# Patient Record
Sex: Female | Born: 1935 | Race: Black or African American | Hispanic: No | Smoking: Former smoker
Health system: Southern US, Community
[De-identification: ages and names within clinical notes are randomized; demographics above are authoritative.]

## PROBLEM LIST (undated history)

## (undated) DIAGNOSIS — K219 Gastro-esophageal reflux disease without esophagitis: Secondary | ICD-10-CM

## (undated) DIAGNOSIS — L8990 Pressure ulcer of unspecified site, unspecified stage: Secondary | ICD-10-CM

## (undated) DIAGNOSIS — Y92009 Unspecified place in unspecified non-institutional (private) residence as the place of occurrence of the external cause: Secondary | ICD-10-CM

## (undated) DIAGNOSIS — E119 Type 2 diabetes mellitus without complications: Secondary | ICD-10-CM

## (undated) DIAGNOSIS — K9189 Other postprocedural complications and disorders of digestive system: Secondary | ICD-10-CM

## (undated) DIAGNOSIS — E1165 Type 2 diabetes mellitus with hyperglycemia: Secondary | ICD-10-CM

## (undated) DIAGNOSIS — Z79899 Other long term (current) drug therapy: Secondary | ICD-10-CM

## (undated) DIAGNOSIS — K209 Esophagitis, unspecified: Secondary | ICD-10-CM

## (undated) DIAGNOSIS — B372 Candidiasis of skin and nail: Secondary | ICD-10-CM

## (undated) DIAGNOSIS — W19XXXA Unspecified fall, initial encounter: Secondary | ICD-10-CM

## (undated) DIAGNOSIS — I5032 Chronic diastolic (congestive) heart failure: Secondary | ICD-10-CM

## (undated) DIAGNOSIS — K567 Ileus, unspecified: Secondary | ICD-10-CM

## (undated) DIAGNOSIS — I10 Essential (primary) hypertension: Secondary | ICD-10-CM

## (undated) DIAGNOSIS — D500 Iron deficiency anemia secondary to blood loss (chronic): Secondary | ICD-10-CM

## (undated) DIAGNOSIS — R52 Pain, unspecified: Secondary | ICD-10-CM

## (undated) DIAGNOSIS — E785 Hyperlipidemia, unspecified: Secondary | ICD-10-CM

## (undated) DIAGNOSIS — Z7901 Long term (current) use of anticoagulants: Secondary | ICD-10-CM

## (undated) DIAGNOSIS — I4891 Unspecified atrial fibrillation: Secondary | ICD-10-CM

## (undated) DIAGNOSIS — N289 Disorder of kidney and ureter, unspecified: Secondary | ICD-10-CM

## (undated) DIAGNOSIS — E1129 Type 2 diabetes mellitus with other diabetic kidney complication: Secondary | ICD-10-CM

## (undated) DIAGNOSIS — I499 Cardiac arrhythmia, unspecified: Secondary | ICD-10-CM

## (undated) DIAGNOSIS — N184 Chronic kidney disease, stage 4 (severe): Secondary | ICD-10-CM

## (undated) DIAGNOSIS — K420 Umbilical hernia with obstruction, without gangrene: Secondary | ICD-10-CM

## (undated) HISTORY — DX: Umbilical hernia with obstruction, without gangrene: K42.0

## (undated) HISTORY — DX: Pressure ulcer of unspecified site, unspecified stage: L89.90

## (undated) HISTORY — DX: Chronic diastolic (congestive) heart failure: I50.32

## (undated) HISTORY — DX: Pain, unspecified: R52

## (undated) HISTORY — DX: Gastro-esophageal reflux disease without esophagitis: K21.9

## (undated) HISTORY — DX: Esophagitis, unspecified: K20.9

## (undated) HISTORY — DX: Candidiasis of skin and nail: B37.2

## (undated) HISTORY — PX: OTHER SURGICAL HISTORY: SHX169

## (undated) HISTORY — DX: Other long term (current) drug therapy: Z79.899

## (undated) HISTORY — DX: Iron deficiency anemia secondary to blood loss (chronic): D50.0

## (undated) HISTORY — DX: Unspecified place in unspecified non-institutional (private) residence as the place of occurrence of the external cause: Y92.009

## (undated) HISTORY — PX: HERNIA REPAIR: SHX51

## (undated) HISTORY — DX: Long term (current) use of anticoagulants: Z79.01

## (undated) HISTORY — DX: Essential (primary) hypertension: I10

## (undated) HISTORY — DX: Chronic kidney disease, stage 4 (severe): N18.4

## (undated) HISTORY — DX: Hyperlipidemia, unspecified: E78.5

## (undated) HISTORY — DX: Unspecified fall, initial encounter: W19.XXXA

## (undated) HISTORY — PX: CATARACT EXTRACTION, BILATERAL: SHX1313

## (undated) HISTORY — DX: Type 2 diabetes mellitus with other diabetic kidney complication: E11.29

## (undated) HISTORY — DX: Type 2 diabetes mellitus with hyperglycemia: E11.65

## (undated) HISTORY — DX: Unspecified atrial fibrillation: I48.91

---

## 1997-05-08 ENCOUNTER — Ambulatory Visit (HOSPITAL_COMMUNITY): Admission: RE | Admit: 1997-05-08 | Discharge: 1997-05-08 | Payer: Self-pay | Admitting: Family Medicine

## 1998-05-24 ENCOUNTER — Encounter: Payer: Self-pay | Admitting: Family Medicine

## 1998-05-24 ENCOUNTER — Ambulatory Visit (HOSPITAL_COMMUNITY): Admission: RE | Admit: 1998-05-24 | Discharge: 1998-05-24 | Payer: Self-pay | Admitting: Family Medicine

## 1998-07-03 ENCOUNTER — Other Ambulatory Visit: Admission: RE | Admit: 1998-07-03 | Discharge: 1998-07-03 | Payer: Self-pay | Admitting: Obstetrics and Gynecology

## 1999-01-03 ENCOUNTER — Ambulatory Visit: Admission: RE | Admit: 1999-01-03 | Discharge: 1999-01-03 | Payer: Self-pay | Admitting: Family Medicine

## 1999-03-25 HISTORY — PX: BREAST LUMPECTOMY: SHX2

## 1999-06-26 ENCOUNTER — Ambulatory Visit (HOSPITAL_COMMUNITY): Admission: RE | Admit: 1999-06-26 | Discharge: 1999-06-26 | Payer: Self-pay | Admitting: Family Medicine

## 1999-06-26 ENCOUNTER — Encounter: Payer: Self-pay | Admitting: Family Medicine

## 1999-07-01 ENCOUNTER — Encounter: Payer: Self-pay | Admitting: Family Medicine

## 1999-07-01 ENCOUNTER — Encounter: Admission: RE | Admit: 1999-07-01 | Discharge: 1999-07-01 | Payer: Self-pay | Admitting: Family Medicine

## 1999-08-06 ENCOUNTER — Encounter (INDEPENDENT_AMBULATORY_CARE_PROVIDER_SITE_OTHER): Payer: Self-pay | Admitting: *Deleted

## 1999-08-06 ENCOUNTER — Encounter: Admission: RE | Admit: 1999-08-06 | Discharge: 1999-08-06 | Payer: Self-pay | Admitting: General Surgery

## 1999-08-06 ENCOUNTER — Encounter (HOSPITAL_BASED_OUTPATIENT_CLINIC_OR_DEPARTMENT_OTHER): Payer: Self-pay | Admitting: General Surgery

## 1999-08-06 ENCOUNTER — Other Ambulatory Visit: Admission: RE | Admit: 1999-08-06 | Discharge: 1999-08-06 | Payer: Self-pay | Admitting: General Surgery

## 1999-08-20 ENCOUNTER — Encounter (HOSPITAL_BASED_OUTPATIENT_CLINIC_OR_DEPARTMENT_OTHER): Payer: Self-pay | Admitting: General Surgery

## 1999-08-22 ENCOUNTER — Ambulatory Visit (HOSPITAL_COMMUNITY): Admission: RE | Admit: 1999-08-22 | Discharge: 1999-08-22 | Payer: Self-pay | Admitting: General Surgery

## 1999-08-22 ENCOUNTER — Encounter (HOSPITAL_BASED_OUTPATIENT_CLINIC_OR_DEPARTMENT_OTHER): Payer: Self-pay | Admitting: General Surgery

## 1999-08-22 ENCOUNTER — Encounter (INDEPENDENT_AMBULATORY_CARE_PROVIDER_SITE_OTHER): Payer: Self-pay | Admitting: *Deleted

## 1999-10-02 ENCOUNTER — Encounter: Admission: RE | Admit: 1999-10-02 | Discharge: 1999-12-31 | Payer: Self-pay | Admitting: Radiation Oncology

## 2000-08-14 ENCOUNTER — Encounter (HOSPITAL_BASED_OUTPATIENT_CLINIC_OR_DEPARTMENT_OTHER): Payer: Self-pay | Admitting: General Surgery

## 2000-08-14 ENCOUNTER — Encounter: Admission: RE | Admit: 2000-08-14 | Discharge: 2000-08-14 | Payer: Self-pay | Admitting: General Surgery

## 2000-09-21 ENCOUNTER — Other Ambulatory Visit: Admission: RE | Admit: 2000-09-21 | Discharge: 2000-09-21 | Payer: Self-pay | Admitting: Obstetrics and Gynecology

## 2000-10-26 ENCOUNTER — Encounter (HOSPITAL_BASED_OUTPATIENT_CLINIC_OR_DEPARTMENT_OTHER): Payer: Self-pay | Admitting: General Surgery

## 2000-10-28 ENCOUNTER — Encounter (INDEPENDENT_AMBULATORY_CARE_PROVIDER_SITE_OTHER): Payer: Self-pay | Admitting: *Deleted

## 2000-10-28 ENCOUNTER — Ambulatory Visit (HOSPITAL_COMMUNITY): Admission: RE | Admit: 2000-10-28 | Discharge: 2000-10-28 | Payer: Self-pay | Admitting: General Surgery

## 2001-09-21 ENCOUNTER — Encounter (HOSPITAL_BASED_OUTPATIENT_CLINIC_OR_DEPARTMENT_OTHER): Payer: Self-pay | Admitting: General Surgery

## 2001-09-21 ENCOUNTER — Encounter: Admission: RE | Admit: 2001-09-21 | Discharge: 2001-09-21 | Payer: Self-pay | Admitting: General Surgery

## 2001-10-06 ENCOUNTER — Other Ambulatory Visit: Admission: RE | Admit: 2001-10-06 | Discharge: 2001-10-06 | Payer: Self-pay | Admitting: Obstetrics and Gynecology

## 2002-11-01 ENCOUNTER — Encounter (HOSPITAL_BASED_OUTPATIENT_CLINIC_OR_DEPARTMENT_OTHER): Payer: Self-pay | Admitting: General Surgery

## 2002-11-01 ENCOUNTER — Encounter: Admission: RE | Admit: 2002-11-01 | Discharge: 2002-11-01 | Payer: Self-pay | Admitting: General Surgery

## 2003-02-13 ENCOUNTER — Ambulatory Visit (HOSPITAL_COMMUNITY): Admission: RE | Admit: 2003-02-13 | Discharge: 2003-02-14 | Payer: Self-pay | Admitting: Gastroenterology

## 2003-02-14 ENCOUNTER — Encounter (INDEPENDENT_AMBULATORY_CARE_PROVIDER_SITE_OTHER): Payer: Self-pay | Admitting: *Deleted

## 2003-12-08 ENCOUNTER — Encounter: Admission: RE | Admit: 2003-12-08 | Discharge: 2003-12-08 | Payer: Self-pay | Admitting: General Surgery

## 2004-01-12 ENCOUNTER — Encounter (HOSPITAL_BASED_OUTPATIENT_CLINIC_OR_DEPARTMENT_OTHER): Admission: RE | Admit: 2004-01-12 | Discharge: 2004-01-23 | Payer: Self-pay | Admitting: Internal Medicine

## 2004-01-26 ENCOUNTER — Inpatient Hospital Stay (HOSPITAL_COMMUNITY): Admission: EM | Admit: 2004-01-26 | Discharge: 2004-01-29 | Payer: Self-pay | Admitting: Internal Medicine

## 2004-03-13 ENCOUNTER — Other Ambulatory Visit: Admission: RE | Admit: 2004-03-13 | Discharge: 2004-03-13 | Payer: Self-pay | Admitting: Obstetrics and Gynecology

## 2004-04-16 ENCOUNTER — Encounter (HOSPITAL_BASED_OUTPATIENT_CLINIC_OR_DEPARTMENT_OTHER): Admission: RE | Admit: 2004-04-16 | Discharge: 2004-05-08 | Payer: Self-pay | Admitting: Internal Medicine

## 2004-12-25 ENCOUNTER — Encounter: Admission: RE | Admit: 2004-12-25 | Discharge: 2004-12-25 | Payer: Self-pay | Admitting: Family Medicine

## 2005-12-29 ENCOUNTER — Encounter: Admission: RE | Admit: 2005-12-29 | Discharge: 2005-12-29 | Payer: Self-pay | Admitting: Family Medicine

## 2007-01-04 ENCOUNTER — Encounter: Admission: RE | Admit: 2007-01-04 | Discharge: 2007-01-04 | Payer: Self-pay | Admitting: General Surgery

## 2008-01-05 ENCOUNTER — Encounter: Admission: RE | Admit: 2008-01-05 | Discharge: 2008-01-05 | Payer: Self-pay | Admitting: Family Medicine

## 2008-06-22 ENCOUNTER — Encounter: Admission: RE | Admit: 2008-06-22 | Discharge: 2008-06-22 | Payer: Self-pay | Admitting: Gastroenterology

## 2008-09-15 ENCOUNTER — Ambulatory Visit (HOSPITAL_COMMUNITY): Admission: RE | Admit: 2008-09-15 | Discharge: 2008-09-15 | Payer: Self-pay | Admitting: Gastroenterology

## 2009-01-30 ENCOUNTER — Encounter: Admission: RE | Admit: 2009-01-30 | Discharge: 2009-01-30 | Payer: Self-pay | Admitting: Family Medicine

## 2010-02-05 ENCOUNTER — Encounter: Admission: RE | Admit: 2010-02-05 | Discharge: 2010-02-05 | Payer: Self-pay | Admitting: Family Medicine

## 2010-02-18 ENCOUNTER — Encounter: Admission: RE | Admit: 2010-02-18 | Discharge: 2010-02-18 | Payer: Self-pay | Admitting: Family Medicine

## 2010-08-09 NOTE — Discharge Summary (Signed)
[name], [name]                ACCOUNT NO.:  [id number]   MEDICAL RECORD NO.:  [id number]          PATIENT TYPE:  INP   LOCATION:  [id number]                         FACILITY:  [facility]   PHYSICIAN:  Annita Brod, M.D.DATE OF BIRTH:  [birth year]   DATE OF ADMISSION:  01/26/2004  DATE OF DISCHARGE:  01/29/2004                                 DISCHARGE SUMMARY   PRIMARY CARE PHYSICIAN:  Russ Halo. Sheryn Bison, M.D.   DISCHARGE DIAGNOSES:  1.  Cellulitis of the left foot and ankle.  2.  History of iron-deficiency anemia.  3.  History of left-sided diverticula.  4.  Hypertension.  5.  History of breast cancer.  6.  History of supraventricular tachycardia.  7.  History of diabetes.  8.  History of chronic obstructive pulmonary disease.   DISCHARGE MEDICATIONS:  Patient is being discharged on Augmentin 500 mg p.o.  b.i.d. x6 more days.  In addition, he is to continue all of his previous  medicines.  Precose 100 mg p.o. t.i.d.  Glucophage 500 mg 2 pills p.o.  t.i.d.  Glucotrol XL 10 mg p.o. daily.  Avandia 4 mg p.o. b.i.d.  Lipitor 40  mg p.o. daily.  Norvasc 10 mg p.o. daily.  Toprol daily.  K-Lor 20 mEq p.o.  b.i.d.  Lasix 80 mg p.o. b.i.d.  Evista 60 mg p.o. daily.  Aspirin 81 mg  p.o. daily.  Nexium 40 mg p.o. daily.  Gaviscon p.r.n.  He is also on a  multivitamin.   HOSPITAL COURSE:  Patient is a 77 year old female who presents with right  lower extremity swelling, fevers, chills, and redness for the past three  days.  Treatment with p.o. antibiotics was attempted but she showed no signs  of improvement and was admitted directly for treatment of inpatient IV  antibiotics.  By the second day, the patient was feeling better with her  foot propped up.  She had no pain.   Her initial white count on admission was only 10.6, but it decreased down to  8 by her third hospital day.  Her cellulitis was difficult to see, given her  dark skin, but she was started on IV Zosyn.  In addition, she  otherwise  remained stable and afebrile.  She had minimal tenderness, and by the third  day, she was doing well.  At that time, we got her out of bed and tried to  ambulate her.  She was able to ambulate relatively well.  She continued to  complain of some lower extremity swelling, and lower extremity Dopplers were  checked on January 29, 2004, which were negative for DVT.   By November 7, she was doing well, afebrile.  Her foot was still swollen but  less tender, and she was able to ambulate.  Once her Dopplers were done, it  was found to be negative, and she was discharged to home.  In regards to her  diabetes, her blood sugars were followed.  She is to continue on her p.o.  medications, although she remained n.p.o. during her hospital stay.  Blood  sugars were slightly elevated  but stayed under 200.  In regards to her  hypertension, this remained stable as well.  Chest x-ray was clear, as was  the lung exam, so it was felt that she did not have any CHF and no echo was  ordered, as originally planned in her H&P.  Patient was discharged on  Augmentin 500 mg p.o. b.i.d. x6 more days on top of the IV Zosyn she  received for three days.   PLAN:  Activity as tolerated.  She will be discharged on a low sodium  diabetic diet.  Patient will follow up with Dr. Sheryn Bison in one week to see  if she is having improvement.  If not, she may need to be restarted on IV  antibiotics at this time for a continuous treatment of antibiotics alone.     Send   SKK/MEDQ  D:  02/23/2004  T:  02/25/2004  Job:  [id number]

## 2010-08-09 NOTE — Op Note (Signed)
. [facility]  Patient:    [name], [name]                       MRN: [id number] Proc. Date: 08/22/99 Adm. Date:  [id number] Disc. Date: [id number] Attending:  Sherolyn Buba CC:         Candee Furbish. Bubba Camp, M.D. (2)                           Operative Report  PREOPERATIVE DIAGNOSIS:  Carcinoma, right breast.  POSTOPERATIVE DIAGNOSIS:  Carcinoma, right breast.  PROCEDURE:  Lumpectomy with sentinel lymph node dissection.  SURGEON:  Candee Furbish. Bubba Camp, M.D.  ASSISTANT:  Courtney Paris, PA-student.  ANESTHESIA:  General  INDICATIONS:  This patient is a 77 year old woman presenting with an abnormal lesion on her right mammogram. 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  The patient was prepared, brought to the operating room after a needle localization had been done, and the site injected with radioactive dye.  DESCRIPTION OF PROCEDURE:  Following the induction of satisfactory anesthesia, he patient was positioned supinely and the right breast was injected with 10 cc of  Lymphazurin and massaged for approximately 5-8 minutes.  The breast was prepped and draped to be included in the sterile operative field.  I made an elliptical incision around the localizing needle, deepened this through skin and subcutaneous tissues, taking a wide conical biopsy all the way down to the pectoralis major muscle. The tissue was removed in its entirely in four different pathologic evaluation.  Touch preps on the margins showed no evidence of carcinoma.  Then using the Neoprobe, I located a high count spot within the axilla; this was located just approximately at the edge of the axillary hairline. A transverse axillary incision was made, deepened through the skin and subcutaneous tissue, acute dissection carried down with direction being guided by the direction of the neoprobe.   Blue lymphatics were traced down to two large blue lymph nodes which  were dissected free and noted to have high counts, approximating 1200 counts/second.  This was removed in four different pathologic evaluations. Touch preps on these two sentinel nodes showed no evidence of carcinoma.  The wounds ere then checked for hemostasis and noted to be dry.  Sponge, instrument and sharp counts were verified.  In the axilla and in the breast wound, the subcutaneous tissues were closed with interrupted sutures of 3-0 Vicryl and both wounds were  closed with a running subcuticular stitch of 4-0 Monocryl.  The wounds were reinforced with Steri-Strips, sterile dressings applied, anesthetic reversed. The patient was removed from the operating room to the recovery room in stable condition, having tolerated the procedure well. DD:  08/22/99 TD:  08/26/99 Job: [id number]

## 2010-08-09 NOTE — Consult Note (Signed)
NAME:  [name], [name] NO.:  [id number]   MEDICAL RECORD NO.:  [id number]                   PATIENT TYPE:  OIB   LOCATION:  3705                                 FACILITY:  [facility]   PHYSICIAN:  Sinclair Grooms, M.D.            DATE OF BIRTH:  08-17-1933   DATE OF CONSULTATION:  DATE OF DISCHARGE:                                   CONSULTATION   REASON FOR CONSULTATION:  Tachycardia.   CONCLUSION:  1. Supraventricular tachycardia secondary to withholding beta blocker     therapy and intrinsic tendency for SVT in this patient. She has a prior     history of such.  2. Hypotension secondary to tachycardia and bowel prep for a colonoscopy.  3. History of hypertension.  4. Chest discomfort, dyspnea, diaphoresis, secondary to number one and     number two above.   PLAN:  1. IV Adenocard 6 mg given with reversion of the rhythm to normal sinus     rhythm.  2. Immediately resume the patient's beta blocker, Toprol XL 100 mg per day.  3. Check electrolytes and cardiac enzymes.  4. If all above remain stable and are treated it is okay to proceed with     endoscopy within the next 24 hours.   COMMENTS:  The patient is 89. I have known her for a number of years and  have treated her for her supraventricular tachycardia. She has hypertensive  heart disease. She has no known history of coronary artery disease. Walked  into the hospital this morning in preparation for a colonoscopy and upper GI  endoscopy. She suddenly developed weakness and tachy palpitations associated  with chest discomfort. Upon arriving in the endoscopy suite her heart rate  was greater than 160 and EKG demonstrated PSVT. In speaking with the patient  she has been doing her endoscopy/colonoscopy prep for the last 24 hours and  did not have her medications last evening or this morning. She has had  similar sensation/symptomatology in the past when she has had PSVT.   Her current medical  regimen includes Precose, Doxazosin, Evista, Fosamax,  Nexium, Lipitor, Nifedipine, Glucotrol, Avandia, Toprol XL 100 mg per day,  Gaviscon, Chlorocon, Furosemide, and aspirin.   PHYSICAL EXAMINATION:  VITAL SIGNS:  Blood pressure 90/60, heart rate 170.  NECK:  No carotid bruit.  CHEST:  Clear.  CARDIAC:  No murmur, no gallop, tachy rhythm.  EXTREMITIES:  No edema.  NEUROLOGIC:  The patient was alert and oriented.  SKIN:  Diaphoretic.   LABORATORY DATA:  EKG reveals PSVT with marked ST-T abnormality, heart rate  of 170.   PROCEDURE:  Initially carotid massage on the right was attempted. This  slightly decreased her heart rate, but did not break the arrhythmia. We then  administered 6 mg of Adenocard IV with reversion to normal sinus rhythm  within  3 minutes of the bolus. There were  initial PVCs with some junctional escape.  The patient was asymptomatic with this. The ST-T abnormalities noted on the  EKG when the was in tachycardia completely resolved. Laboratory data  including electrolytes are cardiac enzymes are pending.                                               Sinclair Grooms, M.D.    HWS/MEDQ  D:  02/14/2003  T:  02/14/2003  Job:  [id number]   cc:   Jeryl Columbia, M.D.  [address] [place]  [place] [address]  Fax: [phone]

## 2010-08-09 NOTE — Op Note (Signed)
NAME:  [name], [name]                          ACCOUNT NO.:  [id number]   MEDICAL RECORD NO.:  [id number]                   PATIENT TYPE:  OIB   LOCATION:  3705                                 FACILITY:  [facility]   PHYSICIAN:  Jeryl Columbia, M.D.                 DATE OF BIRTH:  Apr 22, 1933   DATE OF PROCEDURE:  02/14/2003  DATE OF DISCHARGE:                                 OPERATIVE REPORT   PROCEDURE PERFORMED:  Colonoscopy.   ENDOSCOPIST:  Jeryl Columbia, M.D.   INDICATIONS FOR PROCEDURE:  Patient with iron deficiency anemia.  Consent  was signed after the risks, benefits, methods and options were thoroughly  discussed in the office.   MEDICINES USED:  Demerol 70 mg, Versed 7 mg.   DESCRIPTION OF PROCEDURE:  Rectal inspection was pertinent for external  hemorrhoids, small.  Digital exam was negative.  A video pediatric  adjustable colonoscope was inserted and with lots of difficulty due to a  long looping colon and only a fair prep was finally able to be advanced to  the cecum.  This did require rolling her on her back and then on her right  side and then back to her left side again, various abdominal pressures.  On  insertion, left-sided diverticula were seen.  The prep was only fair.  We  frequently clogged the scope and dirtied the lens and had to spend a great  deal of time cleaning those off.  The cecum was identified by the  appendiceal orifice and the ileocecal valve. There was some stool adherent  to the wall, more on the right than on the left, but no underlying obvious  large lesions.  Certainly small polyps with this prep could have been  missed.  The scope was slowly withdrawn.  With lots of washing and  suctioning, other than the left-sided diverticula, no signs of bleeding or  other abnormalities were seen as we slowly withdrew back to the rectum.  Anorectal pullthrough and retroflexion confirmed the hemorrhoids.  Some were  a little inflamed from the procedure.  Scope  was reinserted a short ways up  the left side of the colon, air was suctioned, scope removed.  The patient  tolerated the procedure well.  There was no immediate obvious complication.   ENDOSCOPIC DIAGNOSIS:  1. Internal and external hemorrhoids.  2. Left-sided diverticula.  3. Fair prep, long looping colon.  4. Otherwise within normal limits to the cecum; however, with a fair prep,     small lesions could be missed.   PLAN:  More aggressive prep next time, probably increase her potassium as  well.  Probably use a regular scope.  Continue work-up with an EGD.  Jeryl Columbia, M.D.    MEM/MEDQ  D:  02/14/2003  T:  02/14/2003  Job:  [id number]   cc:   Sinclair Grooms, M.D.  [address] [place] [employer]  [place]  [place] [address]  Fax: [place]. Sheryn Bison, M.D.  [address]  [place]  [place] [address]  Fax: [phone]

## 2010-08-09 NOTE — Op Note (Signed)
NAME:  DERICK, AVITIA                          ACCOUNT NO.:  [id number]   MEDICAL RECORD NO.:  [id number]                   PATIENT TYPE:  OIB   LOCATION:  3705                                 FACILITY:  [facility]   PHYSICIAN:  Jeryl Columbia, M.D.                 DATE OF BIRTH:  [birth year]   DATE OF PROCEDURE:  02/14/2003  DATE OF DISCHARGE:                                 OPERATIVE REPORT   PROCEDURE:  Esophagogastroduodenoscopy with biopsy.   ENDOSCOPIST:  Jeryl Columbia, M.D.   INDICATIONS:  Upper tract symptoms, iron deficiency, nondiagnostic  colonoscopy.   INFORMED CONSENT:  Consent was signed prior to any premedications given  after risks, benefits, methods and options were thoroughly discussed in the  office.   MEDICATIONS USED:  Additional medicines for this procedure were Demerol 30  mg, Versed 3 mg.   DESCRIPTION OF PROCEDURE:  The video endoscope was inserted by direct  vision.  The esophagus were normal.  The scope was passed into the stomach  and into a normal antrum and normal pylorus to a normal duodenal bulb around  the C-loop to the second portion of the duodenum where some minimal  duodenitis was seen and a few biopsies were obtained.  The scope was  withdrawn back to the bulb which was normal.  The scope was withdrawn back  to the stomach and retroflexed.  Cardia, fundus, angularis, lesser and  greater curve were all normal.  Straight visualization of the stomach was  normal.  The scope was then slowly withdrawn after air was suctioned.  She  did have a tiny hiatal hernia but no other abnormalities.  The scope was  removed.  The patient tolerated the procedure well.  There was no obvious  immediate complication.   ENDOSCOPIC DIAGNOSES:  1. Tiny hiatal hernia.  2. Minimal duodenitis status post biopsy.  3. Otherwise normal esophagogastroduodenoscopy.   PLAN:  1. Await pathology to rule out malabsorption.  2. Continue pump inhibitors.  3. Follow up in 6-8  weeks to recheck symptoms, CBC, guaiacs and make sure no     other workup plans are needed.  4. Will suggest one over-the-counter iron a day in the meantime.  5. Call me sooner p.r.n.                                               Jeryl Columbia, M.D.    MEM/MEDQ  D:  02/14/2003  T:  02/14/2003  Job:  [id number]

## 2010-08-09 NOTE — Op Note (Signed)
[facility]  Patient:    RUQAYYAH, CORBIT                       MRN: [id number] Proc. Date: 10/28/00 Adm. Date:  [id number] Disc. Date: [id number] Attending:  Sherolyn Buba                           Operative Report  PREOPERATIVE DIAGNOSIS:  Incarcerated umbilical hernia.  POSTOPERATIVE DIAGNOSIS:  Incarcerated umbilical hernia.  OPERATION PERFORMED:  Repair of incarcerated umbilical hernia with mesh.  SURGEON:  Candee Furbish. Bubba Camp, M.D.  ASSISTANT:  Maia Plan. Lindon Romp, M.D.  ANESTHESIA:  General.  INDICATIONS FOR PROCEDURE:  The patient is a 77 year old female presenting with a painful large incarcerated mass within her umbilicus. She is brought to the operating room now after risks and benefits of surgery had been discussed and she gives consent.  DESCRIPTION OF PROCEDURE:  Following the induction of satisfactory anesthesia with the patient positioned supinely, the abdomen was routinely prepped and draped to be included in the sterile operative field.  A superumbilical curvilinear incision was made, deepened through the skin and subcutaneous tissues down through the hernial sac.  The hernia sac was dissected free from the overlying umbilical skin carrying the dissection down to the fascia.  The hernia was in fact bilobar with a fascial bridge across it which was taken down.  The hernia sac was opened and the incarcerated content reduced after it was inspected and noted not to be ischemic.  The sac was then amputated in its entirety and forwarded for pathologic evaluation.  The large defect was then closed with a medium mesh plug which was sewn in in the interrupted fashion around the entire fascial edge.  Repair was noted to be intact.  Sponge, instrument and sharp counts were verified.  Subcutaneous tissues were closed over the mesh with interrupted 2-0 Vicryl sutures.  The umbilical skin was tacked down to the base of the wound and was  again closed with a running 5-0 Monocryl suture and then reinforced with Steri-Strips.  Sterile dressings applied.  Anesthetic reversed.  Patient removed from the operating room to the recovery room in stable condition having tolerated the procedure well. DD:  10/28/00 TD:  10/28/00 Job: [id number]

## 2010-11-15 ENCOUNTER — Emergency Department (HOSPITAL_COMMUNITY)
Admission: EM | Admit: 2010-11-15 | Discharge: 2010-11-15 | Disposition: A | Discharge status: Home / Self Care | Payer: Medicare Other | Attending: Emergency Medicine | Admitting: Emergency Medicine

## 2010-11-15 ENCOUNTER — Other Ambulatory Visit: Payer: Self-pay | Admitting: Family Medicine

## 2010-11-15 DIAGNOSIS — E119 Type 2 diabetes mellitus without complications: Secondary | ICD-10-CM | POA: Insufficient documentation

## 2010-11-15 DIAGNOSIS — M7989 Other specified soft tissue disorders: Secondary | ICD-10-CM | POA: Insufficient documentation

## 2010-11-15 DIAGNOSIS — I499 Cardiac arrhythmia, unspecified: Secondary | ICD-10-CM | POA: Insufficient documentation

## 2010-11-15 DIAGNOSIS — I4891 Unspecified atrial fibrillation: Secondary | ICD-10-CM | POA: Insufficient documentation

## 2010-11-15 DIAGNOSIS — K219 Gastro-esophageal reflux disease without esophagitis: Secondary | ICD-10-CM | POA: Insufficient documentation

## 2010-11-15 DIAGNOSIS — R609 Edema, unspecified: Secondary | ICD-10-CM | POA: Insufficient documentation

## 2010-11-15 DIAGNOSIS — I10 Essential (primary) hypertension: Secondary | ICD-10-CM | POA: Insufficient documentation

## 2010-11-15 DIAGNOSIS — Z7901 Long term (current) use of anticoagulants: Secondary | ICD-10-CM | POA: Insufficient documentation

## 2010-11-15 DIAGNOSIS — N289 Disorder of kidney and ureter, unspecified: Secondary | ICD-10-CM | POA: Insufficient documentation

## 2010-11-15 LAB — POCT I-STAT, CHEM 8
BUN: 30 mg/dL — ABNORMAL HIGH (ref 6–23)
Calcium, Ion: 1.19 mmol/L (ref 1.12–1.32)
Chloride: 104 mEq/L (ref 96–112)
Creatinine, Ser: 1.7 mg/dL — ABNORMAL HIGH (ref 0.50–1.10)
Glucose, Bld: 141 mg/dL — ABNORMAL HIGH (ref 70–99)
HCT: 38 % (ref 36.0–46.0)
Hemoglobin: 12.9 g/dL (ref 12.0–15.0)
Potassium: 4.3 mEq/L (ref 3.5–5.1)
Sodium: 140 mEq/L (ref 135–145)
TCO2: 28 mmol/L (ref 0–100)

## 2011-02-17 ENCOUNTER — Other Ambulatory Visit: Payer: Self-pay | Admitting: Family Medicine

## 2011-02-17 DIAGNOSIS — Z1231 Encounter for screening mammogram for malignant neoplasm of breast: Secondary | ICD-10-CM

## 2011-03-27 ENCOUNTER — Ambulatory Visit
Admission: RE | Admit: 2011-03-27 | Discharge: 2011-03-27 | Disposition: A | Discharge status: Home / Self Care | Payer: Medicare Other | Source: Ambulatory Visit | Attending: Family Medicine | Admitting: Family Medicine

## 2011-03-27 DIAGNOSIS — Z1231 Encounter for screening mammogram for malignant neoplasm of breast: Secondary | ICD-10-CM

## 2012-03-18 ENCOUNTER — Other Ambulatory Visit: Payer: Self-pay | Admitting: Family Medicine

## 2012-03-18 DIAGNOSIS — Z9889 Other specified postprocedural states: Secondary | ICD-10-CM

## 2012-03-18 DIAGNOSIS — Z853 Personal history of malignant neoplasm of breast: Secondary | ICD-10-CM

## 2012-03-18 DIAGNOSIS — Z1231 Encounter for screening mammogram for malignant neoplasm of breast: Secondary | ICD-10-CM

## 2012-04-22 ENCOUNTER — Ambulatory Visit: Payer: Medicare Other

## 2012-05-20 ENCOUNTER — Ambulatory Visit
Admission: RE | Admit: 2012-05-20 | Discharge: 2012-05-20 | Disposition: A | Discharge status: Home / Self Care | Payer: Medicare Other | Source: Ambulatory Visit | Attending: Family Medicine | Admitting: Family Medicine

## 2012-05-20 DIAGNOSIS — Z1231 Encounter for screening mammogram for malignant neoplasm of breast: Secondary | ICD-10-CM

## 2012-05-20 DIAGNOSIS — Z9889 Other specified postprocedural states: Secondary | ICD-10-CM

## 2012-05-20 DIAGNOSIS — Z853 Personal history of malignant neoplasm of breast: Secondary | ICD-10-CM

## 2012-11-26 ENCOUNTER — Encounter (HOSPITAL_BASED_OUTPATIENT_CLINIC_OR_DEPARTMENT_OTHER): Payer: Medicare Other | Attending: General Surgery

## 2012-11-26 DIAGNOSIS — L02419 Cutaneous abscess of limb, unspecified: Secondary | ICD-10-CM | POA: Insufficient documentation

## 2012-11-26 DIAGNOSIS — I4891 Unspecified atrial fibrillation: Secondary | ICD-10-CM | POA: Insufficient documentation

## 2012-11-26 DIAGNOSIS — Z923 Personal history of irradiation: Secondary | ICD-10-CM | POA: Insufficient documentation

## 2012-11-26 DIAGNOSIS — K219 Gastro-esophageal reflux disease without esophagitis: Secondary | ICD-10-CM | POA: Insufficient documentation

## 2012-11-26 DIAGNOSIS — I872 Venous insufficiency (chronic) (peripheral): Secondary | ICD-10-CM | POA: Insufficient documentation

## 2012-11-26 DIAGNOSIS — E119 Type 2 diabetes mellitus without complications: Secondary | ICD-10-CM | POA: Insufficient documentation

## 2012-11-26 DIAGNOSIS — E669 Obesity, unspecified: Secondary | ICD-10-CM | POA: Insufficient documentation

## 2012-11-26 DIAGNOSIS — I10 Essential (primary) hypertension: Secondary | ICD-10-CM | POA: Insufficient documentation

## 2012-11-26 DIAGNOSIS — Z7901 Long term (current) use of anticoagulants: Secondary | ICD-10-CM | POA: Insufficient documentation

## 2012-11-26 DIAGNOSIS — Z79899 Other long term (current) drug therapy: Secondary | ICD-10-CM | POA: Insufficient documentation

## 2012-11-26 NOTE — Progress Notes (Signed)
[facility]  NAME:  [name], [name] NO.:  [id number]  MEDICAL RECORD NO.:  [id number]      DATE OF BIRTH:  09/14/1933  PHYSICIAN:  Judene Companion, M.D.           VISIT DATE:                                  OFFICE VISIT   HISTORY OF PRESENT ILLNESS:  This is a 79 year old African American female, who comes to [place] because of a swollen left leg.  She was recently in the hospital where they treated her with an Unna boot and with Lasix. She has a history of chronic venous hypertension and chronic cellulitis of the left leg in the past.  She is a diabetic.  She has atrial fibrillation.  She is quite obese.  She has a history of congestive heart failure and hypertension and also has a history of reflux esophagitis.  She has had right breast lumpectomy followed by radiation in 1999.  Her medicines include Lasix, potassium, Coumadin 5 mg a day, amiodarone, Januvia, metoprolol, fluticasone, glipizide, Lipitor, Nexium, iron, calcium, and Flonase.  She was noted today to have a temperature of 97.  She had a pulse of 80.  Her blood pressure is 140/68, her pulse is 54.  She weighed 251 pounds.  She is 5 feet 6 inches.  We wrapped her in an Unna boot on the left leg and we will see her back in a week.  I could feel a pulse in her left foot, although was very edematous and her doctor has just increased the amount of Lasix she is taking a day from 40-80.  DIAGNOSES: 1. Venous stasis with venous stasis cellulitis of the left leg. 2. Hypertension. 3. Atrial fibrillation. 4. Obesity. 5. Hypertension. 6. Esophageal reflux. 7. She also has type 2 diabetes.     Judene Companion, M.D.     PP/MEDQ  D:  11/26/2012  T:  11/26/2012  Job:  [id number]

## 2012-12-09 ENCOUNTER — Encounter (HOSPITAL_COMMUNITY): Payer: Self-pay | Admitting: Emergency Medicine

## 2012-12-09 ENCOUNTER — Inpatient Hospital Stay (HOSPITAL_COMMUNITY)
Admission: EM | Admit: 2012-12-09 | Discharge: 2012-12-21 | DRG: 335 | Disposition: A | Discharge status: Skilled Nursing Facility | Payer: Medicare Other | Attending: Internal Medicine | Admitting: Internal Medicine

## 2012-12-09 ENCOUNTER — Emergency Department (HOSPITAL_COMMUNITY): Payer: Medicare Other

## 2012-12-09 DIAGNOSIS — Z7901 Long term (current) use of anticoagulants: Secondary | ICD-10-CM

## 2012-12-09 DIAGNOSIS — I10 Essential (primary) hypertension: Secondary | ICD-10-CM | POA: Diagnosis present

## 2012-12-09 DIAGNOSIS — N183 Chronic kidney disease, stage 3 unspecified: Secondary | ICD-10-CM

## 2012-12-09 DIAGNOSIS — R0789 Other chest pain: Secondary | ICD-10-CM | POA: Diagnosis not present

## 2012-12-09 DIAGNOSIS — K56609 Unspecified intestinal obstruction, unspecified as to partial versus complete obstruction: Secondary | ICD-10-CM | POA: Diagnosis present

## 2012-12-09 DIAGNOSIS — N058 Unspecified nephritic syndrome with other morphologic changes: Secondary | ICD-10-CM | POA: Diagnosis present

## 2012-12-09 DIAGNOSIS — I4891 Unspecified atrial fibrillation: Secondary | ICD-10-CM

## 2012-12-09 DIAGNOSIS — Z87891 Personal history of nicotine dependence: Secondary | ICD-10-CM

## 2012-12-09 DIAGNOSIS — Z853 Personal history of malignant neoplasm of breast: Secondary | ICD-10-CM

## 2012-12-09 DIAGNOSIS — E785 Hyperlipidemia, unspecified: Secondary | ICD-10-CM | POA: Diagnosis present

## 2012-12-09 DIAGNOSIS — I129 Hypertensive chronic kidney disease with stage 1 through stage 4 chronic kidney disease, or unspecified chronic kidney disease: Secondary | ICD-10-CM | POA: Diagnosis present

## 2012-12-09 DIAGNOSIS — K430 Incisional hernia with obstruction, without gangrene: Principal | ICD-10-CM | POA: Diagnosis present

## 2012-12-09 DIAGNOSIS — Z01810 Encounter for preprocedural cardiovascular examination: Secondary | ICD-10-CM

## 2012-12-09 DIAGNOSIS — K565 Intestinal adhesions [bands], unspecified as to partial versus complete obstruction: Secondary | ICD-10-CM | POA: Diagnosis present

## 2012-12-09 DIAGNOSIS — K420 Umbilical hernia with obstruction, without gangrene: Secondary | ICD-10-CM | POA: Diagnosis present

## 2012-12-09 DIAGNOSIS — E119 Type 2 diabetes mellitus without complications: Secondary | ICD-10-CM

## 2012-12-09 DIAGNOSIS — K567 Ileus, unspecified: Secondary | ICD-10-CM | POA: Clinically undetermined

## 2012-12-09 DIAGNOSIS — Z6838 Body mass index (BMI) 38.0-38.9, adult: Secondary | ICD-10-CM

## 2012-12-09 DIAGNOSIS — E1129 Type 2 diabetes mellitus with other diabetic kidney complication: Secondary | ICD-10-CM | POA: Diagnosis present

## 2012-12-09 DIAGNOSIS — D649 Anemia, unspecified: Secondary | ICD-10-CM | POA: Diagnosis present

## 2012-12-09 DIAGNOSIS — N184 Chronic kidney disease, stage 4 (severe): Secondary | ICD-10-CM | POA: Diagnosis present

## 2012-12-09 DIAGNOSIS — E870 Hyperosmolality and hypernatremia: Secondary | ICD-10-CM | POA: Diagnosis present

## 2012-12-09 DIAGNOSIS — K560 Paralytic ileus: Secondary | ICD-10-CM | POA: Diagnosis not present

## 2012-12-09 DIAGNOSIS — J810 Acute pulmonary edema: Secondary | ICD-10-CM | POA: Diagnosis not present

## 2012-12-09 HISTORY — DX: Other postprocedural complications and disorders of digestive system: K91.89

## 2012-12-09 HISTORY — DX: Essential (primary) hypertension: I10

## 2012-12-09 HISTORY — DX: Type 2 diabetes mellitus without complications: E11.9

## 2012-12-09 HISTORY — DX: Ileus, unspecified: K56.7

## 2012-12-09 HISTORY — DX: Hyperlipidemia, unspecified: E78.5

## 2012-12-09 HISTORY — DX: Disorder of kidney and ureter, unspecified: N28.9

## 2012-12-09 HISTORY — DX: Cardiac arrhythmia, unspecified: I49.9

## 2012-12-09 HISTORY — DX: Chronic kidney disease, stage 4 (severe): N18.4

## 2012-12-09 HISTORY — DX: Unspecified atrial fibrillation: I48.91

## 2012-12-09 LAB — CBC WITH DIFFERENTIAL/PLATELET
Basophils Absolute: 0 10*3/uL (ref 0.0–0.1)
Basophils Relative: 0 % (ref 0–1)
Eosinophils Absolute: 0.1 10*3/uL (ref 0.0–0.7)
Eosinophils Relative: 1 % (ref 0–5)
HCT: 45.3 % (ref 36.0–46.0)
Hemoglobin: 15.4 g/dL — ABNORMAL HIGH (ref 12.0–15.0)
Lymphocytes Relative: 13 % (ref 12–46)
Lymphs Abs: 1.3 10*3/uL (ref 0.7–4.0)
MCH: 26.5 pg (ref 26.0–34.0)
MCHC: 34 g/dL (ref 30.0–36.0)
MCV: 77.8 fL — ABNORMAL LOW (ref 78.0–100.0)
Monocytes Absolute: 1.1 10*3/uL — ABNORMAL HIGH (ref 0.1–1.0)
Monocytes Relative: 11 % (ref 3–12)
Neutro Abs: 7.3 10*3/uL (ref 1.7–7.7)
Neutrophils Relative %: 75 % (ref 43–77)
Platelets: 193 10*3/uL (ref 150–400)
RBC: 5.82 MIL/uL — ABNORMAL HIGH (ref 3.87–5.11)
RDW: 13.2 % (ref 11.5–15.5)
WBC: 9.7 10*3/uL (ref 4.0–10.5)

## 2012-12-09 LAB — URINALYSIS, ROUTINE W REFLEX MICROSCOPIC
Glucose, UA: NEGATIVE mg/dL
Leukocytes, UA: NEGATIVE
pH: 5.5 (ref 5.0–8.0)

## 2012-12-09 LAB — COMPREHENSIVE METABOLIC PANEL
ALT: 28 U/L (ref 0–35)
AST: 25 U/L (ref 0–37)
Albumin: 3.2 g/dL — ABNORMAL LOW (ref 3.5–5.2)
Alkaline Phosphatase: 45 U/L (ref 39–117)
BUN: 26 mg/dL — ABNORMAL HIGH (ref 6–23)
CO2: 28 mEq/L (ref 19–32)
Calcium: 9.5 mg/dL (ref 8.4–10.5)
Chloride: 99 mEq/L (ref 96–112)
Creatinine, Ser: 1.46 mg/dL — ABNORMAL HIGH (ref 0.50–1.10)
GFR calc Af Amer: 39 mL/min — ABNORMAL LOW (ref >90)
GFR calc non Af Amer: 33 mL/min — ABNORMAL LOW (ref >90)
Glucose, Bld: 203 mg/dL — ABNORMAL HIGH (ref 70–99)
Potassium: 3.5 mEq/L (ref 3.5–5.1)
Sodium: 138 mEq/L (ref 135–145)
Total Bilirubin: 0.9 mg/dL (ref 0.3–1.2)
Total Protein: 7 g/dL (ref 6.0–8.3)

## 2012-12-09 LAB — URINE MICROSCOPIC-ADD ON

## 2012-12-09 MED ORDER — ALUM & MAG HYDROXIDE-SIMETH 200-200-20 MG/5ML PO SUSP: 15.0000 mL | Freq: Once | ORAL | Status: DC

## 2012-12-09 MED ORDER — METOPROLOL TARTRATE 50 MG PO TABS
50.0000 mg | ORAL_TABLET | Freq: Two times a day (BID) | ORAL | Status: DC
  Administered 2012-12-09 – 2012-12-10 (×2): 50 mg via ORAL
  Filled 2012-12-09 (×3): qty 1

## 2012-12-09 MED ORDER — POTASSIUM CHLORIDE CRYS ER 10 MEQ PO TBCR
10.0000 meq | EXTENDED_RELEASE_TABLET | Freq: Two times a day (BID) | ORAL | Status: DC
  Administered 2012-12-09 – 2012-12-10 (×2): 10 meq via ORAL
  Filled 2012-12-09 (×3): qty 1

## 2012-12-09 MED ORDER — PATIENT'S GUIDE TO USING COUMADIN BOOK
Freq: Once | Status: AC
  Administered 2012-12-10: 1
  Filled 2012-12-09: qty 1

## 2012-12-09 MED ORDER — WARFARIN - PHARMACIST DOSING INPATIENT: Freq: Every day | Status: DC

## 2012-12-09 MED ORDER — ATORVASTATIN CALCIUM 40 MG PO TABS
40.0000 mg | ORAL_TABLET | Freq: Every day | ORAL | Status: DC
  Administered 2012-12-09: 40 mg via ORAL
  Filled 2012-12-09 (×2): qty 1

## 2012-12-09 MED ORDER — SODIUM CHLORIDE 0.9 % IV SOLN: INTRAVENOUS | Status: DC

## 2012-12-09 MED ORDER — FUROSEMIDE 80 MG PO TABS
80.0000 mg | ORAL_TABLET | Freq: Three times a day (TID) | ORAL | Status: DC
  Administered 2012-12-09 – 2012-12-10 (×3): 80 mg via ORAL
  Filled 2012-12-09 (×5): qty 1

## 2012-12-09 MED ORDER — WARFARIN SODIUM 5 MG PO TABS: 5.0000 mg | ORAL_TABLET | Freq: Every day | ORAL | Status: DC

## 2012-12-09 MED ORDER — DOCUSATE SODIUM 100 MG PO CAPS
100.0000 mg | ORAL_CAPSULE | Freq: Two times a day (BID) | ORAL | Status: DC
  Administered 2012-12-09 – 2012-12-10 (×3): 100 mg via ORAL
  Filled 2012-12-09 (×3): qty 1

## 2012-12-09 MED ORDER — FLUTICASONE PROPIONATE 50 MCG/ACT NA SUSP
2.0000 | Freq: Every day | NASAL | Status: DC | PRN
  Filled 2012-12-09: qty 16

## 2012-12-09 MED ORDER — AMIODARONE HCL 200 MG PO TABS
200.0000 mg | ORAL_TABLET | Freq: Every day | ORAL | Status: DC
  Administered 2012-12-09 – 2012-12-21 (×13): 200 mg via ORAL
  Filled 2012-12-09 (×13): qty 1

## 2012-12-09 MED ORDER — LINAGLIPTIN 5 MG PO TABS
5.0000 mg | ORAL_TABLET | Freq: Every day | ORAL | Status: DC
  Administered 2012-12-10: 5 mg via ORAL
  Filled 2012-12-09: qty 1

## 2012-12-09 MED ORDER — ONDANSETRON HCL 4 MG PO TABS: 4.0000 mg | ORAL_TABLET | Freq: Four times a day (QID) | ORAL | Status: DC | PRN

## 2012-12-09 MED ORDER — WARFARIN VIDEO: Freq: Once | Status: DC

## 2012-12-09 MED ORDER — ONDANSETRON HCL 4 MG/2ML IJ SOLN: 4.0000 mg | Freq: Three times a day (TID) | INTRAMUSCULAR | Status: AC | PRN

## 2012-12-09 MED ORDER — ACETAMINOPHEN 650 MG RE SUPP: 650.0000 mg | Freq: Four times a day (QID) | RECTAL | Status: DC | PRN

## 2012-12-09 MED ORDER — GLIPIZIDE ER 10 MG PO TB24
10.0000 mg | ORAL_TABLET | Freq: Every day | ORAL | Status: DC
  Administered 2012-12-10: 10 mg via ORAL
  Filled 2012-12-09 (×2): qty 1

## 2012-12-09 MED ORDER — ACETAMINOPHEN 325 MG PO TABS: 650.0000 mg | ORAL_TABLET | Freq: Four times a day (QID) | ORAL | Status: DC | PRN

## 2012-12-09 MED ORDER — ECONAZOLE NITRATE 1 % EX CREA: 1.0000 "application " | TOPICAL_CREAM | Freq: Every day | CUTANEOUS | Status: DC | PRN

## 2012-12-09 MED ORDER — METOLAZONE 5 MG PO TABS
5.0000 mg | ORAL_TABLET | ORAL | Status: DC
  Filled 2012-12-09 (×2): qty 1

## 2012-12-09 MED ORDER — WARFARIN SODIUM 6 MG PO TABS
6.0000 mg | ORAL_TABLET | Freq: Once | ORAL | Status: AC
  Administered 2012-12-09: 6 mg via ORAL
  Filled 2012-12-09: qty 1

## 2012-12-09 MED ORDER — SODIUM CHLORIDE 0.9 % IV SOLN
INTRAVENOUS | Status: DC
  Administered 2012-12-09: 19:00:00 via INTRAVENOUS
  Administered 2012-12-10: 100 mL/h via INTRAVENOUS
  Administered 2012-12-11: 12:00:00 via INTRAVENOUS

## 2012-12-09 MED ORDER — IOHEXOL 300 MG/ML  SOLN
50.0000 mL | Freq: Once | INTRAMUSCULAR | Status: AC | PRN
  Administered 2012-12-09: 50 mL via ORAL

## 2012-12-09 MED ORDER — METOLAZONE 5 MG PO TABS: 5.0000 mg | ORAL_TABLET | ORAL | Status: DC

## 2012-12-09 MED ORDER — BISACODYL 10 MG RE SUPP: 10.0000 mg | Freq: Every day | RECTAL | Status: DC | PRN

## 2012-12-09 MED ORDER — MORPHINE SULFATE 2 MG/ML IJ SOLN
1.0000 mg | INTRAMUSCULAR | Status: DC | PRN
  Filled 2012-12-09 (×2): qty 1

## 2012-12-09 MED ORDER — ADULT MULTIVITAMIN W/MINERALS CH
1.0000 | ORAL_TABLET | Freq: Every day | ORAL | Status: DC
  Administered 2012-12-09 – 2012-12-10 (×2): 1 via ORAL
  Filled 2012-12-09 (×2): qty 1

## 2012-12-09 MED ORDER — DOXAZOSIN MESYLATE 2 MG PO TABS
2.0000 mg | ORAL_TABLET | Freq: Every day | ORAL | Status: DC
  Administered 2012-12-09: 2 mg via ORAL
  Filled 2012-12-09 (×2): qty 1

## 2012-12-09 MED ORDER — POLYETHYLENE GLYCOL 3350 17 G PO PACK
17.0000 g | PACK | Freq: Two times a day (BID) | ORAL | Status: DC
  Administered 2012-12-09 – 2012-12-11 (×4): 17 g via ORAL
  Filled 2012-12-09 (×8): qty 1

## 2012-12-09 MED ORDER — AMLODIPINE BESYLATE 10 MG PO TABS
10.0000 mg | ORAL_TABLET | Freq: Every day | ORAL | Status: DC
  Administered 2012-12-09 – 2012-12-10 (×2): 10 mg via ORAL
  Filled 2012-12-09 (×2): qty 1

## 2012-12-09 MED ORDER — ONDANSETRON HCL 4 MG/2ML IJ SOLN
4.0000 mg | Freq: Four times a day (QID) | INTRAMUSCULAR | Status: DC | PRN
  Administered 2012-12-09 – 2012-12-20 (×5): 4 mg via INTRAVENOUS
  Filled 2012-12-09 (×5): qty 2

## 2012-12-09 NOTE — ED Notes (Signed)
Per pt states she had a BM on Sunday but said it was not enough.  Had another BM on Monday X2 but states it still was not enough-states she has a build up of gas that can't go around the "blockage"

## 2012-12-09 NOTE — Progress Notes (Signed)
Patient declines MyChart activation

## 2012-12-09 NOTE — ED Notes (Signed)
Patient transported to CT 

## 2012-12-09 NOTE — ED Notes (Signed)
Patient transported to X-ray 

## 2012-12-09 NOTE — H&P (Signed)
[facility] History and Physical  [name] [id number] DOB: 1934-01-10 DOA: 12/09/2012  Referring physician: ER physician PCP: Milagros Evener, MD   Chief Complaint: abdominal pain, nausea  HPI:  79 year-old female with past medical history of diabetes, hypertension, CKD, dyslipidemia who presented to [facility] ED with complaints of worsening abdominal discomfort for past few days prior to this admission associated with nauseas but no vomiting. Pt also reported poor oral intake. Pt rates pain in abdomen 5/10 in intensity in mid abdominal region and non radiating. No aggravating symptoms. She tried miralax but had no BM. No fever or chills. No rectal bleed. No lightheadedness or loss of consciousness. In ED, vital signs were stable. Abd x ray showed SBO and CT abdomen is still pending. BMP and CBC were unremarkable. Lipase level was WNL.  Assessment and Plan:  Principal Problem:   SBO (small bowel obstruction) - continue conservative management with IV fluids, analgesia and antiemetics as needed - if no improvement will get surgery consult  - Abd x ray shows SBO; follow up CT abdomen - lipase level WNL - keep NPO except for meds Active Problems:   Constipation - will start colace, miralax and bisacodyl suppository   HTN (hypertension) - continue norvasc, lasix and metoprolol   Dyslipidemia - continue statin therapy   Atrial fibrillation - check 12 lead EKG - on amiodarone and metoprolol for rate control - also on coumadin   CKD, stage 3 - creatinine 1.7 in 2012 and on this admission 1.46 - continue to monitor renal function   Diabetes - check A1c - continue glipizide and Januvia   Radiological Exams on Admission: Dg Abd Acute W/chest 12/09/2012   IMPRESSION: Dilated small bowel loops with scattered air-fluid levels concerning for small bowel obstruction.   Electronically Signed   By: Rolm Baptise M.D.   On: 12/09/2012 12:22    EKG: not done on admission  Code  Status: Full Family Communication: Pt at bedside Disposition Plan: Admit for further evaluation  Leisa Lenz, MD  [facility] Pager [phone]  Review of Systems:  Constitutional: Negative for fever, chills and malaise/fatigue. Negative for diaphoresis.  HENT: Negative for hearing loss, ear pain, nosebleeds, congestion, sore throat, neck pain, tinnitus and ear discharge.   Eyes: Negative for blurred vision, double vision, photophobia, pain, discharge and redness.  Respiratory: Negative for cough, hemoptysis, sputum production, shortness of breath, wheezing and stridor.   Cardiovascular: Negative for chest pain, palpitations, orthopnea, claudication and leg swelling.  Gastrointestinal: per HPI Genitourinary: Negative for dysuria, urgency, frequency, hematuria and flank pain.  Musculoskeletal: Negative for myalgias, back pain, joint pain and falls.  Skin: Negative for itching and rash.  Neurological: Negative for dizziness and weakness. Negative for tingling, tremors, sensory change, speech change, focal weakness, loss of consciousness and headaches.  Endo/Heme/Allergies: Negative for environmental allergies and polydipsia. Does not bruise/bleed easily.  Psychiatric/Behavioral: Negative for suicidal ideas. The patient is not nervous/anxious.      Past Medical History  Diagnosis Date  . Hypertension   . Diabetes mellitus without complication   . Irregular heart beat   . Renal disorder    Past Surgical History  Procedure Laterality Date  . Hernia repair    . Right lumpectomy    . Cataract extraction, bilateral     Social History:  reports that she has quit smoking. Her smoking use included Cigarettes. She smoked 0.00 packs per day. She has never used smokeless tobacco. She reports that she does not drink alcohol  or use illicit drugs.  Allergies  Allergen Reactions  . Lotensin [Benazepril Hcl] Anaphylaxis  . Zantac [Ranitidine Hcl] Anaphylaxis    Family History: heart  disease in parents   Prior to Admission medications   Medication Sig Start Date End Date Taking? Authorizing Provider  acarbose (PRECOSE) 100 MG tablet Take 100 mg by mouth 3 (three) times daily with meals.   Yes Historical Provider, MD  amiodarone (PACERONE) 200 MG tablet Take 200 mg by mouth daily.   Yes Historical Provider, MD  amLODipine (NORVASC) 10 MG tablet Take 10 mg by mouth daily.   Yes Historical Provider, MD  atorvastatin (LIPITOR) 40 MG tablet Take 40 mg by mouth daily.   Yes Historical Provider, MD  Calcium Carbonate-Vit D-Min (CALCIUM 1200 PO) Take 1,200 mg by mouth daily.   Yes Historical Provider, MD  Cranberry 250 MG CAPS Take 250 mg by mouth daily.   Yes Historical Provider, MD  doxazosin (CARDURA) 4 MG tablet Take 2 mg by mouth at bedtime.   Yes Historical Provider, MD  econazole nitrate 1 % cream Apply 1 application topically daily as needed (to groin rash as needed).   Yes Historical Provider, MD  esomeprazole (NEXIUM) 20 MG capsule Take 40 mg by mouth daily before breakfast.   Yes Historical Provider, MD  ferrous sulfate 325 (65 FE) MG tablet Take 325 mg by mouth daily with breakfast.   Yes Historical Provider, MD  fluticasone (FLONASE) 50 MCG/ACT nasal spray Place 2 sprays into the nose daily as needed for allergies.   Yes Historical Provider, MD  furosemide (LASIX) 80 MG tablet Take 80 mg by mouth 3 (three) times daily.   Yes Historical Provider, MD  glipiZIDE (GLUCOTROL XL) 10 MG 24 hr tablet Take 10 mg by mouth daily.   Yes Historical Provider, MD  metolazone (ZAROXOLYN) 5 MG tablet Take 5 mg by mouth every Monday, Wednesday, and Friday.   Yes Historical Provider, MD  metoprolol (LOPRESSOR) 50 MG tablet Take 50 mg by mouth 2 (two) times daily.   Yes Historical Provider, MD  Multiple Vitamin (MULTIVITAMIN WITH MINERALS) TABS tablet Take 1 tablet by mouth daily.   Yes Historical Provider, MD  potassium chloride (K-DUR,KLOR-CON) 10 MEQ tablet Take 10 mEq by mouth 2 (two)  times daily.   Yes Historical Provider, MD  sitaGLIPtin (JANUVIA) 25 MG tablet Take 25 mg by mouth daily.   Yes Historical Provider, MD  warfarin (COUMADIN) 5 MG tablet Take 5 mg by mouth daily with supper.   Yes Historical Provider, MD   Physical Exam: Filed Vitals:   12/09/12 0953  BP: 146/67  Pulse: 79  Temp: 98.6 F (37 C)  TempSrc: Oral  Resp: 18  SpO2: 93%    Physical Exam  Constitutional: Appears well-developed and well-nourished. No distress.  HENT: Normocephalic. External right and left ear normal. Oropharynx is clear and moist.  Eyes: Conjunctivae and EOM are normal. PERRLA, no scleral icterus.  Neck: Normal ROM. Neck supple. No JVD. No tracheal deviation. No thyromegaly.  CVS: RRR, S1/S2 +, no murmurs, no gallops, no carotid bruit.  Pulmonary: Effort and breath sounds normal, no stridor, rhonchi, wheezes, rales.  Abdominal: Soft. BS +,  Ventral hernia appreciated, no rebound tenderness  Musculoskeletal: Normal range of motion. No edema and no tenderness.  Lymphadenopathy: No lymphadenopathy noted, cervical, inguinal. Neuro: Alert. Normal reflexes, muscle tone coordination. No cranial nerve deficit. Skin: Skin is warm and dry. No rash noted. Not diaphoretic. No erythema. No pallor.  Psychiatric:  Normal mood and affect. Behavior, judgment, thought content normal.   Labs on Admission:  Basic Metabolic Panel:  Recent Labs Lab 12/09/12 1038  NA 138  K 3.5  CL 99  CO2 28  GLUCOSE 203*  BUN 26*  CREATININE 1.46*  CALCIUM 9.5   Liver Function Tests:  Recent Labs Lab 12/09/12 1038  AST 25  ALT 28  ALKPHOS 45  BILITOT 0.9  PROT 7.0  ALBUMIN 3.2*    Recent Labs Lab 12/09/12 1038  LIPASE 26   No results found for this basename: AMMONIA,  in the last 168 hours CBC:  Recent Labs Lab 12/09/12 1038  WBC 9.7  NEUTROABS 7.3  HGB 15.4*  HCT 45.3  MCV 77.8*  PLT 193   Cardiac Enzymes: No results found for this basename: CKTOTAL, CKMB, CKMBINDEX,  TROPONINI,  in the last 168 hours BNP: No components found with this basename: POCBNP,  CBG: No results found for this basename: GLUCAP,  in the last 168 hours  If 7PM-7AM, please contact night-coverage [email or web address] Password [facility] 12/09/2012, 3:01 PM

## 2012-12-09 NOTE — Progress Notes (Signed)
ANTICOAGULATION CONSULT NOTE - Initial Consult  Pharmacy Consult for Warfarin Indication: atrial fibrillation  Allergies  Allergen Reactions  . Lotensin [Benazepril Hcl] Anaphylaxis  . Zantac [Ranitidine Hcl] Anaphylaxis    Patient Measurements: Height: 5\' 6"  (167.6 cm) Weight: 235 lb 9.6 oz (106.867 kg) IBW/kg (Calculated) : 59.3 Heparin Dosing Weight: 83.9kg  Vital Signs: Temp: 98.3 F (36.8 C) (09/18 1700) Temp src: Oral (09/18 1700) BP: 143/68 mmHg (09/18 1700) Pulse Rate: 70 (09/18 1700)  Labs:  Recent Labs  12/09/12 1038 12/09/12 1554  HGB 15.4*  --   HCT 45.3  --   PLT 193  --   LABPROT  --  20.9*  INR  --  1.86*  CREATININE 1.46*  --     Estimated Creatinine Clearance: 39.9 ml/min (by C-G formula based on Cr of 1.46).   Medical History: Past Medical History  Diagnosis Date  . Hypertension   . Diabetes mellitus without complication   . Irregular heart beat   . Renal disorder     Medications:  Scheduled:  . sodium chloride   Intravenous STAT  . amiodarone  200 mg Oral Daily  . amLODipine  10 mg Oral Daily  . atorvastatin  40 mg Oral q1800  . docusate sodium  100 mg Oral BID  . doxazosin  2 mg Oral QHS  . furosemide  80 mg Oral TID WC  . [START ON 12/10/2012] glipiZIDE  10 mg Oral Q breakfast  . [START ON 12/10/2012] linagliptin  5 mg Oral Daily  . [START ON 12/10/2012] metolazone  5 mg Oral Q M,W,F  . metoprolol  50 mg Oral BID  . multivitamin with minerals  1 tablet Oral Daily  . polyethylene glycol  17 g Oral BID  . potassium chloride  10 mEq Oral BID   Infusions:  . sodium chloride     PRN: acetaminophen, acetaminophen, bisacodyl, econazole nitrate, fluticasone, morphine injection, ondansetron (ZOFRAN) IV, ondansetron (ZOFRAN) IV, ondansetron  Assessment: 78 yo F with atrial fibrillation. On warfarin prior to admission. Currently admitted with small bowel obstruction. Goal of Therapy:  INR 2-3 Monitor platelets by anticoagulation  protocol: Yes   Plan:   Since patient takes warfarin 5 mg daily at home, will give warfarin 6mg  x 1 tonight because their INR is subtherapeutic (1.86).  PT-INR daily while on warfarin  Warfarin teaching booklet and video ordered.    Gypsy Decant 12/09/2012,6:57 PM

## 2012-12-09 NOTE — ED Provider Notes (Signed)
CSN: [id number]     Arrival date & time 12/09/12  0945 History   First MD Initiated Contact with Patient 12/09/12 1003     Chief Complaint  Patient presents with  . Constipation   (Consider location/radiation/quality/duration/timing/severity/associated sxs/prior Treatment) HPI Patient 79 year old female who is a bowel movement since Monday. She presents with abdominal cramping is episodic. She is passed minimal gas since that point. She has mild nausea but no vomiting. Patient says she has taken 2 doses of MiraLax with no relief. Currently she has no pain. Denies fevers or chills. Denies urinary symptoms. Past Medical History  Diagnosis Date  . Hypertension   . Diabetes mellitus without complication   . Irregular heart beat   . Renal disorder    Past Surgical History  Procedure Laterality Date  . Hernia repair    . Right lumpectomy    . Cataract extraction, bilateral     History reviewed. No pertinent family history. History  Substance Use Topics  . Smoking status: Former Smoker    Types: Cigarettes  . Smokeless tobacco: Never Used  . Alcohol Use: No   OB History   Grav Para Term Preterm Abortions TAB SAB Ect Mult Living                 Review of Systems  Constitutional: Negative for fever and chills.  Respiratory: Negative for cough and shortness of breath.   Cardiovascular: Negative for chest pain.  Gastrointestinal: Positive for nausea, abdominal pain and constipation. Negative for vomiting, diarrhea and abdominal distention.  Genitourinary: Negative for dysuria and frequency.  Musculoskeletal: Negative for myalgias and back pain.  Skin: Negative for rash and wound.  Neurological: Negative for dizziness, weakness, light-headedness, numbness and headaches.  All other systems reviewed and are negative.    Allergies  Lotensin and Zantac  Home Medications   Current Outpatient Rx  Name  Route  Sig  Dispense  Refill  . acarbose (PRECOSE) 100 MG tablet   Oral  Take 100 mg by mouth 3 (three) times daily with meals.         Marland Kitchen amiodarone (PACERONE) 200 MG tablet   Oral   Take 200 mg by mouth daily.         Marland Kitchen amLODipine (NORVASC) 10 MG tablet   Oral   Take 10 mg by mouth daily.         Marland Kitchen atorvastatin (LIPITOR) 40 MG tablet   Oral   Take 40 mg by mouth daily.         . Calcium Carbonate-Vit D-Min (CALCIUM 1200 PO)   Oral   Take 1,200 mg by mouth daily.         . Cranberry 250 MG CAPS   Oral   Take 250 mg by mouth daily.         Marland Kitchen doxazosin (CARDURA) 4 MG tablet   Oral   Take 2 mg by mouth at bedtime.         Marland Kitchen econazole nitrate 1 % cream   Topical   Apply 1 application topically daily as needed (to groin rash as needed).         Marland Kitchen esomeprazole (NEXIUM) 20 MG capsule   Oral   Take 40 mg by mouth daily before breakfast.         . ferrous sulfate 325 (65 FE) MG tablet   Oral   Take 325 mg by mouth daily with breakfast.         .  fluticasone (FLONASE) 50 MCG/ACT nasal spray   Nasal   Place 2 sprays into the nose daily as needed for allergies.         . furosemide (LASIX) 80 MG tablet   Oral   Take 80 mg by mouth 3 (three) times daily.         Marland Kitchen glipiZIDE (GLUCOTROL XL) 10 MG 24 hr tablet   Oral   Take 10 mg by mouth daily.         . metolazone (ZAROXOLYN) 5 MG tablet   Oral   Take 5 mg by mouth every Monday, Wednesday, and Friday.         . metoprolol (LOPRESSOR) 50 MG tablet   Oral   Take 50 mg by mouth 2 (two) times daily.         . Multiple Vitamin (MULTIVITAMIN WITH MINERALS) TABS tablet   Oral   Take 1 tablet by mouth daily.         . potassium chloride (K-DUR,KLOR-CON) 10 MEQ tablet   Oral   Take 10 mEq by mouth 2 (two) times daily.         . sitaGLIPtin (JANUVIA) 25 MG tablet   Oral   Take 25 mg by mouth daily.         Marland Kitchen warfarin (COUMADIN) 5 MG tablet   Oral   Take 5 mg by mouth daily with supper.          BP 146/67  Pulse 79  Temp(Src) 98.6 F (37 C) (Oral)   Resp 18  SpO2 93% Physical Exam  Nursing note and vitals reviewed. Constitutional: She is oriented to person, place, and time. She appears well-developed and well-nourished. No distress.  HENT:  Head: Normocephalic and atraumatic.  Mouth/Throat: Oropharynx is clear and moist.  Eyes: EOM are normal. Pupils are equal, round, and reactive to light.  Neck: Normal range of motion. Neck supple.  Cardiovascular: Normal rate and regular rhythm.   Pulmonary/Chest: Effort normal and breath sounds normal. No respiratory distress. She has no wheezes. She has no rales.  Abdominal: Soft. She exhibits mass. She exhibits no distension. There is no tenderness. There is no rebound and no guarding.  Patient has a large ventral hernia that is easily reduced. She has hyperactive bowel sounds.  Musculoskeletal: Normal range of motion. She exhibits no edema and no tenderness.  Neurological: She is alert and oriented to person, place, and time.  Skin: Skin is warm and dry. No rash noted. No erythema.  Psychiatric: She has a normal mood and affect. Her behavior is normal.    ED Course  Procedures (including critical care time) Labs Review Labs Reviewed  CBC WITH DIFFERENTIAL - Abnormal; Notable for the following:    RBC 5.82 (*)    Hemoglobin 15.4 (*)    MCV 77.8 (*)    Monocytes Absolute 1.1 (*)    All other components within normal limits  COMPREHENSIVE METABOLIC PANEL - Abnormal; Notable for the following:    Glucose, Bld 203 (*)    BUN 26 (*)    Creatinine, Ser 1.46 (*)    Albumin 3.2 (*)    GFR calc non Af Amer 33 (*)    GFR calc Af Amer 39 (*)    All other components within normal limits  URINALYSIS, ROUTINE W REFLEX MICROSCOPIC - Abnormal; Notable for the following:    APPearance CLOUDY (*)    Protein, ur 30 (*)    All other components within normal  limits  LIPASE, BLOOD  URINE MICROSCOPIC-ADD ON   Imaging Review Dg Abd Acute W/chest  12/09/2012   CLINICAL DATA:  Abdominal pain,  hypertension.  EXAM: ACUTE ABDOMEN SERIES (ABDOMEN 2 VIEW & CHEST 1 VIEW)  COMPARISON:  07/27/2012  FINDINGS: Dilated small bowel loops with scattered air-fluid levels in the abdomen. Findings concerning for small bowel obstruction. Calcifications in the center of the pelvis, likely calcified fibroids. No organomegaly. No free air.  IMPRESSION: Dilated small bowel loops with scattered air-fluid levels concerning for small bowel obstruction.   Electronically Signed   By: Rolm Baptise M.D.   On: 12/09/2012 12:22    MDM  Aspect patient has constipation and has been not treated completely with MiraLax. Given history of abdominal surgery though will get abdominal x-rays to look for evidence of obstruction. We'll screen with abdominal labs and urinalysis. Patient is very well-appearing at this time.  Discussed with Dr. Charlies Silvers. She will admit to the medical floor. CT results are still pending.  Julianne Rice, MD 12/09/12 1455

## 2012-12-09 NOTE — Progress Notes (Signed)
Utilization Review completed.  Quante Pettry RN CM  

## 2012-12-10 ENCOUNTER — Inpatient Hospital Stay (HOSPITAL_COMMUNITY): Payer: Medicare Other

## 2012-12-10 DIAGNOSIS — K56609 Unspecified intestinal obstruction, unspecified as to partial versus complete obstruction: Secondary | ICD-10-CM

## 2012-12-10 DIAGNOSIS — N183 Chronic kidney disease, stage 3 unspecified: Secondary | ICD-10-CM

## 2012-12-10 DIAGNOSIS — I4891 Unspecified atrial fibrillation: Secondary | ICD-10-CM

## 2012-12-10 DIAGNOSIS — K420 Umbilical hernia with obstruction, without gangrene: Secondary | ICD-10-CM | POA: Diagnosis present

## 2012-12-10 DIAGNOSIS — E119 Type 2 diabetes mellitus without complications: Secondary | ICD-10-CM

## 2012-12-10 DIAGNOSIS — K439 Ventral hernia without obstruction or gangrene: Secondary | ICD-10-CM

## 2012-12-10 HISTORY — DX: Umbilical hernia with obstruction, without gangrene: K42.0

## 2012-12-10 LAB — CBC
MCH: 26.1 pg (ref 26.0–34.0)
MCHC: 33.2 g/dL (ref 30.0–36.0)
RDW: 13.4 % (ref 11.5–15.5)

## 2012-12-10 LAB — COMPREHENSIVE METABOLIC PANEL
ALT: 23 U/L (ref 0–35)
AST: 28 U/L (ref 0–37)
Alkaline Phosphatase: 37 U/L — ABNORMAL LOW (ref 39–117)
CO2: 27 mEq/L (ref 19–32)
Chloride: 102 mEq/L (ref 96–112)
GFR calc Af Amer: 37 mL/min — ABNORMAL LOW (ref >90)
GFR calc non Af Amer: 32 mL/min — ABNORMAL LOW (ref >90)
Glucose, Bld: 141 mg/dL — ABNORMAL HIGH (ref 70–99)
Potassium: 3.7 mEq/L (ref 3.5–5.1)
Sodium: 138 mEq/L (ref 135–145)

## 2012-12-10 LAB — GLUCOSE, CAPILLARY: Glucose-Capillary: 117 mg/dL — ABNORMAL HIGH (ref 70–99)

## 2012-12-10 LAB — PROTIME-INR
INR: 1.9 — ABNORMAL HIGH (ref 0.00–1.49)
Prothrombin Time: 21.2 seconds — ABNORMAL HIGH (ref 11.6–15.2)

## 2012-12-10 MED ORDER — INSULIN ASPART 100 UNIT/ML ~~LOC~~ SOLN
0.0000 [IU] | Freq: Three times a day (TID) | SUBCUTANEOUS | Status: DC
  Administered 2012-12-12 – 2012-12-13 (×3): 1 [IU] via SUBCUTANEOUS
  Administered 2012-12-13 – 2012-12-14 (×2): 2 [IU] via SUBCUTANEOUS
  Administered 2012-12-14 (×2): 1 [IU] via SUBCUTANEOUS
  Administered 2012-12-15: 2 [IU] via SUBCUTANEOUS

## 2012-12-10 MED ORDER — WARFARIN SODIUM 6 MG PO TABS
6.0000 mg | ORAL_TABLET | Freq: Once | ORAL | Status: DC
  Filled 2012-12-10: qty 1

## 2012-12-10 MED ORDER — VITAMIN K1 10 MG/ML IJ SOLN
10.0000 mg | Freq: Once | INTRAMUSCULAR | Status: AC
  Administered 2012-12-10: 10 mg via SUBCUTANEOUS
  Filled 2012-12-10: qty 1

## 2012-12-10 MED ORDER — FUROSEMIDE 10 MG/ML IJ SOLN
40.0000 mg | Freq: Two times a day (BID) | INTRAMUSCULAR | Status: DC
  Administered 2012-12-10 – 2012-12-14 (×8): 40 mg via INTRAVENOUS
  Filled 2012-12-10 (×10): qty 4

## 2012-12-10 MED ORDER — INSULIN ASPART 100 UNIT/ML ~~LOC~~ SOLN: 0.0000 [IU] | Freq: Every day | SUBCUTANEOUS | Status: DC

## 2012-12-10 MED ORDER — HEPARIN (PORCINE) IN NACL 100-0.45 UNIT/ML-% IJ SOLN
1200.0000 [IU]/h | INTRAMUSCULAR | Status: DC
  Administered 2012-12-10: 1200 [IU]/h via INTRAVENOUS
  Filled 2012-12-10 (×2): qty 250

## 2012-12-10 MED ORDER — METOPROLOL TARTRATE 1 MG/ML IV SOLN
2.5000 mg | Freq: Two times a day (BID) | INTRAVENOUS | Status: DC
  Administered 2012-12-10 – 2012-12-14 (×9): 2.5 mg via INTRAVENOUS
  Filled 2012-12-10 (×11): qty 5

## 2012-12-10 NOTE — Progress Notes (Signed)
ANTICOAGULATION CONSULT NOTE - Initial Consult  Pharmacy Consult for IV Heparin Indication: atrial fibrillation  Allergies  Allergen Reactions  . Lotensin [Benazepril Hcl] Anaphylaxis  . Zantac [Ranitidine Hcl] Anaphylaxis    Patient Measurements: Height: 5\' 6"  (167.6 cm) Weight: 235 lb 9.6 oz (106.867 kg) IBW/kg (Calculated) : 59.3 Heparin Dosing Weight: 84 kg  Vital Signs: Temp: 98.9 F (37.2 C) (09/19 1357) Temp src: Oral (09/19 1357) BP: 138/66 mmHg (09/19 1357) Pulse Rate: 59 (09/19 1357)  Labs:  Recent Labs  12/09/12 1038 12/09/12 1554 12/10/12 0431  HGB 15.4*  --  13.4  HCT 45.3  --  40.4  PLT 193  --  180  LABPROT  --  20.9* 21.2*  INR  --  1.86* 1.90*  CREATININE 1.46*  --  1.51*    Estimated Creatinine Clearance: 38.6 ml/min (by C-G formula based on Cr of 1.51).   Medical History: Past Medical History  Diagnosis Date  . Hypertension   . Diabetes mellitus without complication   . Irregular heart beat   . Renal disorder      Assessment: 78 yoF on chronic warfarin therapy PTA for atrial fibrillation presents with ventral hernia/SBO/incarcerated SB.  Plan is to hold warfarin and start bridge with heparin for possible surgery.    Today's INR 1.9, surgery PA ordered Vitamin K 10 mg SQ x 1 now to reverse  SCr 1.51, CrCl~38 ml/min  CBC WNL  Goal of Therapy:  Heparin level 0.3-0.7 units/ml Monitor platelets by anticoagulation protocol: Yes   Plan:  1.  Start heparin infusion (without bolus due to near therapeutic INR) at 1200 units/hr (12 mL/hr). 2.  Check heparin level in 8 hours. 3.  Daily HL and CBC while on heparin. 4.  F/u timing of surgery and plan to hold heparin prior to surgery.  Hershal Coria 12/10/2012,2:51 PM

## 2012-12-10 NOTE — Consult Note (Signed)
Bowel reducible, but pops back out pretty quickly.  Pt had BM and passed gas right after I reduced bowel.    Will give vitamin K and place on heparin gtt.  Will need hernia repair this admission.

## 2012-12-10 NOTE — Progress Notes (Signed)
Notified Dr, Karleen Hampshire regarding placement of NGT order, she states she will review medications for IV administrations

## 2012-12-10 NOTE — Progress Notes (Signed)
ANTICOAGULATION CONSULT NOTE - Follow Up Consult  Pharmacy Consult for Warfarin  Indication: atrial fibrillation  Allergies  Allergen Reactions  . Lotensin [Benazepril Hcl] Anaphylaxis  . Zantac [Ranitidine Hcl] Anaphylaxis    Patient Measurements: Height: 5\' 6"  (167.6 cm) Weight: 235 lb 9.6 oz (106.867 kg) IBW/kg (Calculated) : 59.3   Vital Signs: Temp: 98.9 F (37.2 C) (09/19 0546) Temp src: Oral (09/19 0546) BP: 146/76 mmHg (09/19 0546) Pulse Rate: 62 (09/19 0546)  Labs:  Recent Labs  12/09/12 1038 12/09/12 1554 12/10/12 0431  HGB 15.4*  --  13.4  HCT 45.3  --  40.4  PLT 193  --  180  LABPROT  --  20.9* 21.2*  INR  --  1.86* 1.90*  CREATININE 1.46*  --  1.51*    Estimated Creatinine Clearance: 38.6 ml/min (by C-G formula based on Cr of 1.51).   Medications:  Scheduled:  . sodium chloride   Intravenous STAT  . alum & mag hydroxide-simeth  15 mL Oral Once  . amiodarone  200 mg Oral Daily  . amLODipine  10 mg Oral Daily  . atorvastatin  40 mg Oral q1800  . docusate sodium  100 mg Oral BID  . doxazosin  2 mg Oral QHS  . furosemide  80 mg Oral TID WC  . glipiZIDE  10 mg Oral Q breakfast  . linagliptin  5 mg Oral Daily  . metolazone  5 mg Oral Custom  . metoprolol  50 mg Oral BID  . multivitamin with minerals  1 tablet Oral Daily  . polyethylene glycol  17 g Oral BID  . potassium chloride  10 mEq Oral BID  . Warfarin - Pharmacist Dosing Inpatient   Does not apply q1800   Infusions:  . sodium chloride 100 mL/hr at 12/09/12 1912   PRN: acetaminophen, acetaminophen, bisacodyl, econazole nitrate, fluticasone, morphine injection, ondansetron (ZOFRAN) IV, ondansetron  Assessment:  [name] with Hx of A.Fib on chronic anticoagulation with warfarin.  Admitted with SBO.  Patient is NPO except for medications   PTA warfarin dose = 5 mg PO daily; Last dose PTA was 9/17  INR on admission was 1.89, below goal of 2-3.  INR today is 1.9   Drug interaction with  amiodarone but was continued from home regimen  CBC okay, no issues with bleeding   Goal of Therapy:  INR 2-3 Monitor platelets by anticoagulation protocol: Yes   Plan:  1.) Repeat 6 mg po x 1 tonight at 1800 2.) Monitor PT/INR daily  [name], [name] PharmD Pager #: [phone] 1:28 PM 12/10/2012

## 2012-12-10 NOTE — Evaluation (Signed)
Physical Therapy Evaluation Patient Details Name: [name] MRN: [id number] DOB: 1934-03-10 Today's Date: 12/10/2012 Time: [id number] PT Time Calculation (min): 23 min  PT Assessment / Plan / Recommendation History of Present Illness    79 year-old female with past medical history of diabetes, hypertension, CKD, dyslipidemia who presented to [facility] ED with complaints of worsening abdominal discomfort for past few days prior to this admission associated with nauseas but no vomiting. Pt also reported poor oral intake. Pt rates pain in abdomen 5/10 in intensity in mid abdominal region and non radiating. No aggravating symptoms. She tried miralax but had no BM. No fever or chills. No rectal bleed. No lightheadedness or loss of consciousness.   Clinical Impression  Pt presents with functional mobility limitations 2* ambulatory balance deficits and generalized weakness 2* ongoing bowel issues.  Pt states she is approaching baseline level of function and plans d/c home with assist of spouse and dtr.    PT Assessment  Patient needs continued PT services    Follow Up Recommendations  No PT follow up    Does the patient have the potential to tolerate intense rehabilitation      Barriers to Discharge        Equipment Recommendations  None recommended by PT    Recommendations for Other Services OT consult   Frequency Min 3X/week    Precautions / Restrictions Precautions Precautions: Fall Restrictions Weight Bearing Restrictions: No   Pertinent Vitals/Pain No specific c/o pain at this time      Mobility  Bed Mobility Bed Mobility: Supine to Sit Supine to Sit: 5: Supervision;With rails Transfers Transfers: Sit to Stand;Stand to Sit Sit to Stand: 4: Min guard;From bed Stand to Sit: 4: Min guard;To chair/3-in-1 Details for Transfer Assistance: cues for use of UEs to self assist Ambulation/Gait Ambulation/Gait Assistance: 4: Min assist;4: Min guard Ambulation Distance (Feet): 300  Feet Assistive device: Small based quad cane Ambulation/Gait Assistance Details: cues for posture and for placement of all cane feet in contact with floor Gait Pattern: Step-to pattern;Step-through pattern;Wide base of support;Decreased stride length;Shuffle;Antalgic Stairs: No    Exercises     PT Diagnosis: Difficulty walking  PT Problem List: Decreased strength;Decreased balance;Decreased activity tolerance;Decreased mobility;Decreased safety awareness;Decreased knowledge of use of DME;Obesity PT Treatment Interventions: DME instruction;Gait training;Stair training;Functional mobility training;Therapeutic activities;Therapeutic exercise;Balance training;Patient/family education     PT Goals(Current goals can be found in the care plan section) Acute Rehab PT Goals Patient Stated Goal: Resume previous lifestyle asap PT Goal Formulation: With patient Time For Goal Achievement: 12/16/12 Potential to Achieve Goals: Good  Visit Information  Last PT Received On: 12/10/12 Assistance Needed: +1       Prior [place] expects to be discharged to:: Private residence Living Arrangements: Spouse/significant other Available Help at Discharge: Family Type of Home: House Home Access: Stairs to enter Technical brewer of Steps: 2 Entrance Stairs-Rails: None Home Layout: One level Home Equipment: Environmental consultant - 2 wheels;Cane - quad Prior Function Level of Independence: Independent with assistive device(s) Communication Communication: No difficulties Dominant Hand: Right    Cognition  Cognition Arousal/Alertness: Awake/alert Behavior During Therapy: WFL for tasks assessed/performed Overall Cognitive Status: Within Functional Limits for tasks assessed    Extremity/Trunk Assessment Upper Extremity Assessment Upper Extremity Assessment: Overall WFL for tasks assessed Lower Extremity Assessment Lower Extremity Assessment: Overall WFL for tasks assessed    Balance    End of Session PT - End of Session Equipment Utilized During Treatment: Gait belt Activity  Tolerance: Patient tolerated treatment well;Patient limited by fatigue Patient left: in chair;with call bell/phone within reach;with family/visitor present Nurse Communication: Mobility status  GP     Aidan Moten 12/10/2012, 12:56 PM

## 2012-12-10 NOTE — Consult Note (Signed)
Reason for Consult: Umbilical Hernia with obstruction Referring Physician: Dr. Debbora Lacrosse is an 79 y.o. female.  HPI: 79 year old female who is a bowel movement since Monday. She presents with abdominal cramping is episodic. She is passed minimal gas since that point. She has mild nausea but no vomiting. Patient says she has taken 2 doses of MiraLax with no relief. Currently she has no pain. Denies fevers or chills. Denies urinary symptoms. No flatus.  She has a CT scan without contrast that shows a mid abdominal periumbilical hernia , above the old repair with loop of bowel in the hernia and SBO.  Repeat film shows ongoing obstruction with distended Small bowel up to 5 cm.  She reports some gas, she is not having pain and is very comfortable.  Despite the fact I can feel SB within the hernia.   Past Medical History  Diagnosis Date  . Hypertension   . Diabetes mellitus without complication   . Atrial Fibrillation with chronic coumadin use.    Hx of tobacco use  (40 years)    Right breast cancer with lumpectomy and radiation Rx  2002   Prior Ventral/Umbilical hernia repair [id number]   Body mass index is 38    . Renal disorder Stage III     Past Surgical History  Procedure Laterality Date  . Hernia repair 2002   . Right lumpectomy    . Cataract extraction, bilateral      History reviewed. No pertinent family history.  Social History:  reports that she has quit smoking. Her smoking use included Cigarettes. She smoked 0.00 packs per day. She has never used smokeless tobacco. She reports that she does not drink alcohol or use illicit drugs. Tobacco 40 years 1PPD Etoh: social now. Drugs: none Retired Pharmacist, hospital Allergies:  Allergies  Allergen Reactions  . Lotensin [Benazepril Hcl] Anaphylaxis  . Zantac [Ranitidine Hcl] Anaphylaxis    Medications:  Prior to Admission:  Prescriptions prior to admission  Medication Sig Dispense Refill  . acarbose (PRECOSE) 100 MG tablet Take  100 mg by mouth 3 (three) times daily with meals.      Marland Kitchen amiodarone (PACERONE) 200 MG tablet Take 200 mg by mouth daily.      Marland Kitchen amLODipine (NORVASC) 10 MG tablet Take 10 mg by mouth daily.      Marland Kitchen atorvastatin (LIPITOR) 40 MG tablet Take 40 mg by mouth daily.      . Calcium Carbonate-Vit D-Min (CALCIUM 1200 PO) Take 1,200 mg by mouth daily.      . Cranberry 250 MG CAPS Take 250 mg by mouth daily.      Marland Kitchen doxazosin (CARDURA) 4 MG tablet Take 2 mg by mouth at bedtime.      Marland Kitchen econazole nitrate 1 % cream Apply 1 application topically daily as needed (to groin rash as needed).      Marland Kitchen esomeprazole (NEXIUM) 20 MG capsule Take 40 mg by mouth daily before breakfast.      . ferrous sulfate 325 (65 FE) MG tablet Take 325 mg by mouth daily with breakfast.      . fluticasone (FLONASE) 50 MCG/ACT nasal spray Place 2 sprays into the nose daily as needed for allergies.      . furosemide (LASIX) 80 MG tablet Take 80 mg by mouth 3 (three) times daily.      Marland Kitchen glipiZIDE (GLUCOTROL XL) 10 MG 24 hr tablet Take 10 mg by mouth daily.      . metolazone (ZAROXOLYN)  5 MG tablet Take 5 mg by mouth every Monday, Wednesday, and Friday.      . metoprolol (LOPRESSOR) 50 MG tablet Take 50 mg by mouth 2 (two) times daily.      . Multiple Vitamin (MULTIVITAMIN WITH MINERALS) TABS tablet Take 1 tablet by mouth daily.      . potassium chloride (K-DUR,KLOR-CON) 10 MEQ tablet Take 10 mEq by mouth 2 (two) times daily.      . sitaGLIPtin (JANUVIA) 25 MG tablet Take 25 mg by mouth daily.      Marland Kitchen warfarin (COUMADIN) 5 MG tablet Take 5 mg by mouth daily with supper.       Scheduled: . alum & mag hydroxide-simeth  15 mL Oral Once  . amiodarone  200 mg Oral Daily  . amLODipine  10 mg Oral Daily  . atorvastatin  40 mg Oral q1800  . docusate sodium  100 mg Oral BID  . doxazosin  2 mg Oral QHS  . furosemide  80 mg Oral TID WC  . glipiZIDE  10 mg Oral Q breakfast  . linagliptin  5 mg Oral Daily  . metolazone  5 mg Oral Custom  .  metoprolol  50 mg Oral BID  . multivitamin with minerals  1 tablet Oral Daily  . polyethylene glycol  17 g Oral BID  . potassium chloride  10 mEq Oral BID  . Warfarin - Pharmacist Dosing Inpatient   Does not apply q1800   Continuous: . sodium chloride 100 mL/hr at 12/09/12 1912   [id number], acetaminophen, bisacodyl, econazole nitrate, fluticasone, morphine injection, ondansetron (ZOFRAN) IV, ondansetron Anti-infectives   None      Results for orders placed during the hospital encounter of 12/09/12 (from the past 48 hour(s))  CBC WITH DIFFERENTIAL     Status: Abnormal   Collection Time    12/09/12 10:38 AM      Result Value Range   WBC 9.7  4.0 - 10.5 K/uL   RBC 5.82 (*) 3.87 - 5.11 MIL/uL   Hemoglobin 15.4 (*) 12.0 - 15.0 g/dL   HCT 45.3  36.0 - 46.0 %   MCV 77.8 (*) 78.0 - 100.0 fL   MCH 26.5  26.0 - 34.0 pg   MCHC 34.0  30.0 - 36.0 g/dL   RDW 13.2  11.5 - 15.5 %   Platelets 193  150 - 400 K/uL   Neutrophils Relative % 75  43 - 77 %   Neutro Abs 7.3  1.7 - 7.7 K/uL   Lymphocytes Relative 13  12 - 46 %   Lymphs Abs 1.3  0.7 - 4.0 K/uL   Monocytes Relative 11  3 - 12 %   Monocytes Absolute 1.1 (*) 0.1 - 1.0 K/uL   Eosinophils Relative 1  0 - 5 %   Eosinophils Absolute 0.1  0.0 - 0.7 K/uL   Basophils Relative 0  0 - 1 %   Basophils Absolute 0.0  0.0 - 0.1 K/uL  COMPREHENSIVE METABOLIC PANEL     Status: Abnormal   Collection Time    12/09/12 10:38 AM      Result Value Range   Sodium 138  135 - 145 mEq/L   Potassium 3.5  3.5 - 5.1 mEq/L   Chloride 99  96 - 112 mEq/L   CO2 28  19 - 32 mEq/L   Glucose, Bld 203 (*) 70 - 99 mg/dL   BUN 26 (*) 6 - 23 mg/dL   Creatinine, Ser 1.46 (*) 0.50 -  1.10 mg/dL   Calcium 9.5  8.4 - 10.5 mg/dL   Total Protein 7.0  6.0 - 8.3 g/dL   Albumin 3.2 (*) 3.5 - 5.2 g/dL   AST 25  0 - 37 U/L   ALT 28  0 - 35 U/L   Alkaline Phosphatase 45  39 - 117 U/L   Total Bilirubin 0.9  0.3 - 1.2 mg/dL   GFR calc non Af Amer 33 (*) >90 mL/min    GFR calc Af Amer 39 (*) >90 mL/min   Comment: (NOTE)     The eGFR has been calculated using the CKD EPI equation.     This calculation has not been validated in all clinical situations.     eGFR's persistently <90 mL/min signify possible Chronic Kidney     Disease.  LIPASE, BLOOD     Status: None   Collection Time    12/09/12 10:38 AM      Result Value Range   Lipase 26  11 - 59 U/L  URINALYSIS, ROUTINE W REFLEX MICROSCOPIC     Status: Abnormal   Collection Time    12/09/12 11:35 AM      Result Value Range   Color, Urine YELLOW  YELLOW   APPearance CLOUDY (*) CLEAR   Specific Gravity, Urine 1.019  1.005 - 1.030   pH 5.5  5.0 - 8.0   Glucose, UA NEGATIVE  NEGATIVE mg/dL   Hgb urine dipstick NEGATIVE  NEGATIVE   Bilirubin Urine NEGATIVE  NEGATIVE   Ketones, ur NEGATIVE  NEGATIVE mg/dL   Protein, ur 30 (*) NEGATIVE mg/dL   Urobilinogen, UA 0.2  0.0 - 1.0 mg/dL   Nitrite NEGATIVE  NEGATIVE   Leukocytes, UA NEGATIVE  NEGATIVE  URINE MICROSCOPIC-ADD ON     Status: None   Collection Time    12/09/12 11:35 AM      Result Value Range   Squamous Epithelial / LPF RARE  RARE   WBC, UA 0-2  <3 WBC/hpf   RBC / HPF 0-2  <3 RBC/hpf   Bacteria, UA RARE  RARE   Urine-Other MUCOUS PRESENT    HEMOGLOBIN A1C     Status: Abnormal   Collection Time    12/09/12  3:30 PM      Result Value Range   Hemoglobin A1C 8.1 (*) <5.7 %   Comment: (NOTE)                                                                               According to the ADA Clinical Practice Recommendations for 2011, when     HbA1c is used as a screening test:      >=6.5%   Diagnostic of Diabetes Mellitus               (if abnormal result is confirmed)     5.7-6.4%   Increased risk of developing Diabetes Mellitus     References:Diagnosis and Classification of Diabetes Mellitus,Diabetes     [id number] 1):S62-S69 and Standards of Medical Care in             Diabetes - 2011,Diabetes Care,2011,34 (Suppl 1):S11-S61.    Mean Plasma Glucose 186 (*) <117 mg/dL  Comment: Performed at [place]     Status: Abnormal   Collection Time    12/09/12  3:54 PM      Result Value Range   Prothrombin Time 20.9 (*) 11.6 - 15.2 seconds   INR 1.86 (*) 0.00 - 1.49  COMPREHENSIVE METABOLIC PANEL     Status: Abnormal   Collection Time    12/10/12  4:31 AM      Result Value Range   Sodium 138  135 - 145 mEq/L   Potassium 3.7  3.5 - 5.1 mEq/L   Chloride 102  96 - 112 mEq/L   CO2 27  19 - 32 mEq/L   Glucose, Bld 141 (*) 70 - 99 mg/dL   BUN 30 (*) 6 - 23 mg/dL   Creatinine, Ser 1.51 (*) 0.50 - 1.10 mg/dL   Calcium 8.6  8.4 - 10.5 mg/dL   Total Protein 5.9 (*) 6.0 - 8.3 g/dL   Albumin 2.7 (*) 3.5 - 5.2 g/dL   AST 28  0 - 37 U/L   ALT 23  0 - 35 U/L   Alkaline Phosphatase 37 (*) 39 - 117 U/L   Total Bilirubin 0.9  0.3 - 1.2 mg/dL   GFR calc non Af Amer 32 (*) >90 mL/min   GFR calc Af Amer 37 (*) >90 mL/min   Comment: (NOTE)     The eGFR has been calculated using the CKD EPI equation.     This calculation has not been validated in all clinical situations.     eGFR's persistently <90 mL/min signify possible Chronic Kidney     Disease.  CBC     Status: Abnormal   Collection Time    12/10/12  4:31 AM      Result Value Range   WBC 9.1  4.0 - 10.5 K/uL   RBC 5.13 (*) 3.87 - 5.11 MIL/uL   Hemoglobin 13.4  12.0 - 15.0 g/dL   HCT 40.4  36.0 - 46.0 %   MCV 78.8  78.0 - 100.0 fL   MCH 26.1  26.0 - 34.0 pg   MCHC 33.2  30.0 - 36.0 g/dL   RDW 13.4  11.5 - 15.5 %   Platelets 180  150 - 400 K/uL  PROTIME-INR     Status: Abnormal   Collection Time    12/10/12  4:31 AM      Result Value Range   Prothrombin Time 21.2 (*) 11.6 - 15.2 seconds   INR 1.90 (*) 0.00 - 1.49  GLUCOSE, CAPILLARY     Status: Abnormal   Collection Time    12/10/12  7:44 AM      Result Value Range   Glucose-Capillary 118 (*) 70 - 99 mg/dL   Comment 1 Notify RN      Ct Abdomen Pelvis Wo Contrast  12/09/2012   *RADIOLOGY  REPORT*  Clinical Data: Lower pelvic pain  CT ABDOMEN AND PELVIS WITHOUT CONTRAST  Technique:  Multidetector CT imaging of the abdomen and pelvis was performed following the standard protocol without intravenous contrast.  Comparison: None.  Findings: Visualized lung bases appear normal.  Hepatic cyst is noted inferiorly in right hepatic lobe.  The spleen and pancreas appear normal.  No gallstones are noted.  Adrenal glands and kidneys appear normal.  No renal or ureteral calculi are noted. Urinary bladder appears normal.  Calcified fibroids are seen involving the uterus.  Phleboliths are noted in the pelvis. Sigmoid diverticulosis is noted without inflammation.  There is noted a ventral hernia in the right lower quadrant of the abdomen which contains a loop of incarcerated terminal ileum and results in partial obstruction of the more proximal small bowel.  Wall thickening and inflammatory changes are seen involving the more distal terminal ileum extending to the ileocecal valve.  No abnormal fluid collection is noted.  Contrast is noted within the colon as well as stool.  IMPRESSION: Ventral hernia is noted in the right lower quadrant of the anterior abdominal wall which contains a loop of incarcerated terminal ileum, resulting in partial small bowel obstruction and dilatation of the more proximal small bowel.   Original Report Authenticated By: Marijo Conception.,  M.D.   Dg Abd 2 Views  12/10/2012   CLINICAL DATA:  Evaluate for small bowel obstruction. Periumbilical hernia with prior surgery. Now with protruding hernia right abdominal region and occasional constipation.  EXAM: ABDOMEN - 2 VIEW  COMPARISON:  12/09/2012 CT.  FINDINGS: Small bowel obstructive pattern with contrast seen in portions of the colon. Gas distended small bowel loops measuring up to 5 cm with thickened folds.  No free intraperitoneal air detected.  IMPRESSION: Small bowel obstructive pattern with contrast seen in portions of the colon. Gas  distended small bowel loops measuring up to 5 cm with thickened folds. No free intraperitoneal air detected.   Electronically Signed   By: Chauncey Cruel   On: 12/10/2012 10:36   Dg Abd Acute W/chest  12/09/2012   CLINICAL DATA:  Abdominal pain, hypertension.  EXAM: ACUTE ABDOMEN SERIES (ABDOMEN 2 VIEW & CHEST 1 VIEW)  COMPARISON:  07/27/2012  FINDINGS: Dilated small bowel loops with scattered air-fluid levels in the abdomen. Findings concerning for small bowel obstruction. Calcifications in the center of the pelvis, likely calcified fibroids. No organomegaly. No free air.  IMPRESSION: Dilated small bowel loops with scattered air-fluid levels concerning for small bowel obstruction.   Electronically Signed   By: Rolm Baptise M.D.   On: 12/09/2012 12:22    Review of Systems  Constitutional: Negative for fever, chills, weight loss, malaise/fatigue and diaphoresis.  HENT: Negative.   Eyes: Negative.   Respiratory: Negative.   Cardiovascular: Negative.   Gastrointestinal: Positive for heartburn (on ppi), nausea (not much), abdominal pain (says she felt a little bloated ) and constipation (occasional). Negative for vomiting, diarrhea, blood in stool and melena.  Genitourinary: Negative.   Musculoskeletal: Negative.   Skin: Negative.   Neurological: Negative.  Negative for weakness.  Endo/Heme/Allergies: Does not bruise/bleed easily.  Psychiatric/Behavioral: Negative.    Blood pressure 138/66, pulse 59, temperature 98.9 F (37.2 C), temperature source Oral, resp. rate 18, height 5\' 6"  (1.676 m), weight 106.867 kg (235 lb 9.6 oz), SpO2 94.00%. Physical Exam  Constitutional: She appears well-developed and well-nourished. No distress.  HENT:  Head: Normocephalic and atraumatic.  Nose: Nose normal.  Eyes: Conjunctivae and EOM are normal. Pupils are equal, round, and reactive to light. Right eye exhibits no discharge. Left eye exhibits no discharge. No scleral icterus.  Neck: Normal range of motion.  Neck supple. No JVD present. No tracheal deviation present. No thyromegaly present.  Cardiovascular: Normal rate.   Lymphadenopathy:    She has no cervical adenopathy.  Skin: She is not diaphoretic.    Assessment/Plan: 1.  Ventral hernia/SBO/incarcerated SB 2.  Afib on chronic coumadin/amiodarone 3. Hypertension 5.  AODM 6. Chronic renal disease 7. Tobacco use 40 years 8.  Dyslipidemia 9.Hx of right breast cancer with lumpectomy and radiation Rx. 10.  BMI 38 11. Anemia on FE  Plan:  She is comfortable, she doesn't even have an NG tube in.  She is passing flatus.  No Bm last 4 days.  I can feel some small bowel within the hernia and I could not get it back in but she's up in the chair.  She is getting her oral meds, and I have ask Dr. Karleen Hampshire to hold up on coumadin till Dr. Barry Dienes has an opportunity to see.  [name] 12/10/2012, 2:27 PM

## 2012-12-10 NOTE — Progress Notes (Signed)
[facility] PROGRESS NOTE  [name] [id number] DOB: 08-23-1933 DOA: 12/09/2012 PCP: Milagros Evener, MD Brief HPI: 79 year-old female with past medical history of diabetes, hypertension, CKD, dyslipidemia who presented to [facility] ED with complaints of worsening abdominal discomfort for past few days prior to this admission associated with nauseas but no vomiting. Pt also reported poor oral intake. Pt rates pain in abdomen 5/10 in intensity in mid abdominal region and non radiating. No aggravating symptoms. She tried miralax but had no BM. No fever or chills. No rectal bleed. No lightheadedness or loss of consciousness.  In ED, vital signs were stable. Abd x ray showed SBO and CT abdomen shows SBO. BMP and CBC were unremarkable. Lipase level was WNL.  Assessment/Plan:  SBO (small bowel obstruction)  - continue conservative management with IV fluids, analgesia and antiemetics as needed  - repeat abd film on 9/19 shows persistent sbo. Surgery consulted , recommended NG tube placement and possible hernia repair this admission.  - lipase level WNL  - keep NPO except for meds  Active Problems:  Constipation  - resolved .  HTN (hypertension)  - controlled. Resume metoprolol , amio and prn hydralazine.  Dyslipidemia  - continue statin therapy on discharge. Atrial fibrillation  - check 12 lead EKG - on amiodarone and metoprolol for rate control  - also on coumadin at home which is being held for surgery and she was started on IV heparin.  CKD, stage 3  - creatinine 1.7 in 2012 and on this admission 1.46  - continue to monitor renal function  Diabetes  - check A1c  - hold oral anti diabetic medications and start her on SSI.   DVT prophylaxis.    Code Status: full code Family Communication: family at bedside, husband and daughter Disposition Plan: pending.    Consultants:  surgery  Procedures: Hernia repair pending. Antibiotics:  none  HPI/Subjective: Comfortable,  able to pass gas, no BM in 3 to 4 days.   Objective: Filed Vitals:   12/10/12 1357  BP: 138/66  Pulse: 59  Temp: 98.9 F (37.2 C)  Resp: 18    Intake/Output Summary (Last 24 hours) at 12/10/12 1539 Last data filed at 12/10/12 1400  Gross per 24 hour  Intake      0 ml  Output    628 ml  Net   -628 ml   Filed Weights   12/09/12 1700  Weight: 106.867 kg (235 lb 9.6 oz)    Exam:   General:  Alert afebrile comfortable  Cardiovascular: s1s2  Respiratory: ctab  Abdomen: soft NT ND BOWEL sounds decreased, umbilical hernia could not be reduced.   Musculoskeletal: LLE edema> RLE  Data Reviewed: Basic Metabolic Panel:  Recent Labs Lab 12/09/12 1038 12/10/12 0431  NA 138 138  K 3.5 3.7  CL 99 102  CO2 28 27  GLUCOSE 203* 141*  BUN 26* 30*  CREATININE 1.46* 1.51*  CALCIUM 9.5 8.6   Liver Function Tests:  Recent Labs Lab 12/09/12 1038 12/10/12 0431  AST 25 28  ALT 28 23  ALKPHOS 45 37*  BILITOT 0.9 0.9  PROT 7.0 5.9*  ALBUMIN 3.2* 2.7*    Recent Labs Lab 12/09/12 1038  LIPASE 26   No results found for this basename: AMMONIA,  in the last 168 hours CBC:  Recent Labs Lab 12/09/12 1038 12/10/12 0431  WBC 9.7 9.1  NEUTROABS 7.3  --   HGB 15.4* 13.4  HCT 45.3 40.4  MCV 77.8* 78.8  PLT 193 180   Cardiac Enzymes: No results found for this basename: CKTOTAL, CKMB, CKMBINDEX, TROPONINI,  in the last 168 hours BNP (last 3 results) No results found for this basename: PROBNP,  in the last 8760 hours CBG:  Recent Labs Lab 12/10/12 0744  GLUCAP 118*    No results found for this or any previous visit (from the past 240 hour(s)).   Studies: Ct Abdomen Pelvis Wo Contrast  12/09/2012   *RADIOLOGY REPORT*  Clinical Data: Lower pelvic pain  CT ABDOMEN AND PELVIS WITHOUT CONTRAST  Technique:  Multidetector CT imaging of the abdomen and pelvis was performed following the standard protocol without intravenous contrast.  Comparison: None.  Findings:  Visualized lung bases appear normal.  Hepatic cyst is noted inferiorly in right hepatic lobe.  The spleen and pancreas appear normal.  No gallstones are noted.  Adrenal glands and kidneys appear normal.  No renal or ureteral calculi are noted. Urinary bladder appears normal.  Calcified fibroids are seen involving the uterus.  Phleboliths are noted in the pelvis. Sigmoid diverticulosis is noted without inflammation.  There is noted a ventral hernia in the right lower quadrant of the abdomen which contains a loop of incarcerated terminal ileum and results in partial obstruction of the more proximal small bowel.  Wall thickening and inflammatory changes are seen involving the more distal terminal ileum extending to the ileocecal valve.  No abnormal fluid collection is noted.  Contrast is noted within the colon as well as stool.  IMPRESSION: Ventral hernia is noted in the right lower quadrant of the anterior abdominal wall which contains a loop of incarcerated terminal ileum, resulting in partial small bowel obstruction and dilatation of the more proximal small bowel.   Original Report Authenticated By: Marijo Conception.,  M.D.   Dg Abd 2 Views  12/10/2012   CLINICAL DATA:  Evaluate for small bowel obstruction. Periumbilical hernia with prior surgery. Now with protruding hernia right abdominal region and occasional constipation.  EXAM: ABDOMEN - 2 VIEW  COMPARISON:  12/09/2012 CT.  FINDINGS: Small bowel obstructive pattern with contrast seen in portions of the colon. Gas distended small bowel loops measuring up to 5 cm with thickened folds.  No free intraperitoneal air detected.  IMPRESSION: Small bowel obstructive pattern with contrast seen in portions of the colon. Gas distended small bowel loops measuring up to 5 cm with thickened folds. No free intraperitoneal air detected.   Electronically Signed   By: Chauncey Cruel   On: 12/10/2012 10:36   Dg Abd Acute W/chest  12/09/2012   CLINICAL DATA:  Abdominal pain,  hypertension.  EXAM: ACUTE ABDOMEN SERIES (ABDOMEN 2 VIEW & CHEST 1 VIEW)  COMPARISON:  07/27/2012  FINDINGS: Dilated small bowel loops with scattered air-fluid levels in the abdomen. Findings concerning for small bowel obstruction. Calcifications in the center of the pelvis, likely calcified fibroids. No organomegaly. No free air.  IMPRESSION: Dilated small bowel loops with scattered air-fluid levels concerning for small bowel obstruction.   Electronically Signed   By: Rolm Baptise M.D.   On: 12/09/2012 12:22    Scheduled Meds: . alum & mag hydroxide-simeth  15 mL Oral Once  . amiodarone  200 mg Oral Daily  . amLODipine  10 mg Oral Daily  . atorvastatin  40 mg Oral q1800  . docusate sodium  100 mg Oral BID  . doxazosin  2 mg Oral QHS  . furosemide  80 mg Oral TID WC  . glipiZIDE  10 mg  Oral Q breakfast  . linagliptin  5 mg Oral Daily  . metolazone  5 mg Oral Custom  . metoprolol  50 mg Oral BID  . multivitamin with minerals  1 tablet Oral Daily  . polyethylene glycol  17 g Oral BID  . potassium chloride  10 mEq Oral BID  . Warfarin - Pharmacist Dosing Inpatient   Does not apply q1800   Continuous Infusions: . sodium chloride 100 mL/hr (12/10/12 1524)    Principal Problem:   SBO (small bowel obstruction) Active Problems:   HTN (hypertension)   Dyslipidemia   Atrial fibrillation   Diabetes   CKD (chronic kidney disease), stage III    Time spent: 30 min    Shahin Knierim  [facility] Pager [phone]. If 7PM-7AM, please contact night-coverage at [email or web address], password [facility] 12/10/2012, 3:39 PM  LOS: 1 day

## 2012-12-11 LAB — CBC
Hemoglobin: 13.5 g/dL (ref 12.0–15.0)
MCH: 26.3 pg (ref 26.0–34.0)
MCV: 79.1 fL (ref 78.0–100.0)
Platelets: 159 10*3/uL (ref 150–400)
RBC: 5.13 MIL/uL — ABNORMAL HIGH (ref 3.87–5.11)
WBC: 8.6 10*3/uL (ref 4.0–10.5)

## 2012-12-11 LAB — PROTIME-INR: Prothrombin Time: 22.8 seconds — ABNORMAL HIGH (ref 11.6–15.2)

## 2012-12-11 LAB — HEPARIN LEVEL (UNFRACTIONATED): Heparin Unfractionated: 0.51 IU/mL (ref 0.30–0.70)

## 2012-12-11 LAB — GLUCOSE, CAPILLARY: Glucose-Capillary: 81 mg/dL (ref 70–99)

## 2012-12-11 MED ORDER — HEPARIN (PORCINE) IN NACL 100-0.45 UNIT/ML-% IJ SOLN
1050.0000 [IU]/h | INTRAMUSCULAR | Status: DC
  Administered 2012-12-11: 1050 [IU]/h via INTRAVENOUS
  Filled 2012-12-11: qty 250

## 2012-12-11 MED ORDER — HEPARIN (PORCINE) IN NACL 100-0.45 UNIT/ML-% IJ SOLN: 1050.0000 [IU]/h | INTRAMUSCULAR | Status: AC

## 2012-12-11 MED ORDER — PHENOL 1.4 % MT LIQD
1.0000 | OROMUCOSAL | Status: DC | PRN
  Filled 2012-12-11: qty 177

## 2012-12-11 MED ORDER — HEPARIN (PORCINE) IN NACL 100-0.45 UNIT/ML-% IJ SOLN
1050.0000 [IU]/h | INTRAMUSCULAR | Status: AC
  Administered 2012-12-11: 1050 [IU]/h via INTRAVENOUS
  Filled 2012-12-11: qty 250

## 2012-12-11 NOTE — Progress Notes (Signed)
ANTICOAGULATION CONSULT NOTE - Follow Up Consult  Pharmacy Consult for Heparin IV Indication: atrial fibrillation  Allergies  Allergen Reactions  . Lotensin [Benazepril Hcl] Anaphylaxis  . Zantac [Ranitidine Hcl] Anaphylaxis    Patient Measurements: Height: 5\' 6"  (167.6 cm) Weight: 235 lb 9.6 oz (106.867 kg) IBW/kg (Calculated) : 59.3 Heparin Dosing Weight: 84 kg  Vital Signs: Temp: 99.4 F (37.4 C) (09/20 0635) Temp src: Oral (09/20 0635) BP: 159/77 mmHg (09/20 0635) Pulse Rate: 72 (09/20 0635)  Labs:  Recent Labs  12/09/12 1038 12/09/12 1554 12/10/12 0431 12/11/12 0100 12/11/12 0845  HGB 15.4*  --  13.4 13.5  --   HCT 45.3  --  40.4 40.6  --   PLT 193  --  180 159  --   LABPROT  --  20.9* 21.2* 22.8*  --   INR  --  1.86* 1.90* 2.09*  --   HEPARINUNFRC  --   --   --  0.51 0.79*  CREATININE 1.46*  --  1.51*  --   --     Estimated Creatinine Clearance: 38.6 ml/min (by C-G formula based on Cr of 1.51).   Medications:  Infusions:  . sodium chloride 100 mL/hr (12/10/12 1524)  . heparin 1,200 Units/hr (12/10/12 1653)    Assessment: 12 yoF on chronic warfarin therapy PTA for atrial fibrillation presents with ventral hernia/SBO/incarcerated SB.  Warfarin on hold (last dose given 9/18) and heparin bridge started 9/19 for possible surgery this admission.   INR 2.09 (increased from 1.9 despite vitamin K 10mg  SQ on 9/19)  Heparin level 0.79 is above goal range.  CBC:  Hgb is stable and wnl.  Plt decreased slightly (180 to 159) but remain wnl.  SCr 1.51 with CrCl ~ 38   Planning for surgery possibly tomorrow; surgery time remains unclear, but will f/u with MD.  Rn reports no bleeding or complications.  Goal of Therapy:  Heparin level 0.3-0.7 units/ml Monitor platelets by anticoagulation protocol: Yes   Plan:   Reduce to heparin IV infusion at 1050 units/hr  Heparin level 8 hours after rate change  Daily INR, heparin level, and CBC  Continue to monitor  platelets and for bleeding  F/U surgery time tomorrow.  Per MD notes, turn off heparin drip tonight (9/21 0330 am).  F/U when to resume warfarin post-op.    Gretta Arab PharmD, BCPS Pager [phone] 12/11/2012 11:25 AM

## 2012-12-11 NOTE — Progress Notes (Signed)
ANTICOAGULATION CONSULT NOTE - Follow Up Consult  Pharmacy Consult for Heparin Indication: atrial fibrillation  Allergies  Allergen Reactions  . Lotensin [Benazepril Hcl] Anaphylaxis  . Zantac [Ranitidine Hcl] Anaphylaxis    Patient Measurements: Height: 5\' 6"  (167.6 cm) Weight: 235 lb 9.6 oz (106.867 kg) IBW/kg (Calculated) : 59.3 Heparin Dosing Weight:   Vital Signs: Temp: 99 F (37.2 C) (09/19 2230) Temp src: Oral (09/19 2230) BP: 144/69 mmHg (09/19 2230) Pulse Rate: 66 (09/19 2230)  Labs:  Recent Labs  12/09/12 1038 12/09/12 1554 12/10/12 0431 12/11/12 0100  HGB 15.4*  --  13.4 13.5  HCT 45.3  --  40.4 40.6  PLT 193  --  180 159  LABPROT  --  20.9* 21.2* 22.8*  INR  --  1.86* 1.90* 2.09*  HEPARINUNFRC  --   --   --  0.51  CREATININE 1.46*  --  1.51*  --     Estimated Creatinine Clearance: 38.6 ml/min (by C-G formula based on Cr of 1.51).   Medications:  Infusions:  . sodium chloride 100 mL/hr (12/10/12 1524)  . heparin 1,200 Units/hr (12/10/12 1653)    Assessment: Patient with heparin level at goal.    Goal of Therapy:  Heparin level 0.3-0.7 units/ml Monitor platelets by anticoagulation protocol: Yes   Plan:  Continue heparin at current rate, recheck level at [name], [name] 12/11/2012,5:50 AM

## 2012-12-11 NOTE — Progress Notes (Signed)
Brief Pharmacy Note - Heparin Follow Up  Labs: heparin level 0.66  A/P: Heparin level therapeutic (goal 0.3-0.7). Continue current rate 10.5 ml/hr. D/C heparin tomorrow AM at 1am as planned for surgery.   Adrian Saran, PharmD, BCPS Pager [phone] 12/11/2012 7:41 PM

## 2012-12-11 NOTE — Progress Notes (Signed)
Physical Therapy Treatment Patient Details Name: RAYAHNA VIEN MRN: [id number] DOB: Mar 16, 1934 Today's Date: 12/11/2012 Time: 1213-1237 PT Time Calculation (min): 24 min  PT Assessment / Plan / Recommendation  History of Present Illness     PT Comments     Follow Up Recommendations  No PT follow up     Does the patient have the potential to tolerate intense rehabilitation     Barriers to Discharge        Equipment Recommendations  None recommended by PT    Recommendations for Other Services OT consult  Frequency Min 3X/week   Progress towards PT Goals Progress towards PT goals: Progressing toward goals  Plan Current plan remains appropriate    Precautions / Restrictions Precautions Precautions: Fall Restrictions Weight Bearing Restrictions: No   Pertinent Vitals/Pain    Mobility  Transfers Transfers: Sit to Stand;Stand to Sit Sit to Stand: 4: Min guard;From chair/3-in-1 Stand to Sit: 4: Min guard;To chair/3-in-1 Details for Transfer Assistance: cues for use of UEs to self assist Ambulation/Gait Ambulation/Gait Assistance: 4: Min guard Ambulation Distance (Feet): 300 Feet Assistive device: Rolling walker Ambulation/Gait Assistance Details: min cues for posture and position from RW Gait Pattern: Step-to pattern;Shuffle;Trunk flexed Gait velocity: decr Stairs: No    Exercises     PT Diagnosis:    PT Problem List:   PT Treatment Interventions:     PT Goals (current goals can now be found in the care plan section) Acute Rehab PT Goals Patient Stated Goal: Resume previous lifestyle asap PT Goal Formulation: With patient Time For Goal Achievement: 12/16/12 Potential to Achieve Goals: Good  Visit Information  Last PT Received On: 12/11/12 Assistance Needed: +1    Subjective Data  Subjective: My back is bothering me Patient Stated Goal: Resume previous lifestyle asap   Cognition  Cognition Arousal/Alertness: Awake/alert Behavior During Therapy: WFL for  tasks assessed/performed Overall Cognitive Status: Within Functional Limits for tasks assessed    Balance     End of Session PT - End of Session Equipment Utilized During Treatment: Gait belt Activity Tolerance: Patient tolerated treatment well;Patient limited by fatigue Patient left: in chair;with call bell/phone within reach;with family/visitor present Nurse Communication: Mobility status   GP     Tris Howell 12/11/2012, 3:41 PM

## 2012-12-11 NOTE — Consult Note (Signed)
Admit date: 12/09/2012 Referring Physician  Dr. Karleen Hampshire Primary Physician Milagros Evener, MD Primary Cardiologist  Dr. Tamala Julian Reason for Consultation  preoperative risk assessment  HPI: 79 year old female with chronic anticoagulation secondary to paroxysmal atrial fibrillation with prior diastolic dysfunction, EF [id number] and has presented with possible incarcerated hernia. Surgery has seen in consultation and is proposing operative therapy tomorrow. INR currently 2.09. On warfarin. FFP is currently being administered.  Presented with abdominal discomfort and currently feels better after NG tube placement. Denies any chest pain, fevers, chills, syncope, orthopnea, PND, bleeding, rashes.    PMH:   Past Medical History  Diagnosis Date  . Hypertension   . Diabetes mellitus without complication   . Irregular heart beat   . Renal disorder     PSH:   Past Surgical History  Procedure Laterality Date  . Hernia repair    . Right lumpectomy    . Cataract extraction, bilateral     Allergies:  Lotensin and Zantac Prior to Admit Meds:   Prior to Admission medications   Medication Sig Start Date End Date Taking? Authorizing Provider  acarbose (PRECOSE) 100 MG tablet Take 100 mg by mouth 3 (three) times daily with meals.   Yes Historical Provider, MD  amiodarone (PACERONE) 200 MG tablet Take 200 mg by mouth daily.   Yes Historical Provider, MD  amLODipine (NORVASC) 10 MG tablet Take 10 mg by mouth daily.   Yes Historical Provider, MD  atorvastatin (LIPITOR) 40 MG tablet Take 40 mg by mouth daily.   Yes Historical Provider, MD  Calcium Carbonate-Vit D-Min (CALCIUM 1200 PO) Take 1,200 mg by mouth daily.   Yes Historical Provider, MD  Cranberry 250 MG CAPS Take 250 mg by mouth daily.   Yes Historical Provider, MD  doxazosin (CARDURA) 4 MG tablet Take 2 mg by mouth at bedtime.   Yes Historical Provider, MD  econazole nitrate 1 % cream Apply 1 application topically daily as needed (to groin rash as needed).    Yes Historical Provider, MD  esomeprazole (NEXIUM) 20 MG capsule Take 40 mg by mouth daily before breakfast.   Yes Historical Provider, MD  ferrous sulfate 325 (65 FE) MG tablet Take 325 mg by mouth daily with breakfast.   Yes Historical Provider, MD  fluticasone (FLONASE) 50 MCG/ACT nasal spray Place 2 sprays into the nose daily as needed for allergies.   Yes Historical Provider, MD  furosemide (LASIX) 80 MG tablet Take 80 mg by mouth 3 (three) times daily.   Yes Historical Provider, MD  glipiZIDE (GLUCOTROL XL) 10 MG 24 hr tablet Take 10 mg by mouth daily.   Yes Historical Provider, MD  metolazone (ZAROXOLYN) 5 MG tablet Take 5 mg by mouth every Monday, Wednesday, and Friday.   Yes Historical Provider, MD  metoprolol (LOPRESSOR) 50 MG tablet Take 50 mg by mouth 2 (two) times daily.   Yes Historical Provider, MD  Multiple Vitamin (MULTIVITAMIN WITH MINERALS) TABS tablet Take 1 tablet by mouth daily.   Yes Historical Provider, MD  potassium chloride (K-DUR,KLOR-CON) 10 MEQ tablet Take 10 mEq by mouth 2 (two) times daily.   Yes Historical Provider, MD  sitaGLIPtin (JANUVIA) 25 MG tablet Take 25 mg by mouth daily.   Yes Historical Provider, MD  warfarin (COUMADIN) 5 MG tablet Take 5 mg by mouth daily with supper.   Yes Historical Provider, MD   Fam HX:   History reviewed. No pertinent family history. Social HX:    History   Social History  .  Marital Status: Married    Spouse Name: N/A    Number of Children: N/A  . Years of Education: N/A   Occupational History  . Not on file.   Social History Main Topics  . Smoking status: Former Smoker    Types: Cigarettes  . Smokeless tobacco: Never Used  . Alcohol Use: No  . Drug Use: No  . Sexual Activity: No   Other Topics Concern  . Not on file   Social History Narrative  . No narrative on file     ROS:  All 11 ROS were addressed and are negative except what is stated in the HPI   Physical Exam: Blood pressure 160/81, pulse 77,  temperature 97.6 F (36.4 C), temperature source Oral, resp. rate 17, height 5\' 6"  (1.676 m), weight 106.867 kg (235 lb 9.6 oz), SpO2 93.00%.   General: Well developed, well nourished, in no acute distress Head: Eyes PERRLA, No xanthomas.   Normal cephalic and atramatic NG tube in place Lungs:   Clear bilaterally to auscultation and percussion. Normal respiratory effort. No wheezes, no rales. Heart:   HRRR S1 S2 Pulses are 2+ & equal. No murmurs, rubs, gallops            No carotid bruit. No JVD.  No abdominal bruits.  Abdomen: Bowel sounds are positive, abdomen soft and non-tender without masses. No hepatosplenomegaly. Msk:  Back normal. Normal strength and tone for age. Extremities:  No clubbing, cyanosis or edema.  DP +1 Neuro: Alert and oriented X 3, non-focal, MAE x 4 GU: Deferred Rectal: Deferred Psych:  Good affect, responds appropriately      Labs: Lab Results  Component Value Date   WBC 8.6 12/11/2012   HGB 13.5 12/11/2012   HCT 40.6 12/11/2012   MCV 79.1 12/11/2012   PLT 159 12/11/2012    Recent Labs Lab 12/10/12 0431  NA 138  K 3.7  CL 102  CO2 27  BUN 30*  CREATININE 1.51*  CALCIUM 8.6  PROT 5.9*  BILITOT 0.9  ALKPHOS 37*  ALT 23  AST 28  GLUCOSE 141*     Radiology:  Ct Abdomen Pelvis Wo Contrast  12/09/2012   *RADIOLOGY REPORT*  Clinical Data: Lower pelvic pain  CT ABDOMEN AND PELVIS WITHOUT CONTRAST  Technique:  Multidetector CT imaging of the abdomen and pelvis was performed following the standard protocol without intravenous contrast.  Comparison: None.  Findings: Visualized lung bases appear normal.  Hepatic cyst is noted inferiorly in right hepatic lobe.  The spleen and pancreas appear normal.  No gallstones are noted.  Adrenal glands and kidneys appear normal.  No renal or ureteral calculi are noted. Urinary bladder appears normal.  Calcified fibroids are seen involving the uterus.  Phleboliths are noted in the pelvis. Sigmoid diverticulosis is noted  without inflammation.  There is noted a ventral hernia in the right lower quadrant of the abdomen which contains a loop of incarcerated terminal ileum and results in partial obstruction of the more proximal small bowel.  Wall thickening and inflammatory changes are seen involving the more distal terminal ileum extending to the ileocecal valve.  No abnormal fluid collection is noted.  Contrast is noted within the colon as well as stool.  IMPRESSION: Ventral hernia is noted in the right lower quadrant of the anterior abdominal wall which contains a loop of incarcerated terminal ileum, resulting in partial small bowel obstruction and dilatation of the more proximal small bowel.   Original Report Authenticated By: Jeneen Rinks  Murlean Caller.,  M.D.   Dg Abd 2 Views  12/10/2012   CLINICAL DATA:  Evaluate for small bowel obstruction. Periumbilical hernia with prior surgery. Now with protruding hernia right abdominal region and occasional constipation.  EXAM: ABDOMEN - 2 VIEW  COMPARISON:  12/09/2012 CT.  FINDINGS: Small bowel obstructive pattern with contrast seen in portions of the colon. Gas distended small bowel loops measuring up to 5 cm with thickened folds.  No free intraperitoneal air detected.  IMPRESSION: Small bowel obstructive pattern with contrast seen in portions of the colon. Gas distended small bowel loops measuring up to 5 cm with thickened folds. No free intraperitoneal air detected.   Electronically Signed   By: Chauncey Cruel   On: 12/10/2012 10:36   Dg Abd Portable 1v  12/10/2012   *RADIOLOGY REPORT*  Clinical Data: NG tube placement.  PORTABLE ABDOMEN - 1 VIEW 5:49 p.m.  Comparison: 12/10/2012 at 9:24 a.m.  Findings: NG tube has been inserted and the tip is in the proximal stomach just below the gastroesophageal junction.  There is air throughout the large and small bowel including slightly distended loops of small bowel in the lower abdomen.  Degenerative changes are present throughout the spine.   IMPRESSION: NG tube tip is in the upper stomach just beyond the gastroesophageal junction.  Persistent slight distention of small bowel.  Contrast has passed into the colon.   Original Report Authenticated By: Lorriane Shire, M.D.     EKG:  Sinus rhythm, first degree AV block, poor R-wave progression. Personally viewed.   Echocardiogram 2009: Ejection fraction 65%, mild mitral regurgitation. Diastolic dysfunction.  Cardiolite 2002-low risk, no ischemia  ASSESSMENT/PLAN:   79 year old female with paroxysmal atrial fibrillation on chronic anticoagulation with possible incarcerated ventral hernia asked to be seen for preoperative risk assessment.  1. Preoperative risk assessment-she did not have any evidence of heart failure currently, no adverse arrhythmias, no evidence of anginal symptoms, prior ejection fraction 65%, normal. Based upon these factors, she may proceed with surgery with low to moderate cardiovascular risk essentially minimally increased because of age and other comorbidities.  2. Chronic anticoagulation/atrial fibrillation paroxysmal-currently in sinus rhythm. Awaiting INR less than 1.5 prior to surgery. Getting FFP. Resume warfarin when able to from surgical perspective  3. Hyperlipidemia-resume statin upon discharge  4. Hypertension - mildly elevated currently. At home on amlodipine 10 mg once a day as well as metoprolol tartrate 50 mg twice a day. Continue with metoprolol injections.  Will follow along with you.    Candee Furbish, MD  12/11/2012  2:31 PM

## 2012-12-11 NOTE — Progress Notes (Signed)
Subjective: Feels better with NG  Objective: Vital signs in last 24 hours: Temp:  [98.9 F (37.2 C)-99.4 F (37.4 C)] 99.4 F (37.4 C) (09/20 0635) Pulse Rate:  [59-72] 72 (09/20 0635) Resp:  [18] 18 (09/20 0635) BP: (138-159)/(66-77) 159/77 mmHg (09/20 0635) SpO2:  [92 %-95 %] 92 % (09/20 0635) Last BM Date: 12/09/12  Intake/Output from previous day: 09/19 0701 - 09/20 0700 In: 1691.4 [I.V.:1671.4; NG/GT:20] Out: 1851 [Urine:1000; Emesis/NG output:250; Stool:1] Intake/Output this shift:    General appearance: alert, cooperative and no distress GI: soft, mild tenderness in area of hernia, partially incarcerated, no peritonitis and no evidence of strangulation, NG with bilious output  Lab Results:   Recent Labs  12/10/12 0431 12/11/12 0100  WBC 9.1 8.6  HGB 13.4 13.5  HCT 40.4 40.6  PLT 180 159   BMET  Recent Labs  12/09/12 1038 12/10/12 0431  NA 138 138  K 3.5 3.7  CL 99 102  CO2 28 27  GLUCOSE 203* 141*  BUN 26* 30*  CREATININE 1.46* 1.51*  CALCIUM 9.5 8.6   PT/INR  Recent Labs  12/10/12 0431 12/11/12 0100  LABPROT 21.2* 22.8*  INR 1.90* 2.09*   ABG No results found for this basename: PHART, PCO2, PO2, HCO3,  in the last 72 hours  Studies/Results: Ct Abdomen Pelvis Wo Contrast  12/09/2012   *RADIOLOGY REPORT*  Clinical Data: Lower pelvic pain  CT ABDOMEN AND PELVIS WITHOUT CONTRAST  Technique:  Multidetector CT imaging of the abdomen and pelvis was performed following the standard protocol without intravenous contrast.  Comparison: None.  Findings: Visualized lung bases appear normal.  Hepatic cyst is noted inferiorly in right hepatic lobe.  The spleen and pancreas appear normal.  No gallstones are noted.  Adrenal glands and kidneys appear normal.  No renal or ureteral calculi are noted. Urinary bladder appears normal.  Calcified fibroids are seen involving the uterus.  Phleboliths are noted in the pelvis. Sigmoid diverticulosis is noted without  inflammation.  There is noted a ventral hernia in the right lower quadrant of the abdomen which contains a loop of incarcerated terminal ileum and results in partial obstruction of the more proximal small bowel.  Wall thickening and inflammatory changes are seen involving the more distal terminal ileum extending to the ileocecal valve.  No abnormal fluid collection is noted.  Contrast is noted within the colon as well as stool.  IMPRESSION: Ventral hernia is noted in the right lower quadrant of the anterior abdominal wall which contains a loop of incarcerated terminal ileum, resulting in partial small bowel obstruction and dilatation of the more proximal small bowel.   Original Report Authenticated By: Marijo Conception.,  M.D.   Dg Abd 2 Views  12/10/2012   CLINICAL DATA:  Evaluate for small bowel obstruction. Periumbilical hernia with prior surgery. Now with protruding hernia right abdominal region and occasional constipation.  EXAM: ABDOMEN - 2 VIEW  COMPARISON:  12/09/2012 CT.  FINDINGS: Small bowel obstructive pattern with contrast seen in portions of the colon. Gas distended small bowel loops measuring up to 5 cm with thickened folds.  No free intraperitoneal air detected.  IMPRESSION: Small bowel obstructive pattern with contrast seen in portions of the colon. Gas distended small bowel loops measuring up to 5 cm with thickened folds. No free intraperitoneal air detected.   Electronically Signed   By: Chauncey Cruel   On: 12/10/2012 10:36   Dg Abd Acute W/chest  12/09/2012   CLINICAL DATA:  Abdominal  pain, hypertension.  EXAM: ACUTE ABDOMEN SERIES (ABDOMEN 2 VIEW & CHEST 1 VIEW)  COMPARISON:  07/27/2012  FINDINGS: Dilated small bowel loops with scattered air-fluid levels in the abdomen. Findings concerning for small bowel obstruction. Calcifications in the center of the pelvis, likely calcified fibroids. No organomegaly. No free air.  IMPRESSION: Dilated small bowel loops with scattered air-fluid levels  concerning for small bowel obstruction.   Electronically Signed   By: Rolm Baptise M.D.   On: 12/09/2012 12:22   Dg Abd Portable 1v  12/10/2012   *RADIOLOGY REPORT*  Clinical Data: NG tube placement.  PORTABLE ABDOMEN - 1 VIEW 5:49 p.m.  Comparison: 12/10/2012 at 9:24 a.m.  Findings: NG tube has been inserted and the tip is in the proximal stomach just below the gastroesophageal junction.  There is air throughout the large and small bowel including slightly distended loops of small bowel in the lower abdomen.  Degenerative changes are present throughout the spine.  IMPRESSION: NG tube tip is in the upper stomach just beyond the gastroesophageal junction.  Persistent slight distention of small bowel.  Contrast has passed into the colon.   Original Report Authenticated By: Lorriane Shire, M.D.    Anti-infectives: Anti-infectives   None      Assessment/Plan: s/p * No surgery found * she has an incarcerated ventral hernia and possible obstruction.  I agree with ventral hernia repair.  I have discussed this with the patient and we will attempt lap ventral hernia repair with mesh and possible open surgery.  I discussed the risks of infection, bleeding, pain, scarring, recurrence, need for open surgery, bowel injury and need for bowel resection and she expressed understanding and desires to proceed with ventral hernia repair with mesh.  I have also spoke with Dr. Karleen Hampshire and she will work on reversing her anticoagulation and optimizing her for surgery.  We will plan for surgery tomorrow if her INR is improved.  Heparin off tonight.  LOS: 2 days    [name] DAVID 12/11/2012

## 2012-12-11 NOTE — Progress Notes (Signed)
[facility] PROGRESS NOTE  [name] [id number] DOB: 03-Jun-1933 DOA: 12/09/2012 PCP: Milagros Evener, MD Brief HPI: 79 year-old female with past medical history of diabetes, hypertension, CKD, dyslipidemia who presented to [facility] ED with complaints of worsening abdominal discomfort for past few days prior to this admission associated with nauseas but no vomiting. Pt also reported poor oral intake. Pt rates pain in abdomen 5/10 in intensity in mid abdominal region and non radiating. No aggravating symptoms. She tried miralax but had no BM. No fever or chills. No rectal bleed. No lightheadedness or loss of consciousness.  In ED, vital signs were stable. Abd x ray showed SBO and CT abdomen shows SBO. BMP and CBC were unremarkable. Lipase level was WNL.  Assessment/Plan:  SBO (small bowel obstruction)  - continue conservative management with IV fluids, analgesia and antiemetics as needed  - repeat abd film on 9/19 shows persistent sbo. Surgery consulted , recommended NG tube placement and possible hernia repair tomorrow am, pending INR.  - lipase level WNL  - keep NPO except for meds  - cardiology consult req for clearance.  Active Problems:  Constipation  - resolved .  HTN (hypertension)  - controlled. Resume metoprolol , amio and prn hydralazine.  Dyslipidemia  - continue statin therapy on discharge. Atrial fibrillation  - on amiodarone and metoprolol for rate control  - also on coumadin at home which is being held for surgery and she was started on IV heparin.  - INR 2, one unit of FFP ordered.   CKD, stage 3  - creatinine 1.7 in 2012 and on this admission 1.46  - continue to monitor renal function  Diabetes  - check A1c is 8.1 - hold oral anti diabetic medications and start her on SSI.   DVT prophylaxis.    Code Status: full code Family Communication: family at bedside, husband and daughter Disposition Plan: pending.     Consultants:  surgery  Procedures: Hernia repair pending. Antibiotics:  none  HPI/Subjective: Comfortable, able to pass gas, no BM in 3 to 4 days.   Objective: Filed Vitals:   12/11/12 1715  BP: 157/80  Pulse: 80  Temp: 97.4 F (36.3 C)  Resp: 16    Intake/Output Summary (Last 24 hours) at 12/11/12 1927 Last data filed at 12/11/12 1854  Gross per 24 hour  Intake 2273.9 ml  Output   1550 ml  Net  723.9 ml   Filed Weights   12/09/12 1700  Weight: 106.867 kg (235 lb 9.6 oz)    Exam:   General:  Alert afebrile comfortable  Cardiovascular: s1s2  Respiratory: ctab  Abdomen: soft NT ND BOWEL sounds decreased, umbilical hernia could not be reduced.   Musculoskeletal: LLE edema> RLE  Data Reviewed: Basic Metabolic Panel:  Recent Labs Lab 12/09/12 1038 12/10/12 0431  NA 138 138  K 3.5 3.7  CL 99 102  CO2 28 27  GLUCOSE 203* 141*  BUN 26* 30*  CREATININE 1.46* 1.51*  CALCIUM 9.5 8.6   Liver Function Tests:  Recent Labs Lab 12/09/12 1038 12/10/12 0431  AST 25 28  ALT 28 23  ALKPHOS 45 37*  BILITOT 0.9 0.9  PROT 7.0 5.9*  ALBUMIN 3.2* 2.7*    Recent Labs Lab 12/09/12 1038  LIPASE 26   No results found for this basename: AMMONIA,  in the last 168 hours CBC:  Recent Labs Lab 12/09/12 1038 12/10/12 0431 12/11/12 0100  WBC 9.7 9.1 8.6  NEUTROABS 7.3  --   --  HGB 15.4* 13.4 13.5  HCT 45.3 40.4 40.6  MCV 77.8* 78.8 79.1  PLT 193 180 159   Cardiac Enzymes: No results found for this basename: CKTOTAL, CKMB, CKMBINDEX, TROPONINI,  in the last 168 hours BNP (last 3 results) No results found for this basename: PROBNP,  in the last 8760 hours CBG:  Recent Labs Lab 12/10/12 0744 12/10/12 2250 12/11/12 0716 12/11/12 1204 12/11/12 1702  GLUCAP 118* 117* 81 80 87    No results found for this or any previous visit (from the past 240 hour(s)).   Studies: Dg Abd 2 Views  12/10/2012   CLINICAL DATA:  Evaluate for small bowel  obstruction. Periumbilical hernia with prior surgery. Now with protruding hernia right abdominal region and occasional constipation.  EXAM: ABDOMEN - 2 VIEW  COMPARISON:  12/09/2012 CT.  FINDINGS: Small bowel obstructive pattern with contrast seen in portions of the colon. Gas distended small bowel loops measuring up to 5 cm with thickened folds.  No free intraperitoneal air detected.  IMPRESSION: Small bowel obstructive pattern with contrast seen in portions of the colon. Gas distended small bowel loops measuring up to 5 cm with thickened folds. No free intraperitoneal air detected.   Electronically Signed   By: Chauncey Cruel   On: 12/10/2012 10:36   Dg Abd Portable 1v  12/10/2012   *RADIOLOGY REPORT*  Clinical Data: NG tube placement.  PORTABLE ABDOMEN - 1 VIEW 5:49 p.m.  Comparison: 12/10/2012 at 9:24 a.m.  Findings: NG tube has been inserted and the tip is in the proximal stomach just below the gastroesophageal junction.  There is air throughout the large and small bowel including slightly distended loops of small bowel in the lower abdomen.  Degenerative changes are present throughout the spine.  IMPRESSION: NG tube tip is in the upper stomach just beyond the gastroesophageal junction.  Persistent slight distention of small bowel.  Contrast has passed into the colon.   Original Report Authenticated By: Lorriane Shire, M.D.    Scheduled Meds: . alum & mag hydroxide-simeth  15 mL Oral Once  . amiodarone  200 mg Oral Daily  . furosemide  40 mg Intravenous BID  . heparin  1,050 Units/hr Intravenous See admin instructions  . insulin aspart  0-5 Units Subcutaneous QHS  . insulin aspart  0-9 Units Subcutaneous TID WC  . metoprolol  2.5 mg Intravenous Q12H  . polyethylene glycol  17 g Oral BID   Continuous Infusions:    Principal Problem:   Umbilical hernia, incarcerated - reducible Active Problems:   SBO (small bowel obstruction)   HTN (hypertension)   Dyslipidemia   Atrial fibrillation    Diabetes   CKD (chronic kidney disease), stage III    Time spent: 30 min    Cantrell Martus  [facility] Pager [phone]. If 7PM-7AM, please contact night-coverage at [email or web address], password [facility] 12/11/2012, 7:27 PM  LOS: 2 days

## 2012-12-12 ENCOUNTER — Encounter (HOSPITAL_COMMUNITY)
Admission: EM | Disposition: A | Discharge status: Skilled Nursing Facility | Payer: Medicare Other | Source: Home / Self Care | Attending: Internal Medicine

## 2012-12-12 ENCOUNTER — Inpatient Hospital Stay (HOSPITAL_COMMUNITY): Payer: Medicare Other | Admitting: Anesthesiology

## 2012-12-12 ENCOUNTER — Encounter (HOSPITAL_COMMUNITY): Payer: Self-pay | Admitting: Anesthesiology

## 2012-12-12 DIAGNOSIS — K432 Incisional hernia without obstruction or gangrene: Secondary | ICD-10-CM

## 2012-12-12 HISTORY — PX: INSERTION OF MESH: SHX5868

## 2012-12-12 HISTORY — PX: LAPAROSCOPIC LYSIS OF ADHESIONS: SHX5905

## 2012-12-12 HISTORY — PX: VENTRAL HERNIA REPAIR: SHX424

## 2012-12-12 LAB — GLUCOSE, CAPILLARY
Glucose-Capillary: 86 mg/dL (ref 70–99)
Glucose-Capillary: 99 mg/dL (ref 70–99)
Glucose-Capillary: 139 mg/dL — ABNORMAL HIGH (ref 70–99)

## 2012-12-12 LAB — CBC
HCT: 42 % (ref 36.0–46.0)
Hemoglobin: 13.7 g/dL (ref 12.0–15.0)
MCH: 25.9 pg — ABNORMAL LOW (ref 26.0–34.0)
MCV: 79.5 fL (ref 78.0–100.0)
RBC: 5.28 MIL/uL — ABNORMAL HIGH (ref 3.87–5.11)

## 2012-12-12 SURGERY — REPAIR, HERNIA, VENTRAL, LAPAROSCOPIC
Anesthesia: General | Site: Abdomen | Wound class: Clean

## 2012-12-12 MED ORDER — LACTATED RINGERS IV SOLN: INTRAVENOUS | Status: DC

## 2012-12-12 MED ORDER — ONDANSETRON HCL 4 MG/2ML IJ SOLN
INTRAMUSCULAR | Status: DC | PRN
  Administered 2012-12-12: 2 mg via INTRAVENOUS

## 2012-12-12 MED ORDER — ACETAMINOPHEN 10 MG/ML IV SOLN
1000.0000 mg | Freq: Once | INTRAVENOUS | Status: DC
  Filled 2012-12-12: qty 100

## 2012-12-12 MED ORDER — SUCCINYLCHOLINE CHLORIDE 20 MG/ML IJ SOLN
INTRAMUSCULAR | Status: DC | PRN
  Administered 2012-12-12: 150 mg via INTRAVENOUS

## 2012-12-12 MED ORDER — DIPHENHYDRAMINE HCL 50 MG/ML IJ SOLN: 12.5000 mg | Freq: Four times a day (QID) | INTRAMUSCULAR | Status: DC | PRN

## 2012-12-12 MED ORDER — PROPOFOL 10 MG/ML IV BOLUS
INTRAVENOUS | Status: DC | PRN
  Administered 2012-12-12: 150 mg via INTRAVENOUS

## 2012-12-12 MED ORDER — LIDOCAINE HCL 1 % IJ SOLN
INTRAMUSCULAR | Status: DC | PRN
  Administered 2012-12-12: 20 mL

## 2012-12-12 MED ORDER — CISATRACURIUM BESYLATE (PF) 10 MG/5ML IV SOLN
INTRAVENOUS | Status: DC | PRN
  Administered 2012-12-12: 10 mg via INTRAVENOUS
  Administered 2012-12-12: 5 mg via INTRAVENOUS

## 2012-12-12 MED ORDER — ONDANSETRON HCL 4 MG/2ML IJ SOLN: 4.0000 mg | Freq: Four times a day (QID) | INTRAMUSCULAR | Status: DC | PRN

## 2012-12-12 MED ORDER — NALOXONE HCL 0.4 MG/ML IJ SOLN: 0.4000 mg | INTRAMUSCULAR | Status: DC | PRN

## 2012-12-12 MED ORDER — GLYCOPYRROLATE 0.2 MG/ML IJ SOLN
INTRAMUSCULAR | Status: DC | PRN
  Administered 2012-12-12: 0.4 mg via INTRAVENOUS

## 2012-12-12 MED ORDER — MORPHINE SULFATE (PF) 1 MG/ML IV SOLN
INTRAVENOUS | Status: DC
  Administered 2012-12-12: 17:00:00 via INTRAVENOUS
  Administered 2012-12-13: 6 mg via INTRAVENOUS
  Filled 2012-12-12: qty 25

## 2012-12-12 MED ORDER — BUPIVACAINE-EPINEPHRINE 0.25% -1:200000 IJ SOLN
INTRAMUSCULAR | Status: DC | PRN
  Administered 2012-12-12: 20 mL

## 2012-12-12 MED ORDER — HYDROMORPHONE HCL PF 1 MG/ML IJ SOLN
INTRAMUSCULAR | Status: AC
  Administered 2012-12-12: 16:00:00
  Filled 2012-12-12: qty 1

## 2012-12-12 MED ORDER — LIDOCAINE-EPINEPHRINE 1 %-1:100000 IJ SOLN
INTRAMUSCULAR | Status: AC
  Filled 2012-12-12: qty 1

## 2012-12-12 MED ORDER — SODIUM CHLORIDE 0.9 % IJ SOLN: 9.0000 mL | INTRAMUSCULAR | Status: DC | PRN

## 2012-12-12 MED ORDER — HYDROMORPHONE HCL PF 1 MG/ML IJ SOLN
0.2500 mg | INTRAMUSCULAR | Status: DC | PRN
  Administered 2012-12-12 (×4): 0.5 mg via INTRAVENOUS

## 2012-12-12 MED ORDER — DIPHENHYDRAMINE HCL 12.5 MG/5ML PO ELIX: 12.5000 mg | ORAL_SOLUTION | Freq: Four times a day (QID) | ORAL | Status: DC | PRN

## 2012-12-12 MED ORDER — LACTATED RINGERS IR SOLN
Status: DC | PRN
  Administered 2012-12-12: 1

## 2012-12-12 MED ORDER — PROMETHAZINE HCL 25 MG/ML IJ SOLN: 6.2500 mg | INTRAMUSCULAR | Status: DC | PRN

## 2012-12-12 MED ORDER — CEFAZOLIN SODIUM-DEXTROSE 2-3 GM-% IV SOLR
2.0000 g | Freq: Once | INTRAVENOUS | Status: AC
  Administered 2012-12-12: 2 g via INTRAVENOUS
  Filled 2012-12-12 (×2): qty 50

## 2012-12-12 MED ORDER — CISATRACURIUM BESYLATE (PF) 10 MG/5ML IV SOLN: INTRAVENOUS | Status: DC | PRN

## 2012-12-12 MED ORDER — SODIUM CHLORIDE 0.9 % IV SOLN
INTRAVENOUS | Status: DC | PRN
  Administered 2012-12-12 (×2): via INTRAVENOUS

## 2012-12-12 MED ORDER — LIDOCAINE HCL (CARDIAC) 20 MG/ML IV SOLN
INTRAVENOUS | Status: DC | PRN
  Administered 2012-12-12: 30 mg via INTRAVENOUS

## 2012-12-12 MED ORDER — 0.9 % SODIUM CHLORIDE (POUR BTL) OPTIME
TOPICAL | Status: DC | PRN
  Administered 2012-12-12: 1000 mL

## 2012-12-12 MED ORDER — NEOSTIGMINE METHYLSULFATE 1 MG/ML IJ SOLN
INTRAMUSCULAR | Status: DC | PRN
  Administered 2012-12-12: 3 mg via INTRAVENOUS

## 2012-12-12 MED ORDER — BUPIVACAINE HCL (PF) 0.25 % IJ SOLN
INTRAMUSCULAR | Status: AC
  Filled 2012-12-12: qty 30

## 2012-12-12 MED ORDER — CEFAZOLIN SODIUM-DEXTROSE 2-3 GM-% IV SOLR
INTRAVENOUS | Status: AC
  Filled 2012-12-12: qty 50

## 2012-12-12 MED ORDER — KETAMINE HCL 10 MG/ML IJ SOLN
INTRAMUSCULAR | Status: DC | PRN
  Administered 2012-12-12 (×3): 5 mg via INTRAVENOUS

## 2012-12-12 MED ORDER — CEFAZOLIN SODIUM-DEXTROSE 2-3 GM-% IV SOLR
2.0000 g | INTRAVENOUS | Status: AC
  Administered 2012-12-12: 2 g via INTRAVENOUS

## 2012-12-12 MED ORDER — POTASSIUM CHLORIDE IN NACL 20-0.9 MEQ/L-% IV SOLN
INTRAVENOUS | Status: DC
  Administered 2012-12-12: 19:00:00 via INTRAVENOUS
  Administered 2012-12-13: 100 mL/h via INTRAVENOUS
  Filled 2012-12-12 (×5): qty 1000

## 2012-12-12 MED ORDER — FENTANYL CITRATE 0.05 MG/ML IJ SOLN
INTRAMUSCULAR | Status: DC | PRN
  Administered 2012-12-12: 75 ug via INTRAVENOUS
  Administered 2012-12-12 (×7): 25 ug via INTRAVENOUS

## 2012-12-12 MED ORDER — HEPARIN (PORCINE) IN NACL 100-0.45 UNIT/ML-% IJ SOLN
1050.0000 [IU]/h | INTRAMUSCULAR | Status: DC
  Administered 2012-12-13: 1050 [IU]/h via INTRAVENOUS
  Filled 2012-12-12 (×3): qty 250

## 2012-12-12 MED ORDER — HYDROMORPHONE HCL PF 1 MG/ML IJ SOLN
INTRAMUSCULAR | Status: DC | PRN
  Administered 2012-12-12: 0.5 mg via INTRAVENOUS

## 2012-12-12 MED ORDER — HYDROMORPHONE HCL PF 1 MG/ML IJ SOLN
INTRAMUSCULAR | Status: AC
  Administered 2012-12-12: 15:00:00 via INTRAVENOUS
  Filled 2012-12-12: qty 1

## 2012-12-12 SURGICAL SUPPLY — 45 items
BINDER ABD UNIV 12 45-62 (WOUND CARE) IMPLANT
BINDER ABDOMINAL 46IN 62IN (WOUND CARE)
BLADE SURG SZ11 CARB STEEL (BLADE) ×2 IMPLANT
CABLE HIGH FREQUENCY MONO STRZ (ELECTRODE) ×2 IMPLANT
CANISTER SUCTION 2500CC (MISCELLANEOUS) ×2 IMPLANT
CLOTH BEACON ORANGE TIMEOUT ST (SAFETY) IMPLANT
DECANTER SPIKE VIAL GLASS SM (MISCELLANEOUS) ×2 IMPLANT
DERMABOND ADVANCED (GAUZE/BANDAGES/DRESSINGS) ×1
DERMABOND ADVANCED .7 DNX12 (GAUZE/BANDAGES/DRESSINGS) ×1 IMPLANT
DEVICE SECURE STRAP 25 ABSORB (INSTRUMENTS) ×4 IMPLANT
DEVICE TROCAR PUNCTURE CLOSURE (ENDOMECHANICALS) ×2 IMPLANT
DRAPE INCISE IOBAN 66X45 STRL (DRAPES) ×2 IMPLANT
DRAPE LAPAROSCOPIC ABDOMINAL (DRAPES) ×2 IMPLANT
ELECT REM PT RETURN 9FT ADLT (ELECTROSURGICAL) ×2
ELECTRODE REM PT RTRN 9FT ADLT (ELECTROSURGICAL) ×1 IMPLANT
GLOVE BIO SURGEON STRL SZ7 (GLOVE) ×2 IMPLANT
GLOVE BIOGEL PI IND STRL 7.0 (GLOVE) ×1 IMPLANT
GLOVE BIOGEL PI INDICATOR 7.0 (GLOVE) ×1
GLOVE SURG SS PI 7.5 STRL IVOR (GLOVE) ×4 IMPLANT
GOWN PREVENTION PLUS LG XLONG (DISPOSABLE) ×4 IMPLANT
GOWN STRL REIN XL XLG (GOWN DISPOSABLE) ×4 IMPLANT
KIT BASIN OR (CUSTOM PROCEDURE TRAY) ×2 IMPLANT
MARKER SKIN DUAL TIP RULER LAB (MISCELLANEOUS) ×2 IMPLANT
MESH VENTRALIGHT ST 8IN CRC (Mesh General) ×2 IMPLANT
NEEDLE SPNL 22GX3.5 QUINCKE BK (NEEDLE) ×2 IMPLANT
NS IRRIG 1000ML POUR BTL (IV SOLUTION) ×2 IMPLANT
SCISSORS LAP 5X35 DISP (ENDOMECHANICALS) ×2 IMPLANT
SET IRRIG TUBING LAPAROSCOPIC (IRRIGATION / IRRIGATOR) ×2 IMPLANT
SOLUTION ANTI FOG 6CC (MISCELLANEOUS) ×2 IMPLANT
STAPLER VISISTAT 35W (STAPLE) ×2 IMPLANT
STRIP CLOSURE SKIN 1/2X4 (GAUZE/BANDAGES/DRESSINGS) IMPLANT
SUT GORETEX NAB #0 THX26 36IN (SUTURE) ×8 IMPLANT
SUT MNCRL AB 4-0 PS2 18 (SUTURE) ×2 IMPLANT
SUT PROLENE 0 CT 1 CR/8 (SUTURE) ×2 IMPLANT
SUT PROLENE 0 SH 30 (SUTURE) ×6 IMPLANT
SUT PROLENE 2 0 SH DA (SUTURE) IMPLANT
TACKER 5MM HERNIA 3.5CML NAB (ENDOMECHANICALS) ×2 IMPLANT
TOWEL OR 17X26 10 PK STRL BLUE (TOWEL DISPOSABLE) ×2 IMPLANT
TRAY FOLEY CATH 14FRSI W/METER (CATHETERS) ×2 IMPLANT
TRAY LAP CHOLE (CUSTOM PROCEDURE TRAY) ×2 IMPLANT
TROCAR BALLN 12MMX100 BLUNT (TROCAR) IMPLANT
TROCAR BLADELESS OPT 5 100 (ENDOMECHANICALS) ×2 IMPLANT
TROCAR BLADELESS OPT 5 75 (ENDOMECHANICALS) ×6 IMPLANT
TROCAR XCEL NON-BLD 11X100MML (ENDOMECHANICALS) ×2 IMPLANT
TUBING INSUFFLATION 10FT LAP (TUBING) ×2 IMPLANT

## 2012-12-12 NOTE — Brief Op Note (Signed)
12/09/2012 - 12/12/2012  2:52 PM  PATIENT:  [name]  79 y.o. female  PRE-OPERATIVE DIAGNOSIS:  ventral hernia  POST-OPERATIVE DIAGNOSIS:  ventral hernia  PROCEDURE:  Procedure(s): LAPAROSCOPIC VENTRAL HERNIA (N/A) INSERTION OF MESH (N/A) LAPAROSCOPIC LYSIS OF ADHESIONS (N/A)  SURGEON:  Surgeon(s) and Role:    * Madilyn Hook, DO - Primary  PHYSICIAN ASSISTANT:   ASSISTANTS: none   ANESTHESIA:   general  EBL:  Total I/O In: 1300 [I.V.:1300] Out: 1075 [Urine:1000; Blood:75]  BLOOD ADMINISTERED:none  DRAINS: none   LOCAL MEDICATIONS USED:  MARCAINE    and LIDOCAINE   SPECIMEN:  No Specimen  DISPOSITION OF SPECIMEN:  N/A  COUNTS:  YES  TOURNIQUET:  * No tourniquets in log *  DICTATION: .Other Dictation: Dictation Number dictated  PLAN OF CARE: Admit to inpatient   PATIENT DISPOSITION:  PACU - hemodynamically stable.   Delay start of Pharmacological VTE agent (>24hrs) due to surgical blood loss or risk of bleeding: yes

## 2012-12-12 NOTE — Anesthesia Postprocedure Evaluation (Signed)
Anesthesia Post Note  Patient: [name]  Procedure(s) Performed: Procedure(s) (LRB): LAPAROSCOPIC VENTRAL HERNIA (N/A) INSERTION OF MESH (N/A) LAPAROSCOPIC LYSIS OF ADHESIONS (N/A)  Anesthesia type: General  Patient location: PACU  Post pain: Pain level controlled  Post assessment: Post-op Vital signs reviewed  Last Vitals:  Filed Vitals:   12/12/12 0635  BP: 171/67  Pulse: 79  Temp: 37.2 C  Resp: 18    Post vital signs: Reviewed  Level of consciousness: sedated  Complications: No apparent anesthesia complications

## 2012-12-12 NOTE — Progress Notes (Addendum)
ANTICOAGULATION CONSULT NOTE - Follow Up Consult  Pharmacy Consult for Heparin IV Indication: atrial fibrillation  Allergies  Allergen Reactions  . Lotensin [Benazepril Hcl] Anaphylaxis  . Zantac [Ranitidine Hcl] Anaphylaxis    Patient Measurements: Height: 5\' 6"  (167.6 cm) Weight: 237 lb 7 oz (107.7 kg) IBW/kg (Calculated) : 59.3 Heparin Dosing Weight: 84 kg  Vital Signs: Temp: 98.9 F (37.2 C) (09/21 0635) Temp src: Oral (09/21 0635) BP: 171/67 mmHg (09/21 0635) Pulse Rate: 79 (09/21 0635)  Labs:  Recent Labs  12/09/12 1038  12/10/12 0431  12/11/12 0100 12/11/12 0845 12/11/12 1840 12/12/12 0550  HGB 15.4*  --  13.4  --  13.5  --   --  13.7  HCT 45.3  --  40.4  --  40.6  --   --  42.0  PLT 193  --  180  --  159  --   --  179  LABPROT  --   < > 21.2*  --  22.8*  --   --  15.2  INR  --   < > 1.90*  --  2.09*  --   --  1.23  HEPARINUNFRC  --   --   --   < > 0.51 0.79* 0.66 <0.10*  CREATININE 1.46*  --  1.51*  --   --   --   --   --   < > = values in this interval not displayed.  Estimated Creatinine Clearance: 38.8 ml/min (by C-G formula based on Cr of 1.51).   Medications: Infusions:     Assessment: 44 yoF on chronic warfarin therapy PTA for atrial fibrillation presents with ventral hernia/SBO/incarcerated SB.  Warfarin on hold (last dose given 9/18) and heparin bridge started 9/19 for surgery this admission.  Pending lap ventral hernia repair on 12/12/12.  INR 1.23 (s/p vitamin K 10mg  SQ on 9/19, and FFP on 9/20)  Heparin level (< 0.1) reflect that heparin was d/c this morning.  CBC:  Hgb and Plt are stable and wnl.    Goal of Therapy:  Heparin level 0.3-0.7 units/ml Monitor platelets by anticoagulation protocol: Yes   Plan:   Continue to hold heparin prior to procedure  WHEN/IF cleared by surgery, resume heparin IV infusion at 1050 units/hr  Daily INR, heparin level, and CBC.  Continue to monitor platelets and for bleeding.  F/U when to resume  warfarin.  Gretta Arab PharmD, BCPS Pager [phone] 12/12/2012 9:24 AM    Addendum: Per MD, OK to resume heparin drip 8 hours post-op (~ 11pm) Plan:  No heparin bolus  Will resume heparin IV drip at previously therapeutic rate of 1050 units/hr.  Heparin level 8 hours after starting  Daily INR, heparin level, and CBC.  Continue to monitor platelets and for bleeding.  F/U when to resume warfarin.  Gretta Arab PharmD, BCPS Pager [phone] 12/12/2012 3:22 PM

## 2012-12-12 NOTE — Anesthesia Preprocedure Evaluation (Signed)
Anesthesia Evaluation  Patient identified by MRN, date of birth, ID band Patient awake    Reviewed: Allergy & Precautions, H&P , NPO status , Patient's Chart, lab work & pertinent test results  Airway       Dental   Pulmonary neg pulmonary ROS,          Cardiovascular hypertension, Pt. on medications and Pt. on home beta blockers + dysrhythmias Atrial Fibrillation     Neuro/Psych negative neurological ROS  negative psych ROS   GI/Hepatic negative GI ROS, Neg liver ROS, SBO secondary incarcerated hernia   Endo/Other  diabetes, Type 2  Renal/GU Renal InsufficiencyRenal disease  negative genitourinary   Musculoskeletal negative musculoskeletal ROS (+)   Abdominal   Peds negative pediatric ROS (+)  Hematology negative hematology ROS (+)   Anesthesia Other Findings   Reproductive/Obstetrics                           Anesthesia Physical Anesthesia Plan  ASA: III  Anesthesia Plan: General   Post-op Pain Management:    Induction: Intravenous, Rapid sequence and Cricoid pressure planned  Airway Management Planned: Oral ETT  Additional Equipment:   Intra-op Plan:   Post-operative Plan: Extubation in OR  Informed Consent: I have reviewed the patients History and Physical, chart, labs and discussed the procedure including the risks, benefits and alternatives for the proposed anesthesia with the patient or authorized representative who has indicated his/her understanding and acceptance.   Dental advisory given  Plan Discussed with: CRNA  Anesthesia Plan Comments:         Anesthesia Quick Evaluation

## 2012-12-12 NOTE — Progress Notes (Signed)
  Subjective: No new issues  Objective: Vital signs in last 24 hours: Temp:  [97.4 F (36.3 C)-98.9 F (37.2 C)] 98.9 F (37.2 C) (09/21 0635) Pulse Rate:  [73-80] 79 (09/21 0635) Resp:  [16-18] 18 (09/21 0635) BP: (149-177)/(67-81) 171/67 mmHg (09/21 0635) SpO2:  [92 %-96 %] 95 % (09/21 0635) Weight:  [237 lb 7 oz (107.7 kg)] 237 lb 7 oz (107.7 kg) (09/21 0635) Last BM Date: 12/09/12  Intake/Output from previous day: 09/20 0701 - 09/21 0700 In: 582.5 [Blood:332.5; NG/GT:250] Out: 1500 [Urine:1500] Intake/Output this shift:    General appearance: alert, cooperative and no distress GI: soft, not much tenderness, ND, NG with bilious output  Lab Results:   Recent Labs  12/11/12 0100 12/12/12 0550  WBC 8.6 8.0  HGB 13.5 13.7  HCT 40.6 42.0  PLT 159 179   BMET  Recent Labs  12/09/12 1038 12-28-2012 0431  NA 138 138  K 3.5 3.7  CL 99 102  CO2 28 27  GLUCOSE 203* 141*  BUN 26* 30*  CREATININE 1.46* 1.51*  CALCIUM 9.5 8.6   PT/INR  Recent Labs  12/11/12 0100 12/12/12 0550  LABPROT 22.8* 15.2  INR 2.09* 1.23   ABG No results found for this basename: PHART, PCO2, PO2, HCO3,  in the last 72 hours  Studies/Results: Dg Abd 2 Views  12-28-2012   CLINICAL DATA:  Evaluate for small bowel obstruction. Periumbilical hernia with prior surgery. Now with protruding hernia right abdominal region and occasional constipation.  EXAM: ABDOMEN - 2 VIEW  COMPARISON:  12/09/2012 CT.  FINDINGS: Small bowel obstructive pattern with contrast seen in portions of the colon. Gas distended small bowel loops measuring up to 5 cm with thickened folds.  No free intraperitoneal air detected.  IMPRESSION: Small bowel obstructive pattern with contrast seen in portions of the colon. Gas distended small bowel loops measuring up to 5 cm with thickened folds. No free intraperitoneal air detected.   Electronically Signed   By: Chauncey Cruel   On: December 28, 2012 10:36   Dg Abd Portable 1v  12/28/12    *RADIOLOGY REPORT*  Clinical Data: NG tube placement.  PORTABLE ABDOMEN - 1 VIEW 5:49 p.m.  Comparison: 12/28/12 at 9:24 a.m.  Findings: NG tube has been inserted and the tip is in the proximal stomach just below the gastroesophageal junction.  There is air throughout the large and small bowel including slightly distended loops of small bowel in the lower abdomen.  Degenerative changes are present throughout the spine.  IMPRESSION: NG tube tip is in the upper stomach just beyond the gastroesophageal junction.  Persistent slight distention of small bowel.  Contrast has passed into the colon.   Original Report Authenticated By: Lorriane Shire, M.D.    Anti-infectives: Anti-infectives   None      Assessment/Plan: s/p Procedure(s): LAPAROSCOPIC VENTRAL HERNIA (N/A) INR better.  plan for OR today for lap ventral hernia repair with mesh.    LOS: 3 days    [name] DAVID 12/12/2012

## 2012-12-12 NOTE — Progress Notes (Signed)
Subjective:  Trouble sleeping. No CP, no SOB. NGT. NPO  Objective:  Vital Signs in the last 24 hours: Temp:  [97.4 F (36.3 C)-98.9 F (37.2 C)] 98.9 F (37.2 C) (09/21 0635) Pulse Rate:  [73-80] 79 (09/21 0635) Resp:  [16-18] 18 (09/21 0635) BP: (149-177)/(67-81) 171/67 mmHg (09/21 0635) SpO2:  [92 %-96 %] 95 % (09/21 0635) Weight:  [107.7 kg (237 lb 7 oz)] 107.7 kg (237 lb 7 oz) (09/21 [id number])  Intake/Output from previous day: 09/20 0701 - 09/21 0700 In: 582.5 [Blood:332.5; NG/GT:250] Out: 1500 [Urine:1500]   Physical Exam: General: Well developed, well nourished, in no acute distress. Head:  Normocephalic and atraumatic. NGT Lungs: Clear to auscultation and percussion. Heart: Normal S1 and S2.  2/6 S murmur, no rubs or gallops.  Abdomen: soft, non-tender, positive bowel sounds. Extremities: No clubbing or cyanosis. No edema. Neurologic: Alert and oriented x 3.    Lab Results:  Recent Labs  12/11/12 0100 12/12/12 0550  WBC 8.6 8.0  HGB 13.5 13.7  PLT 159 179    Recent Labs  12/09/12 1038 12/10/12 0431  NA 138 138  K 3.5 3.7  CL 99 102  CO2 28 27  GLUCOSE 203* 141*  BUN 26* 30*  CREATININE 1.46* 1.51*   No results found for this basename: TROPONINI, CK, MB,  in the last 72 hours Hepatic Function Panel  Recent Labs  12/10/12 0431  PROT 5.9*  ALBUMIN 2.7*  AST 28  ALT 23  ALKPHOS 37*  BILITOT 0.9   Imaging: Dg Abd 2 Views  12/10/2012   CLINICAL DATA:  Evaluate for small bowel obstruction. Periumbilical hernia with prior surgery. Now with protruding hernia right abdominal region and occasional constipation.  EXAM: ABDOMEN - 2 VIEW  COMPARISON:  12/09/2012 CT.  FINDINGS: Small bowel obstructive pattern with contrast seen in portions of the colon. Gas distended small bowel loops measuring up to 5 cm with thickened folds.  No free intraperitoneal air detected.  IMPRESSION: Small bowel obstructive pattern with contrast seen in portions of the colon. Gas  distended small bowel loops measuring up to 5 cm with thickened folds. No free intraperitoneal air detected.   Electronically Signed   By: Chauncey Cruel   On: 12/10/2012 10:36   Dg Abd Portable 1v  12/10/2012   *RADIOLOGY REPORT*  Clinical Data: NG tube placement.  PORTABLE ABDOMEN - 1 VIEW 5:49 p.m.  Comparison: 12/10/2012 at 9:24 a.m.  Findings: NG tube has been inserted and the tip is in the proximal stomach just below the gastroesophageal junction.  There is air throughout the large and small bowel including slightly distended loops of small bowel in the lower abdomen.  Degenerative changes are present throughout the spine.  IMPRESSION: NG tube tip is in the upper stomach just beyond the gastroesophageal junction.  Persistent slight distention of small bowel.  Contrast has passed into the colon.   Original Report Authenticated By: Lorriane Shire, M.D.   Cardiac Studies:  Normal EF  Assessment/Plan:  Principal Problem:   Umbilical hernia, incarcerated - reducible Active Problems:   SBO (small bowel obstruction)   HTN (hypertension)   Dyslipidemia   Atrial fibrillation   Diabetes   CKD (chronic kidney disease), stage III   -await surgery -NPO -see prior consult note -INR <1.5 (FFP) -resume coumadin when able   SKAINS, MARK 12/12/2012, 7:49 AM

## 2012-12-12 NOTE — Transfer of Care (Signed)
Immediate Anesthesia Transfer of Care Note  Patient: [name]  Procedure(s) Performed: Procedure(s): LAPAROSCOPIC VENTRAL HERNIA (N/A) INSERTION OF MESH (N/A) LAPAROSCOPIC LYSIS OF ADHESIONS (N/A)  Patient Location: PACU  Anesthesia Type:General  Level of Consciousness: awake, oriented and patient cooperative  Airway & Oxygen Therapy: Patient Spontanous Breathing and Patient connected to face mask oxygen  Post-op Assessment: Report given to PACU RN and Post -op Vital signs reviewed and stable  Post vital signs: stable  Complications: No apparent anesthesia complications

## 2012-12-12 NOTE — Progress Notes (Signed)
[facility] PROGRESS NOTE  IAYANA FOXX [id number] DOB: [birth year] DOA: 12/09/2012 PCP: Milagros Evener, MD Brief HPI: 79 year-old female with past medical history of diabetes, hypertension, CKD, dyslipidemia who presented to [facility] ED with complaints of worsening abdominal discomfort for past few days prior to this admission associated with nauseas but no vomiting. Pt also reported poor oral intake. Pt rates pain in abdomen 5/10 in intensity in mid abdominal region and non radiating. No aggravating symptoms. She tried miralax but had no BM. No fever or chills. No rectal bleed. No lightheadedness or loss of consciousness.  In ED, vital signs were stable. Abd x ray showed SBO and CT abdomen shows SBO. BMP and CBC were unremarkable. Lipase level was WNL.  Assessment/Plan:  SBO (small bowel obstruction)  - continue conservative management with IV fluids, analgesia and antiemetics as needed  - repeat abd film on 9/19 shows persistent sbo. Surgery consulted , recommended NG tube placement and possible hernia repair today,.  - lipase level WNL  - keep NPO except for meds  - cardiology consult req for clearance.  Active Problems:  Constipation  - resolved .  HTN (hypertension)  - controlled. Resume metoprolol , amio and prn hydralazine.  Dyslipidemia  - continue statin therapy on discharge. Atrial fibrillation  - on amiodarone and metoprolol for rate control  - also on coumadin at home which is being held for surgery and she was started on IV heparin.  -, one unit of FFP ordered and INR is 1.2, .   CKD, stage 3  - creatinine 1.7 in 2012 and on this admission 1.46  - continue to monitor renal function  Diabetes  - check A1c is 8.1 - hold oral anti diabetic medications and start her on SSI.   DVT prophylaxis.    Code Status: full code Family Communication: family at bedside, husband and daughter Disposition Plan: pending.     Consultants:  Surgery  cardiology  Procedures: Hernia repair today Antibiotics:  none  HPI/Subjective: Comfortable,.  Objective: Filed Vitals:   12/12/12 1717  BP:   Pulse:   Temp:   Resp: 12    Intake/Output Summary (Last 24 hours) at 12/12/12 1842 Last data filed at 12/12/12 1543  Gross per 24 hour  Intake   1750 ml  Output   1975 ml  Net   -225 ml   Filed Weights   12/09/12 1700 12/12/12 0635  Weight: 106.867 kg (235 lb 9.6 oz) 107.7 kg (237 lb 7 oz)    Exam:   General:  Alert afebrile comfortable  Cardiovascular: s1s2  Respiratory: ctab  Abdomen: soft NT ND BOWEL sounds decreased, umbilical hernia could not be reduced.   Musculoskeletal: LLE edema> RLE  Data Reviewed: Basic Metabolic Panel:  Recent Labs Lab 12/09/12 1038 12/10/12 0431  NA 138 138  K 3.5 3.7  CL 99 102  CO2 28 27  GLUCOSE 203* 141*  BUN 26* 30*  CREATININE 1.46* 1.51*  CALCIUM 9.5 8.6   Liver Function Tests:  Recent Labs Lab 12/09/12 1038 12/10/12 0431  AST 25 28  ALT 28 23  ALKPHOS 45 37*  BILITOT 0.9 0.9  PROT 7.0 5.9*  ALBUMIN 3.2* 2.7*    Recent Labs Lab 12/09/12 1038  LIPASE 26   No results found for this basename: AMMONIA,  in the last 168 hours CBC:  Recent Labs Lab 12/09/12 1038 12/10/12 0431 12/11/12 0100 12/12/12 0550  WBC 9.7 9.1 8.6 8.0  NEUTROABS 7.3  --   --   --  HGB 15.4* 13.4 13.5 13.7  HCT 45.3 40.4 40.6 42.0  MCV 77.8* 78.8 79.1 79.5  PLT 193 180 159 179   Cardiac Enzymes: No results found for this basename: CKTOTAL, CKMB, CKMBINDEX, TROPONINI,  in the last 168 hours BNP (last 3 results) No results found for this basename: PROBNP,  in the last 8760 hours CBG:  Recent Labs Lab 12/11/12 1702 12/12/12 0036 12/12/12 0815 12/12/12 1455 12/12/12 1704  GLUCAP 87 101* 86 99 139*    No results found for this or any previous visit (from the past 240 hour(s)).   Studies: No results found.  Scheduled Meds: .  amiodarone  200 mg Oral Daily  .  ceFAZolin (ANCEF) IV  2 g Intravenous Once  . furosemide  40 mg Intravenous BID  . insulin aspart  0-5 Units Subcutaneous QHS  . insulin aspart  0-9 Units Subcutaneous TID WC  . metoprolol  2.5 mg Intravenous Q12H  . morphine   Intravenous Q4H   Continuous Infusions: . 0.9 % NaCl with KCl 20 mEq / L 100 mL/hr at 12/12/12 1830  . heparin      Principal Problem:   Umbilical hernia, incarcerated - reducible Active Problems:   SBO (small bowel obstruction)   HTN (hypertension)   Dyslipidemia   Atrial fibrillation   Diabetes   CKD (chronic kidney disease), stage III    Time spent: 30 min    Alistar Mcenery  [facility] Pager [phone]. If 7PM-7AM, please contact night-coverage at [email or web address], password [facility] 12/12/2012, 6:42 PM  LOS: 3 days

## 2012-12-13 ENCOUNTER — Encounter (HOSPITAL_COMMUNITY): Payer: Self-pay | Admitting: General Surgery

## 2012-12-13 LAB — PREPARE FRESH FROZEN PLASMA: Unit division: 0

## 2012-12-13 LAB — CBC
HCT: 39.7 % (ref 36.0–46.0)
MCH: 25.8 pg — ABNORMAL LOW (ref 26.0–34.0)
MCHC: 32 g/dL (ref 30.0–36.0)
MCV: 80.7 fL (ref 78.0–100.0)
Platelets: 168 10*3/uL (ref 150–400)
RBC: 4.92 MIL/uL (ref 3.87–5.11)
RDW: 14 % (ref 11.5–15.5)

## 2012-12-13 LAB — PROTIME-INR
INR: 1.26 (ref 0.00–1.49)
Prothrombin Time: 15.5 seconds — ABNORMAL HIGH (ref 11.6–15.2)

## 2012-12-13 LAB — GLUCOSE, CAPILLARY
Glucose-Capillary: 138 mg/dL — ABNORMAL HIGH (ref 70–99)
Glucose-Capillary: 151 mg/dL — ABNORMAL HIGH (ref 70–99)
Glucose-Capillary: 144 mg/dL — ABNORMAL HIGH (ref 70–99)
Glucose-Capillary: 160 mg/dL — ABNORMAL HIGH (ref 70–99)

## 2012-12-13 LAB — HEPARIN LEVEL (UNFRACTIONATED): Heparin Unfractionated: 0.31 IU/mL (ref 0.30–0.70)

## 2012-12-13 MED ORDER — MORPHINE SULFATE 2 MG/ML IJ SOLN
2.0000 mg | INTRAMUSCULAR | Status: DC | PRN
  Administered 2012-12-14 – 2012-12-20 (×14): 2 mg via INTRAVENOUS
  Filled 2012-12-13 (×15): qty 1

## 2012-12-13 NOTE — Progress Notes (Addendum)
Patient Name: [name] Date of Encounter: 12/13/2012    SUBJECTIVE: No cardiac complaints. She denies dyspnea.  TELEMETRY:  Maintaining normal sinus rhythm: Filed Vitals:   12/13/12 0000 12/13/12 0210 12/13/12 0400 12/13/12 0600  BP:  164/67  167/68  Pulse:  87  93  Temp:  99.6 F (37.6 C)  100.3 F (37.9 C)  TempSrc:  Oral  Oral  Resp: 16 16 12 16   Height:      Weight:      SpO2: 100% 98% 97% 93%    Intake/Output Summary (Last 24 hours) at 12/13/12 0656 Last data filed at 12/13/12 0600  Gross per 24 hour  Intake 3973.5 ml  Output   2025 ml  Net 1948.5 ml   NET I/O: +3200 cc since admission  LABS: BMET    Component Value Date/Time   NA 138 12/10/2012 0431   K 3.7 12/10/2012 0431   CL 102 12/10/2012 0431   CO2 27 12/10/2012 0431   GLUCOSE 141* 12/10/2012 0431   BUN 30* 12/10/2012 0431   CREATININE 1.51* 12/10/2012 0431   CALCIUM 8.6 12/10/2012 0431   GFRNONAA 32* 12/10/2012 0431   GFRAA 37* 12/10/2012 0431    CBC:  Recent Labs  12/11/12 0100 12/12/12 0550  WBC 8.6 8.0  HGB 13.5 13.7  HCT 40.6 42.0  MCV 79.1 79.5  PLT 159 179   BNP (last 3 results) No results found for this basename: PROBNP,  in the last 8760 hours  Radiology/Studies:  No chest X ray  Physical Exam: Blood pressure 167/68, pulse 93, temperature 100.3 F (37.9 C), temperature source Oral, resp. rate 16, height 5\' 6"  (1.676 m), weight 107.7 kg (237 lb 7 oz), SpO2 93.00%. Weight change:    Regular rhythm Chest clear Extremities with no edema  ASSESSMENT: 1. PAF with no recurrence during the hospital stay. 2. S/p Ventral hernia repair 3. Diastolic dysfunction  Plan:  1. Continue amiodarone maintenance dose 2. Call if we can help. 3. Resume anticoagulation when possible.  Demetrios Isaacs 12/13/2012, 6:56 AM

## 2012-12-13 NOTE — Progress Notes (Signed)
Pharmacy: Brief Heparin Note  Heparin level (0.44) is within goal range this afternoon on current rate.  No bleeding/issues noted.    Plan: 1.) Continue heparin at current rate of 1050 units/hr (10.5 mL/hr) 2.) F/u Daily heparin level in AM  Radha Coggins, Gaye Alken PharmD Pager #: [phone] 4:21 PM 12/13/2012

## 2012-12-13 NOTE — Op Note (Signed)
NAMECYNDIE, [name] NO.:  [id number]  MEDICAL RECORD NO.:  [id number]  LOCATION:  [id number]                         FACILITY:  [facility]  PHYSICIAN:  Madilyn Hook, MD       DATE OF BIRTH:  07/11/1933  DATE OF PROCEDURE:  12/12/2012 DATE OF DISCHARGE:                              OPERATIVE REPORT   PROCEDURE:  Laparoscopic lysis of adhesions with laparoscopic repair of recurrent ventral hernia.  PREOPERATIVE DIAGNOSES:  Incarcerated and obstructed ventral hernia.  POSTOPERATIVE DIAGNOSES:  Incarcerated and obstructed ventral hernia.  SURGEON:  Madilyn Hook, MD  ASSISTANT:  None.  ANESTHESIA:  General endotracheal anesthesia with 25 mL of 1% lidocaine with epinephrine and 0.25% Marcaine in a 50:50 mixture.  FLUIDS:  1300 mL of crystalloid.  ESTIMATED BLOOD LOSS:  Minimal.  DRAINS:  None.  SPECIMENS:  Prior abdominal wall mesh removed, but not sent to Pathology.  COMPLICATIONS:  None apparent.  FINDINGS:  A 6 cm x 6 cm fascial defect near the umbilicus with small intestine adhered to the prior umbilical hernia mesh as well as small- bowel adhesions to the abdominal wall with incarcerated fat.  Ventral hernia repair with 20 cm x 1 cm intra-abdominal mesh.  OPERATIVE DETAILS:  Ms. Marchi was seen and evaluated in the surgical ward and risks and benefits of the procedure were discussed in lay terms.  Informed consent was obtained.  Her heparin had been stopped and her INR is now down to 1.2, and she was given prophylactic antibiotics and taken to the operating room and placed on the table in supine position.  General endotracheal anesthesia obtained and Foley catheter was placed.  Her abdomen was prepped and draped in a standard surgical fashion and her arms were tucked bilaterally.  Her abdomen was prepped and draped in a standard surgical fashion.  An Ioban drape was placed to minimize skin contact.  A 5-mm Optiview trocar was used to access the peritoneum  in the left upper quadrant on the first attempt and pneumoperitoneum was obtained and laparoscope was introduced and there was no evidence of any bleeding or bowel injury upon entry.  She was noted to have adhesions to the small bowel and colon in the area of her umbilicus and these were structurally fairly close and densely to the abdominal wall and well was later to be discovered the prior umbilical hernia mesh.  I placed an left lateral abdominal trocar.  The 5-mm left lower quadrant trocar, all under direct visualization and with these trocars, I was able to sharply dissected the small bowel and fat from the abdominal wall and from the prior hernia defect.  The colon was also adherent to this area, though the colon was not as densely adherent. She had a loop of small bowel, which was stuck to the undersurface of the prior mesh, and using only sharp dissection without any electrocautery, these adhesions were taken down, with care taken to avoid injury to the bowel wall.  I eventually was able to work all the adhesions down and reduced all the hernia contents leaving a 6 cm x 6 cm defect, which was measured intraperitoneal.  I inspected the small  intestine as it appeared to be dilated pretty much uniformly throughout the abdomen.  The proximal small bowel was dilated consistent with a bowel obstruction, but most of the entire small bowel was dilated as the terminal ileum was adhered to this hernia as well.  I inspected the area of the bowel and the area of adhesiolysis, and there was no evidence of any bowel injury.  I ran the small bowel from the ileocecal valve proximally and I did not see any other area of obstruction.  I decided to place my mesh and I selected a 20 cm x 20 cm circular piece of mesh. Four 0 Gore-Tex sutures were placed at the 12 o'clock, 3 o'clock, 6 o'clock, and 9 o'clock positions of the mesh, and the mesh was rolled and placed into the abdomen.  I rolled the mesh  and brought up the 4 sutures through the abdominal wall with the laparoscopic suture passer, ensuring wide overlap of the hernia defect.  I started in the lower abdomen and brought up the suture first.  I took down the falciform ligament using Bovie electrocautery in order to get the mesh to lie flat on the abdominal wall.  Brought up the lateral sutures and the mesh appeared to lie flat and cover widely all hernia defects and the sutures were secured.  At this point, I used the AbsorbaTack device to fix the edge of the mesh circumferentially, taking care to avoid stretching the mesh to one side versus the other.  The tacks were placed at short intervals circumferentially around the edge of the mesh to minimize chance of bowel herniation up around the edge of the mesh.  With the mesh fixed to the abdominal wall, I continued to place transfascial sutures, placing an additional 8 transfascial sutures around the periphery of the mesh approximately every 5 cm with 2 additional sutures in each quadrant using 0 Prolene sutures placed transvaginally through separate stab incision.  Sutures were secured and there was no evidence of abdominal wall bleeding.  The mesh appeared to lie flat and cover all hernia defects, and otherwise, it should be noted that I dropped the pressure to 10 mmHg __________trying to get the mesh to lie flat across the abdomen.  The mesh did look very nice and covered widely the defect, and with the mesh in place, I again inspected the abdominal wall for hemostasis, which was noted be adequate.  I ran the small bowel, again looking for any bowel injury and none was identified especially in the area  which was adhered to the abdominal wall.  Then, I closed the 11-mm port site using a 0 Vicryl suture and the EndoClose device, and all the skin incisions were approximated with 4-0 Monocryl subcuticular suture. Skin was washed and dried and Dermabond was applied, closing all  the transfascial suture sites, and trocar sites.  There was good cosmesis and there was no dimpling of the skin at the time of the end of the procedure.  The Foley catheter was left in place.  All sponge, needle, and instrument counts were correct at the end of the case.  The patient tolerated the procedure well without apparent complications.          ______________________________ Madilyn Hook, MD     BL/MEDQ  D:  12/12/2012  T:  12/13/2012  Job:  [id number]

## 2012-12-13 NOTE — Progress Notes (Signed)
1 Day Post-Op  Subjective: Pt feeling okay, 6mg  of morphine overnight, pain only with turning.  Has not been oob.  No flatus.  No n/v.  Febrile this AM.  Objective: Vital signs in last 24 hours: Temp:  [97.5 F (36.4 C)-101 F (38.3 C)] 99.5 F (37.5 C) (09/22 1132) Pulse Rate:  [67-95] 95 (09/22 0948) Resp:  [12-25] 24 (09/22 1132) BP: (143-173)/(56-91) 157/68 mmHg (09/22 0948) SpO2:  [93 %-100 %] 96 % (09/22 1132) FiO2 (%):  [96 %] 96 % (09/21 1717) Last BM Date: 12/09/12  Intake/Output from previous day: 09/21 0701 - 09/22 0700 In: 3973.5 [I.V.:3973.5] Out: 2725 [Urine:2600; Emesis/NG output:50; Blood:75] Intake/Output this shift:    General appearance: alert, cooperative, appears stated age, no distress and moderately obese Cardio: regular rate and rhythm, S1, S2 normal, no murmur, click, rub or gallop GI: +bs soft round, mildly distended.  Incisions are clean dry and intact.  Abd binder.  No evidence of peritonitis.    Lab Results:   Recent Labs  12/11/12 0100 12/12/12 0550  WBC 8.6 8.0  HGB 13.5 13.7  HCT 40.6 42.0  PLT 159 179   PT/INR  Recent Labs  12/12/12 0550 12/13/12 0650  LABPROT 15.2 15.5*  INR 1.23 1.26    Anti-infectives: Anti-infectives   Start     Dose/Rate Route Frequency Ordered Stop   12/12/12 1800  ceFAZolin (ANCEF) IVPB 2 g/50 mL premix     2 g 100 mL/hr over 30 Minutes Intravenous  Once 12/12/12 1638 12/12/12 1900   12/12/12 1126  ceFAZolin (ANCEF) IVPB 2 g/50 mL premix     2 g 100 mL/hr over 30 Minutes Intravenous 30 min pre-op 12/12/12 1122 12/12/12 1140      Assessment/Plan: Incarcerated and obstructed ventral hernia  s/p Ex laparoscopy with LOA( Dr. Lilyan Punt 9/22) -DC PCA, IVP, start PO meds when bowel function returns -continue with NGT, NPO until bowel function returns demonstrate and reinforce IS Use -mobilize  -intake and output, not sure of NGT output but cannister appears bilious with 788ml -apply abdominal binder     LOS: 4 days    Oneida Arenas [facility] ANP-BC Pager [phone]  12/13/2012 1:36 PM

## 2012-12-13 NOTE — Progress Notes (Signed)
[facility] PROGRESS NOTE  [name] [id number] DOB: 04/20/1933 DOA: 12/09/2012 PCP: Melanie Evener, MD Brief HPI: 79 year-old female with past medical history of diabetes, hypertension, CKD, dyslipidemia who presented to [facility] ED with complaints of worsening abdominal discomfort for past few days prior to this admission associated with nauseas but no vomiting. Pt also reported poor oral intake. Pt rates pain in abdomen 5/10 in intensity in mid abdominal region and non radiating. No aggravating symptoms. She tried miralax but had no BM. No fever or chills. No rectal bleed. No lightheadedness or loss of consciousness.  In ED, vital signs were stable. Abd x ray showed SBO and CT abdomen shows SBO. BMP and CBC were unremarkable. Lipase level was WNL.  Assessment/Plan:  SBO (small bowel obstruction)  - continue conservative management with IV fluids, analgesia and antiemetics as needed  - repeat abd film on 9/19 shows persistent sbo. Surgery consulted , recommended NG tube placement and possible hernia repair. She underwent surgery for incarcerated and obstructed  Ventral hernia with LOA. She is currently NPO and has NG TUBE on intermittent suction.   - further recommendations as per surgery.  Active Problems:  Constipation  HTN (hypertension)  - controlled. Resume metoprolol , amio and prn hydralazine.  Dyslipidemia  - continue statin therapy on discharge. Atrial fibrillation  - on amiodarone and metoprolol for rate control  - also on coumadin at home which is being held for surgery and she was started on IV heparin.  CKD, stage 3  - creatinine 1.7 in 2012 and on this admission 1.46  - continue to monitor renal function  Diabetes  - check A1c is 8.1 - hold oral anti diabetic medications and start her on SSI.   DVT prophylaxis.    Code Status: full code Family Communication: none at bedside Disposition Plan: pending.     Consultants:  Surgery  cardiology  Procedures: Hernia repair today Antibiotics:  none  HPI/Subjective: Comfortable,.  Objective: Filed Vitals:   12/13/12 1427  BP: 147/48  Pulse: 90  Temp: 99.4 F (37.4 C)  Resp: 20    Intake/Output Summary (Last 24 hours) at 12/13/12 1814 Last data filed at 12/13/12 1428  Gross per 24 hour  Intake 2473.5 ml  Output   2100 ml  Net  373.5 ml   Filed Weights   12/09/12 1700 12/12/12 0635  Weight: 106.867 kg (235 lb 9.6 oz) 107.7 kg (237 lb 7 oz)    Exam:   General:  Alert afebrile comfortable  Cardiovascular: s1s2  Respiratory: ctab  Abdomen: soft NT ND BOWEL sounds decreased, umbilical hernia could not be reduced.   Musculoskeletal: LLE edema> RLE  Data Reviewed: Basic Metabolic Panel:  Recent Labs Lab 12/09/12 1038 12/10/12 0431  NA 138 138  K 3.5 3.7  CL 99 102  CO2 28 27  GLUCOSE 203* 141*  BUN 26* 30*  CREATININE 1.46* 1.51*  CALCIUM 9.5 8.6   Liver Function Tests:  Recent Labs Lab 12/09/12 1038 12/10/12 0431  AST 25 28  ALT 28 23  ALKPHOS 45 37*  BILITOT 0.9 0.9  PROT 7.0 5.9*  ALBUMIN 3.2* 2.7*    Recent Labs Lab 12/09/12 1038  LIPASE 26   No results found for this basename: AMMONIA,  in the last 168 hours CBC:  Recent Labs Lab 12/09/12 1038 12/10/12 0431 12/11/12 0100 12/12/12 0550 12/13/12 1527  WBC 9.7 9.1 8.6 8.0 5.4  NEUTROABS 7.3  --   --   --   --  HGB 15.4* 13.4 13.5 13.7 12.7  HCT 45.3 40.4 40.6 42.0 39.7  MCV 77.8* 78.8 79.1 79.5 80.7  PLT 193 180 159 179 168   Cardiac Enzymes: No results found for this basename: CKTOTAL, CKMB, CKMBINDEX, TROPONINI,  in the last 168 hours BNP (last 3 results)  Recent Labs  12/13/12 0650  PROBNP 1325.0*   CBG:  Recent Labs Lab 12/12/12 1704 12/12/12 2206 12/13/12 0752 12/13/12 1128 12/13/12 1643  GLUCAP 139* 130* 138* 151* 144*    No results found for this or any previous visit (from the past 240 hour(s)).    Studies: No results found.  Scheduled Meds: . amiodarone  200 mg Oral Daily  . furosemide  40 mg Intravenous BID  . insulin aspart  0-5 Units Subcutaneous QHS  . insulin aspart  0-9 Units Subcutaneous TID WC  . metoprolol  2.5 mg Intravenous Q12H   Continuous Infusions: . 0.9 % NaCl with KCl 20 mEq / L 100 mL/hr (12/13/12 1748)  . heparin 1,050 Units/hr (12/13/12 1430)    Principal Problem:   Umbilical hernia, incarcerated - reducible Active Problems:   SBO (small bowel obstruction)   HTN (hypertension)   Dyslipidemia   Atrial fibrillation   Diabetes   CKD (chronic kidney disease), stage III    Time spent: 30 min    [name]  [facility] Pager [phone]. If 7PM-7AM, please contact night-coverage at [email or web address], password [facility] 12/13/2012, 6:14 PM  LOS: 4 days

## 2012-12-13 NOTE — Evaluation (Signed)
Physical Therapy Re-Evaluation Patient Details Name: [name] MRN: [id number] DOB: 1933-12-29 Today's Date: 12/13/2012 Time: [id number] PT Time Calculation (min): 34 min  PT Assessment / Plan / Recommendation History of Present Illness  Incarcerated and obstructed ventral hernia  s/p Ex laparoscopy with LOA( Dr. Lilyan Punt 9/22) Pt roredered for PT  Clinical Impression  Pt limited by pain today, but she was willing to try.  Hopeful she will improve as she recovers and be able to d/c to home    PT Assessment  Patient needs continued PT services    Follow Up Recommendations  Home health PT    Does the patient have the potential to tolerate intense rehabilitation      Barriers to Discharge        Equipment Recommendations  Rolling walker with 5" wheels    Recommendations for Other Services OT consult   Frequency Min 4X/week    Precautions / Restrictions Precautions Precautions: Fall Precaution Comments: NG tube, pain abdominal binder   Pertinent Vitals/Pain Severe pain with mobility      Mobility  Bed Mobility Bed Mobility: Rolling Right;Rolling Left;Supine to Sit;Sit to Supine Rolling Right: 1: +2 Total assist Rolling Right: Patient Percentage: 50% Rolling Left: 1: +2 Total assist Rolling Left: Patient Percentage: 50% Supine to Sit: 1: +2 Total assist Supine to Sit: Patient Percentage: 30% Sit to Supine: 1: +2 Total assist Sit to Supine: Patient Percentage: 30% Details for Bed Mobility Assistance: pt severely limited by pain Transfers Transfers: Sit to Stand;Stand to Sit Sit to Stand: 1: +2 Total assist Sit to Stand: Patient Percentage: 30% Stand to Sit: 1: +2 Total assist Stand to Sit: Patient Percentage: 30% Details for Transfer Assistance: limited by pain  and unable to stand completely erect Ambulation/Gait Ambulation/Gait Assistance: Not tested (comment) Assistive device: Rolling walker Stairs: No Wheelchair Mobility Wheelchair Mobility: No     Exercises General Exercises - Lower Extremity Ankle Circles/Pumps: AROM;Both;5 reps;Supine Quad Sets: AROM;Both;5 reps;Supine Gluteal Sets: AROM;Both;5 reps;Supine;Standing   PT Diagnosis: Difficulty walking;Acute pain;Generalized weakness  PT Problem List: Decreased strength;Decreased balance;Decreased activity tolerance;Decreased mobility;Decreased safety awareness;Decreased knowledge of use of DME;Obesity PT Treatment Interventions: DME instruction;Gait training;Stair training;Functional mobility training;Therapeutic activities;Therapeutic exercise;Balance training;Patient/family education     PT Goals(Current goals can be found in the care plan section) Acute Rehab PT Goals Patient Stated Goal: Resume previous lifestyle asap PT Goal Formulation: With patient Time For Goal Achievement: 12/30/12 Potential to Achieve Goals: Good  Visit Information  Last PT Received On: 12/13/12 Assistance Needed: +2 History of Present Illness: Incarcerated and obstructed ventral hernia  s/p Ex laparoscopy with LOA( Dr. Lilyan Punt 9/22) Pt roredered for PT       Prior [place] expects to be discharged to:: Private residence Living Arrangements: Spouse/significant other Available Help at Discharge: Family Type of Home: House Home Access: Stairs to enter Technical brewer of Steps: 2 Entrance Stairs-Rails: None Home Layout: One level Home Equipment: Environmental consultant - 2 wheels;Cane - quad Prior Function Level of Independence: Independent with assistive device(s) Communication Communication: No difficulties Dominant Hand: Right    Cognition  Cognition Arousal/Alertness: Awake/alert Behavior During Therapy: WFL for tasks assessed/performed Overall Cognitive Status: Within Functional Limits for tasks assessed    Extremity/Trunk Assessment Cervical / Trunk Assessment Cervical / Trunk Assessment: Other exceptions Cervical / Trunk Exceptions: pt now with NG tube.   Multiple laparoscopic sites with tenderness below umbicius.  Abdominal binder in place   Balance Balance Balance Assessed: Yes Static Sitting Balance Static  Sitting - Balance Support: Bilateral upper extremity supported;Feet supported Static Sitting - Level of Assistance: 5: Stand by assistance Static Sitting - Comment/# of Minutes: pt able to sit on edge of bed for several minutes.  Some dizziness in upright that seemed to dissipate as she sat there Static Standing Balance Static Standing - Balance Support: Bilateral upper extremity supported Static Standing - Level of Assistance: 1: +2 Total assist Static Standing - Comment/# of Minutes: needed assist to stand lmited by pain  End of Session PT - End of Session Activity Tolerance: Patient limited by pain Patient left: in bed;with family/visitor present Nurse Communication: Mobility status  GP    Helene Kelp K. [place], [place] 12/13/2012, 4:45 PM

## 2012-12-13 NOTE — Progress Notes (Signed)
PT Cancellation Note  Patient Details Name: ELVI LUDWIG MRN: [id number] DOB: 1933-06-22   Cancelled Treatment:    Reason Eval/Treat Not Completed:  (surgery) PLEASE REORDER ACTIVITY FOLLOWING RECENT SURGERY. THABK YOU.   Claretha Cooper 12/13/2012, 7:15 AM

## 2012-12-13 NOTE — Progress Notes (Signed)
ANTICOAGULATION CONSULT NOTE - Follow Up Consult  Pharmacy Consult for Heparin IV Indication: atrial fibrillation  Allergies  Allergen Reactions  . Lotensin [Benazepril Hcl] Anaphylaxis  . Zantac [Ranitidine Hcl] Anaphylaxis    Patient Measurements: Height: 5\' 6"  (167.6 cm) Weight: 237 lb 7 oz (107.7 kg) IBW/kg (Calculated) : 59.3 Heparin Dosing Weight: 84 kg  Vital Signs: Temp: 101 F (38.3 C) (09/22 0948) Temp src: Oral (09/22 0948) BP: 157/68 mmHg (09/22 0948) Pulse Rate: 95 (09/22 0948)  Labs:  Recent Labs  12/11/12 0100  12/11/12 1840 12/12/12 0550 12/13/12 0650  HGB 13.5  --   --  13.7  --   HCT 40.6  --   --  42.0  --   PLT 159  --   --  179  --   LABPROT 22.8*  --   --  15.2 15.5*  INR 2.09*  --   --  1.23 1.26  HEPARINUNFRC 0.51  < > 0.66 <0.10* 0.31  < > = values in this interval not displayed.  Estimated Creatinine Clearance: 38.8 ml/min (by C-G formula based on Cr of 1.51).   Medications: Infusions:  . 0.9 % NaCl with KCl 20 mEq / L 100 mL/hr at 12/12/12 1830  . heparin 1,050 Units/hr (12/12/12 2300)    Assessment: 27 yoF on chronic warfarin therapy PTA for atrial fibrillation presents with ventral hernia/SBO/incarcerated SB.  Warfarin on hold (last dose given 9/18) and heparin bridge started 9/19 for surgery this admission.  S/p lap ventral hernia repair on 12/12/12.  Heparin resumed 8 hours post-op.  Heparin level therapeutic this AM at 0.31 with infusion at 1050 units/hr.  This is the first heparin level since resuming at 2300 last night.  Will recheck another HL today to confirm.  INR 1.26 (s/p vitamin K 10mg  SQ on 9/19, and FFP on 9/20)  No CBC ordered this AM following procedure yesterday.  Will order a CBC with heparin level this afternoon.  Goal of Therapy:  Heparin level 0.3-0.7 units/ml Monitor platelets by anticoagulation protocol: Yes   Plan:   Continue heparin at current rate of 1050 units/hr (10.5 mL/hr)  Recheck heparin level  at 1500.  Order CBC at this time as well.  Daily INR, heparin level, and CBC.  Continue to monitor platelets and for bleeding.  F/U when to resume warfarin.  Hershal Coria, PharmD, BCPS Pager: [phone] 12/13/2012 10:36 AM

## 2012-12-14 LAB — CBC
HCT: 40.6 % (ref 36.0–46.0)
Hemoglobin: 13.1 g/dL (ref 12.0–15.0)
MCH: 26.2 pg (ref 26.0–34.0)
MCV: 81.2 fL (ref 78.0–100.0)
Platelets: 165 10*3/uL (ref 150–400)
RBC: 5 MIL/uL (ref 3.87–5.11)
WBC: 7.4 10*3/uL (ref 4.0–10.5)

## 2012-12-14 LAB — GLUCOSE, CAPILLARY
Glucose-Capillary: 147 mg/dL — ABNORMAL HIGH (ref 70–99)
Glucose-Capillary: 180 mg/dL — ABNORMAL HIGH (ref 70–99)

## 2012-12-14 LAB — HEPARIN LEVEL (UNFRACTIONATED): Heparin Unfractionated: 0.27 IU/mL — ABNORMAL LOW (ref 0.30–0.70)

## 2012-12-14 LAB — MRSA PCR SCREENING: MRSA by PCR: NEGATIVE

## 2012-12-14 LAB — PROTIME-INR
INR: 1.21 (ref 0.00–1.49)
Prothrombin Time: 15 seconds (ref 11.6–15.2)

## 2012-12-14 MED ORDER — DILTIAZEM HCL 25 MG/5ML IV SOLN
5.0000 mg | Freq: Once | INTRAVENOUS | Status: AC
  Administered 2012-12-14: 5 mg via INTRAVENOUS

## 2012-12-14 MED ORDER — POTASSIUM CHLORIDE IN NACL 20-0.9 MEQ/L-% IV SOLN
INTRAVENOUS | Status: AC
  Administered 2012-12-14: 75 mL/h via INTRAVENOUS
  Administered 2012-12-15: 01:00:00 via INTRAVENOUS
  Filled 2012-12-14 (×2): qty 1000

## 2012-12-14 MED ORDER — WARFARIN - PHARMACIST DOSING INPATIENT: Freq: Every day | Status: DC

## 2012-12-14 MED ORDER — WARFARIN SODIUM 7.5 MG PO TABS
7.5000 mg | ORAL_TABLET | Freq: Once | ORAL | Status: AC
  Administered 2012-12-14: 7.5 mg via ORAL
  Filled 2012-12-14: qty 1

## 2012-12-14 MED ORDER — DILTIAZEM HCL 25 MG/5ML IV SOLN
5.0000 mg | Freq: Once | INTRAVENOUS | Status: AC
  Administered 2012-12-14: 5 mg via INTRAVENOUS
  Filled 2012-12-14: qty 5

## 2012-12-14 MED ORDER — HEPARIN (PORCINE) IN NACL 100-0.45 UNIT/ML-% IJ SOLN
1150.0000 [IU]/h | INTRAMUSCULAR | Status: DC
  Administered 2012-12-14 – 2012-12-17 (×4): 1150 [IU]/h via INTRAVENOUS
  Filled 2012-12-14 (×7): qty 250

## 2012-12-14 MED ORDER — DEXTROSE 5 % IV SOLN
5.0000 mg/h | INTRAVENOUS | Status: DC
  Administered 2012-12-14: 10 mg/h via INTRAVENOUS
  Administered 2012-12-15 – 2012-12-16 (×4): 15 mg/h via INTRAVENOUS
  Administered 2012-12-16: 10 mg/h via INTRAVENOUS
  Filled 2012-12-14: qty 100

## 2012-12-14 MED ORDER — BISACODYL 10 MG RE SUPP: 10.0000 mg | Freq: Every day | RECTAL | Status: DC | PRN

## 2012-12-14 NOTE — Progress Notes (Signed)
ANTICOAGULATION CONSULT NOTE - Follow Up Consult  Pharmacy Consult for Heparin IV, Warfarin Indication: atrial fibrillation  Allergies  Allergen Reactions  . Lotensin [Benazepril Hcl] Anaphylaxis  . Zantac [Ranitidine Hcl] Anaphylaxis    Patient Measurements: Height: 5\' 6"  (167.6 cm) Weight: 237 lb 7 oz (107.7 kg) IBW/kg (Calculated) : 59.3 Heparin Dosing Weight: 84 kg  Vital Signs: Temp: 99.3 F (37.4 C) (09/23 1401) Temp src: Oral (09/23 1401) BP: 125/74 mmHg (09/23 1401) Pulse Rate: 134 (09/23 1401)  Labs:  Recent Labs  12/12/12 0550 12/13/12 0650 12/13/12 1527 12/14/12 0433 12/14/12 1250  HGB 13.7  --  12.7 13.1  --   HCT 42.0  --  39.7 40.6  --   PLT 179  --  168 165  --   LABPROT 15.2 15.5*  --  15.0  --   INR 1.23 1.26  --  1.21  --   HEPARINUNFRC <0.10* 0.31 0.44 <0.10* 0.27*    Estimated Creatinine Clearance: 38.8 ml/min (by C-G formula based on Cr of 1.51).   Medications: Infusions:  . 0.9 % NaCl with KCl 20 mEq / L 75 mL/hr (12/14/12 1206)  . heparin 1,050 Units/hr (12/13/12 1430)    Assessment: 15 yoF on chronic warfarin therapy PTA for atrial fibrillation presents with ventral hernia/SBO/incarcerated SB.  Warfarin on hold (last dose given 9/18) and heparin bridge started 9/19 for surgery this admission.  S/p lap ventral hernia repair on 12/12/12.  Heparin resumed 8 hours post-op.  Heparin level subtherapeutic this AM and was attributed to leaking peripheral IV line infusing heparin.  New line started and heparin resumed at previous therapeutic rate of 1050 units/hr.  Heparin level 8 hours after heparin resumption was low at 0.27.  INR 1.21 (s/p vitamin K 10mg  SQ on 9/19, and FFP on 9/20).  MD ordered to resume warfarin today.  Warfarin dose PTA reported as 5 mg daily.  Hgb and platelets WNL.  Patient's HR high and transferred to ICU for tele monitoring until system comes back up up med/surg floor. No bleeding/complications reported.  Goal of  Therapy:  Heparin level 0.3-0.7 units/ml Monitor platelets by anticoagulation protocol: Yes   Plan:   Warfarin 7.5 mg once today.  Increase heparin infusion to 1150 units/hr (11.5 mL/hr)  Recheck heparin level at 2330 tonight.  Daily INR, heparin level, and CBC.  Continue to monitor platelets and for bleeding.  Hershal Coria, PharmD, BCPS Pager: [phone] 12/14/2012 2:55 PM

## 2012-12-14 NOTE — Progress Notes (Addendum)
RRT RN in to push IV Cardizem for HR 140's. 2 D Echo unable to be done due to increased HR.  BP 133/78, HR 148. Cardizem 5 mg IVP given.  1:45 pm BP 109/72, HR 152.  1:48 pm BP 11/72, HR 148.  1:53 pm BP 125/53, HR 140. Dr Charlies Silvers paged for second dose of Cardizem. Order received.  2:01 pm  Cardizem 5 mg IVP given. BP 114/76, HR 120.   2:06 pm BP 105/68, HR 128.  2:11 pm 133/73, HR 126. Decision to transfer pt to ICU for tele monitoring, until [facility] system comes back up, made by A. Illene Regulus.

## 2012-12-14 NOTE — Progress Notes (Signed)
Physical Therapy Treatment Patient Details Name: [name] MRN: [id number] DOB: 03/11/1934 Today's Date: 12/14/2012 Time: [id number] PT Time Calculation (min): 15 min  PT Assessment / Plan / Recommendation  History of Present Illness Pt continues with increased HR- seen for return to bed from chair   PT Comments   Pt able to assist more with standing and stepping back to bed  Follow Up Recommendations  SNF     Does the patient have the potential to tolerate intense rehabilitation     Barriers to Discharge        Equipment Recommendations  Rolling walker with 5" wheels    Recommendations for Other Services OT consult  Frequency Min 4X/week   Progress towards PT Goals Progress towards PT goals: Progressing toward goals  Plan Discharge plan needs to be updated    Precautions / Restrictions Precautions Precautions: Fall Precaution Comments: NG tube, pain abdominal binder   Pertinent Vitals/Pain HR to 150's closely monitored by RN, pain in abdomen    Mobility  Bed Mobility Bed Mobility: Sit to Supine Supine to Sit: 1: +2 Total assist;HOB elevated Supine to Sit: Patient Percentage: 30% Sit to Supine: 1: +2 Total assist Sit to Supine: Patient Percentage: 30% Details for Bed Mobility Assistance: Pt able to move better this session Transfers Transfers: Sit to Stand;Stand to Sit;Stand Pivot Transfers Sit to Stand: 1: +2 Total assist Sit to Stand: Patient Percentage: 40% Stand to Sit: 1: +2 Total assist Stand to Sit: Patient Percentage: 40% Stand Pivot Transfers: 1: +2 Total assist Stand Pivot Transfers: Patient Percentage: 40% Details for Transfer Assistance: pt better able to stand and weight shift to step back to bed Ambulation/Gait Ambulation/Gait Assistance: 1: +2 Total assist Ambulation/Gait: Patient Percentage: 40% Ambulation Distance (Feet): 2 Feet Assistive device: Rolling walker Ambulation/Gait Assistance Details: assit at pelvis to stand and weight  shift Gait Pattern: Step-to pattern;Decreased step length - right;Decreased step length - left Gait velocity: decreased General Gait Details: pt limited by pain especially in transitional movements Stairs: No Wheelchair Mobility Wheelchair Mobility: No    Exercises General Exercises - Lower Extremity Ankle Circles/Pumps: AROM;Both;5 reps;Supine Quad Sets: AROM;Both;5 reps;Supine Gluteal Sets: AROM;Both;5 reps;Supine;Standing Short Arc Quad: AROM;Both;5 reps;Supine Other Exercises Other Exercises: deep breathing   PT Diagnosis:    PT Problem List:   PT Treatment Interventions:     PT Goals (current goals can now be found in the care plan section)    Visit Information  Last PT Received On: 12/14/12 Assistance Needed: +2 History of Present Illness: Pt continues with increased HR- seen for return to bed from chair    Subjective Data      Cognition  Cognition Arousal/Alertness: Awake/alert Behavior During Therapy: WFL for tasks assessed/performed Overall Cognitive Status: Within Functional Limits for tasks assessed    Balance  Balance Balance Assessed: Yes Static Sitting Balance Static Sitting - Balance Support: Bilateral upper extremity supported;Feet supported Static Sitting - Level of Assistance: 5: Stand by assistance Static Standing Balance Static Standing - Balance Support: Bilateral upper extremity supported Static Standing - Level of Assistance: 1: +2 Total assist  End of Session PT - End of Session Activity Tolerance: Patient limited by pain Patient left: with family/visitor present;in chair;with nursing/sitter in room Nurse Communication: Mobility status   GP    Donato Heinz. [place], [place] 12/14/2012, 4:21 PM

## 2012-12-14 NOTE — Progress Notes (Signed)
Noted peripheral iv leaking infusing heparin at 0500.  Pharmacy notified, new line started

## 2012-12-14 NOTE — Progress Notes (Signed)
[facility] PROGRESS NOTE  [name] [id number] DOB: Sep 06, 1933 DOA: 12/09/2012 PCP: Milagros Evener, MD  Brief narrative: 79 year-old female with past medical history significant for diabetes, hypertension, chronic kidney disease, dyslipidemia, atrial fibrillation who presented to [facility] ED 12/09/2012 with worsening abdominal pain for few days prior to this admission associated with nausea and poor oral intake. Patient was subsequently found to have small bowel obstruction and incarcerated/obstructed ventral hernia. Patient underwent exploratory laparoscopy with LOA by Dr. Lilyan Punt on 12/13/2012.  Assessment/Plan:   Principal problem: Incarcerated and obstructed ventral hernia  - Status post exploratory laparoscopy with LOA done 12/13/12 - Patient still has NG tube. Continue with NG tube suction, n.p.o. - continue normal saline with potassium at 100 cc/hr - continue to apply abdominal binder  Active Problems:  Atrial fibrillation  - on amiodarone and metoprolol for rate control  - also on coumadin at home which was held for surgery  - Heparin and Coumadin will be restarted today Chronic diastolic CHF - BNP [id number] on 12/13/2012 - Followup 2-D echo - Patient was on Lasix 40 mg IV twice daily. She has +1 lower extremity pitting edema but is receiving IV fluids. Due to ongoing NG tube suctions and recent surgery I would continue IV fluids for 1 more day but stop using Lasix as it makes no sense to have fluids and Lasix at the same time. Then, reassess for edema in am and consider tapering fluids and restarting lasix if indicated - in past 24 hours there was 1.6 liter negative fluid balance HTN (hypertension)  - controlled. Continue metoprolol and amiodarone  Dyslipidemia  - continue statin therapy on discharge.  CKD, stage 3  - creatinine 1.7 in 2012 and on this admission 1.46  - continue to monitor renal function  - obtain CBC and BMP in am Diabetes  - A1c is 8.1 indicating poor  glycemic control - Appreciate diabetic coordinator input - Patient is n.p.o. so continue sliding scale insulin  Code Status: full code  Family Communication: Patient's husband at the bedside Disposition Plan: Followup of physical therapy  Consultants:  Surgery  Cardiology Procedures:   Exploratory laparotomy 12/13/2012 Antibiotics:  none   If 7PM-7AM, please contact night-coverage [email or web address] Password [email or web address] 12/14/2012, 11:01 AM   LOS: 5 days     HPI/Subjective: No acute overnight events.  Objective: Filed Vitals:   12/13/12 2128 12/14/12 0200 12/14/12 0553 12/14/12 0942  BP: 153/76 115/88 126/83 144/76  Pulse: 92 80 87 134  Temp: 98.9 F (37.2 C) 99 F (37.2 C) 99.2 F (37.3 C) 98.5 F (36.9 C)  TempSrc: Oral Oral Oral Oral  Resp: 18 18 18 20   Height:      Weight:      SpO2: 95% 94% 96% 94%    Intake/Output Summary (Last 24 hours) at 12/14/12 1101 Last data filed at 12/14/12 0700  Gross per 24 hour  Intake   2400 ml  Output   2350 ml  Net     50 ml    Exam:   General:  Pt is alert, follows commands appropriately, not in acute distress  Cardiovascular: Regular rate and rhythm, S1/S2 appreciated  Respiratory: Clear to auscultation bilaterally, no wheezing, no crackles, no rhonchi  Abdomen: Donald binder, abdomen distended with diminished bowel sounds  Extremities: LE (+1) pitting edema, pulses DP and PT palpable bilaterally  Neuro: Grossly nonfocal  Data Reviewed: Basic Metabolic Panel:  Recent Labs Lab 12/09/12 1038 12/10/12 0431  NA 138 138  K 3.5  3.7  CL 99 102  CO2 28 27  GLUCOSE 203* 141*  BUN 26* 30*  CREATININE 1.46* 1.51*  CALCIUM 9.5 8.6   Liver Function Tests:  Recent Labs Lab 12/09/12 1038 12/10/12 0431  AST 25 28  ALT 28 23  ALKPHOS 45 37*  BILITOT 0.9 0.9  PROT 7.0 5.9*  ALBUMIN 3.2* 2.7*    Recent Labs Lab 12/09/12 1038  LIPASE 26   No results found for this basename: AMMONIA,  in the last 168  hours CBC:  Recent Labs Lab 12/09/12 1038 12/10/12 0431 12/11/12 0100 12/12/12 0550 12/13/12 1527 12/14/12 0433  WBC 9.7 9.1 8.6 8.0 5.4 7.4  NEUTROABS 7.3  --   --   --   --   --   HGB 15.4* 13.4 13.5 13.7 12.7 13.1  HCT 45.3 40.4 40.6 42.0 39.7 40.6  MCV 77.8* 78.8 79.1 79.5 80.7 81.2  PLT 193 180 159 179 168 165   Cardiac Enzymes: No results found for this basename: CKTOTAL, CKMB, CKMBINDEX, TROPONINI,  in the last 168 hours BNP: No components found with this basename: POCBNP,  CBG:  Recent Labs Lab 12/13/12 0752 12/13/12 1128 12/13/12 1643 12/13/12 2122 12/14/12 0809  GLUCAP 138* 151* 144* 160* 147*    CULTURE, BLOOD (ROUTINE X 2)     Status: None   Collection Time    12/13/12 12:12 PM      Result Value Range Status   Specimen Description BLOOD LEFT HAND   Final   Value:        BLOOD CULTURE RECEIVED NO GROWTH TO DATE     Performed at [facility]   Report Status PENDING   Incomplete  CULTURE, BLOOD (ROUTINE X 2)     Status: None   Collection Time    12/13/12 12:20 PM      Result Value Range Status   Specimen Description BLOOD LEFT HAND   Final   Value:        BLOOD CULTURE RECEIVED NO GROWTH TO DATE     Performed at [facility]   Report Status PENDING   Incomplete     Studies: No results found.  Scheduled Meds: . amiodarone  200 mg Oral Daily  . furosemide  40 mg Intravenous BID  . insulin aspart  0-5 Units Subcutaneous QHS  . insulin aspart  0-9 Units Subcutaneous TID WC  . metoprolol  2.5 mg Intravenous Q12H   Continuous Infusions: . 0.9 % NaCl with KCl 20 mEq / L 100 mL/hr (12/13/12 1748)  . heparin 1,050 Units/hr (12/13/12 1430)

## 2012-12-14 NOTE — Progress Notes (Signed)
Physical Therapy Treatment Patient Details Name: [name] MRN: [id number] DOB: Aug 16, 1933 Today's Date: 12/14/2012 Time: 1035-1100 PT Time Calculation (min): 25 min  PT Assessment / Plan / Recommendation  History of Present Illness Pt with increased HR being continuously monitored by RN in room and discussion was in progress about deferring PT until HR decreased.  Pt's RN entered room and stated it was imperative that pt get out of bed because of sluggish retrun of bowels sounds.  PTs and RNs assisted pt to edge of bed and to chair.   PT Comments   Pt with increased HR today, but was able to participate with PT to take small steps to get to chair  Follow Up Recommendations  SNF (due to slow progress)     Does the patient have the potential to tolerate intense rehabilitation     Barriers to Discharge        Equipment Recommendations  Rolling walker with 5" wheels    Recommendations for Other Services OT consult  Frequency Min 4X/week   Progress towards PT Goals    Plan Discharge plan needs to be updated    Precautions / Restrictions Precautions Precautions: Fall Precaution Comments: NG tube, pain abdominal binder   Pertinent Vitals/Pain HR to 160 with activity    Mobility  Bed Mobility Bed Mobility: Supine to Sit;Sit to Supine Supine to Sit: 1: +2 Total assist;HOB elevated Supine to Sit: Patient Percentage: 30% Details for Bed Mobility Assistance: pt limited be pain, needs assist to move legs and to lift upper body into sitting Transfers Transfers: Sit to Stand;Stand to Sit;Stand Pivot Transfers Sit to Stand: 1: +2 Total assist Sit to Stand: Patient Percentage: 30% Stand to Sit: 1: +2 Total assist Stand to Sit: Patient Percentage: 30% Stand Pivot Transfers: 1: +2 Total assist Stand Pivot Transfers: Patient Percentage: 30% Details for Transfer Assistance: pt better able to stand and weight shift to step around to chair Ambulation/Gait Ambulation/Gait Assistance:  1: +2 Total assist Ambulation/Gait: Patient Percentage: 30% Ambulation Distance (Feet): 2 Feet Assistive device: Rolling walker Ambulation/Gait Assistance Details: assit at pelvis to stand and weight shift Gait Pattern: Step-to pattern;Decreased step length - right;Decreased step length - left Gait velocity: decreased General Gait Details: pt limited by pain especially in transitional movements Stairs: No Wheelchair Mobility Wheelchair Mobility: No    Exercises General Exercises - Lower Extremity Ankle Circles/Pumps: AROM;Both;5 reps;Supine Quad Sets: AROM;Both;5 reps;Supine Gluteal Sets: AROM;Both;5 reps;Supine;Standing Short Arc Quad: AROM;Both;5 reps;Supine Other Exercises Other Exercises: deep breathing   PT Diagnosis:    PT Problem List:   PT Treatment Interventions:     PT Goals (current goals can now be found in the care plan section)    Visit Information  Last PT Received On: 12/14/12 Assistance Needed: +2 History of Present Illness: Pt with increased HR being continuously monitored by RN in room and discussion was in progress about deferring PT until HR decreased.  Pt's RN entered room and stated it was imperative that pt get out of bed because of sluggish retrun of bowels sounds.  PTs and RNs assisted pt to edge of bed and to chair.    Subjective Data      Cognition  Cognition Arousal/Alertness: Awake/alert Behavior During Therapy: WFL for tasks assessed/performed Overall Cognitive Status: Within Functional Limits for tasks assessed    Balance  Balance Balance Assessed: Yes Static Sitting Balance Static Sitting - Balance Support: Bilateral upper extremity supported;Feet supported Static Sitting - Level of Assistance: 5: Stand  by assistance Static Standing Balance Static Standing - Balance Support: Bilateral upper extremity supported Static Standing - Level of Assistance: 1: +2 Total assist  End of Session PT - End of Session Activity Tolerance: Patient  limited by pain Patient left: with family/visitor present;in chair;with nursing/sitter in room Nurse Communication: Mobility status   GP    Donato Heinz. [place], [place] 12/14/2012, 4:15 PM

## 2012-12-14 NOTE — Progress Notes (Signed)
2 Days Post-Op  Subjective: Pt feeling okay "if I don't move".  No N/V.  No flatus or BM yet.  Up to side of bed, but not ambulated yet.  Urinating normally.  NPO.   Objective: Vital signs in last 24 hours: Temp:  [98.5 F (36.9 C)-101 F (38.3 C)] 99.2 F (37.3 C) (09/23 0553) Pulse Rate:  [80-95] 87 (09/23 0553) Resp:  [18-24] 18 (09/23 0553) BP: (115-157)/(48-88) 126/83 mmHg (09/23 0553) SpO2:  [93 %-96 %] 96 % (09/23 0553) Last BM Date: 12/09/12  Intake/Output from previous day: 09/22 0701 - 09/23 0700 In: 2560 [I.V.:2300; NG/GT:160] Out: 2450 [Urine:2150; Emesis/NG output:300] Intake/Output this shift:    PE: Gen:  Alert, NAD, pleasant Abd: Soft, +distension, moderate tenderness to palpation, diminished BS, no HSM, incisions C/D/I  Lab Results:   Recent Labs  12/13/12 1527 12/14/12 0433  WBC 5.4 7.4  HGB 12.7 13.1  HCT 39.7 40.6  PLT 168 165   BMET No results found for this basename: NA, K, CL, CO2, GLUCOSE, BUN, CREATININE, CALCIUM,  in the last 72 hours PT/INR  Recent Labs  12/13/12 0650 12/14/12 0433  LABPROT 15.5* 15.0  INR 1.26 1.21   CMP     Component Value Date/Time   NA 138 12/10/2012 0431   K 3.7 12/10/2012 0431   CL 102 12/10/2012 0431   CO2 27 12/10/2012 0431   GLUCOSE 141* 12/10/2012 0431   BUN 30* 12/10/2012 0431   CREATININE 1.51* 12/10/2012 0431   CALCIUM 8.6 12/10/2012 0431   PROT 5.9* 12/10/2012 0431   ALBUMIN 2.7* 12/10/2012 0431   AST 28 12/10/2012 0431   ALT 23 12/10/2012 0431   ALKPHOS 37* 12/10/2012 0431   BILITOT 0.9 12/10/2012 0431   GFRNONAA 32* 12/10/2012 0431   GFRAA 37* 12/10/2012 0431   Lipase     Component Value Date/Time   LIPASE 26 12/09/2012 1038       Studies/Results: No results found.  Anti-infectives: Anti-infectives   Start     Dose/Rate Route Frequency Ordered Stop   12/12/12 1800  ceFAZolin (ANCEF) IVPB 2 g/50 mL premix     2 g 100 mL/hr over 30 Minutes Intravenous  Once 12/12/12 1638 12/12/12 1900   12/12/12 1126  ceFAZolin (ANCEF) IVPB 2 g/50 mL premix     2 g 100 mL/hr over 30 Minutes Intravenous 30 min pre-op 12/12/12 1122 12/12/12 1140       Assessment/Plan Incarcerated and obstructed ventral hernia POD #2 s/p Ex laparoscopy with LOA( Dr. Lilyan Punt 9/22)  -IVF, PRN IV narcotics, start PO meds when bowel function returns  -continue with NGT, NPO until bowel function returns  -demonstrate and reinforce IS Use @500  only -mobilize, PT on board -intake and output, appears bilious with 351mL -Apply abdominal binder  -Will ask about restarting coumadin, continue heparin     LOS: 5 days    DORT, Leni Pankonin 12/14/2012, 8:29 AM Pager: [phone]

## 2012-12-14 NOTE — Progress Notes (Signed)
Patient transferred to ICU from [place], alert and oriented, need heart monitoring, due to system being repaired, patient in AFib with RVR rhythm in the 150's and irregular, patient without any sign of distress, no complaints of CP, SOB, and or dizziness, BP=>125/74, P=>143, Temp=> 99.3, SATs=> 98% on 2L/[place], NG tube clamped at this time of transfer, draining greenish brownish colored drainage, foley cath draining clear yellow urine, IV x 2 right wrist and hand, Heparin @ 10.5 qtts/hr, skin dry and intact, family at the bedside, report called to Primary Nurse in ICU, patient in stable condition at this time.

## 2012-12-14 NOTE — Progress Notes (Signed)
ANTICOAGULATION CONSULT NOTE - Follow Up Consult  Pharmacy Consult for Heparin Indication: atrial fibrillation  Allergies  Allergen Reactions  . Lotensin [Benazepril Hcl] Anaphylaxis  . Zantac [Ranitidine Hcl] Anaphylaxis    Patient Measurements: Height: 5\' 6"  (167.6 cm) Weight: 237 lb 7 oz (107.7 kg) IBW/kg (Calculated) : 59.3 Heparin Dosing Weight:   Vital Signs: Temp: 99 F (37.2 C) (09/23 0200) Temp src: Oral (09/23 0200) BP: 115/88 mmHg (09/23 0200) Pulse Rate: 80 (09/23 0200)  Labs:  Recent Labs  12/12/12 0550 12/13/12 0650 12/13/12 1527 12/14/12 0433  HGB 13.7  --  12.7 13.1  HCT 42.0  --  39.7 40.6  PLT 179  --  168 165  LABPROT 15.2 15.5*  --  15.0  INR 1.23 1.26  --  1.21  HEPARINUNFRC <0.10* 0.31 0.44 <0.10*    Estimated Creatinine Clearance: 38.8 ml/min (by C-G formula based on Cr of 1.51).   Medications:  Infusions:  . 0.9 % NaCl with KCl 20 mEq / L 100 mL/hr (12/13/12 1748)  . heparin 1,050 Units/hr (12/13/12 1430)    Assessment: Patient with low heparin level this am.  Per RN patient's IV heparin line was faulty, he changed the line now.   Feel low level due to IV line issues  Goal of Therapy:  Heparin level 0.3-0.7 units/ml Monitor platelets by anticoagulation protocol: Yes   Plan:  Continue heparin at same rate, recheck level at 1300.  Tyler Deis, Shea Stakes Crowford 12/14/2012,5:13 AM

## 2012-12-15 LAB — GLUCOSE, CAPILLARY
Glucose-Capillary: 168 mg/dL — ABNORMAL HIGH (ref 70–99)
Glucose-Capillary: 158 mg/dL — ABNORMAL HIGH (ref 70–99)
Glucose-Capillary: 149 mg/dL — ABNORMAL HIGH (ref 70–99)

## 2012-12-15 LAB — CBC
HCT: 40.7 % (ref 36.0–46.0)
Hemoglobin: 12.9 g/dL (ref 12.0–15.0)
MCH: 25.9 pg — ABNORMAL LOW (ref 26.0–34.0)
MCHC: 31.7 g/dL (ref 30.0–36.0)
MCV: 81.6 fL (ref 78.0–100.0)
RDW: 14.8 % (ref 11.5–15.5)
WBC: 8.6 10*3/uL (ref 4.0–10.5)

## 2012-12-15 LAB — BASIC METABOLIC PANEL
BUN: 25 mg/dL — ABNORMAL HIGH (ref 6–23)
Chloride: 116 mEq/L — ABNORMAL HIGH (ref 96–112)
Creatinine, Ser: 1.61 mg/dL — ABNORMAL HIGH (ref 0.50–1.10)
GFR calc Af Amer: 34 mL/min — ABNORMAL LOW (ref >90)
GFR calc non Af Amer: 30 mL/min — ABNORMAL LOW (ref >90)

## 2012-12-15 LAB — HEPARIN LEVEL (UNFRACTIONATED): Heparin Unfractionated: 0.37 IU/mL (ref 0.30–0.70)

## 2012-12-15 MED ORDER — WARFARIN SODIUM 7.5 MG PO TABS
7.5000 mg | ORAL_TABLET | Freq: Once | ORAL | Status: AC
  Administered 2012-12-15: 7.5 mg via ORAL
  Filled 2012-12-15: qty 1

## 2012-12-15 MED ORDER — POTASSIUM CHLORIDE IN NACL 20-0.9 MEQ/L-% IV SOLN
INTRAVENOUS | Status: DC
  Administered 2012-12-15 – 2012-12-16 (×2): via INTRAVENOUS
  Filled 2012-12-15 (×3): qty 1000

## 2012-12-15 MED ORDER — INSULIN ASPART 100 UNIT/ML ~~LOC~~ SOLN
0.0000 [IU] | SUBCUTANEOUS | Status: DC
Start: 2012-12-15 — End: 2012-12-19
  Administered 2012-12-15: 2 [IU] via SUBCUTANEOUS
  Administered 2012-12-15: 1 [IU] via SUBCUTANEOUS
  Administered 2012-12-15 – 2012-12-16 (×2): 2 [IU] via SUBCUTANEOUS
  Administered 2012-12-16: 3 [IU] via SUBCUTANEOUS
  Administered 2012-12-16: 2 [IU] via SUBCUTANEOUS
  Administered 2012-12-16: 1 [IU] via SUBCUTANEOUS
  Administered 2012-12-16: 2 [IU] via SUBCUTANEOUS
  Administered 2012-12-16: 1 [IU] via SUBCUTANEOUS
  Administered 2012-12-17 (×4): 2 [IU] via SUBCUTANEOUS
  Administered 2012-12-17 (×2): 1 [IU] via SUBCUTANEOUS
  Administered 2012-12-18 (×3): 2 [IU] via SUBCUTANEOUS
  Administered 2012-12-18 – 2012-12-19 (×3): 1 [IU] via SUBCUTANEOUS
  Administered 2012-12-19: 2 [IU] via SUBCUTANEOUS
  Administered 2012-12-19: 1 [IU] via SUBCUTANEOUS

## 2012-12-15 MED ORDER — METOPROLOL TARTRATE 1 MG/ML IV SOLN
5.0000 mg | Freq: Two times a day (BID) | INTRAVENOUS | Status: DC
  Administered 2012-12-15 – 2012-12-18 (×8): 5 mg via INTRAVENOUS
  Filled 2012-12-15 (×9): qty 5

## 2012-12-15 NOTE — Progress Notes (Signed)
ANTICOAGULATION CONSULT NOTE - Follow Up Consult  Pharmacy Consult for Heparin IV, Warfarin Indication: atrial fibrillation  Patient Measurements: Height: 5\' 6"  (167.6 cm) Weight: 237 lb 7 oz (107.7 kg) IBW/kg (Calculated) : 59.3 Heparin Dosing Weight: 84 kg  Labs:  Recent Labs  12/13/12 0650  12/13/12 1527 12/14/12 0433 12/14/12 1250 12/15/12 0005 12/15/12 0330 12/15/12 0830  HGB  --   < > 12.7 13.1  --   --  12.9  --   HCT  --   --  39.7 40.6  --   --  40.7  --   PLT  --   --  168 165  --   --  185  --   LABPROT 15.5*  --   --  15.0  --   --  15.0  --   INR 1.26  --   --  1.21  --   --  1.21  --   HEPARINUNFRC 0.31  --  0.44 <0.10* 0.27* 0.37  --  0.44  CREATININE  --   --   --   --   --   --  1.61*  --   < > = values in this interval not displayed.   Assessment: 60 yoF on chronic warfarin therapy PTA for atrial fibrillation presented 9/18 with ventral hernia/SBO/incarcerated SB.  Warfarin was placed on hold (last dose given 9/18) and heparin bridge started 9/19 for surgery this admission. S/p lap ventral hernia repair on 12/12/12.  Heparin resumed 8 hours post-op. Warfarin resumed 9/23. Of note, patient received Vitamin K 10mg  SQ on 9/19, and FFP on 9/20. Warfarin dose PTA reported as 5 mg daily.   IV heparin running at 1150 units/hr and heparin level therapeutic x 2.  INR 1.21. Patient NPO.  CBC wnl, no bleeding.  Goal of Therapy:  Heparin level 0.3-0.7 units/ml Monitor platelets by anticoagulation protocol: Yes   Plan:   Repeat Warfarin 7.5 mg tonight x 1   Continue IV heparin at 1150 units/hr (11.5 mL/hr)  Daily INR, heparin level, and CBC.  Continue to monitor platelets and for bleeding.  Vanessa Camino Tassajara, PharmD, BCPS Pager: [phone] 12:18 PM Pharmacy #: [id number]

## 2012-12-15 NOTE — Progress Notes (Signed)
[facility] PROGRESS NOTE  [name] [id number] DOB: 02-03-1934 DOA: 12/09/2012 PCP: Milagros Evener, MD  Brief narrative: [name] is an 79 y.o. female with a PMH of diabetes, hypertension, chronic kidney disease, dyslipidemia, and atrial fibrillation who was admitted on 12/09/2012 with a small bowel obstruction and incarcerated ventral hernia who underwent exploratory laparoscopy with lysis of adhesions and repair of recurrent ventral hernia by Dr. Lilyan Punt on 12/13/2012. Her postoperative course has been complicated by atrial fibrillation with rapid ventricular response necessitating transfer to the step down unit on 12/14/2012.  Assessment/Plan: Principal Problem:   SBO (small bowel obstruction) secondary to incarcerated umbilical hernia -Status post exploratory laparoscopy with lysis of adhesions and repair of recurrent ventral hernia 12/13/2012. -Maintain NG tube to low intermittent suction until bowel sounds return and passing flatus. Active Problems:   Chronic diastolic CHF -BNP [id number] on 12/13/2012. -2-D echocardiogram ordered, not done yet.   HTN (hypertension) -Controlled on metoprolol, amiodarone and Cardizem.   Dyslipidemia -Resume statin when diet advanced.   Atrial fibrillation -Continue amiodarone, metoprolol and Cardizem drip. -Heparin/Coumadin resumed 12/14/2012. -Seen by cardiology 12/13/2012.   Diabetes -Hemoglobin 8.1%. Seen by diabetes coordinator. -Currently on insulin sensitive for SSI. Change SSI to every 4 hours while n.p.o. -CBG is 128-180.   CKD (chronic kidney disease), stage III -Creatinine appears to be better than usual baseline. Creatinine was 1.7 in 2012.    Code Status: Full. Family Communication: Husband updated at the bedside. Disposition Plan: Home versus SNF when stable.   Medical Consultants:  Dr. Madilyn Hook, Surgery  Dr. Daneen Schick III, Cardiology  Other Consultants:  Physical therapy  Diabetes  coordinator  Anti-infectives:  None.  HPI/Subjective: [name] continues to have some abdominal soreness. She had some significant pain when mobilizing yesterday. Has not had any flatus yet. Had some thick sputum yesterday after doing deep breathing exercises.  Objective: Filed Vitals:   12/15/12 0300 12/15/12 0400 12/15/12 0500 12/15/12 0600  BP: 146/67 128/70 137/56 130/55  Pulse: 96 95 138 119  Temp:      TempSrc:      Resp: 26 29 20 21   Height:      Weight:      SpO2: 94% 95% 94% 94%    Intake/Output Summary (Last 24 hours) at 12/15/12 0752 Last data filed at 12/15/12 0600  Gross per 24 hour  Intake   1671 ml  Output   1800 ml  Net   -129 ml    Exam: Gen:  NAD Cardiovascular:  Tachycardia, irregular. Respiratory:  Lungs CTAB Gastrointestinal:  Abdomen soft, NT/ND, + BS Extremities:  No C/E/C  Data Reviewed: Basic Metabolic Panel:  Recent Labs Lab 12/09/12 1038 12/10/12 0431 12/15/12 0330  NA 138 138 151*  K 3.5 3.7 3.5  CL 99 102 116*  CO2 28 27 23   GLUCOSE 203* 141* 151*  BUN 26* 30* 25*  CREATININE 1.46* 1.51* 1.61*  CALCIUM 9.5 8.6 9.7   GFR Estimated Creatinine Clearance: 36.4 ml/min (by C-G formula based on Cr of 1.61). Liver Function Tests:  Recent Labs Lab 12/09/12 1038 12/10/12 0431  AST 25 28  ALT 28 23  ALKPHOS 45 37*  BILITOT 0.9 0.9  PROT 7.0 5.9*  ALBUMIN 3.2* 2.7*    Recent Labs Lab 12/09/12 1038  LIPASE 26   Coagulation profile  Recent Labs Lab 12/11/12 0100 12/12/12 0550 12/13/12 0650 12/14/12 0433 12/15/12 0330  INR 2.09* 1.23 1.26 1.21 1.21    CBC:  Recent Labs  Lab 12/09/12 1038  12/11/12 0100 12/12/12 0550 12/13/12 1527 12/14/12 0433 12/15/12 0330  WBC 9.7  < > 8.6 8.0 5.4 7.4 8.6  NEUTROABS 7.3  --   --   --   --   --   --   HGB 15.4*  < > 13.5 13.7 12.7 13.1 12.9  HCT 45.3  < > 40.6 42.0 39.7 40.6 40.7  MCV 77.8*  < > 79.1 79.5 80.7 81.2 81.6  PLT 193  < > 159 179 168 165 185  < > =  values in this interval not displayed. BNP (last 3 results)  Recent Labs  12/13/12 0650  PROBNP 1325.0*   CBG:  Recent Labs Lab 12/13/12 2122 12/14/12 0809 12/14/12 1201 12/14/12 1607 12/14/12 2152  GLUCAP 160* 147* 128* 180* 131*   Microbiology Recent Results (from the past 240 hour(s))  CULTURE, BLOOD (ROUTINE X 2)     Status: None   Collection Time    12/13/12 12:12 PM      Result Value Range Status   Specimen Description BLOOD LEFT HAND   Final   Special Requests BOTTLES DRAWN AEROBIC AND ANAEROBIC [facility]   Final   Culture  Setup Time     Final   Value: 12/13/2012 15:44     Performed at [facility]   Culture     Final   Value:        BLOOD CULTURE RECEIVED NO GROWTH TO DATE CULTURE WILL BE HELD FOR 5 DAYS BEFORE ISSUING A FINAL NEGATIVE REPORT     Performed at [facility]   Report Status PENDING   Incomplete  CULTURE, BLOOD (ROUTINE X 2)     Status: None   Collection Time    12/13/12 12:20 PM      Result Value Range Status   Specimen Description BLOOD LEFT HAND   Final   Special Requests BOTTLES DRAWN AEROBIC AND ANAEROBIC 4CC   Final   Culture  Setup Time     Final   Value: 12/13/2012 15:44     Performed at [facility]   Culture     Final   Value:        BLOOD CULTURE RECEIVED NO GROWTH TO DATE CULTURE WILL BE HELD FOR 5 DAYS BEFORE ISSUING A FINAL NEGATIVE REPORT     Performed at [facility]   Report Status PENDING   Incomplete  MRSA PCR SCREENING     Status: None   Collection Time    12/14/12  4:04 PM      Result Value Range Status   MRSA by PCR NEGATIVE  NEGATIVE Final   Comment:            The GeneXpert MRSA Assay (FDA     approved for NASAL specimens     only), is one component of a     comprehensive MRSA colonization     surveillance program. It is not     intended to diagnose MRSA     infection nor to guide or     monitor treatment for     MRSA infections.     Procedures and Diagnostic Studies: Ct Abdomen  Pelvis Wo Contrast  12/09/2012   *RADIOLOGY REPORT*  Clinical Data: Lower pelvic pain  CT ABDOMEN AND PELVIS WITHOUT CONTRAST  Technique:  Multidetector CT imaging of the abdomen and pelvis was performed following the standard protocol without intravenous contrast.  Comparison: None.  Findings: Visualized lung bases appear  normal.  Hepatic cyst is noted inferiorly in right hepatic lobe.  The spleen and pancreas appear normal.  No gallstones are noted.  Adrenal glands and kidneys appear normal.  No renal or ureteral calculi are noted. Urinary bladder appears normal.  Calcified fibroids are seen involving the uterus.  Phleboliths are noted in the pelvis. Sigmoid diverticulosis is noted without inflammation.  There is noted a ventral hernia in the right lower quadrant of the abdomen which contains a loop of incarcerated terminal ileum and results in partial obstruction of the more proximal small bowel.  Wall thickening and inflammatory changes are seen involving the more distal terminal ileum extending to the ileocecal valve.  No abnormal fluid collection is noted.  Contrast is noted within the colon as well as stool.  IMPRESSION: Ventral hernia is noted in the right lower quadrant of the anterior abdominal wall which contains a loop of incarcerated terminal ileum, resulting in partial small bowel obstruction and dilatation of the more proximal small bowel.   Original Report Authenticated By: Marijo Conception.,  M.D.   Dg Abd 2 Views  12/10/2012   CLINICAL DATA:  Evaluate for small bowel obstruction. Periumbilical hernia with prior surgery. Now with protruding hernia right abdominal region and occasional constipation.  EXAM: ABDOMEN - 2 VIEW  COMPARISON:  12/09/2012 CT.  FINDINGS: Small bowel obstructive pattern with contrast seen in portions of the colon. Gas distended small bowel loops measuring up to 5 cm with thickened folds.  No free intraperitoneal air detected.  IMPRESSION: Small bowel obstructive pattern with  contrast seen in portions of the colon. Gas distended small bowel loops measuring up to 5 cm with thickened folds. No free intraperitoneal air detected.   Electronically Signed   By: Chauncey Cruel   On: 12/10/2012 10:36   Dg Abd Acute W/chest  12/09/2012   CLINICAL DATA:  Abdominal pain, hypertension.  EXAM: ACUTE ABDOMEN SERIES (ABDOMEN 2 VIEW & CHEST 1 VIEW)  COMPARISON:  07/27/2012  FINDINGS: Dilated small bowel loops with scattered air-fluid levels in the abdomen. Findings concerning for small bowel obstruction. Calcifications in the center of the pelvis, likely calcified fibroids. No organomegaly. No free air.  IMPRESSION: Dilated small bowel loops with scattered air-fluid levels concerning for small bowel obstruction.   Electronically Signed   By: Rolm Baptise M.D.   On: 12/09/2012 12:22   Dg Abd Portable 1v  12/10/2012   *RADIOLOGY REPORT*  Clinical Data: NG tube placement.  PORTABLE ABDOMEN - 1 VIEW 5:49 p.m.  Comparison: 12/10/2012 at 9:24 a.m.  Findings: NG tube has been inserted and the tip is in the proximal stomach just below the gastroesophageal junction.  There is air throughout the large and small bowel including slightly distended loops of small bowel in the lower abdomen.  Degenerative changes are present throughout the spine.  IMPRESSION: NG tube tip is in the upper stomach just beyond the gastroesophageal junction.  Persistent slight distention of small bowel.  Contrast has passed into the colon.   Original Report Authenticated By: Lorriane Shire, M.D.    Scheduled Meds: . amiodarone  200 mg Oral Daily  . insulin aspart  0-5 Units Subcutaneous QHS  . insulin aspart  0-9 Units Subcutaneous TID WC  . metoprolol  2.5 mg Intravenous Q12H  . Warfarin - Pharmacist Dosing Inpatient   Does not apply q1800   Continuous Infusions: . 0.9 % NaCl with KCl 20 mEq / L 75 mL/hr at 12/15/12 0054  . diltiazem (CARDIZEM) infusion  15 mg/hr (12/14/12 1731)  . heparin 1,150 Units/hr (12/14/12 1628)     Time spent: 25 minutes.   LOS: 6 days   [facility] Pager [phone].   *Please note that the hospitalists switch teams on Wednesdays. Please call the flow manager at [phone] if you are having difficulty reaching the hospitalist taking care of this patient as she can update you and provide the most up-to-date pager number of provider caring for the patient. If 8PM-8AM, please contact night-coverage at [email or web address], password [facility]  12/15/2012, 7:52 AM

## 2012-12-15 NOTE — Progress Notes (Signed)
3 Days Post-Op  Subjective: No real change, she in in ICU with AF/rvr.  She is not in any distress.  NO Flatus so far. They got her up some yesterday, before her AF.  Objective: Vital signs in last 24 hours: Temp:  [97.6 F (36.4 C)-99.9 F (37.7 C)] 99.9 F (37.7 C) (09/24 0000) Pulse Rate:  [72-159] 119 (09/24 0600) Resp:  [18-29] 21 (09/24 0600) BP: (98-146)/(30-96) 130/55 mmHg (09/24 0600) SpO2:  [94 %-95 %] 94 % (09/24 0600) Last BM Date: 12/09/12 PO 180 recorded 400  from NG last PM in the ICU Afebrile HR is up during the night, but it is about 119 now. NA 151, K+ 3.5 Creatinine is up, but stable  Intake/Output from previous day: 09/23 0701 - 09/24 0700 In: 1671 [P.O.:180; I.V.:1491] Out: 1800 [Urine:1700; Emesis/NG output:100] Intake/Output this shift:    General appearance: alert, cooperative, no distress and NG in place Resp: clear to auscultation bilaterally and anterior exam GI: soft, still distended, no BS, incisions look great.  she has a binder in place.  Lab Results:   Recent Labs  12/14/12 0433 12/15/12 0330  WBC 7.4 8.6  HGB 13.1 12.9  HCT 40.6 40.7  PLT 165 185    BMET  Recent Labs  12/15/12 0330  NA 151*  K 3.5  CL 116*  CO2 23  GLUCOSE 151*  BUN 25*  CREATININE 1.61*  CALCIUM 9.7   PT/INR  Recent Labs  12/14/12 0433 12/15/12 0330  LABPROT 15.0 15.0  INR 1.21 1.21     Recent Labs Lab 12/09/12 1038 12/10/12 0431  AST 25 28  ALT 28 23  ALKPHOS 45 37*  BILITOT 0.9 0.9  PROT 7.0 5.9*  ALBUMIN 3.2* 2.7*     Lipase     Component Value Date/Time   LIPASE 26 12/09/2012 1038     Studies/Results: No results found.  Medications: . amiodarone  200 mg Oral Daily  . insulin aspart  0-5 Units Subcutaneous QHS  . insulin aspart  0-9 Units Subcutaneous TID WC  . metoprolol  2.5 mg Intravenous Q12H  . Warfarin - Pharmacist Dosing Inpatient   Does not apply q1800    Assessment/Plan 1. Ventral hernia/SBO/incarcerated SB;  s/p Laparoscopic lysis of adhesions with laparoscopic repair of recurrent ventral hernia. 12/13/2012, Madilyn Hook, DO. 2. Afib on chronic coumadin/amiodarone  3. Hypertension  5. AODM  6. Chronic renal disease  7. Tobacco use 40 years  8. Dyslipidemia  9.Hx of right breast cancer with lumpectomy and radiation Rx.  10. BMI 38  11. Anemia on FE  Plan:  Continue bowel rest and NG till bowel function returns. Defer medical management to Dr. Charlies Silvers.   LOS: 6 days    [name] 12/15/2012

## 2012-12-15 NOTE — Progress Notes (Signed)
ANTICOAGULATION CONSULT NOTE - Follow Up Consult  Pharmacy Consult for Heparin Indication: atrial fibrillation  Allergies  Allergen Reactions  . Lotensin [Benazepril Hcl] Anaphylaxis  . Zantac [Ranitidine Hcl] Anaphylaxis    Patient Measurements: Height: 5\' 6"  (167.6 cm) Weight: 237 lb 7 oz (107.7 kg) IBW/kg (Calculated) : 59.3 Heparin Dosing Weight:   Vital Signs: Temp: 99.9 F (37.7 C) (09/24 0000) Temp src: Oral (09/24 0000) BP: 146/67 mmHg (09/24 0300) Pulse Rate: 96 (09/24 0300)  Labs:  Recent Labs  12/13/12 0650 12/13/12 1527 12/14/12 0433 12/14/12 1250 12/15/12 0005 12/15/12 0330  HGB  --  12.7 13.1  --   --  12.9  HCT  --  39.7 40.6  --   --  40.7  PLT  --  168 165  --   --  185  LABPROT 15.5*  --  15.0  --   --  15.0  INR 1.26  --  1.21  --   --  1.21  HEPARINUNFRC 0.31 0.44 <0.10* 0.27* 0.37  --   CREATININE  --   --   --   --   --  1.61*    Estimated Creatinine Clearance: 36.4 ml/min (by C-G formula based on Cr of 1.61).   Medications:  Infusions:  . 0.9 % NaCl with KCl 20 mEq / L 75 mL/hr at 12/15/12 0054  . diltiazem (CARDIZEM) infusion 15 mg/hr (12/14/12 1731)  . heparin 1,150 Units/hr (12/14/12 1628)    Assessment: Patient with heparin level at goal.  No issues per RN.  Goal of Therapy:  Heparin level 0.3-0.7 units/ml Monitor platelets by anticoagulation protocol: Yes   Plan:  Continue heparin at current rate.  Recheck level at 0800.  Tyler Deis, Shea Stakes Crowford 12/15/2012,5:02 AM

## 2012-12-16 LAB — GLUCOSE, CAPILLARY
Glucose-Capillary: 145 mg/dL — ABNORMAL HIGH (ref 70–99)
Glucose-Capillary: 155 mg/dL — ABNORMAL HIGH (ref 70–99)
Glucose-Capillary: 158 mg/dL — ABNORMAL HIGH (ref 70–99)

## 2012-12-16 LAB — CBC
HCT: 39.2 % (ref 36.0–46.0)
MCHC: 32.7 g/dL (ref 30.0–36.0)
MCV: 81 fL (ref 78.0–100.0)
RDW: 14.8 % (ref 11.5–15.5)

## 2012-12-16 LAB — HEPARIN LEVEL (UNFRACTIONATED): Heparin Unfractionated: 0.42 IU/mL (ref 0.30–0.70)

## 2012-12-16 LAB — BASIC METABOLIC PANEL
BUN: 28 mg/dL — ABNORMAL HIGH (ref 6–23)
Calcium: 9.4 mg/dL (ref 8.4–10.5)
Creatinine, Ser: 1.51 mg/dL — ABNORMAL HIGH (ref 0.50–1.10)
GFR calc non Af Amer: 32 mL/min — ABNORMAL LOW (ref >90)
Glucose, Bld: 167 mg/dL — ABNORMAL HIGH (ref 70–99)
Sodium: 152 mEq/L — ABNORMAL HIGH (ref 135–145)

## 2012-12-16 LAB — PROTIME-INR
INR: 1.42 (ref 0.00–1.49)
Prothrombin Time: 17 seconds — ABNORMAL HIGH (ref 11.6–15.2)

## 2012-12-16 MED ORDER — GUAIFENESIN ER 600 MG PO TB12
600.0000 mg | ORAL_TABLET | Freq: Two times a day (BID) | ORAL | Status: DC
  Administered 2012-12-16 – 2012-12-21 (×11): 600 mg via ORAL
  Filled 2012-12-16 (×12): qty 1

## 2012-12-16 MED ORDER — POTASSIUM CHLORIDE IN NACL 20-0.45 MEQ/L-% IV SOLN
INTRAVENOUS | Status: DC
  Administered 2012-12-16 – 2012-12-18 (×4): via INTRAVENOUS
  Administered 2012-12-19: 20 mL/h via INTRAVENOUS
  Filled 2012-12-16 (×12): qty 1000

## 2012-12-16 MED ORDER — WARFARIN SODIUM 5 MG PO TABS
5.0000 mg | ORAL_TABLET | Freq: Once | ORAL | Status: AC
  Filled 2012-12-16: qty 1

## 2012-12-16 MED ORDER — DILTIAZEM 12 MG/ML ORAL SUSPENSION
90.0000 mg | Freq: Four times a day (QID) | ORAL | Status: DC
  Administered 2012-12-16 – 2012-12-19 (×11): 90 mg via ORAL
  Filled 2012-12-16 (×15): qty 9

## 2012-12-16 NOTE — Progress Notes (Signed)
Physical Therapy Treatment Patient Details Name: [name] MRN: [id number] DOB: Dec 30, 1933 Today's Date: 12/16/2012 Time: [id number] PT Time Calculation (min): 27 min  PT Assessment / Plan / Recommendation  History of Present Illness     PT Comments   POD # 4 pt sitting in recliner with many visitors in good spirit.  Had RN clamp NG tube.  Assisted pt with amb 2 short distances with one sitting rest break.  Avg HR 145 and RA 94% during gait.  C/O "some" ABD pain.  ABD binder in place.   Follow Up Recommendations  SNF     Does the patient have the potential to tolerate intense rehabilitation     Barriers to Discharge        Equipment Recommendations  Rolling walker with 5" wheels    Recommendations for Other Services    Frequency     Progress towards PT Goals Progress towards PT goals: Progressing toward goals  Plan      Precautions / Restrictions Precautions Precautions: Fall Precaution Comments: NG tube, pain abdominal binder, NPO Restrictions Weight Bearing Restrictions: No    Pertinent Vitals/Pain C/o "some" ABD pain    Mobility  Bed Mobility Bed Mobility: Supine to Sit Sit to Supine: 1: +2 Total assist Sit to Supine: Patient Percentage: 20% Details for Bed Mobility Assistance: assisted back to bed Transfers Transfers: Sit to Stand;Stand to Sit Sit to Stand: 1: +2 Total assist;From chair/3-in-1 Sit to Stand: Patient Percentage: 40% Stand to Sit: 1: +2 Total assist;To bed;To chair/3-in-1 Stand to Sit: Patient Percentage: 40% Details for Transfer Assistance: increased time Ambulation/Gait Ambulation/Gait Assistance: 1: +2 Total assist Ambulation/Gait: Patient Percentage: 40% Ambulation Distance (Feet): 50 Feet (25' x 2) Assistive device: Rolling walker Ambulation/Gait Assistance Details: increased time with HR avg 145 and RA 94%.  Pt demon limited activity tolerance and fatigues quickly. Gait Pattern: Step-to pattern;Decreased step length -  right;Decreased step length - left Gait velocity: decreased     PT Goals (current goals can now be found in the care plan section)    Visit Information  Last PT Received On: 12/16/12 Assistance Needed: +2    Subjective Data      Cognition       Balance     End of Session PT - End of Session Equipment Utilized During Treatment: Gait belt Activity Tolerance: Patient limited by pain Patient left: in bed;with call bell/phone within reach;with family/visitor present Nurse Communication: Mobility status   Rica Koyanagi  PTA [facility]  Acute  Rehab Pager      [phone]

## 2012-12-16 NOTE — Progress Notes (Signed)
4 Days Post-Op  Subjective: She has no flatus so far.   She has a thick productive cough she cannot get up completely. No CP but HR up in the 110's   Objective: Vital signs in last 24 hours: Temp:  [97.4 F (36.3 C)-99.3 F (37.4 C)] 97.4 F (36.3 C) (09/25 0400) Pulse Rate:  [95-124] 103 (09/25 0600) Resp:  [20-25] 21 (09/25 0600) BP: (108-148)/(56-73) 125/70 mmHg (09/25 0600) SpO2:  [94 %-96 %] 95 % (09/25 0600) Weight:  [109.2 kg (240 lb 11.9 oz)] 109.2 kg (240 lb 11.9 oz) (09/25 0400) Last BM Date: 12/09/12 400 ml from NG recorded yesterday Still tachycardic, BP OK NA is up, creatinine is up but stable, and glucose is up Intake/Output from previous day: 09/24 0701 - 09/25 0700 In: 2349.5 [I.V.:2259.5; NG/GT:90] Out: [id number] [Urine:1075; Emesis/NG output:400] Intake/Output this shift:    General appearance: alert, cooperative and no distress Resp: clear to auscultation bilaterally and thick cough but she sounds clear to me. GI: soft, non-tender; bowel sounds normal; no masses,  no organomegaly, +BS  Lab Results:   Recent Labs  12/15/12 0330 12/16/12 0325  WBC 8.6 7.8  HGB 12.9 12.8  HCT 40.7 39.2  PLT 185 183    BMET  Recent Labs  12/15/12 0330 12/16/12 0325  NA 151* 152*  K 3.5 3.6  CL 116* 119*  CO2 23 25  GLUCOSE 151* 167*  BUN 25* 28*  CREATININE 1.61* 1.51*  CALCIUM 9.7 9.4   PT/INR  Recent Labs  12/15/12 0330 12/16/12 0325  LABPROT 15.0 17.0*  INR 1.21 1.42     Recent Labs Lab 12/09/12 1038 12/10/12 0431  AST 25 28  ALT 28 23  ALKPHOS 45 37*  BILITOT 0.9 0.9  PROT 7.0 5.9*  ALBUMIN 3.2* 2.7*     Lipase     Component Value Date/Time   LIPASE 26 12/09/2012 1038     Studies/Results: No results found.  Medications: . amiodarone  200 mg Oral Daily  . insulin aspart  0-9 Units Subcutaneous Q4H  . metoprolol  5 mg Intravenous Q12H  . Warfarin - Pharmacist Dosing Inpatient   Does not apply q1800    Assessment/Plan 1.  Ventral hernia/SBO/incarcerated SB; s/p Laparoscopic lysis of adhesions with laparoscopic repair of recurrent ventral hernia. 12/13/2012, Madilyn Hook, DO.  2. Afib on chronic coumadin/amiodarone  3. Hypertension  5. AODM  6. Chronic renal disease  7. Tobacco use 40 years  8. Dyslipidemia  9.Hx of right breast cancer with lumpectomy and radiation Rx.  10. BMI 38  11. Anemia on FE   Plan:  NG sump filter needs to be replaced.  The abd binder is to high and I think to small.  No flatus.  Nurse will address above issues, continue NG drain till she gets some more bowel function.  Continue medical management.  Add flutter valve.         LOS: 7 days    Evany Schecter 12/16/2012

## 2012-12-16 NOTE — Progress Notes (Signed)
CARE MANAGEMENT NOTE 12/16/2012  Patient:  [name], [name]   Account Number:  [id number]  Date Initiated:  12/13/2012  Documentation initiated by:  [facility]  Subjective/Objective Assessment:   79 year old female admitted with umbilical hernia that was incarcerated.     Action/Plan:   From home.   Anticipated DC Date:  12/19/2012   Anticipated DC Plan:  [place]  CM consult      Choice offered to / List presented to:             Status of service:  In process, will continue to follow Medicare Important Message given?  NA - LOS <3 / Initial given by admissions (If response is "NO", the following Medicare IM given date fields will be blank) Date Medicare IM given:   Date Additional Medicare IM given:    Discharge Disposition:    Per UR Regulation:  Reviewed for med. necessity/level of care/duration of stay  If discussed at [place] of Stay Meetings, dates discussed:    Comments:  [id number] Rosana Hoes, RN, BSN, CCM: CHART REVIEWED AND UPDATED.  Next chart review due on [id number]. NO DISCHARGE NEEDS PRESENT AT THIS TIME. Remins in icu on iv cardizem drip d/t a.fib. CASE MANAGEMENT [phone]

## 2012-12-16 NOTE — Progress Notes (Signed)
ANTICOAGULATION CONSULT NOTE - Follow Up Consult  Pharmacy Consult for Heparin IV, Warfarin Indication: atrial fibrillation  Patient Measurements: Height: 5\' 6"  (167.6 cm) Weight: 240 lb 11.9 oz (109.2 kg) IBW/kg (Calculated) : 59.3 Heparin Dosing Weight: 84 kg  Labs:  Recent Labs  12/14/12 0433  12/15/12 0005 12/15/12 0330 12/15/12 0830 12/16/12 0325  HGB 13.1  --   --  12.9  --  12.8  HCT 40.6  --   --  40.7  --  39.2  PLT 165  --   --  185  --  183  LABPROT 15.0  --   --  15.0  --  17.0*  INR 1.21  --   --  1.21  --  1.42  HEPARINUNFRC <0.10*  < > 0.37  --  0.44 0.42  CREATININE  --   --   --  1.61*  --  1.51*  < > = values in this interval not displayed.   Assessment: 59 yoF on chronic warfarin therapy PTA for atrial fibrillation presented 9/18 with ventral hernia/SBO/incarcerated SB.  Warfarin was placed on hold (last dose given 9/18) and heparin bridge started 9/19 for surgery this admission. S/p lap ventral hernia repair on 12/12/12.  Heparin resumed 8 hours post-op. Warfarin resumed 9/23. Of note, patient received Vitamin K 10mg  SQ on 9/19, and FFP on 9/20. Warfarin dose PTA reported as 5 mg daily.   IV heparin running at 1150 units/hr and heparin level has been therapeutic. INR trending up to 1.42. Patient NPO. CBC wnl, no bleeding. Amiodarone from PTA resumed inpatient.   Goal of Therapy:  Heparin level 0.3-0.7 units/ml Monitor platelets by anticoagulation protocol: Yes   Plan:   Coumadin 5mg  po x 1 tonight  Continue IV heparin at 1150 units/hr (11.5 mL/hr)  Daily INR, heparin level, and CBC.  Continue to monitor platelets and for bleeding.  Vanessa [place], PharmD, BCPS Pager: [phone] 9:47 AM Pharmacy #: [phone]

## 2012-12-16 NOTE — Progress Notes (Addendum)
[facility] PROGRESS NOTE  [name] [id number] DOB: [birth year] DOA: 12/09/2012 PCP: Milagros Evener, MD  Brief narrative: [name] is an 79 y.o. female with a PMH of diabetes, hypertension, chronic kidney disease, dyslipidemia, and atrial fibrillation who was admitted on 12/09/2012 with a small bowel obstruction and incarcerated ventral hernia who underwent exploratory laparoscopy with lysis of adhesions and repair of recurrent ventral hernia by Dr. Lilyan Punt on 12/13/2012. Her postoperative course has been complicated by atrial fibrillation with rapid ventricular response necessitating transfer to the step down unit on 12/14/2012.  Assessment/Plan: Principal Problem:   SBO (small bowel obstruction) secondary to incarcerated umbilical hernia -Status post exploratory laparoscopy with lysis of adhesions and repair of recurrent ventral hernia 12/13/2012. -Maintain NG tube to low intermittent suction until bowel sounds return and passing flatus. Active Problems:   Hypernatremia -Change IV fluids to hypotonic.   Chronic diastolic CHF -BNP [id number] on 12/13/2012. -2-D echocardiogram done 12/15/2012 with EF 60-65% and no regional wall motion abnormalities or mention of diastolic dysfunction.   HTN (hypertension) -Controlled on metoprolol, amiodarone and Cardizem.   Dyslipidemia -Resume statin when diet advanced.   Atrial fibrillation -Continue amiodarone, metoprolol and Cardizem drip. -Heparin/Coumadin resumed 12/14/2012. -Seen by cardiology 12/13/2012.   Diabetes -Hemoglobin 8.1%. Seen by diabetes coordinator. -Controlled on SSI Q 4 hours while n.p.o. -CBG is 145-158.   CKD (chronic kidney disease), stage III -Creatinine appears to be better than usual baseline. Creatinine was 1.7 in 2012.    Code Status: Full. Family Communication: Husband updated at the bedside. Disposition Plan: Home versus SNF when stable.   Medical Consultants:  Dr. Madilyn Hook, Surgery  Dr. Daneen Schick III, Cardiology  Other Consultants:  Physical therapy  Diabetes coordinator  Anti-infectives:  None.  HPI/Subjective: [name] continues to have some abdominal soreness. She still has not passed any flatus or had any bowel movements postoperatively. No dyspnea although she does have a cough productive of thick yellow mucous at times.  Objective: Filed Vitals:   12/16/12 0000 12/16/12 0200 12/16/12 0400 12/16/12 0600  BP: 130/59 108/73 131/64 125/70  Pulse: 95 95 98 103  Temp: 97.6 F (36.4 C)  97.4 F (36.3 C)   TempSrc: Oral  Oral   Resp: 24 22 21 21   Height:      Weight:   109.2 kg (240 lb 11.9 oz)   SpO2: 96% 96% 95% 95%    Intake/Output Summary (Last 24 hours) at 12/16/12 0735 Last data filed at 12/16/12 0600  Gross per 24 hour  Intake 2349.5 ml  Output   1475 ml  Net  874.5 ml    Exam: Gen:  NAD Cardiovascular:  Tachycardic, irregular. Respiratory:  Lungs CTAB Gastrointestinal:  Abdomen soft, NT/ND, + BS Extremities:  No C/E/C  Data Reviewed: Basic Metabolic Panel:  Recent Labs Lab 12/09/12 1038 12/10/12 0431 12/15/12 0330 12/16/12 0325  NA 138 138 151* 152*  K 3.5 3.7 3.5 3.6  CL 99 102 116* 119*  CO2 28 27 23 25   GLUCOSE 203* 141* 151* 167*  BUN 26* 30* 25* 28*  CREATININE 1.46* 1.51* 1.61* 1.51*  CALCIUM 9.5 8.6 9.7 9.4   GFR Estimated Creatinine Clearance: 39.1 ml/min (by C-G formula based on Cr of 1.51). Liver Function Tests:  Recent Labs Lab 12/09/12 1038 12/10/12 0431  AST 25 28  ALT 28 23  ALKPHOS 45 37*  BILITOT 0.9 0.9  PROT 7.0 5.9*  ALBUMIN 3.2* 2.7*    Recent Labs Lab 12/09/12  1038  LIPASE 26   Coagulation profile  Recent Labs Lab 12/12/12 0550 12/13/12 0650 12/14/12 0433 12/15/12 0330 12/16/12 0325  INR 1.23 1.26 1.21 1.21 1.42    CBC:  Recent Labs Lab 12/09/12 1038  12/12/12 0550 12/13/12 1527 12/14/12 0433 12/15/12 0330 12/16/12 0325  WBC 9.7  < > 8.0 5.4 7.4 8.6 7.8  NEUTROABS  7.3  --   --   --   --   --   --   HGB 15.4*  < > 13.7 12.7 13.1 12.9 12.8  HCT 45.3  < > 42.0 39.7 40.6 40.7 39.2  MCV 77.8*  < > 79.5 80.7 81.2 81.6 81.0  PLT 193  < > 179 168 165 185 183  < > = values in this interval not displayed. BNP (last 3 results)  Recent Labs  12/13/12 0650  PROBNP 1325.0*   CBG:  Recent Labs Lab 12/15/12 1146 12/15/12 1639 12/15/12 1923 12/16/12 0013 12/16/12 0330  GLUCAP 158* 153* 149* 145* 155*   Microbiology Recent Results (from the past 240 hour(s))  CULTURE, BLOOD (ROUTINE X 2)     Status: None   Collection Time    12/13/12 12:12 PM      Result Value Range Status   Specimen Description BLOOD LEFT HAND   Final   Special Requests BOTTLES DRAWN AEROBIC AND ANAEROBIC [facility]   Final   Culture  Setup Time     Final   Value: 12/13/2012 15:44     Performed at [facility]   Culture     Final   Value:        BLOOD CULTURE RECEIVED NO GROWTH TO DATE CULTURE WILL BE HELD FOR 5 DAYS BEFORE ISSUING A FINAL NEGATIVE REPORT     Performed at [facility]   Report Status PENDING   Incomplete  CULTURE, BLOOD (ROUTINE X 2)     Status: None   Collection Time    12/13/12 12:20 PM      Result Value Range Status   Specimen Description BLOOD LEFT HAND   Final   Special Requests BOTTLES DRAWN AEROBIC AND ANAEROBIC 4CC   Final   Culture  Setup Time     Final   Value: 12/13/2012 15:44     Performed at [facility]   Culture     Final   Value:        BLOOD CULTURE RECEIVED NO GROWTH TO DATE CULTURE WILL BE HELD FOR 5 DAYS BEFORE ISSUING A FINAL NEGATIVE REPORT     Performed at [facility]   Report Status PENDING   Incomplete  MRSA PCR SCREENING     Status: None   Collection Time    12/14/12  4:04 PM      Result Value Range Status   MRSA by PCR NEGATIVE  NEGATIVE Final   Comment:            The GeneXpert MRSA Assay (FDA     approved for NASAL specimens     only), is one component of a     comprehensive MRSA colonization      surveillance program. It is not     intended to diagnose MRSA     infection nor to guide or     monitor treatment for     MRSA infections.     Procedures and Diagnostic Studies: Ct Abdomen Pelvis Wo Contrast  12/09/2012   *RADIOLOGY REPORT*  Clinical Data: Lower pelvic pain  CT ABDOMEN AND PELVIS WITHOUT CONTRAST  Technique:  Multidetector CT imaging of the abdomen and pelvis was performed following the standard protocol without intravenous contrast.  Comparison: None.  Findings: Visualized lung bases appear normal.  Hepatic cyst is noted inferiorly in right hepatic lobe.  The spleen and pancreas appear normal.  No gallstones are noted.  Adrenal glands and kidneys appear normal.  No renal or ureteral calculi are noted. Urinary bladder appears normal.  Calcified fibroids are seen involving the uterus.  Phleboliths are noted in the pelvis. Sigmoid diverticulosis is noted without inflammation.  There is noted a ventral hernia in the right lower quadrant of the abdomen which contains a loop of incarcerated terminal ileum and results in partial obstruction of the more proximal small bowel.  Wall thickening and inflammatory changes are seen involving the more distal terminal ileum extending to the ileocecal valve.  No abnormal fluid collection is noted.  Contrast is noted within the colon as well as stool.  IMPRESSION: Ventral hernia is noted in the right lower quadrant of the anterior abdominal wall which contains a loop of incarcerated terminal ileum, resulting in partial small bowel obstruction and dilatation of the more proximal small bowel.   Original Report Authenticated By: Marijo Conception.,  M.D.   Dg Abd 2 Views  12/10/2012   CLINICAL DATA:  Evaluate for small bowel obstruction. Periumbilical hernia with prior surgery. Now with protruding hernia right abdominal region and occasional constipation.  EXAM: ABDOMEN - 2 VIEW  COMPARISON:  12/09/2012 CT.  FINDINGS: Small bowel obstructive pattern with  contrast seen in portions of the colon. Gas distended small bowel loops measuring up to 5 cm with thickened folds.  No free intraperitoneal air detected.  IMPRESSION: Small bowel obstructive pattern with contrast seen in portions of the colon. Gas distended small bowel loops measuring up to 5 cm with thickened folds. No free intraperitoneal air detected.   Electronically Signed   By: Chauncey Cruel   On: 12/10/2012 10:36   Dg Abd Acute W/chest  12/09/2012   CLINICAL DATA:  Abdominal pain, hypertension.  EXAM: ACUTE ABDOMEN SERIES (ABDOMEN 2 VIEW & CHEST 1 VIEW)  COMPARISON:  07/27/2012  FINDINGS: Dilated small bowel loops with scattered air-fluid levels in the abdomen. Findings concerning for small bowel obstruction. Calcifications in the center of the pelvis, likely calcified fibroids. No organomegaly. No free air.  IMPRESSION: Dilated small bowel loops with scattered air-fluid levels concerning for small bowel obstruction.   Electronically Signed   By: Rolm Baptise M.D.   On: 12/09/2012 12:22   Dg Abd Portable 1v  12/10/2012   *RADIOLOGY REPORT*  Clinical Data: NG tube placement.  PORTABLE ABDOMEN - 1 VIEW 5:49 p.m.  Comparison: 12/10/2012 at 9:24 a.m.  Findings: NG tube has been inserted and the tip is in the proximal stomach just below the gastroesophageal junction.  There is air throughout the large and small bowel including slightly distended loops of small bowel in the lower abdomen.  Degenerative changes are present throughout the spine.  IMPRESSION: NG tube tip is in the upper stomach just beyond the gastroesophageal junction.  Persistent slight distention of small bowel.  Contrast has passed into the colon.   Original Report Authenticated By: Lorriane Shire, M.D.   2-D echocardiogram  12/15/2012  Study Conclusions  - Left ventricle: The cavity size was normal. Systolic function was normal. The estimated ejection fraction was in the range of 60% to 65%. Although no diagnostic regional wall  motion  abnormality was identified, this possibility cannot be completely excluded on the basis of this study. - Aortic valve: Mild regurgitation. - Mitral valve: Calcified annulus. Mildly thickened leaflets . Mild regurgitation. - Left atrium: The atrium was mildly dilated. - Pulmonary arteries: Systolic pressure was moderately increased. PA peak pressure: 46mm Hg (S).  Scheduled Meds: . amiodarone  200 mg Oral Daily  . insulin aspart  0-9 Units Subcutaneous Q4H  . metoprolol  5 mg Intravenous Q12H  . Warfarin - Pharmacist Dosing Inpatient   Does not apply q1800   Continuous Infusions: . 0.9 % NaCl with KCl 20 mEq / L 75 mL/hr at 12/16/12 0511  . diltiazem (CARDIZEM) infusion 15 mg/hr (12/16/12 0512)  . heparin 1,150 Units/hr (12/16/12 0512)    Time spent: 25 minutes.   LOS: 7 days   [facility] Pager [phone].   *Please note that the hospitalists switch teams on Wednesdays. Please call the flow manager at [phone] if you are having difficulty reaching the hospitalist taking care of this patient as she can update you and provide the most up-to-date pager number of provider caring for the patient. If 8PM-8AM, please contact night-coverage at [email or web address], password [facility]  12/16/2012, 7:35 AM

## 2012-12-16 NOTE — Progress Notes (Signed)
INITIAL NUTRITION ASSESSMENT  DOCUMENTATION CODES Per approved criteria  -Obesity Unspecified   INTERVENTION: If unable to advance diet soon, recommend initiation of nutrition support. Pt has been without nutrition x 7 days. RD to continue to follow nutrition care plan.  NUTRITION DIAGNOSIS: Inadequate oral intake related to inability to eat as evidenced by NPO status.   Goal: Initiation of diet vs nutrition support.  Intake to meet >90% of estimated nutrition needs.  Monitor:  weight trends, lab trends, I/O's, diet advancement vs initiation of nutrition support  Reason for Assessment: Prolonged NPO Status  79 y.o. female  Admitting Dx: SBO (small bowel obstruction)  ASSESSMENT: PMHx significant for DM, HTN, CKD, dyslipidemia. Admitted with abdominal pain, poor oral intake, and nausea. Work-up reveals incarcerated ventral hernia and possible obstruction.  Underwent the following on 9/21: LAPAROSCOPIC VENTRAL HERNIA INSERTION OF MESH  LAPAROSCOPIC LYSIS OF ADHESIONS  Pt continues without flatus. Currently with NGT to suction, had 400 ml output yesterday.  Pt has been here for 7 days, diet has not advanced beyond NPO.  Discussed nutrition hx with patient. She is hungry and ready to eat, complains of very dry mouth. Was eating well prior to onset of symptoms, typically does not use bottom dentures when eating because they are "old." May need texture modified diet once advanced. States her weight has been stable PTA.  Height: Ht Readings from Last 1 Encounters:  12/09/12 5\' 6"  (1.676 m)    Weight: Wt Readings from Last 1 Encounters:  12/16/12 240 lb 11.9 oz (109.2 kg)  Admit wt 235 lb.  Ideal Body Weight: 130 lb  % Ideal Body Weight: 185%  Wt Readings from Last 10 Encounters:  12/16/12 240 lb 11.9 oz (109.2 kg)  12/16/12 240 lb 11.9 oz (109.2 kg)    Usual Body Weight: 230 - 240 lb  % Usual Body Weight: 100%  BMI:  Body mass index is 38.88 kg/(m^2). Obese  Class II  Estimated Nutritional Needs: Kcal: 1650 - 1900 Protein: 78 - 98 g Fluid: 1.7 - 1.9 liters daily  Skin: abdominal incision  Diet Order: NPO  EDUCATION NEEDS: -No education needs identified at this time   Intake/Output Summary (Last 24 hours) at 12/16/12 1038 Last data filed at 12/16/12 0900  Gross per 24 hour  Intake 2094.5 ml  Output   1760 ml  Net  334.5 ml    Last BM: 9/18  Labs:   Recent Labs Lab 12/10/12 0431 12/15/12 0330 12/16/12 0325  NA 138 151* 152*  K 3.7 3.5 3.6  CL 102 116* 119*  CO2 27 23 25   BUN 30* 25* 28*  CREATININE 1.51* 1.61* 1.51*  CALCIUM 8.6 9.7 9.4  GLUCOSE 141* 151* 167*    CBG (last 3)   Recent Labs  12/15/12 1923 12/16/12 0013 12/16/12 0330  GLUCAP 149* 145* 155*    Scheduled Meds: . amiodarone  200 mg Oral Daily  . guaiFENesin  600 mg Oral BID  . insulin aspart  0-9 Units Subcutaneous Q4H  . metoprolol  5 mg Intravenous Q12H  . warfarin  5 mg Oral ONCE-1800  . Warfarin - Pharmacist Dosing Inpatient   Does not apply q1800    Continuous Infusions: . 0.45 % NaCl with KCl 20 mEq / L 100 mL/hr at 12/16/12 0900  . diltiazem (CARDIZEM) infusion 15 mg/hr (12/16/12 0512)  . heparin 1,150 Units/hr (12/16/12 [id number])    Past Medical History  Diagnosis Date  . Hypertension   . Diabetes mellitus without  complication   . Irregular heart beat   . Renal disorder     Past Surgical History  Procedure Laterality Date  . Hernia repair    . Right lumpectomy    . Cataract extraction, bilateral    . Ventral hernia repair N/A 12/12/2012    Procedure: LAPAROSCOPIC VENTRAL HERNIA;  Surgeon: Madilyn Hook, DO;  Location: [facility];  Service: General;  Laterality: N/A;  . Insertion of mesh N/A 12/12/2012    Procedure: INSERTION OF MESH;  Surgeon: Madilyn Hook, DO;  Location: [facility];  Service: General;  Laterality: N/A;  . Laparoscopic lysis of adhesions N/A 12/12/2012    Procedure: LAPAROSCOPIC LYSIS OF ADHESIONS;  Surgeon: Madilyn Hook, DO;  Location: [facility];  Service: General;  Laterality: N/A;    Inda Coke MS, RD, LDN Pager: [phone] After-hours pager: [phone]

## 2012-12-17 ENCOUNTER — Encounter (HOSPITAL_COMMUNITY): Payer: Self-pay | Admitting: General Surgery

## 2012-12-17 DIAGNOSIS — K567 Ileus, unspecified: Secondary | ICD-10-CM

## 2012-12-17 HISTORY — DX: Ileus, unspecified: K56.7

## 2012-12-17 LAB — CBC
HCT: 43.3 % (ref 36.0–46.0)
Hemoglobin: 13.8 g/dL (ref 12.0–15.0)
MCHC: 31.9 g/dL (ref 30.0–36.0)
RBC: 5.33 MIL/uL — ABNORMAL HIGH (ref 3.87–5.11)

## 2012-12-17 LAB — GLUCOSE, CAPILLARY
Glucose-Capillary: 130 mg/dL — ABNORMAL HIGH (ref 70–99)
Glucose-Capillary: 142 mg/dL — ABNORMAL HIGH (ref 70–99)
Glucose-Capillary: 183 mg/dL — ABNORMAL HIGH (ref 70–99)
Glucose-Capillary: 161 mg/dL — ABNORMAL HIGH (ref 70–99)

## 2012-12-17 LAB — PROTIME-INR
INR: 1.76 — ABNORMAL HIGH (ref 0.00–1.49)
Prothrombin Time: 20 seconds — ABNORMAL HIGH (ref 11.6–15.2)

## 2012-12-17 LAB — HEPARIN LEVEL (UNFRACTIONATED): Heparin Unfractionated: 0.35 IU/mL (ref 0.30–0.70)

## 2012-12-17 LAB — BASIC METABOLIC PANEL
CO2: 23 mEq/L (ref 19–32)
Calcium: 9.1 mg/dL (ref 8.4–10.5)
GFR calc Af Amer: 43 mL/min — ABNORMAL LOW (ref >90)
Glucose, Bld: 159 mg/dL — ABNORMAL HIGH (ref 70–99)
Potassium: 4.1 mEq/L (ref 3.5–5.1)
Sodium: 154 mEq/L — ABNORMAL HIGH (ref 135–145)

## 2012-12-17 MED ORDER — WARFARIN SODIUM 3 MG PO TABS
3.0000 mg | ORAL_TABLET | Freq: Every day | ORAL | Status: AC
  Administered 2012-12-17: 3 mg via ORAL
  Filled 2012-12-17: qty 1

## 2012-12-17 NOTE — Care Management Note (Addendum)
    Page 1 of 2   12/21/2012     11:01:37 AM   CARE MANAGEMENT NOTE 12/21/2012  Patient:  [name], [name]   Account Number:  [id number]  Date Initiated:  12/13/2012  Documentation initiated by:  [facility]  Subjective/Objective Assessment:   79 year old female admitted with umbilical hernia that was incarcerated.     Action/Plan:   From home.   Anticipated DC Date:  12/21/2012   Anticipated DC Plan:  [place]  CM consult      Choice offered to / List presented to:             Status of service:  Completed, signed off Medicare Important Message given?  NA - LOS <3 / Initial given by admissions (If response is "NO", the following Medicare IM given date fields will be blank) Date Medicare IM given:   Date Additional Medicare IM given:    Discharge Disposition:  [place]  Per UR Regulation:  Reviewed for med. necessity/level of care/duration of stay  If discussed at [place] of Stay Meetings, dates discussed:   12/21/2012    Comments:  12/21/12 Berta Denson RN,BSN NCM [phone] D/C SNF.  12/20/12 Jalyiah Shelley RN,BSN NCM [phone] SBO-SX-POD#8-STABLE.BUN,CREAT ELEVATED.CHEST TIGHTNESS,?CHF.02,INSULIN.D/C PLAN SNF.  12/17/12 Annalyce Lanpher RN,BSN NCM [phone] TRANSFER FROM SDU.POD#5 VENTRAL HERNIA REPAIR,LOA.P/O ILEUS.AFIB W/RVR.NGT D/C, STARTING CLEARS,IVF@125 ,HEPARIN GTT,LOPRESSOR IV,MORPHINE IV.PT-SNF.  [id number] Rosana Hoes, RN, BSN, CCM: CHART REVIEWED AND UPDATED.  Next chart review due on [id number]. NO DISCHARGE NEEDS PRESENT AT THIS TIME. Remins in icu on iv cardizem drip d/t a.fib. CASE MANAGEMENT [phone]

## 2012-12-17 NOTE — Progress Notes (Signed)
[facility] PROGRESS NOTE  [name] [id number] DOB: [birth year] DOA: 12/09/2012 PCP: Milagros Evener, MD  Brief narrative: [name] is an 79 y.o. female with a PMH of diabetes, hypertension, chronic kidney disease, dyslipidemia, and atrial fibrillation who was admitted on 12/09/2012 with a small bowel obstruction and incarcerated ventral hernia who underwent exploratory laparoscopy with lysis of adhesions and repair of recurrent ventral hernia by Dr. Lilyan Punt on 12/13/2012. Her postoperative course has been complicated by atrial fibrillation with rapid ventricular response necessitating transfer to the step down unit on 12/14/2012.  Assessment/Plan: Principal Problem:   SBO (small bowel obstruction) secondary to incarcerated umbilical hernia -Status post exploratory laparoscopy with lysis of adhesions and repair of recurrent ventral hernia 12/13/2012. -Reports having had some flatus, plan to clamp NG tube and if tolerates, start some clear liquids. Active Problems:   Hypernatremia -Increase IV fluids, still hypernatremic.   ? Chronic diastolic CHF -BNP [id number] on 12/13/2012. -2-D echocardiogram done 12/15/2012 with EF 60-65% and no regional wall motion abnormalities or mention of diastolic dysfunction. -No clinical evidence of decompensated heart failure.   HTN (hypertension) -Controlled on metoprolol, amiodarone and Cardizem.   Dyslipidemia -Resume statin when diet advanced.   Atrial fibrillation -Continue amiodarone, metoprolol and Cardizem drip. -Heparin/Coumadin resumed 12/14/2012. -Seen by cardiology 12/13/2012.   Diabetes -Hemoglobin 8.1%. Seen by diabetes coordinator. -Controlled on SSI Q 4 hours while n.p.o. -CBG is 130-162.   CKD (chronic kidney disease), stage III -Creatinine appears to be better than usual baseline. Creatinine was 1.7 in 2012.  Code Status: Full. Family Communication: Husband updated at the bedside. Disposition Plan: Home versus SNF when  stable.   Medical Consultants:  Dr. Madilyn Hook, Surgery  Dr. Daneen Schick III, Cardiology  Other Consultants:  Physical therapy  Diabetes coordinator  Anti-infectives:  None.  HPI/Subjective: [name] reports having passed a little bit of flatus when up and about yesterday. No nausea or vomiting with clamping of the NG tube yesterday. No dyspnea. Still has a productive cough.  Objective: Filed Vitals:   12/17/12 0000 12/17/12 0200 12/17/12 0400 12/17/12 0600  BP: 149/73 130/65 140/56 118/68  Pulse: 101 94 87 95  Temp: 97.6 F (36.4 C)  98.2 F (36.8 C)   TempSrc: Oral  Oral   Resp: 20 15 18  36  Height:      Weight:   112.2 kg (247 lb 5.7 oz)   SpO2: 95% 94% 93% 95%    Intake/Output Summary (Last 24 hours) at 12/17/12 0742 Last data filed at 12/17/12 0700  Gross per 24 hour  Intake   2691 ml  Output   1910 ml  Net    781 ml    Exam: Gen:  NAD Cardiovascular:  Tachycardic, irregular. Respiratory:  Lungs CTAB Gastrointestinal:  Abdomen soft, NT/ND, + BS Extremities:  No C/E/C  Data Reviewed: Basic Metabolic Panel:  Recent Labs Lab 12/15/12 0330 12/16/12 0325 12/17/12 0340  NA 151* 152* 154*  K 3.5 3.6 4.1  CL 116* 119* 121*  CO2 23 25 23   GLUCOSE 151* 167* 159*  BUN 25* 28* 26*  CREATININE 1.61* 1.51* 1.35*  CALCIUM 9.7 9.4 9.1   GFR Estimated Creatinine Clearance: 44.3 ml/min (by C-G formula based on Cr of 1.35). Liver Function Tests: No results found for this basename: AST, ALT, ALKPHOS, BILITOT, PROT, ALBUMIN,  in the last 168 hours No results found for this basename: LIPASE, AMYLASE,  in the last 168 hours Coagulation profile  Recent Labs Lab 12/13/12  [id number] 12/14/12 0433 12/15/12 0330 12/16/12 0325 12/17/12 0340  INR 1.26 1.21 1.21 1.42 1.76*    CBC:  Recent Labs Lab 12/13/12 1527 12/14/12 0433 12/15/12 0330 12/16/12 0325 12/17/12 0340  WBC 5.4 7.4 8.6 7.8 7.0  HGB 12.7 13.1 12.9 12.8 13.8  HCT 39.7 40.6 40.7 39.2  43.3  MCV 80.7 81.2 81.6 81.0 81.2  PLT 168 165 185 183 182   BNP (last 3 results)  Recent Labs  12/13/12 0650  PROBNP 1325.0*   CBG:  Recent Labs Lab 12/16/12 1151 12/16/12 1603 12/16/12 2001 12/17/12 0021 12/17/12 0349  GLUCAP 162* 158* 138* 130* 71*   Microbiology Recent Results (from the past 240 hour(s))  CULTURE, BLOOD (ROUTINE X 2)     Status: None   Collection Time    12/13/12 12:12 PM      Result Value Range Status   Specimen Description BLOOD LEFT HAND   Final   Special Requests BOTTLES DRAWN AEROBIC AND ANAEROBIC 6CC   Final   Culture  Setup Time     Final   Value: 12/13/2012 15:44     Performed at [facility]   Culture     Final   Value:        BLOOD CULTURE RECEIVED NO GROWTH TO DATE CULTURE WILL BE HELD FOR 5 DAYS BEFORE ISSUING A FINAL NEGATIVE REPORT     Performed at [facility]   Report Status PENDING   Incomplete  CULTURE, BLOOD (ROUTINE X 2)     Status: None   Collection Time    12/13/12 12:20 PM      Result Value Range Status   Specimen Description BLOOD LEFT HAND   Final   Special Requests BOTTLES DRAWN AEROBIC AND ANAEROBIC 4CC   Final   Culture  Setup Time     Final   Value: 12/13/2012 15:44     Performed at [facility]   Culture     Final   Value:        BLOOD CULTURE RECEIVED NO GROWTH TO DATE CULTURE WILL BE HELD FOR 5 DAYS BEFORE ISSUING A FINAL NEGATIVE REPORT     Performed at [facility]   Report Status PENDING   Incomplete  MRSA PCR SCREENING     Status: None   Collection Time    12/14/12  4:04 PM      Result Value Range Status   MRSA by PCR NEGATIVE  NEGATIVE Final   Comment:            The GeneXpert MRSA Assay (FDA     approved for NASAL specimens     only), is one component of a     comprehensive MRSA colonization     surveillance program. It is not     intended to diagnose MRSA     infection nor to guide or     monitor treatment for     MRSA infections.     Procedures and  Diagnostic Studies: Ct Abdomen Pelvis Wo Contrast  12/09/2012   *RADIOLOGY REPORT*  Clinical Data: Lower pelvic pain  CT ABDOMEN AND PELVIS WITHOUT CONTRAST  Technique:  Multidetector CT imaging of the abdomen and pelvis was performed following the standard protocol without intravenous contrast.  Comparison: None.  Findings: Visualized lung bases appear normal.  Hepatic cyst is noted inferiorly in right hepatic lobe.  The spleen and pancreas appear normal.  No gallstones are noted.  Adrenal glands and kidneys appear  normal.  No renal or ureteral calculi are noted. Urinary bladder appears normal.  Calcified fibroids are seen involving the uterus.  Phleboliths are noted in the pelvis. Sigmoid diverticulosis is noted without inflammation.  There is noted a ventral hernia in the right lower quadrant of the abdomen which contains a loop of incarcerated terminal ileum and results in partial obstruction of the more proximal small bowel.  Wall thickening and inflammatory changes are seen involving the more distal terminal ileum extending to the ileocecal valve.  No abnormal fluid collection is noted.  Contrast is noted within the colon as well as stool.  IMPRESSION: Ventral hernia is noted in the right lower quadrant of the anterior abdominal wall which contains a loop of incarcerated terminal ileum, resulting in partial small bowel obstruction and dilatation of the more proximal small bowel.   Original Report Authenticated By: Marijo Conception.,  M.D.   Dg Abd 2 Views  12/10/2012   CLINICAL DATA:  Evaluate for small bowel obstruction. Periumbilical hernia with prior surgery. Now with protruding hernia right abdominal region and occasional constipation.  EXAM: ABDOMEN - 2 VIEW  COMPARISON:  12/09/2012 CT.  FINDINGS: Small bowel obstructive pattern with contrast seen in portions of the colon. Gas distended small bowel loops measuring up to 5 cm with thickened folds.  No free intraperitoneal air detected.  IMPRESSION:  Small bowel obstructive pattern with contrast seen in portions of the colon. Gas distended small bowel loops measuring up to 5 cm with thickened folds. No free intraperitoneal air detected.   Electronically Signed   By: Chauncey Cruel   On: 12/10/2012 10:36   Dg Abd Acute W/chest  12/09/2012   CLINICAL DATA:  Abdominal pain, hypertension.  EXAM: ACUTE ABDOMEN SERIES (ABDOMEN 2 VIEW & CHEST 1 VIEW)  COMPARISON:  07/27/2012  FINDINGS: Dilated small bowel loops with scattered air-fluid levels in the abdomen. Findings concerning for small bowel obstruction. Calcifications in the center of the pelvis, likely calcified fibroids. No organomegaly. No free air.  IMPRESSION: Dilated small bowel loops with scattered air-fluid levels concerning for small bowel obstruction.   Electronically Signed   By: Rolm Baptise M.D.   On: 12/09/2012 12:22   Dg Abd Portable 1v  12/10/2012   *RADIOLOGY REPORT*  Clinical Data: NG tube placement.  PORTABLE ABDOMEN - 1 VIEW 5:49 p.m.  Comparison: 12/10/2012 at 9:24 a.m.  Findings: NG tube has been inserted and the tip is in the proximal stomach just below the gastroesophageal junction.  There is air throughout the large and small bowel including slightly distended loops of small bowel in the lower abdomen.  Degenerative changes are present throughout the spine.  IMPRESSION: NG tube tip is in the upper stomach just beyond the gastroesophageal junction.  Persistent slight distention of small bowel.  Contrast has passed into the colon.   Original Report Authenticated By: Lorriane Shire, M.D.   2-D echocardiogram  12/15/2012  Study Conclusions  - Left ventricle: The cavity size was normal. Systolic function was normal. The estimated ejection fraction was in the range of 60% to 65%. Although no diagnostic regional wall motion abnormality was identified, this possibility cannot be completely excluded on the basis of this study. - Aortic valve: Mild regurgitation. - Mitral valve:  Calcified annulus. Mildly thickened leaflets . Mild regurgitation. - Left atrium: The atrium was mildly dilated. - Pulmonary arteries: Systolic pressure was moderately increased. PA peak pressure: 45mm Hg (S).  Scheduled Meds: . amiodarone  200 mg Oral Daily  .  diltiazem  90 mg Oral Q6H  . guaiFENesin  600 mg Oral BID  . insulin aspart  0-9 Units Subcutaneous Q4H  . metoprolol  5 mg Intravenous Q12H  . warfarin  5 mg Oral ONCE-1800  . Warfarin - Pharmacist Dosing Inpatient   Does not apply q1800   Continuous Infusions: . 0.45 % NaCl with KCl 20 mEq / L 100 mL/hr at 12/16/12 1829  . diltiazem (CARDIZEM) infusion 5 mg/hr (12/17/12 0100)  . heparin 1,150 Units/hr (12/17/12 0247)    Time spent: 25 minutes.   LOS: 8 days   [facility] Pager [phone].   *Please note that the hospitalists switch teams on Wednesdays. Please call the flow manager at [phone] if you are having difficulty reaching the hospitalist taking care of this patient as she can update you and provide the most up-to-date pager number of provider caring for the patient. If 8PM-8AM, please contact night-coverage at [email or web address], password [facility]  12/17/2012, 7:42 AM

## 2012-12-17 NOTE — Progress Notes (Signed)
Physical Therapy Treatment Patient Details Name: [name] MRN: [id number] DOB: 04-23-1933 Today's Date: 12/17/2012 Time: [id number] PT Time Calculation (min): 14 min  PT Assessment / Plan / Recommendation  History of Present Illness Pt is s/p exploratory lap with LOA and hernia repair 12/13/12   PT Comments   *Progressing well with mobility. HR up to 139 with walking.**  Follow Up Recommendations  SNF     Does the patient have the potential to tolerate intense rehabilitation     Barriers to Discharge        Equipment Recommendations  Rolling walker with 5" wheels    Recommendations for Other Services    Frequency Min 4X/week   Progress towards PT Goals Progress towards PT goals: Progressing toward goals  Plan      Precautions / Restrictions Precautions Precautions: Fall Precaution Comments: abdominal help Restrictions Weight Bearing Restrictions: No   Pertinent Vitals/Pain *HR 139 with walking, 108 at rest Pt requested pain meds from RN for abdominal pain**    Mobility  Transfers Transfers: Sit to Stand;Stand to Sit Sit to Stand: 1: +2 Total assist;From chair/3-in-1 Sit to Stand: Patient Percentage: 60% Stand to Sit: 1: +2 Total assist;To chair/3-in-1 Stand to Sit: Patient Percentage: 70% Details for Transfer Assistance: increased time Ambulation/Gait Ambulation/Gait Assistance: 1: +2 Total assist Ambulation/Gait: Patient Percentage: 70% Ambulation Distance (Feet): 24 Feet Assistive device: Rolling walker Gait Pattern: Step-to pattern;Decreased step length - right;Decreased step length - left Gait velocity: decreased General Gait Details: HR 108 at rest, 139 with walking    Exercises     PT Diagnosis:    PT Problem List:   PT Treatment Interventions:     PT Goals (current goals can now be found in the care plan section) Acute Rehab PT Goals Patient Stated Goal: Resume previous lifestyle asap PT Goal Formulation: With patient Time For Goal  Achievement: 12/30/12 Potential to Achieve Goals: Good  Visit Information  Last PT Received On: 12/17/12 Assistance Needed: +2 History of Present Illness: Pt is s/p exploratory lap with LOA and hernia repair 12/13/12    Subjective Data  Patient Stated Goal: Resume previous lifestyle asap   Cognition  Cognition Arousal/Alertness: Awake/alert Behavior During Therapy: WFL for tasks assessed/performed Overall Cognitive Status: Within Functional Limits for tasks assessed    Balance     End of Session PT - End of Session Equipment Utilized During Treatment: Gait belt (abdominal binder) Activity Tolerance: Patient limited by fatigue Patient left: with call bell/phone within reach;in chair;with nursing/sitter in room Nurse Communication: Mobility status   GP     Blondell Reveal Kistler 12/17/2012, 12:54 PM [phone]

## 2012-12-17 NOTE — Progress Notes (Signed)
NG discontinued and clear liquids ordered.  Matt B. Hassell Done, MD, [facility] [phone] beeper [phone]  12/17/2012 10:52 AM

## 2012-12-17 NOTE — Progress Notes (Signed)
5 Days Post-Op  Subjective: She walked some and had some flatus, HR seems better controlled.  Objective: Vital signs in last 24 hours: Temp:  [97.6 F (36.4 C)-98.4 F (36.9 C)] 97.7 F (36.5 C) (09/26 0800) Pulse Rate:  [87-112] 102 (09/26 0800) Resp:  [15-36] 27 (09/26 0800) BP: (112-156)/(49-74) 138/65 mmHg (09/26 0800) SpO2:  [89 %-96 %] 96 % (09/26 0800) Weight:  [112.2 kg (247 lb 5.7 oz)] 112.2 kg (247 lb 5.7 oz) (09/26 0400) Last BM Date: 12/09/12 Afebrile, VSS HR still in 100 range. 300 per NG Na 154 K+4.1 Intake/Output from previous day: 09/25 0701 - 09/26 0700 In: 2702.5 [P.O.:40; I.V.:2662.5] Out: 1910 [Urine:1610; Emesis/NG output:300] Intake/Output this shift: Total I/O In: 273 [I.V.:273] Out: -   General appearance: alert, cooperative and no distress GI: soft, tender, Incisions look good, +BS and some flatus  Lab Results:   Recent Labs  12/16/12 0325 12/17/12 0340  WBC 7.8 7.0  HGB 12.8 13.8  HCT 39.2 43.3  PLT 183 182    BMET  Recent Labs  12/16/12 0325 12/17/12 0340  NA 152* 154*  K 3.6 4.1  CL 119* 121*  CO2 25 23  GLUCOSE 167* 159*  BUN 28* 26*  CREATININE 1.51* 1.35*  CALCIUM 9.4 9.1   PT/INR  Recent Labs  12/16/12 0325 12/17/12 0340  LABPROT 17.0* 20.0*  INR 1.42 1.76*    No results found for this basename: AST, ALT, ALKPHOS, BILITOT, PROT, ALBUMIN,  in the last 168 hours   Lipase     Component Value Date/Time   LIPASE 26 12/09/2012 1038     Studies/Results: No results found.  Medications: . amiodarone  200 mg Oral Daily  . diltiazem  90 mg Oral Q6H  . guaiFENesin  600 mg Oral BID  . insulin aspart  0-9 Units Subcutaneous Q4H  . metoprolol  5 mg Intravenous Q12H  . warfarin  3 mg Oral q1800  . warfarin  5 mg Oral ONCE-1800  . Warfarin - Pharmacist Dosing Inpatient   Does not apply q1800    Assessment/Plan 1. Ventral hernia/SBO/incarcerated SB; s/p Laparoscopic lysis of adhesions with laparoscopic repair of  recurrent ventral hernia. 12/13/2012, Madilyn Hook, DO.  Post op ileus 2. Afib on chronic coumadin/amiodarone now with post op RVR 3. Hypertension  5. AODM  6. Chronic renal disease  7. Tobacco use 40 years  8. Dyslipidemia  9.Hx of right breast cancer with lumpectomy and radiation Rx.  10. BMI 38  11. Anemia on FE    Plan:  Clamping trials with NG, sips of clears, d/c foley, Dr. Rockne Menghini is transferring to telem.  Hopefully we can get the NG out soon.  More ambulation now that HR is better.  LOS: 8 days    Deidrick Rainey 12/17/2012

## 2012-12-17 NOTE — Progress Notes (Signed)
ANTICOAGULATION CONSULT NOTE - Follow Up Consult  Pharmacy Consult for Heparin IV, Warfarin Indication: atrial fibrillation  Patient Measurements: Height: 5\' 6"  (167.6 cm) Weight: 247 lb 5.7 oz (112.2 kg) IBW/kg (Calculated) : 59.3 Heparin Dosing Weight: 84 kg  Labs:  Recent Labs  12/15/12 0330 12/15/12 0830 12/16/12 0325 12/17/12 0340  HGB 12.9  --  12.8 13.8  HCT 40.7  --  39.2 43.3  PLT 185  --  183 182  LABPROT 15.0  --  17.0* 20.0*  INR 1.21  --  1.42 1.76*  HEPARINUNFRC  --  0.44 0.42 0.35  CREATININE 1.61*  --  1.51* 1.35*   Assessment: [name] on chronic warfarin therapy PTA for atrial fibrillation presented 9/18 with ventral hernia/SBO/incarcerated SB.  Warfarin was placed on hold (last dose given 9/18) and heparin bridge started 9/19. Underwent lap ventral hernia repair on 12/12/12.  Heparin resumed 8 hours post-op. Warfarin resumed 9/23.  Warfarin dose PTA reported as 5 mg daily.  Heparin level in therapeutic range on 1150 units/hr.  INR rising nicely, almost in target range. Coumadin dose not charted on 9/25. Confirmed with RN it was not given.  CBC wnl. No bleeding reported/documented.  Concurrent amiodarone.  Remains NPO except meds.   Goal of Therapy:  INR 2-3 Heparin level 0.3-0.7 units/ml Monitor platelets by anticoagulation protocol: Yes   Plan:   Give Coumadin 3mg  today. Will be sensitive to Coumadin until able to eat.  Continue IV heparin at 1150 units/hr (11.5 mL/hr)  Daily INR, heparin level, and CBC.  Continue to monitor platelets and for bleeding.  Romeo Rabon, PharmD, pager [phone]. 12/17/2012,7:52 AM.

## 2012-12-17 NOTE — Progress Notes (Signed)
At around 1700, pt still had not voided since removing foley at 10am per ICU RN.  Bladder scanned pt and the most shown was 266ml.  Pt also felt nauseous and spitting up mucuos.  Paged MD - received order to I&O cath q6 hrs PRN.  I&O cathed patient at 1845 - 549ml output.  Gave 1800 scheduled meds at 1900 b/c of being nauseous. Pt stated she felt better. Approximately 63minutes after giving meds, she vomited brown emesis.  Currently, she is resting comfortably & verbally she is not nauseated.

## 2012-12-17 NOTE — Progress Notes (Signed)
Inpatient Diabetes Program Recommendations  AACE/ADA: New Consensus Statement on Inpatient Glycemic Control (2013)  Target Ranges:  Prepandial:   less than 140 mg/dL      Peak postprandial:   less than 180 mg/dL (1-2 hours)      Critically ill patients:  140 - 180 mg/dL   Reason for Visit: Hyperglycemia  79 y.o. female with a PMH of diabetes, hypertension, chronic kidney disease, dyslipidemia, and atrial fibrillation who was admitted on 12/09/2012 with a small bowel obstruction and incarcerated ventral hernia who underwent exploratory laparoscopy with lysis of adhesions and repair of recurrent ventral hernia by Dr. Lilyan Punt on 12/13/2012. Her postoperative course has been complicated by atrial fibrillation with rapid ventricular response necessitating transfer to the step down unit on 12/14/2012.   Results for CHELBIE, BILLIE (MRN [id number]) as of 12/17/2012 11:40  Ref. Range 12/09/2012 15:30  Hemoglobin A1C Latest Range: <5.7 % 8.1 (H)  Results for [name], [name] (MRN [id number]) as of 12/17/2012 11:40  Ref. Range 12/16/2012 08:47 12/16/2012 11:51 12/16/2012 16:03 12/16/2012 20:01 12/17/2012 00:21 12/17/2012 03:49 12/17/2012 08:00  Glucose-Capillary Latest Range: 70-99 mg/dL 205 (H) 162 (H) 158 (H) 138 (H) 130 (H) 142 (H) 183 (H)   HgbA1C indicative of uncontrolled blood sugars at home. On Glipizide 10 mg and Januvia 25 mg PTA. On Novolog sensitive Q4 hours.  Blood sugars controlled. Will need f/u with PCP to recheck HgbA1C and manage DM.  Thank you. Lorenda Peck, RD, LDN, CDE Inpatient Diabetes Coordinator [phone]

## 2012-12-18 ENCOUNTER — Inpatient Hospital Stay (HOSPITAL_COMMUNITY): Payer: Medicare Other

## 2012-12-18 LAB — CBC
HCT: 41.2 % (ref 36.0–46.0)
Hemoglobin: 12.8 g/dL (ref 12.0–15.0)
MCH: 25.4 pg — ABNORMAL LOW (ref 26.0–34.0)
MCHC: 31.1 g/dL (ref 30.0–36.0)
MCV: 81.9 fL (ref 78.0–100.0)
Platelets: 204 10*3/uL (ref 150–400)
RBC: 5.03 MIL/uL (ref 3.87–5.11)

## 2012-12-18 LAB — BASIC METABOLIC PANEL
BUN: 24 mg/dL — ABNORMAL HIGH (ref 6–23)
CO2: 21 mEq/L (ref 19–32)
Calcium: 8.6 mg/dL (ref 8.4–10.5)
Chloride: 118 mEq/L — ABNORMAL HIGH (ref 96–112)
Creatinine, Ser: 1.45 mg/dL — ABNORMAL HIGH (ref 0.50–1.10)
GFR calc Af Amer: 39 mL/min — ABNORMAL LOW (ref >90)
Glucose, Bld: 104 mg/dL — ABNORMAL HIGH (ref 70–99)

## 2012-12-18 LAB — GLUCOSE, CAPILLARY
Glucose-Capillary: 113 mg/dL — ABNORMAL HIGH (ref 70–99)
Glucose-Capillary: 133 mg/dL — ABNORMAL HIGH (ref 70–99)
Glucose-Capillary: 163 mg/dL — ABNORMAL HIGH (ref 70–99)
Glucose-Capillary: 164 mg/dL — ABNORMAL HIGH (ref 70–99)
Glucose-Capillary: 184 mg/dL — ABNORMAL HIGH (ref 70–99)
Glucose-Capillary: 72 mg/dL (ref 70–99)

## 2012-12-18 LAB — HEPARIN LEVEL (UNFRACTIONATED): Heparin Unfractionated: 0.36 IU/mL (ref 0.30–0.70)

## 2012-12-18 LAB — PRO B NATRIURETIC PEPTIDE: Pro B Natriuretic peptide (BNP): 3788 pg/mL — ABNORMAL HIGH (ref 0–450)

## 2012-12-18 LAB — PROTIME-INR: Prothrombin Time: 24.6 seconds — ABNORMAL HIGH (ref 11.6–15.2)

## 2012-12-18 LAB — TROPONIN I: Troponin I: <0.3 ng/mL (ref <0.30)

## 2012-12-18 MED ORDER — FUROSEMIDE 10 MG/ML IJ SOLN
40.0000 mg | Freq: Once | INTRAMUSCULAR | Status: AC
  Administered 2012-12-18: 40 mg via INTRAVENOUS

## 2012-12-18 NOTE — Progress Notes (Signed)
[facility] PROGRESS NOTE  [name] [id number] DOB: [birth year] DOA: 12/09/2012 PCP: Milagros Evener, MD  Brief narrative: [name] is an 79 y.o. female with a PMH of diabetes, hypertension, chronic kidney disease, dyslipidemia, and atrial fibrillation who was admitted on 12/09/2012 with a small bowel obstruction and incarcerated ventral hernia who underwent exploratory laparoscopy with lysis of adhesions and repair of recurrent ventral hernia by Dr. Lilyan Punt on 12/13/2012. Her postoperative course has been complicated by atrial fibrillation with rapid ventricular response necessitating transfer to the step down unit on 12/14/2012.  Assessment/Plan: Principal Problem:   SBO (small bowel obstruction) secondary to incarcerated umbilical hernia -Status post exploratory laparoscopy with lysis of adhesions and repair of recurrent ventral hernia 12/13/2012. -Reports having had some flatus, plan to clamp NG tube and if tolerates, start some clear liquids. Active Problems:   Chest tightness -Will check pro BNP and cycle troponins. Check 12-lead EKG. -Give lasix 40 mg IV x 1.   Hypernatremia -IVF KVO'd secondary to chest tightness. -Taking in oral liquids.  Should respond to thirst.   ? Chronic diastolic CHF -BNP [id number] on 12/13/2012.  Repeat. -2-D echocardiogram done 12/15/2012 with EF 60-65% and no regional wall motion abnormalities or mention of diastolic dysfunction.   HTN (hypertension) -Controlled on metoprolol, amiodarone and Cardizem.   Dyslipidemia -Resume statin when nausea improved.   Atrial fibrillation -Continue amiodarone, metoprolol and Cardizem drip. -Heparin/Coumadin resumed 12/14/2012. -Seen by cardiology 12/13/2012.   Diabetes -Hemoglobin 8.1%. Seen by diabetes coordinator. -Controlled on SSI Q 4 hours while n.p.o. -CBG is 72-184.   CKD (chronic kidney disease), stage III -Creatinine appears to be better than usual baseline. Creatinine was 1.7 in 2012.  Code  Status: Full. Family Communication: Husband updated at the bedside. Disposition Plan: Home versus SNF when stable.   Medical Consultants:  Dr. Madilyn Hook, Surgery  Dr. Daneen Schick III, Cardiology  Other Consultants:  Physical therapy  Diabetes coordinator  Anti-infectives:  None.  HPI/Subjective: [name] now has the NG tube out and is taking in small sips of liquids. She had some nausea and vomiting last evening but no vomiting since. She reports passing flatus. Complains of some chest tightness and discomfort, rated level 5/10. Nonexertional.  Objective: Filed Vitals:   12/17/12 1327 12/17/12 2110 12/18/12 0403 12/18/12 1426  BP: 137/77 131/70 123/80 146/65  Pulse: 103 115 107 100  Temp: 97.7 F (36.5 C) 98.2 F (36.8 C) 98.1 F (36.7 C) 98 F (36.7 C)  TempSrc: Oral Oral Oral Oral  Resp: 22 26 22 20   Height:      Weight:      SpO2: 98% 94% 95% 96%    Intake/Output Summary (Last 24 hours) at 12/18/12 1522 Last data filed at 12/18/12 0759  Gross per 24 hour  Intake 2846.57 ml  Output    900 ml  Net 1946.57 ml    Exam: Gen:  NAD Cardiovascular:  Tachycardic, irregular. Respiratory:  Lungs CTAB Gastrointestinal:  Abdomen soft, NT/ND, + BS Extremities:  No C/E/C  Data Reviewed: Basic Metabolic Panel:  Recent Labs Lab 12/15/12 0330 12/16/12 0325 12/17/12 0340 12/18/12 0620  NA 151* 152* 154* 150*  K 3.5 3.6 4.1 4.0  CL 116* 119* 121* 118*  CO2 23 25 23 21   GLUCOSE 151* 167* 159* 104*  BUN 25* 28* 26* 24*  CREATININE 1.61* 1.51* 1.35* 1.45*  CALCIUM 9.7 9.4 9.1 8.6   GFR Estimated Creatinine Clearance: 41.3 ml/min (by C-G formula based on Cr of  1.45).  Coagulation profile  Recent Labs Lab 12/14/12 0433 12/15/12 0330 12/16/12 0325 12/17/12 0340 12/18/12 0620  INR 1.21 1.21 1.42 1.76* 2.31*    CBC:  Recent Labs Lab 12/14/12 0433 12/15/12 0330 12/16/12 0325 12/17/12 0340 12/18/12 0620  WBC 7.4 8.6 7.8 7.0 6.1  HGB  13.1 12.9 12.8 13.8 12.8  HCT 40.6 40.7 39.2 43.3 41.2  MCV 81.2 81.6 81.0 81.2 81.9  PLT 165 185 183 182 204   BNP (last 3 results)  Recent Labs  12/13/12 0650  PROBNP 1325.0*   CBG:  Recent Labs Lab 12/17/12 2022 12/18/12 0001 12/18/12 0401 12/18/12 0737 12/18/12 1152  GLUCAP 161* 133* 27 113* 184*   Microbiology Recent Results (from the past 240 hour(s))  CULTURE, BLOOD (ROUTINE X 2)     Status: None   Collection Time    12/13/12 12:12 PM      Result Value Range Status   Specimen Description BLOOD LEFT HAND   Final   Special Requests BOTTLES DRAWN AEROBIC AND ANAEROBIC 6CC   Final   Culture  Setup Time     Final   Value: 12/13/2012 15:44     Performed at [facility]   Culture     Final   Value:        BLOOD CULTURE RECEIVED NO GROWTH TO DATE CULTURE WILL BE HELD FOR 5 DAYS BEFORE ISSUING A FINAL NEGATIVE REPORT     Performed at [facility]   Report Status PENDING   Incomplete  CULTURE, BLOOD (ROUTINE X 2)     Status: None   Collection Time    12/13/12 12:20 PM      Result Value Range Status   Specimen Description BLOOD LEFT HAND   Final   Special Requests BOTTLES DRAWN AEROBIC AND ANAEROBIC 4CC   Final   Culture  Setup Time     Final   Value: 12/13/2012 15:44     Performed at [facility]   Culture     Final   Value:        BLOOD CULTURE RECEIVED NO GROWTH TO DATE CULTURE WILL BE HELD FOR 5 DAYS BEFORE ISSUING A FINAL NEGATIVE REPORT     Performed at [facility]   Report Status PENDING   Incomplete  MRSA PCR SCREENING     Status: None   Collection Time    12/14/12  4:04 PM      Result Value Range Status   MRSA by PCR NEGATIVE  NEGATIVE Final   Comment:            The GeneXpert MRSA Assay (FDA     approved for NASAL specimens     only), is one component of a     comprehensive MRSA colonization     surveillance program. It is not     intended to diagnose MRSA     infection nor to guide or     monitor treatment for      MRSA infections.     Procedures and Diagnostic Studies: Ct Abdomen Pelvis Wo Contrast  12/09/2012   *RADIOLOGY REPORT*  Clinical Data: Lower pelvic pain  CT ABDOMEN AND PELVIS WITHOUT CONTRAST  Technique:  Multidetector CT imaging of the abdomen and pelvis was performed following the standard protocol without intravenous contrast.  Comparison: None.  Findings: Visualized lung bases appear normal.  Hepatic cyst is noted inferiorly in right hepatic lobe.  The spleen and pancreas appear normal.  No  gallstones are noted.  Adrenal glands and kidneys appear normal.  No renal or ureteral calculi are noted. Urinary bladder appears normal.  Calcified fibroids are seen involving the uterus.  Phleboliths are noted in the pelvis. Sigmoid diverticulosis is noted without inflammation.  There is noted a ventral hernia in the right lower quadrant of the abdomen which contains a loop of incarcerated terminal ileum and results in partial obstruction of the more proximal small bowel.  Wall thickening and inflammatory changes are seen involving the more distal terminal ileum extending to the ileocecal valve.  No abnormal fluid collection is noted.  Contrast is noted within the colon as well as stool.  IMPRESSION: Ventral hernia is noted in the right lower quadrant of the anterior abdominal wall which contains a loop of incarcerated terminal ileum, resulting in partial small bowel obstruction and dilatation of the more proximal small bowel.   Original Report Authenticated By: Marijo Conception.,  M.D.   Dg Abd 2 Views  12/10/2012   CLINICAL DATA:  Evaluate for small bowel obstruction. Periumbilical hernia with prior surgery. Now with protruding hernia right abdominal region and occasional constipation.  EXAM: ABDOMEN - 2 VIEW  COMPARISON:  12/09/2012 CT.  FINDINGS: Small bowel obstructive pattern with contrast seen in portions of the colon. Gas distended small bowel loops measuring up to 5 cm with thickened folds.  No free  intraperitoneal air detected.  IMPRESSION: Small bowel obstructive pattern with contrast seen in portions of the colon. Gas distended small bowel loops measuring up to 5 cm with thickened folds. No free intraperitoneal air detected.   Electronically Signed   By: Chauncey Cruel   On: 12/10/2012 10:36   Dg Abd Acute W/chest  12/09/2012   CLINICAL DATA:  Abdominal pain, hypertension.  EXAM: ACUTE ABDOMEN SERIES (ABDOMEN 2 VIEW & CHEST 1 VIEW)  COMPARISON:  07/27/2012  FINDINGS: Dilated small bowel loops with scattered air-fluid levels in the abdomen. Findings concerning for small bowel obstruction. Calcifications in the center of the pelvis, likely calcified fibroids. No organomegaly. No free air.  IMPRESSION: Dilated small bowel loops with scattered air-fluid levels concerning for small bowel obstruction.   Electronically Signed   By: Rolm Baptise M.D.   On: 12/09/2012 12:22   Dg Abd Portable 1v  12/10/2012   *RADIOLOGY REPORT*  Clinical Data: NG tube placement.  PORTABLE ABDOMEN - 1 VIEW 5:49 p.m.  Comparison: 12/10/2012 at 9:24 a.m.  Findings: NG tube has been inserted and the tip is in the proximal stomach just below the gastroesophageal junction.  There is air throughout the large and small bowel including slightly distended loops of small bowel in the lower abdomen.  Degenerative changes are present throughout the spine.  IMPRESSION: NG tube tip is in the upper stomach just beyond the gastroesophageal junction.  Persistent slight distention of small bowel.  Contrast has passed into the colon.   Original Report Authenticated By: Lorriane Shire, M.D.   2-D echocardiogram  12/15/2012  Study Conclusions  - Left ventricle: The cavity size was normal. Systolic function was normal. The estimated ejection fraction was in the range of 60% to 65%. Although no diagnostic regional wall motion abnormality was identified, this possibility cannot be completely excluded on the basis of this study. - Aortic valve:  Mild regurgitation. - Mitral valve: Calcified annulus. Mildly thickened leaflets . Mild regurgitation. - Left atrium: The atrium was mildly dilated. - Pulmonary arteries: Systolic pressure was moderately increased. PA peak pressure: 10mm Hg (S).  Scheduled  Meds: . amiodarone  200 mg Oral Daily  . diltiazem  90 mg Oral Q6H  . furosemide  40 mg Intravenous Once  . guaiFENesin  600 mg Oral BID  . insulin aspart  0-9 Units Subcutaneous Q4H  . metoprolol  5 mg Intravenous Q12H  . Warfarin - Pharmacist Dosing Inpatient   Does not apply q1800   Continuous Infusions: . 0.45 % NaCl with KCl 20 mEq / L 125 mL/hr at 12/18/12 1306  . diltiazem (CARDIZEM) infusion Stopped (12/17/12 0819)    Time spent: 35 minutes with > 50% of time discussing diagnostic tests to be ordered, clinical impression and plan of care.    LOS: 9 days   [facility] Pager [phone].   *Please note that the hospitalists switch teams on Wednesdays. Please call the flow manager at [phone] if you are having difficulty reaching the hospitalist taking care of this patient as she can update you and provide the most up-to-date pager number of provider caring for the patient. If 8PM-8AM, please contact night-coverage at [email or web address], password [facility]  12/18/2012, 3:22 PM

## 2012-12-18 NOTE — Progress Notes (Signed)
ANTICOAGULATION CONSULT NOTE - Follow Up Consult  Pharmacy Consult for Heparin IV, Warfarin Indication: atrial fibrillation  Patient Measurements: Height: 5\' 6"  (167.6 cm) Weight: 247 lb 5.7 oz (112.2 kg) IBW/kg (Calculated) : 59.3 Heparin Dosing Weight: 84 kg  Labs:  Recent Labs  12/16/12 0325 12/17/12 0340 12/18/12 0620  HGB 12.8 13.8 12.8  HCT 39.2 43.3 41.2  PLT 183 182 204  LABPROT 17.0* 20.0* 24.6*  INR 1.42 1.76* 2.31*  HEPARINUNFRC 0.42 0.35 0.36  CREATININE 1.51* 1.35* 1.45*   Assessment: [name] on chronic warfarin therapy PTA for atrial fibrillation presented 9/18 with ventral hernia/SBO/incarcerated SB.  Warfarin was placed on hold (last dose given 9/18) and heparin bridge started 9/19. Underwent lap ventral hernia repair on 12/12/12.  Heparin resumed 8 hours post-op. Warfarin resumed 9/23.  Warfarin dose PTA reported as 5 mg daily.  Heparin level in therapeutic range on 1150 units/hr.  INR now in therapeutic range and still rising after smaller dose yesterday.   CBC wnl. No bleeding reported/documented.  Concurrent amiodarone.  Started CL yesterday. Some N/V last evening. Vomited shortly after meds given, including Coumadin.  Goal of Therapy:  INR 2-3 Heparin level 0.3-0.7 units/ml Monitor platelets by anticoagulation protocol: Yes   Plan:   No Coumadin today.  DC heparin.   Daily INR.  Romeo Rabon, PharmD, pager [phone]. 12/18/2012,7:40 AM.

## 2012-12-18 NOTE — Progress Notes (Signed)
Patient ID: [name], female   DOB: Oct 11, 1933, 79 y.o.   MRN: [id number] [facility] Progress Note:   6 Days Post-Op  Subjective: Mental status is more clear.  Transferred yesterday to the floor.   Objective: Vital signs in last 24 hours: Temp:  [97.7 F (36.5 C)-98.2 F (36.8 C)] 98.1 F (36.7 C) (09/27 0403) Pulse Rate:  [94-139] 107 (09/27 0403) Resp:  [22-27] 22 (09/27 0403) BP: (123-138)/(70-80) 123/80 mmHg (09/27 0403) SpO2:  [94 %-98 %] 95 % (09/27 0403)  Intake/Output from previous day: 09/26 0701 - 09/27 0700 In: 3389.1 [P.O.:120; I.V.:3169.1; NG/GT:100] Out: 1060 [Urine:1010; Emesis/NG output:50] Intake/Output this shift:    Physical Exam: Work of breathing is not labored.  Incisions are OK.  Passing flatus.   Lab Results:  Results for orders placed during the hospital encounter of 12/09/12 (from the past 48 hour(s))  GLUCOSE, CAPILLARY     Status: Abnormal   Collection Time    12/16/12 11:51 AM      Result Value Range   Glucose-Capillary 162 (*) 70 - 99 mg/dL  GLUCOSE, CAPILLARY     Status: Abnormal   Collection Time    12/16/12  4:03 PM      Result Value Range   Glucose-Capillary 158 (*) 70 - 99 mg/dL   Comment 1 Documented in Chart     Comment 2 Notify RN    GLUCOSE, CAPILLARY     Status: Abnormal   Collection Time    12/16/12  8:01 PM      Result Value Range   Glucose-Capillary 138 (*) 70 - 99 mg/dL  GLUCOSE, CAPILLARY     Status: Abnormal   Collection Time    12/17/12 12:21 AM      Result Value Range   Glucose-Capillary 130 (*) 70 - 99 mg/dL  PROTIME-INR     Status: Abnormal   Collection Time    12/17/12  3:40 AM      Result Value Range   Prothrombin Time 20.0 (*) 11.6 - 15.2 seconds   INR 1.76 (*) 0.00 - 1.49  CBC     Status: Abnormal   Collection Time    12/17/12  3:40 AM      Result Value Range   WBC 7.0  4.0 - 10.5 K/uL   RBC 5.33 (*) 3.87 - 5.11 MIL/uL   Hemoglobin 13.8  12.0 - 15.0 g/dL   HCT 43.3  36.0 - 46.0 %   MCV 81.2  78.0 - 100.0 fL   MCH 25.9 (*) 26.0 - 34.0 pg   MCHC 31.9  30.0 - 36.0 g/dL   RDW 15.0  11.5 - 15.5 %   Platelets 182  150 - 400 K/uL  HEPARIN LEVEL (UNFRACTIONATED)     Status: None   Collection Time    12/17/12  3:40 AM      Result Value Range   Heparin Unfractionated 0.35  0.30 - 0.70 IU/mL   Comment:            IF HEPARIN RESULTS ARE BELOW     EXPECTED VALUES, AND PATIENT     DOSAGE HAS BEEN CONFIRMED,     SUGGEST FOLLOW UP TESTING     OF ANTITHROMBIN III LEVELS.  BASIC METABOLIC PANEL     Status: Abnormal   Collection Time    12/17/12  3:40 AM      Result Value Range   Sodium 154 (*) 135 - 145 mEq/L   Potassium 4.1  3.5 - 5.1 mEq/L   Chloride 121 (*) 96 - 112 mEq/L   CO2 23  19 - 32 mEq/L   Glucose, Bld 159 (*) 70 - 99 mg/dL   BUN 26 (*) 6 - 23 mg/dL   Creatinine, Ser 1.35 (*) 0.50 - 1.10 mg/dL   Calcium 9.1  8.4 - 10.5 mg/dL   GFR calc non Af Amer 37 (*) >90 mL/min   GFR calc Af Amer 43 (*) >90 mL/min   Comment: (NOTE)     The eGFR has been calculated using the CKD EPI equation.     This calculation has not been validated in all clinical situations.     eGFR's persistently <90 mL/min signify possible Chronic Kidney     Disease.  GLUCOSE, CAPILLARY     Status: Abnormal   Collection Time    12/17/12  3:49 AM      Result Value Range   Glucose-Capillary 142 (*) 70 - 99 mg/dL   Comment 1 Documented in Chart     Comment 2 Notify RN    GLUCOSE, CAPILLARY     Status: Abnormal   Collection Time    12/17/12  8:00 AM      Result Value Range   Glucose-Capillary 183 (*) 70 - 99 mg/dL   Comment 1 Notify RN     Comment 2 Documented in Chart    GLUCOSE, CAPILLARY     Status: Abnormal   Collection Time    12/17/12 11:49 AM      Result Value Range   Glucose-Capillary 156 (*) 70 - 99 mg/dL  GLUCOSE, CAPILLARY     Status: Abnormal   Collection Time    12/17/12  4:45 PM      Result Value Range   Glucose-Capillary 159 (*) 70 - 99 mg/dL   Comment 1 Documented in  Chart     Comment 2 Notify RN    GLUCOSE, CAPILLARY     Status: Abnormal   Collection Time    12/17/12  8:22 PM      Result Value Range   Glucose-Capillary 161 (*) 70 - 99 mg/dL  GLUCOSE, CAPILLARY     Status: Abnormal   Collection Time    12/18/12 12:01 AM      Result Value Range   Glucose-Capillary 133 (*) 70 - 99 mg/dL  GLUCOSE, CAPILLARY     Status: None   Collection Time    12/18/12  4:01 AM      Result Value Range   Glucose-Capillary 72  70 - 99 mg/dL  PROTIME-INR     Status: Abnormal   Collection Time    12/18/12  6:20 AM      Result Value Range   Prothrombin Time 24.6 (*) 11.6 - 15.2 seconds   INR 2.31 (*) 0.00 - 1.49  CBC     Status: Abnormal   Collection Time    12/18/12  6:20 AM      Result Value Range   WBC 6.1  4.0 - 10.5 K/uL   RBC 5.03  3.87 - 5.11 MIL/uL   Hemoglobin 12.8  12.0 - 15.0 g/dL   HCT 41.2  36.0 - 46.0 %   MCV 81.9  78.0 - 100.0 fL   MCH 25.4 (*) 26.0 - 34.0 pg   MCHC 31.1  30.0 - 36.0 g/dL   RDW 15.2  11.5 - 15.5 %   Platelets 204  150 - 400 K/uL  HEPARIN LEVEL (UNFRACTIONATED)  Status: None   Collection Time    12/18/12  6:20 AM      Result Value Range   Heparin Unfractionated 0.36  0.30 - 0.70 IU/mL   Comment:            IF HEPARIN RESULTS ARE BELOW     EXPECTED VALUES, AND PATIENT     DOSAGE HAS BEEN CONFIRMED,     SUGGEST FOLLOW UP TESTING     OF ANTITHROMBIN III LEVELS.  BASIC METABOLIC PANEL     Status: Abnormal   Collection Time    12/18/12  6:20 AM      Result Value Range   Sodium 150 (*) 135 - 145 mEq/L   Potassium 4.0  3.5 - 5.1 mEq/L   Chloride 118 (*) 96 - 112 mEq/L   CO2 21  19 - 32 mEq/L   Glucose, Bld 104 (*) 70 - 99 mg/dL   BUN 24 (*) 6 - 23 mg/dL   Creatinine, Ser 1.45 (*) 0.50 - 1.10 mg/dL   Calcium 8.6  8.4 - 10.5 mg/dL   GFR calc non Af Amer 34 (*) >90 mL/min   GFR calc Af Amer 39 (*) >90 mL/min   Comment: (NOTE)     The eGFR has been calculated using the CKD EPI equation.     This calculation has not  been validated in all clinical situations.     eGFR's persistently <90 mL/min signify possible Chronic Kidney     Disease.  GLUCOSE, CAPILLARY     Status: Abnormal   Collection Time    12/18/12  7:37 AM      Result Value Range   Glucose-Capillary 113 (*) 70 - 99 mg/dL    Radiology/Results: No results found.  Anti-infectives: Anti-infectives   Start     Dose/Rate Route Frequency Ordered Stop   12/12/12 1800  ceFAZolin (ANCEF) IVPB 2 g/50 mL premix     2 g 100 mL/hr over 30 Minutes Intravenous  Once 12/12/12 1638 12/12/12 1900   12/12/12 1126  ceFAZolin (ANCEF) IVPB 2 g/50 mL premix     2 g 100 mL/hr over 30 Minutes Intravenous 30 min pre-op 12/12/12 1122 12/12/12 1140      Assessment/Plan: Problem List: Patient Active Problem List   Diagnosis Date Noted  . Ileus, postoperative 12/17/2012  . Umbilical hernia, incarcerated - reducible 12/10/2012  . SBO (small bowel obstruction) 12/09/2012  . HTN (hypertension) 12/09/2012  . Dyslipidemia 12/09/2012  . Atrial fibrillation 12/09/2012  . Diabetes 12/09/2012  . CKD (chronic kidney disease), stage III 12/09/2012   Will advance to full liquid diet.  Continue observation 6 Days Post-Op    LOS: 9 days   Matt B. Hassell Done, MD, [facility] [phone] beeper [phone]  12/18/2012 9:27 AM

## 2012-12-19 LAB — GLUCOSE, CAPILLARY
Glucose-Capillary: 130 mg/dL — ABNORMAL HIGH (ref 70–99)
Glucose-Capillary: 144 mg/dL — ABNORMAL HIGH (ref 70–99)
Glucose-Capillary: 191 mg/dL — ABNORMAL HIGH (ref 70–99)

## 2012-12-19 LAB — CBC
MCH: 26.4 pg (ref 26.0–34.0)
MCV: 81.7 fL (ref 78.0–100.0)
Platelets: 211 10*3/uL (ref 150–400)
RBC: 5.08 MIL/uL (ref 3.87–5.11)
RDW: 15.2 % (ref 11.5–15.5)

## 2012-12-19 LAB — CULTURE, BLOOD (ROUTINE X 2)
Culture: NO GROWTH
Culture: NO GROWTH

## 2012-12-19 LAB — BASIC METABOLIC PANEL
Calcium: 8.3 mg/dL — ABNORMAL LOW (ref 8.4–10.5)
Chloride: 115 mEq/L — ABNORMAL HIGH (ref 96–112)
Creatinine, Ser: 1.52 mg/dL — ABNORMAL HIGH (ref 0.50–1.10)
GFR calc Af Amer: 37 mL/min — ABNORMAL LOW (ref >90)
GFR calc non Af Amer: 32 mL/min — ABNORMAL LOW (ref >90)
Potassium: 3.8 mEq/L (ref 3.5–5.1)
Sodium: 147 mEq/L — ABNORMAL HIGH (ref 135–145)

## 2012-12-19 LAB — TROPONIN I: Troponin I: <0.3 ng/mL (ref <0.30)

## 2012-12-19 LAB — PROTIME-INR
INR: 2.48 — ABNORMAL HIGH (ref 0.00–1.49)
Prothrombin Time: 26 seconds — ABNORMAL HIGH (ref 11.6–15.2)

## 2012-12-19 MED ORDER — DILTIAZEM HCL ER COATED BEADS 360 MG PO CP24
360.0000 mg | ORAL_CAPSULE | Freq: Every day | ORAL | Status: DC
  Administered 2012-12-19 – 2012-12-21 (×3): 360 mg via ORAL
  Filled 2012-12-19 (×3): qty 1

## 2012-12-19 MED ORDER — INSULIN ASPART 100 UNIT/ML ~~LOC~~ SOLN
0.0000 [IU] | Freq: Three times a day (TID) | SUBCUTANEOUS | Status: DC
  Administered 2012-12-19 – 2012-12-20 (×3): 2 [IU] via SUBCUTANEOUS
  Administered 2012-12-20: 1 [IU] via SUBCUTANEOUS
  Administered 2012-12-21: 2 [IU] via SUBCUTANEOUS
  Administered 2012-12-21: 3 [IU] via SUBCUTANEOUS

## 2012-12-19 MED ORDER — INSULIN ASPART 100 UNIT/ML ~~LOC~~ SOLN
0.0000 [IU] | Freq: Every day | SUBCUTANEOUS | Status: DC
  Administered 2012-12-19: 2 [IU] via SUBCUTANEOUS

## 2012-12-19 MED ORDER — METOPROLOL SUCCINATE 12.5 MG HALF TABLET
12.5000 mg | ORAL_TABLET | Freq: Two times a day (BID) | ORAL | Status: DC
  Administered 2012-12-19 – 2012-12-21 (×4): 12.5 mg via ORAL
  Filled 2012-12-19 (×5): qty 1

## 2012-12-19 MED ORDER — ALUM & MAG HYDROXIDE-SIMETH 200-200-20 MG/5ML PO SUSP
30.0000 mL | ORAL | Status: DC | PRN
  Administered 2012-12-19 – 2012-12-20 (×2): 30 mL via ORAL
  Filled 2012-12-19 (×2): qty 30

## 2012-12-19 MED ORDER — METOPROLOL SUCCINATE 12.5 MG HALF TABLET
12.5000 mg | ORAL_TABLET | Freq: Every day | ORAL | Status: DC
  Administered 2012-12-19: 12.5 mg via ORAL
  Filled 2012-12-19: qty 1

## 2012-12-19 MED ORDER — WARFARIN SODIUM 4 MG PO TABS
4.0000 mg | ORAL_TABLET | Freq: Once | ORAL | Status: AC
  Administered 2012-12-19: 4 mg via ORAL
  Filled 2012-12-19: qty 1

## 2012-12-19 MED ORDER — ALUM HYDROXIDE-MAG CARBONATE 95-358 MG/15ML PO SUSP
30.0000 mL | ORAL | Status: DC | PRN
  Filled 2012-12-19: qty 30

## 2012-12-19 NOTE — Progress Notes (Signed)
ANTICOAGULATION CONSULT NOTE - Follow Up Consult  Pharmacy Consult for: Warfarin Indication: atrial fibrillation  Patient Measurements: Height: 5\' 6"  (167.6 cm) Weight: 250 lb 3.6 oz (113.5 kg) IBW/kg (Calculated) : 59.3 Heparin Dosing Weight: 84 kg  Labs:  Recent Labs  12/17/12 0340 12/18/12 0620 12/18/12 1527 12/18/12 2101 12/19/12 0320  HGB 13.8 12.8  --   --  13.4  HCT 43.3 41.2  --   --  41.5  PLT 182 204  --   --  211  LABPROT 20.0* 24.6*  --   --  26.0*  INR 1.76* 2.31*  --   --  2.48*  HEPARINUNFRC 0.35 0.36  --   --   --   CREATININE 1.35* 1.45*  --   --  1.52*  TROPONINI  --   --  <0.30 <0.30 <0.30   Assessment: [name] on chronic warfarin therapy PTA for atrial fibrillation presented 9/18 with ventral hernia/SBO/incarcerated SB.  Warfarin was placed on hold (last dose given 9/18) and heparin bridge started 9/19. Underwent lap ventral hernia repair on 12/12/12.  Heparin resumed 8 hours post-op.  Warfarin resumed 9/23.  Warfarin dose PTA reported as 5 mg daily.  Heparin discontinued 9/27 as INR had reached goal  INR today remains within goal range with slight trend up despite holding warfarin dose last night - Carb modified diet started today with 70% first meal charted, anticipate INR trend to slow down.   CBC okay  No bleeding/issues reported   Goal of Therapy:  INR 2-3 Heparin level 0.3-0.7 units/ml Monitor platelets by anticoagulation protocol: Yes   Plan:   Warfarin 4 mg   Daily INR.  BorgerdingGaye Alken PharmD Pager #: [phone] 11:33 AM 12/19/2012

## 2012-12-19 NOTE — Progress Notes (Signed)
[facility] PROGRESS NOTE  [name] [id number] DOB: 1933/09/06 DOA: 12/09/2012 PCP: Milagros Evener, MD  Brief narrative: [name] is an 79 y.o. female with a PMH of diabetes, hypertension, chronic kidney disease, dyslipidemia, and atrial fibrillation who was admitted on 12/09/2012 with a small bowel obstruction and incarcerated ventral hernia who underwent exploratory laparoscopy with lysis of adhesions and repair of recurrent ventral hernia by Dr. Lilyan Punt on 12/13/2012. Her postoperative course has been complicated by atrial fibrillation with rapid ventricular response necessitating transfer to the step down unit on 12/14/2012.  Assessment/Plan: Principal Problem:   SBO (small bowel obstruction) secondary to incarcerated umbilical hernia -Status post exploratory laparoscopy with lysis of adhesions and repair of recurrent ventral hernia 12/13/2012. -NG tube removed 12/18/2012.  Diet advanced to carb modified today by surgeon. Active Problems:   Chest tightness -Pro BNP elevated at 3788. Troponins negative x3. 12-lead EKG results not currently available. -Given lasix 40 mg IV x 1 12/18/2012 with alleviation of symptoms.   Hypernatremia -IVF KVO'd secondary to chest tightness. -Taking in oral liquids.  Sodium coming down.   Chest tightness / ? Chronic diastolic CHF -Pro BNP elevated at 3788. Troponins negative x3. 12-lead EKG showed afib with RVR but no acute changes. -Given lasix 40 mg IV x 1 12/18/2012. -2-D echocardiogram done 12/15/2012 with EF 60-65% and no regional wall motion abnormalities or mention of diastolic dysfunction.   HTN (hypertension) -Controlled on metoprolol, amiodarone and Cardizem.   Dyslipidemia -Resume statin when nausea improved.   Atrial fibrillation -Continue amiodarone, metoprolol and Cardizem.  Increase metoprolol. -Heparin/Coumadin resumed 12/14/2012. -Seen by cardiology 12/13/2012.   Diabetes -Hemoglobin 8.1%. Seen by diabetes  coordinator. -Controlled on SSI Q 4 hours while n.p.o. -CBG is 130-164.   CKD (chronic kidney disease), stage III -Creatinine appears to be better than usual baseline. Creatinine was 1.7 in 2012.  Code Status: Full. Family Communication: Husband updated at the bedside. Disposition Plan: Home versus SNF when stable.   Medical Consultants:  Dr. Madilyn Hook, Surgery  Dr. Daneen Schick III, Cardiology  Other Consultants:  Physical therapy  Diabetes coordinator  Anti-infectives:  None.  HPI/Subjective: [name] is still having some issues with nausea, but is starting to eat solid foods in small quantities.  Bowels now moving.  No further chest pain/tightness.  No dyspnea.  Objective: Filed Vitals:   12/18/12 1426 12/18/12 2010 12/19/12 0500 12/19/12 0617  BP: 146/65 128/75  128/70  Pulse: 100 110  109  Temp: 98 F (36.7 C) 98 F (36.7 C)  98.1 F (36.7 C)  TempSrc: Oral Oral  Oral  Resp: 20 20  20   Height:      Weight:   113.5 kg (250 lb 3.6 oz)   SpO2: 96% 98%  96%    Intake/Output Summary (Last 24 hours) at 12/19/12 0831 Last data filed at 12/19/12 0700  Gross per 24 hour  Intake    810 ml  Output    950 ml  Net   -140 ml    Exam: Gen:  NAD Cardiovascular:  Tachycardic, irregular. Respiratory:  Lungs CTAB Gastrointestinal:  Abdomen soft, NT/ND, + BS Extremities:  No C/E/C  Data Reviewed: Basic Metabolic Panel:  Recent Labs Lab 12/15/12 0330 12/16/12 0325 12/17/12 0340 12/18/12 0620 12/19/12 0320  NA 151* 152* 154* 150* 147*  K 3.5 3.6 4.1 4.0 3.8  CL 116* 119* 121* 118* 115*  CO2 23 25 23 21 21   GLUCOSE 151* 167* 159* 104* 144*  BUN  25* 28* 26* 24* 25*  CREATININE 1.61* 1.51* 1.35* 1.45* 1.52*  CALCIUM 9.7 9.4 9.1 8.6 8.3*   GFR Estimated Creatinine Clearance: 39.6 ml/min (by C-G formula based on Cr of 1.52).  Coagulation profile  Recent Labs Lab 12/15/12 0330 12/16/12 0325 12/17/12 0340 12/18/12 0620 12/19/12 0320  INR  1.21 1.42 1.76* 2.31* 2.48*    CBC:  Recent Labs Lab 12/15/12 0330 12/16/12 0325 12/17/12 0340 12/18/12 0620 12/19/12 0320  WBC 8.6 7.8 7.0 6.1 5.6  HGB 12.9 12.8 13.8 12.8 13.4  HCT 40.7 39.2 43.3 41.2 41.5  MCV 81.6 81.0 81.2 81.9 81.7  PLT 185 183 182 204 211   Cardiac Enzymes:  Recent Labs Lab 12/18/12 1527 12/18/12 2101 12/19/12 0320  TROPONINI <0.30 <0.30 <0.30   CBG:  Recent Labs Lab 12/18/12 1641 12/18/12 2021 12/18/12 2349 12/19/12 0358 12/19/12 0746  GLUCAP 163* 164* 130* 144* 131*   Microbiology Recent Results (from the past 240 hour(s))  CULTURE, BLOOD (ROUTINE X 2)     Status: None   Collection Time    12/13/12 12:12 PM      Result Value Range Status   Specimen Description BLOOD LEFT HAND   Final   Special Requests BOTTLES DRAWN AEROBIC AND ANAEROBIC 6CC   Final   Culture  Setup Time     Final   Value: 12/13/2012 15:44     Performed at [facility]   Culture     Final   Value:        BLOOD CULTURE RECEIVED NO GROWTH TO DATE CULTURE WILL BE HELD FOR 5 DAYS BEFORE ISSUING A FINAL NEGATIVE REPORT     Performed at [facility]   Report Status PENDING   Incomplete  CULTURE, BLOOD (ROUTINE X 2)     Status: None   Collection Time    12/13/12 12:20 PM      Result Value Range Status   Specimen Description BLOOD LEFT HAND   Final   Special Requests BOTTLES DRAWN AEROBIC AND ANAEROBIC 4CC   Final   Culture  Setup Time     Final   Value: 12/13/2012 15:44     Performed at [facility]   Culture     Final   Value:        BLOOD CULTURE RECEIVED NO GROWTH TO DATE CULTURE WILL BE HELD FOR 5 DAYS BEFORE ISSUING A FINAL NEGATIVE REPORT     Performed at [facility]   Report Status PENDING   Incomplete  MRSA PCR SCREENING     Status: None   Collection Time    12/14/12  4:04 PM      Result Value Range Status   MRSA by PCR NEGATIVE  NEGATIVE Final   Comment:            The GeneXpert MRSA Assay (FDA     approved for NASAL  specimens     only), is one component of a     comprehensive MRSA colonization     surveillance program. It is not     intended to diagnose MRSA     infection nor to guide or     monitor treatment for     MRSA infections.      Procedures and Diagnostic Studies: Ct Abdomen Pelvis Wo Contrast  12/09/2012   *RADIOLOGY REPORT*  Clinical Data: Lower pelvic pain  CT ABDOMEN AND PELVIS WITHOUT CONTRAST  Technique:  Multidetector CT imaging of the abdomen and  pelvis was performed following the standard protocol without intravenous contrast.  Comparison: None.  Findings: Visualized lung bases appear normal.  Hepatic cyst is noted inferiorly in right hepatic lobe.  The spleen and pancreas appear normal.  No gallstones are noted.  Adrenal glands and kidneys appear normal.  No renal or ureteral calculi are noted. Urinary bladder appears normal.  Calcified fibroids are seen involving the uterus.  Phleboliths are noted in the pelvis. Sigmoid diverticulosis is noted without inflammation.  There is noted a ventral hernia in the right lower quadrant of the abdomen which contains a loop of incarcerated terminal ileum and results in partial obstruction of the more proximal small bowel.  Wall thickening and inflammatory changes are seen involving the more distal terminal ileum extending to the ileocecal valve.  No abnormal fluid collection is noted.  Contrast is noted within the colon as well as stool.  IMPRESSION: Ventral hernia is noted in the right lower quadrant of the anterior abdominal wall which contains a loop of incarcerated terminal ileum, resulting in partial small bowel obstruction and dilatation of the more proximal small bowel.   Original Report Authenticated By: Marijo Conception.,  M.D.   Dg Abd 2 Views  12/10/2012   CLINICAL DATA:  Evaluate for small bowel obstruction. Periumbilical hernia with prior surgery. Now with protruding hernia right abdominal region and occasional constipation.  EXAM: ABDOMEN - 2  VIEW  COMPARISON:  12/09/2012 CT.  FINDINGS: Small bowel obstructive pattern with contrast seen in portions of the colon. Gas distended small bowel loops measuring up to 5 cm with thickened folds.  No free intraperitoneal air detected.  IMPRESSION: Small bowel obstructive pattern with contrast seen in portions of the colon. Gas distended small bowel loops measuring up to 5 cm with thickened folds. No free intraperitoneal air detected.   Electronically Signed   By: Chauncey Cruel   On: 12/10/2012 10:36   Dg Abd Acute W/chest  12/09/2012   CLINICAL DATA:  Abdominal pain, hypertension.  EXAM: ACUTE ABDOMEN SERIES (ABDOMEN 2 VIEW & CHEST 1 VIEW)  COMPARISON:  07/27/2012  FINDINGS: Dilated small bowel loops with scattered air-fluid levels in the abdomen. Findings concerning for small bowel obstruction. Calcifications in the center of the pelvis, likely calcified fibroids. No organomegaly. No free air.  IMPRESSION: Dilated small bowel loops with scattered air-fluid levels concerning for small bowel obstruction.   Electronically Signed   By: Rolm Baptise M.D.   On: 12/09/2012 12:22   Dg Abd Portable 1v  12/10/2012   *RADIOLOGY REPORT*  Clinical Data: NG tube placement.  PORTABLE ABDOMEN - 1 VIEW 5:49 p.m.  Comparison: 12/10/2012 at 9:24 a.m.  Findings: NG tube has been inserted and the tip is in the proximal stomach just below the gastroesophageal junction.  There is air throughout the large and small bowel including slightly distended loops of small bowel in the lower abdomen.  Degenerative changes are present throughout the spine.  IMPRESSION: NG tube tip is in the upper stomach just beyond the gastroesophageal junction.  Persistent slight distention of small bowel.  Contrast has passed into the colon.   Original Report Authenticated By: Lorriane Shire, M.D.   2-D echocardiogram  12/15/2012  Study Conclusions  - Left ventricle: The cavity size was normal. Systolic function was normal. The estimated ejection  fraction was in the range of 60% to 65%. Although no diagnostic regional wall motion abnormality was identified, this possibility cannot be completely excluded on the basis of this study. -  Aortic valve: Mild regurgitation. - Mitral valve: Calcified annulus. Mildly thickened leaflets . Mild regurgitation. - Left atrium: The atrium was mildly dilated. - Pulmonary arteries: Systolic pressure was moderately increased. PA peak pressure: 80mm Hg (S).  Scheduled Meds: . amiodarone  200 mg Oral Daily  . diltiazem  90 mg Oral Q6H  . guaiFENesin  600 mg Oral BID  . insulin aspart  0-9 Units Subcutaneous Q4H  . metoprolol  5 mg Intravenous Q12H  . Warfarin - Pharmacist Dosing Inpatient   Does not apply q1800   Continuous Infusions: . 0.45 % NaCl with KCl 20 mEq / L 125 mL/hr at 12/18/12 1306  . diltiazem (CARDIZEM) infusion Stopped (12/17/12 0819)    Time spent: 25 minutes.    LOS: 10 days   [facility] Pager [phone].   *Please note that the hospitalists switch teams on Wednesdays. Please call the flow manager at [phone] if you are having difficulty reaching the hospitalist taking care of this patient as she can update you and provide the most up-to-date pager number of provider caring for the patient. If 8PM-8AM, please contact night-coverage at [email or web address], password [facility]  12/19/2012, 8:31 AM

## 2012-12-19 NOTE — Progress Notes (Signed)
Patient ID: [name], female   DOB: [birth year], 79 y.o.   MRN: [id number] [facility] Surgery Progress Note:   7 Days Post-Op  Subjective: Mental status is clear.  Feeling much better and more upbeat today.   Objective: Vital signs in last 24 hours: Temp:  [98 F (36.7 C)-98.1 F (36.7 C)] 98.1 F (36.7 C) (09/28 0617) Pulse Rate:  [100-110] 109 (09/28 0617) Resp:  [20] 20 (09/28 0617) BP: (128-146)/(65-75) 128/70 mmHg (09/28 0617) SpO2:  [96 %-98 %] 96 % (09/28 0617) Weight:  [250 lb 3.6 oz (113.5 kg)] 250 lb 3.6 oz (113.5 kg) (09/28 0500)  Intake/Output from previous day: 09/27 0701 - 09/28 0700 In: 1170 [P.O.:360; I.V.:810] Out: 950 [Urine:950] Intake/Output this shift:    Physical Exam: Work of breathing is not labored.  Abdomen is nondistended and patient is having bowel movements.    Lab Results:  Results for orders placed during the hospital encounter of 12/09/12 (from the past 48 hour(s))  GLUCOSE, CAPILLARY     Status: Abnormal   Collection Time    12/17/12 11:49 AM      Result Value Range   Glucose-Capillary 156 (*) 70 - 99 mg/dL  GLUCOSE, CAPILLARY     Status: Abnormal   Collection Time    12/17/12  4:45 PM      Result Value Range   Glucose-Capillary 159 (*) 70 - 99 mg/dL   Comment 1 Documented in Chart     Comment 2 Notify RN    GLUCOSE, CAPILLARY     Status: Abnormal   Collection Time    12/17/12  8:22 PM      Result Value Range   Glucose-Capillary 161 (*) 70 - 99 mg/dL  GLUCOSE, CAPILLARY     Status: Abnormal   Collection Time    12/18/12 12:01 AM      Result Value Range   Glucose-Capillary 133 (*) 70 - 99 mg/dL  GLUCOSE, CAPILLARY     Status: None   Collection Time    12/18/12  4:01 AM      Result Value Range   Glucose-Capillary 72  70 - 99 mg/dL  PROTIME-INR     Status: Abnormal   Collection Time    12/18/12  6:20 AM      Result Value Range   Prothrombin Time 24.6 (*) 11.6 - 15.2 seconds   INR 2.31 (*) 0.00 - 1.49  CBC     Status:  Abnormal   Collection Time    12/18/12  6:20 AM      Result Value Range   WBC 6.1  4.0 - 10.5 K/uL   RBC 5.03  3.87 - 5.11 MIL/uL   Hemoglobin 12.8  12.0 - 15.0 g/dL   HCT 41.2  36.0 - 46.0 %   MCV 81.9  78.0 - 100.0 fL   MCH 25.4 (*) 26.0 - 34.0 pg   MCHC 31.1  30.0 - 36.0 g/dL   RDW 15.2  11.5 - 15.5 %   Platelets 204  150 - 400 K/uL  HEPARIN LEVEL (UNFRACTIONATED)     Status: None   Collection Time    12/18/12  6:20 AM      Result Value Range   Heparin Unfractionated 0.36  0.30 - 0.70 IU/mL   Comment:            IF HEPARIN RESULTS ARE BELOW     EXPECTED VALUES, AND PATIENT     DOSAGE HAS BEEN CONFIRMED,  SUGGEST FOLLOW UP TESTING     OF ANTITHROMBIN III LEVELS.  BASIC METABOLIC PANEL     Status: Abnormal   Collection Time    12/18/12  6:20 AM      Result Value Range   Sodium 150 (*) 135 - 145 mEq/L   Potassium 4.0  3.5 - 5.1 mEq/L   Chloride 118 (*) 96 - 112 mEq/L   CO2 21  19 - 32 mEq/L   Glucose, Bld 104 (*) 70 - 99 mg/dL   BUN 24 (*) 6 - 23 mg/dL   Creatinine, Ser 1.45 (*) 0.50 - 1.10 mg/dL   Calcium 8.6  8.4 - 10.5 mg/dL   GFR calc non Af Amer 34 (*) >90 mL/min   GFR calc Af Amer 39 (*) >90 mL/min   Comment: (NOTE)     The eGFR has been calculated using the CKD EPI equation.     This calculation has not been validated in all clinical situations.     eGFR's persistently <90 mL/min signify possible Chronic Kidney     Disease.  GLUCOSE, CAPILLARY     Status: Abnormal   Collection Time    12/18/12  7:37 AM      Result Value Range   Glucose-Capillary 113 (*) 70 - 99 mg/dL  GLUCOSE, CAPILLARY     Status: Abnormal   Collection Time    12/18/12 11:52 AM      Result Value Range   Glucose-Capillary 184 (*) 70 - 99 mg/dL  TROPONIN I     Status: None   Collection Time    12/18/12  3:27 PM      Result Value Range   Troponin I <0.30  <0.30 ng/mL   Comment:            Due to the release kinetics of cTnI,     a negative result within the first hours     of the  onset of symptoms does not rule out     myocardial infarction with certainty.     If myocardial infarction is still suspected,     repeat the test at appropriate intervals.  PRO B NATRIURETIC PEPTIDE     Status: Abnormal   Collection Time    12/18/12  3:27 PM      Result Value Range   Pro B Natriuretic peptide (BNP) 3788.0 (*) 0 - 450 pg/mL  GLUCOSE, CAPILLARY     Status: Abnormal   Collection Time    12/18/12  4:41 PM      Result Value Range   Glucose-Capillary 163 (*) 70 - 99 mg/dL  GLUCOSE, CAPILLARY     Status: Abnormal   Collection Time    12/18/12  8:21 PM      Result Value Range   Glucose-Capillary 164 (*) 70 - 99 mg/dL  TROPONIN I     Status: None   Collection Time    12/18/12  9:01 PM      Result Value Range   Troponin I <0.30  <0.30 ng/mL   Comment:            Due to the release kinetics of cTnI,     a negative result within the first hours     of the onset of symptoms does not rule out     myocardial infarction with certainty.     If myocardial infarction is still suspected,     repeat the test at appropriate intervals.  GLUCOSE, CAPILLARY  Status: Abnormal   Collection Time    12/18/12 11:49 PM      Result Value Range   Glucose-Capillary 130 (*) 70 - 99 mg/dL  TROPONIN I     Status: None   Collection Time    12/19/12  3:20 AM      Result Value Range   Troponin I <0.30  <0.30 ng/mL   Comment:            Due to the release kinetics of cTnI,     a negative result within the first hours     of the onset of symptoms does not rule out     myocardial infarction with certainty.     If myocardial infarction is still suspected,     repeat the test at appropriate intervals.  PROTIME-INR     Status: Abnormal   Collection Time    12/19/12  3:20 AM      Result Value Range   Prothrombin Time 26.0 (*) 11.6 - 15.2 seconds   INR 2.48 (*) 0.00 - 1.49  CBC     Status: None   Collection Time    12/19/12  3:20 AM      Result Value Range   WBC 5.6  4.0 - 10.5 K/uL    RBC 5.08  3.87 - 5.11 MIL/uL   Hemoglobin 13.4  12.0 - 15.0 g/dL   HCT 41.5  36.0 - 46.0 %   MCV 81.7  78.0 - 100.0 fL   MCH 26.4  26.0 - 34.0 pg   MCHC 32.3  30.0 - 36.0 g/dL   RDW 15.2  11.5 - 15.5 %   Platelets 211  150 - 400 K/uL  BASIC METABOLIC PANEL     Status: Abnormal   Collection Time    12/19/12  3:20 AM      Result Value Range   Sodium 147 (*) 135 - 145 mEq/L   Potassium 3.8  3.5 - 5.1 mEq/L   Chloride 115 (*) 96 - 112 mEq/L   CO2 21  19 - 32 mEq/L   Glucose, Bld 144 (*) 70 - 99 mg/dL   BUN 25 (*) 6 - 23 mg/dL   Creatinine, Ser 1.52 (*) 0.50 - 1.10 mg/dL   Calcium 8.3 (*) 8.4 - 10.5 mg/dL   GFR calc non Af Amer 32 (*) >90 mL/min   GFR calc Af Amer 37 (*) >90 mL/min   Comment: (NOTE)     The eGFR has been calculated using the CKD EPI equation.     This calculation has not been validated in all clinical situations.     eGFR's persistently <90 mL/min signify possible Chronic Kidney     Disease.  GLUCOSE, CAPILLARY     Status: Abnormal   Collection Time    12/19/12  3:58 AM      Result Value Range   Glucose-Capillary 144 (*) 70 - 99 mg/dL  GLUCOSE, CAPILLARY     Status: Abnormal   Collection Time    12/19/12  7:46 AM      Result Value Range   Glucose-Capillary 131 (*) 70 - 99 mg/dL    Radiology/Results: Dg Chest Port 1 View  12/18/2012   CLINICAL DATA:  Mid chest pain. History of hypertension and diabetes.  EXAM: PORTABLE CHEST - 1 VIEW  COMPARISON:  12/09/2012  FINDINGS: Since the prior study, opacity has developed at the lung bases obscuring the hemidiaphragms likely a combination of pleural effusions with either atelectasis, infiltrate  or a combination.  There is no pulmonary edema.  Cardiac silhouette is mostly obscured. No mediastinal or hilar masses are noted. There is no pneumothorax.  IMPRESSION: 1. New lung base opacity likely due to a combination of pleural effusions with atelectasis. Infiltrate is possible. No pulmonary edema.   Electronically Signed   By:  Lajean Manes   On: 12/18/2012 16:07    Anti-infectives: Anti-infectives   Start     Dose/Rate Route Frequency Ordered Stop   12/12/12 1800  ceFAZolin (ANCEF) IVPB 2 g/50 mL premix     2 g 100 mL/hr over 30 Minutes Intravenous  Once 12/12/12 1638 12/12/12 1900   12/12/12 1126  ceFAZolin (ANCEF) IVPB 2 g/50 mL premix     2 g 100 mL/hr over 30 Minutes Intravenous 30 min pre-op 12/12/12 1122 12/12/12 1140      Assessment/Plan: Problem List: Patient Active Problem List   Diagnosis Date Noted  . Ileus, postoperative 12/17/2012  . Umbilical hernia, incarcerated - reducible 12/10/2012  . SBO (small bowel obstruction) 12/09/2012  . HTN (hypertension) 12/09/2012  . Dyslipidemia 12/09/2012  . Atrial fibrillation 12/09/2012  . Diabetes 12/09/2012  . CKD (chronic kidney disease), stage III 12/09/2012    Will advance to carb modified solid diet.  Need to get out of bed more to regain strength.  7 Days Post-Op    LOS: 10 days   Matt B. Hassell Done, MD, [facility] [phone] beeper [phone]  12/19/2012 9:34 AM

## 2012-12-20 LAB — CBC
HCT: 41.7 % (ref 36.0–46.0)
Hemoglobin: 13.4 g/dL (ref 12.0–15.0)
MCV: 81.3 fL (ref 78.0–100.0)
Platelets: 219 10*3/uL (ref 150–400)
RBC: 5.13 MIL/uL — ABNORMAL HIGH (ref 3.87–5.11)
RDW: 15.2 % (ref 11.5–15.5)
WBC: 8.3 10*3/uL (ref 4.0–10.5)

## 2012-12-20 LAB — GLUCOSE, CAPILLARY: Glucose-Capillary: 144 mg/dL — ABNORMAL HIGH (ref 70–99)

## 2012-12-20 LAB — PROTIME-INR: INR: 2.72 — ABNORMAL HIGH (ref 0.00–1.49)

## 2012-12-20 MED ORDER — ACETAMINOPHEN 325 MG PO TABS: 650.0000 mg | ORAL_TABLET | Freq: Four times a day (QID) | ORAL | Status: DC | PRN

## 2012-12-20 MED ORDER — OXYCODONE-ACETAMINOPHEN 5-325 MG PO TABS
1.0000 | ORAL_TABLET | ORAL | Status: DC | PRN
  Administered 2012-12-21: 1 via ORAL
  Filled 2012-12-20: qty 1

## 2012-12-20 MED ORDER — WARFARIN SODIUM 2 MG PO TABS
2.0000 mg | ORAL_TABLET | Freq: Once | ORAL | Status: DC
  Administered 2012-12-20: 2 mg via ORAL
  Filled 2012-12-20: qty 1

## 2012-12-20 MED ORDER — ATORVASTATIN CALCIUM 40 MG PO TABS
40.0000 mg | ORAL_TABLET | Freq: Every day | ORAL | Status: DC
  Administered 2012-12-20: 40 mg via ORAL
  Filled 2012-12-20 (×2): qty 1

## 2012-12-20 NOTE — Progress Notes (Signed)
[facility] is able to accept patient.  \Solene Hereford C. Canadian MSW, [place]

## 2012-12-20 NOTE — Progress Notes (Signed)
Physical Therapy Treatment Patient Details Name: Melanie MURREY MRN: [id number] DOB: November 26, 1933 Today's Date: 12/20/2012 Time: [id number] PT Time Calculation (min): 18 min  PT Assessment / Plan / Recommendation  History of Present Illness Pt is s/p exploratory lap with LOA and hernia repair 12/13/12   PT Comments   Pt progressing in functional mobility. Requires assistance for safe bed mobility and cues for precautions. Plans for SNF rehab.  Follow Up Recommendations  SNF     Does the patient have the potential to tolerate intense rehabilitation     Barriers to Discharge        Equipment Recommendations  Rolling walker with 5" wheels    Recommendations for Other Services    Frequency Min 3X/week   Progress towards PT Goals Progress towards PT goals: Progressing toward goals  Plan Current plan remains appropriate;Frequency needs to be updated    Precautions / Restrictions Precautions Precautions: Fall Precaution Comments: abdominal binder.   Pertinent Vitals/Pain 3/4 after ambulation.    Mobility  Bed Mobility Rolling Right: 3: Mod assist;With rail Rolling Left: 3: Mod assist;With rail Sit to Supine: 2: Max assist Details for Bed Mobility Assistance: assistance for legs onto bed. cues for abdominal precautions.. verbal cues to flex knees to roll  and use of rail Transfers Sit to Stand: From chair/3-in-1;With armrests;3: Mod assist Stand to Sit: 3: Mod assist;With upper extremity assist;With armrests;To chair/3-in-1;To bed Details for Transfer Assistance: increased time, pt has to rock to stand, decreased control of descent. multimodal cues for use of UE's to self assist. Ambulation/Gait Ambulation/Gait Assistance: 1: +2 Total assist Ambulation/Gait: Patient Percentage: 70% Ambulation Distance (Feet): 80 Feet Ambulation/Gait Assistance Details: increased time,pt noted with mild dyspnea. Gait Pattern: Step-through pattern;Trunk flexed;Decreased stride length;Decreased  hip/knee flexion - right;Decreased hip/knee flexion - left Gait velocity: decreased    Exercises     PT Diagnosis:    PT Problem List:   PT Treatment Interventions:     PT Goals (current goals can now be found in the care plan section)    Visit Information  Last PT Received On: 12/20/12 Assistance Needed: +2 History of Present Illness: Pt is s/p exploratory lap with LOA and hernia repair 12/13/12    Subjective Data      Cognition  Cognition Arousal/Alertness: Awake/alert Behavior During Therapy: WFL for tasks assessed/performed Overall Cognitive Status: Within Functional Limits for tasks assessed    Balance     End of Session PT - End of Session Activity Tolerance: Patient tolerated treatment well Patient left: in bed;with call bell/phone within reach;with family/visitor present;with bed alarm set Nurse Communication: Mobility status   GP     Claretha Cooper 12/20/2012, 4:45 PM

## 2012-12-20 NOTE — Progress Notes (Signed)
8 Days Post-Op  Subjective: Eating a regular diet, incisions sites look fine. She is just getting IV  MS for pain.  I will add oral pain med/  Objective: Vital signs in last 24 hours: Temp:  [98.1 F (36.7 C)-98.5 F (36.9 C)] 98.2 F (36.8 C) (09/29 0515) Pulse Rate:  [91-109] 91 (09/29 0515) Resp:  [20-24] 20 (09/29 0515) BP: (123-129)/(70-75) 127/70 mmHg (09/29 0515) SpO2:  [96 %-98 %] 97 % (09/29 0515) Weight:  [114.1 kg (251 lb 8.7 oz)] 114.1 kg (251 lb 8.7 oz) (09/29 0515) Last BM Date: 12/09/12 600 ml PO. 3 BM's recorded, Diet: carb Modified. Afebrile, VSS CBC OK INR 2.72   Intake/Output from previous day: 09/28 0701 - 09/29 0700 In: 1060 [P.O.:600; I.V.:460] Out: 575 [Urine:575] Intake/Output this shift:    General appearance: alert, cooperative and no distress Resp: clear to auscultation bilaterally GI: soft, tender, abd binder is up under her breast again, BS are normal with BM x 3 yesterday.  Incisions look fine.  Lab Results:   Recent Labs  12/19/12 0320 12/20/12 0445  WBC 5.6 8.3  HGB 13.4 13.4  HCT 41.5 41.7  PLT 211 219    BMET  Recent Labs  12/18/12 0620 12/19/12 0320  NA 150* 147*  K 4.0 3.8  CL 118* 115*  CO2 21 21  GLUCOSE 104* 144*  BUN 24* 25*  CREATININE 1.45* 1.52*  CALCIUM 8.6 8.3*   PT/INR  Recent Labs  12/19/12 0320 12/20/12 0445  LABPROT 26.0* 27.9*  INR 2.48* 2.72*    No results found for this basename: AST, ALT, ALKPHOS, BILITOT, PROT, ALBUMIN,  in the last 168 hours   Lipase     Component Value Date/Time   LIPASE 26 12/09/2012 1038     Studies/Results: Dg Chest Port 1 View  12/18/2012   CLINICAL DATA:  Mid chest pain. History of hypertension and diabetes.  EXAM: PORTABLE CHEST - 1 VIEW  COMPARISON:  12/09/2012  FINDINGS: Since the prior study, opacity has developed at the lung bases obscuring the hemidiaphragms likely a combination of pleural effusions with either atelectasis, infiltrate or a combination.   There is no pulmonary edema.  Cardiac silhouette is mostly obscured. No mediastinal or hilar masses are noted. There is no pneumothorax.  IMPRESSION: 1. New lung base opacity likely due to a combination of pleural effusions with atelectasis. Infiltrate is possible. No pulmonary edema.   Electronically Signed   By: Lajean Manes   On: 12/18/2012 16:07    Medications: . amiodarone  200 mg Oral Daily  . diltiazem  360 mg Oral Daily  . guaiFENesin  600 mg Oral BID  . insulin aspart  0-5 Units Subcutaneous QHS  . insulin aspart  0-9 Units Subcutaneous TID WC  . metoprolol succinate  12.5 mg Oral BID  . Warfarin - Pharmacist Dosing Inpatient   Does not apply q1800    Assessment/Plan 1. Ventral hernia/SBO/incarcerated SB; s/p Laparoscopic lysis of adhesions with laparoscopic repair of recurrent ventral hernia. 12/13/2012, Madilyn Hook, DO.  Post op ileus  2. Afib on chronic coumadin/amiodarone now with post op RVR  3. Hypertension  5. AODM  6. Chronic renal disease  7. Tobacco use 40 years  8. Dyslipidemia  9.Hx of right breast cancer with lumpectomy and radiation Rx.  10. BMI 38  11. Anemia on FE   Plan:  From our  Standpoint she is doing well and can go home when medically stable.  I will fill out  AVS with instructions and follow up information.  Add PO pain meds.       LOS: 11 days    [name] 12/20/2012

## 2012-12-20 NOTE — Progress Notes (Addendum)
ANTICOAGULATION CONSULT NOTE - Follow Up Consult  Pharmacy Consult for: Warfarin Indication: atrial fibrillation  Patient Measurements: Height: 5\' 6"  (167.6 cm) Weight: 251 lb 8.7 oz (114.1 kg) IBW/kg (Calculated) : 59.3 Heparin Dosing Weight: 84 kg  Labs:  Recent Labs  12/18/12 0620 12/18/12 1527 12/18/12 2101 12/19/12 0320 12/20/12 0445  HGB 12.8  --   --  13.4 13.4  HCT 41.2  --   --  41.5 41.7  PLT 204  --   --  211 219  LABPROT 24.6*  --   --  26.0* 27.9*  INR 2.31*  --   --  2.48* 2.72*  HEPARINUNFRC 0.36  --   --   --   --   CREATININE 1.45*  --   --  1.52*  --   TROPONINI  --  <0.30 <0.30 <0.30  --    Assessment: [name] on chronic warfarin therapy PTA for atrial fibrillation presented 9/18 with ventral hernia/SBO/incarcerated SB.  Warfarin was placed on hold (last dose given 9/18) and heparin bridge started 9/19. Underwent lap ventral hernia repair on 12/12/12.  Heparin resumed 8 hours post-op.  Warfarin resumed 9/23.  Warfarin dose PTA reported as 5 mg daily.  Heparin discontinued 9/27 as INR had reached therapeutic level.  INR (2.72) remains therapeutic, but continues trending up, despite holding on 9/27 and 4mg  dose on 9/28.  Carb modified diet started with only 25% of one meal charted.   CBC:  Hgb and Plt stable/wnl.  No bleeding/issues reported   Noted drug interaction with Amiodarone, continued from PTA.  Goal of Therapy:  INR 2-3 Heparin level 0.3-0.7 units/ml Monitor platelets by anticoagulation protocol: Yes   Plan:   Warfarin 2 mg PO tonight at 1800  Daily INR.   Gretta Arab PharmD, BCPS Pager [phone] 12/20/2012 9:24 AM     Addendum: If discharged, would recommend lower than home dose warfarin.  Warfarin 2mg  PO ordered for 9/29 due to increasing INR.  Recheck INR in at least 2 day on 12/22/12.  Gretta Arab PharmD, BCPS Pager [phone] 12/20/2012 3:14 PM

## 2012-12-20 NOTE — Progress Notes (Signed)
[facility] PROGRESS NOTE  [name] [id number] DOB: 1934-03-22 DOA: 12/09/2012 PCP: Milagros Evener, MD  Brief narrative: [name] is an 79 y.o. female with a PMH of diabetes, hypertension, chronic kidney disease, dyslipidemia, and atrial fibrillation who was admitted on 12/09/2012 with a small bowel obstruction and incarcerated ventral hernia who underwent exploratory laparoscopy with lysis of adhesions and repair of recurrent ventral hernia by Dr. Lilyan Punt on 12/13/2012. Her postoperative course has been complicated by atrial fibrillation with rapid ventricular response necessitating transfer to the step down unit on 12/14/2012.  Assessment/Plan: Principal Problem:   SBO (small bowel obstruction) secondary to incarcerated umbilical hernia -Status post exploratory laparoscopy with lysis of adhesions and repair of recurrent ventral hernia 12/13/2012. -NG tube removed 12/18/2012.  Diet advanced to carb modified 12/19/2012.  Tolerating well. Active Problems:   Chest tightness -Pro BNP elevated at 3788. Troponins negative x3. 12-lead EKG results not currently available. -Given lasix 40 mg IV x 1 12/18/2012 with alleviation of symptoms.   Hypernatremia -IVF KVO'd secondary to chest tightness. -Taking in oral liquids.  Sodium coming down.   Chest tightness / ? Chronic diastolic CHF -Pro BNP elevated at 3788. Troponins negative x3. 12-lead EKG showed afib with RVR but no acute changes. -Given lasix 40 mg IV x 1 12/18/2012. -2-D echocardiogram done 12/15/2012 with EF 60-65% and no regional wall motion abnormalities or mention of diastolic dysfunction.   HTN (hypertension) -Controlled on metoprolol, amiodarone and Cardizem.   Dyslipidemia -Resume statin.   Atrial fibrillation -Continue amiodarone, metoprolol (dose increased 12/19/12) and Cardizem.   -Heparin/Coumadin resumed 12/14/2012. -Seen by cardiology 12/13/2012.   Diabetes -Hemoglobin 8.1%. Seen by diabetes  coordinator. -Controlled on SSI Q 4 hours while n.p.o. -CBG is 131-222.   CKD (chronic kidney disease), stage III -Creatinine appears to be better than usual baseline. Creatinine was 1.7 in 2012.  Code Status: Full. Family Communication: Family updated at the bedside. Disposition Plan: Home versus SNF when stable.   Medical Consultants:  Dr. Madilyn Hook, Surgery  Dr. Daneen Schick III, Cardiology  Other Consultants:  Physical therapy  Diabetes coordinator  Anti-infectives:  None.  HPI/Subjective: [name] is sitting up in the chair and feels much better.  Tolerating a regular diet.  Some incisional pain, but no further chest pain/tightness or nausea/vomiting.  Objective: Filed Vitals:   12/19/12 0617 12/19/12 1300 12/19/12 2211 12/20/12 0515  BP: 128/70 123/70 129/75 127/70  Pulse: 109 98 109 91  Temp: 98.1 F (36.7 C) 98.5 F (36.9 C) 98.1 F (36.7 C) 98.2 F (36.8 C)  TempSrc: Oral Oral Oral Oral  Resp: 20 24 22 20   Height:      Weight:    114.1 kg (251 lb 8.7 oz)  SpO2: 96% 98% 96% 97%    Intake/Output Summary (Last 24 hours) at 12/20/12 0809 Last data filed at 12/20/12 0600  Gross per 24 hour  Intake    700 ml  Output    375 ml  Net    325 ml    Exam: Gen:  NAD Cardiovascular:  Tachycardic, irregular. Respiratory:  Lungs CTAB Gastrointestinal:  Abdomen soft, NT/ND, + BS Extremities:  No C/E/C  Data Reviewed: Basic Metabolic Panel:  Recent Labs Lab 12/15/12 0330 12/16/12 0325 12/17/12 0340 12/18/12 0620 12/19/12 0320  NA 151* 152* 154* 150* 147*  K 3.5 3.6 4.1 4.0 3.8  CL 116* 119* 121* 118* 115*  CO2 23 25 23 21 21   GLUCOSE 151* 167* 159* 104* 144*  BUN 25* 28* 26* 24* 25*  CREATININE 1.61* 1.51* 1.35* 1.45* 1.52*  CALCIUM 9.7 9.4 9.1 8.6 8.3*   GFR Estimated Creatinine Clearance: 39.7 ml/min (by C-G formula based on Cr of 1.52).  Coagulation profile  Recent Labs Lab 12/16/12 0325 12/17/12 0340 12/18/12 0620  12/19/12 0320 12/20/12 0445  INR 1.42 1.76* 2.31* 2.48* 2.72*    CBC:  Recent Labs Lab 12/16/12 0325 12/17/12 0340 12/18/12 0620 12/19/12 0320 12/20/12 0445  WBC 7.8 7.0 6.1 5.6 8.3  HGB 12.8 13.8 12.8 13.4 13.4  HCT 39.2 43.3 41.2 41.5 41.7  MCV 81.0 81.2 81.9 81.7 81.3  PLT 183 182 204 211 219   Cardiac Enzymes:  Recent Labs Lab 12/18/12 1527 12/18/12 2101 12/19/12 0320  TROPONINI <0.30 <0.30 <0.30   CBG:  Recent Labs Lab 12/19/12 0358 12/19/12 0746 12/19/12 1143 12/19/12 1648 12/19/12 2214  GLUCAP 144* 131* 183* 191* 222*   Microbiology Recent Results (from the past 240 hour(s))  CULTURE, BLOOD (ROUTINE X 2)     Status: None   Collection Time    12/13/12 12:12 PM      Result Value Range Status   Specimen Description BLOOD LEFT HAND   Final   Special Requests BOTTLES DRAWN AEROBIC AND ANAEROBIC [facility]   Final   Culture  Setup Time     Final   Value: 12/13/2012 15:44     Performed at [facility]   Culture     Final   Value: NO GROWTH 5 DAYS     Performed at [facility]   Report Status 12/19/2012 FINAL   Final  CULTURE, BLOOD (ROUTINE X 2)     Status: None   Collection Time    12/13/12 12:20 PM      Result Value Range Status   Specimen Description BLOOD LEFT HAND   Final   Special Requests BOTTLES DRAWN AEROBIC AND ANAEROBIC 4CC   Final   Culture  Setup Time     Final   Value: 12/13/2012 15:44     Performed at [facility]   Culture     Final   Value: NO GROWTH 5 DAYS     Performed at [facility]   Report Status 12/19/2012 FINAL   Final  MRSA PCR SCREENING     Status: None   Collection Time    12/14/12  4:04 PM      Result Value Range Status   MRSA by PCR NEGATIVE  NEGATIVE Final   Comment:            The GeneXpert MRSA Assay (FDA     approved for NASAL specimens     only), is one component of a     comprehensive MRSA colonization     surveillance program. It is not     intended to diagnose MRSA      infection nor to guide or     monitor treatment for     MRSA infections.      Procedures and Diagnostic Studies: Ct Abdomen Pelvis Wo Contrast  12/09/2012   *RADIOLOGY REPORT*  Clinical Data: Lower pelvic pain  CT ABDOMEN AND PELVIS WITHOUT CONTRAST  Technique:  Multidetector CT imaging of the abdomen and pelvis was performed following the standard protocol without intravenous contrast.  Comparison: None.  Findings: Visualized lung bases appear normal.  Hepatic cyst is noted inferiorly in right hepatic lobe.  The spleen and pancreas appear normal.  No gallstones are noted.  Adrenal glands and kidneys appear normal.  No renal or ureteral calculi are noted. Urinary bladder appears normal.  Calcified fibroids are seen involving the uterus.  Phleboliths are noted in the pelvis. Sigmoid diverticulosis is noted without inflammation.  There is noted a ventral hernia in the right lower quadrant of the abdomen which contains a loop of incarcerated terminal ileum and results in partial obstruction of the more proximal small bowel.  Wall thickening and inflammatory changes are seen involving the more distal terminal ileum extending to the ileocecal valve.  No abnormal fluid collection is noted.  Contrast is noted within the colon as well as stool.  IMPRESSION: Ventral hernia is noted in the right lower quadrant of the anterior abdominal wall which contains a loop of incarcerated terminal ileum, resulting in partial small bowel obstruction and dilatation of the more proximal small bowel.   Original Report Authenticated By: Marijo Conception.,  M.D.   Dg Abd 2 Views  12/10/2012   CLINICAL DATA:  Evaluate for small bowel obstruction. Periumbilical hernia with prior surgery. Now with protruding hernia right abdominal region and occasional constipation.  EXAM: ABDOMEN - 2 VIEW  COMPARISON:  12/09/2012 CT.  FINDINGS: Small bowel obstructive pattern with contrast seen in portions of the colon. Gas distended small bowel loops  measuring up to 5 cm with thickened folds.  No free intraperitoneal air detected.  IMPRESSION: Small bowel obstructive pattern with contrast seen in portions of the colon. Gas distended small bowel loops measuring up to 5 cm with thickened folds. No free intraperitoneal air detected.   Electronically Signed   By: Chauncey Cruel   On: 12/10/2012 10:36   Dg Abd Acute W/chest  12/09/2012   CLINICAL DATA:  Abdominal pain, hypertension.  EXAM: ACUTE ABDOMEN SERIES (ABDOMEN 2 VIEW & CHEST 1 VIEW)  COMPARISON:  07/27/2012  FINDINGS: Dilated small bowel loops with scattered air-fluid levels in the abdomen. Findings concerning for small bowel obstruction. Calcifications in the center of the pelvis, likely calcified fibroids. No organomegaly. No free air.  IMPRESSION: Dilated small bowel loops with scattered air-fluid levels concerning for small bowel obstruction.   Electronically Signed   By: Rolm Baptise M.D.   On: 12/09/2012 12:22   Dg Abd Portable 1v  12/10/2012   *RADIOLOGY REPORT*  Clinical Data: NG tube placement.  PORTABLE ABDOMEN - 1 VIEW 5:49 p.m.  Comparison: 12/10/2012 at 9:24 a.m.  Findings: NG tube has been inserted and the tip is in the proximal stomach just below the gastroesophageal junction.  There is air throughout the large and small bowel including slightly distended loops of small bowel in the lower abdomen.  Degenerative changes are present throughout the spine.  IMPRESSION: NG tube tip is in the upper stomach just beyond the gastroesophageal junction.  Persistent slight distention of small bowel.  Contrast has passed into the colon.   Original Report Authenticated By: Lorriane Shire, M.D.   2-D echocardiogram  12/15/2012  Study Conclusions  - Left ventricle: The cavity size was normal. Systolic function was normal. The estimated ejection fraction was in the range of 60% to 65%. Although no diagnostic regional wall motion abnormality was identified, this possibility cannot be completely  excluded on the basis of this study. - Aortic valve: Mild regurgitation. - Mitral valve: Calcified annulus. Mildly thickened leaflets . Mild regurgitation. - Left atrium: The atrium was mildly dilated. - Pulmonary arteries: Systolic pressure was moderately increased. PA peak pressure: 91mm Hg (S).  Scheduled Meds: . amiodarone  200 mg Oral Daily  . diltiazem  360 mg Oral Daily  . guaiFENesin  600 mg Oral BID  . insulin aspart  0-5 Units Subcutaneous QHS  . insulin aspart  0-9 Units Subcutaneous TID WC  . metoprolol succinate  12.5 mg Oral BID  . Warfarin - Pharmacist Dosing Inpatient   Does not apply q1800   Continuous Infusions: . 0.45 % NaCl with KCl 20 mEq / L 20 mL/hr at 12/20/12 0600    Time spent: 25 minutes.    LOS: 11 days   [facility] Pager [phone].   *Please note that the hospitalists switch teams on Wednesdays. Please call the flow manager at [phone] if you are having difficulty reaching the hospitalist taking care of this patient as she can update you and provide the most up-to-date pager number of provider caring for the patient. If 8PM-8AM, please contact night-coverage at [email or web address], password [facility]  12/20/2012, 8:09 AM

## 2012-12-20 NOTE — Clinical Social Work Psychosocial (Unsigned)
     Clinical Social Work Department BRIEF PSYCHOSOCIAL ASSESSMENT 12/20/2012  Patient:  [name], [name]     Account Number:  [id number]     Admit date:  12/09/2012  Clinical Social Worker:  Venia Minks  Date/Time:  12/20/2012 12:00 M  Referred by:  Physician  Date Referred:  12/20/2012 Referred for  SNF Placement   Other Referral:   Interview type:  Patient Other interview type:   family at bedside    PSYCHOSOCIAL DATA Living Status:  FAMILY Admitted from facility:   Level of care:   Primary support name:  Anastasia Fiedler Primary support relationship to patient:  SPOUSE Degree of support available:   excellent    CURRENT CONCERNS Current Concerns  Post-Acute Placement   Other Concerns:    SOCIAL WORK ASSESSMENT / PLAN CSW met with patient and patient's family at bedside. patient in need of snf placement. Patient is alert and oriented X3 and extremely pleasant. Discussed need for snf. patient is agreeable to snf and if possible, family would prefer [facility].   Assessment/plan status:   Other assessment/ plan:   Information/referral to community resources:    PATIENTS/FAMILYS RESPONSE TO PLAN OF CARE: Patient and family are very pleasant and hopeful for patient's recovery following a short stay at a rehab center. they prefer [facility] but understand need for county wide fax out.

## 2012-12-21 LAB — CBC
HCT: 41.7 % (ref 36.0–46.0)
Hemoglobin: 13.2 g/dL (ref 12.0–15.0)
MCH: 25.7 pg — ABNORMAL LOW (ref 26.0–34.0)
MCHC: 31.7 g/dL (ref 30.0–36.0)
MCV: 81.3 fL (ref 78.0–100.0)
Platelets: 245 K/uL (ref 150–400)
RBC: 5.13 MIL/uL — ABNORMAL HIGH (ref 3.87–5.11)
RDW: 15.2 % (ref 11.5–15.5)
WBC: 8.9 K/uL (ref 4.0–10.5)

## 2012-12-21 LAB — BASIC METABOLIC PANEL WITH GFR
BUN: 22 mg/dL (ref 6–23)
CO2: 23 meq/L (ref 19–32)
Calcium: 8.1 mg/dL — ABNORMAL LOW (ref 8.4–10.5)
Chloride: 110 meq/L (ref 96–112)
Creatinine, Ser: 1.54 mg/dL — ABNORMAL HIGH (ref 0.50–1.10)
GFR calc Af Amer: 36 mL/min — ABNORMAL LOW
GFR calc non Af Amer: 31 mL/min — ABNORMAL LOW
Glucose, Bld: 178 mg/dL — ABNORMAL HIGH (ref 70–99)
Potassium: 3.7 meq/L (ref 3.5–5.1)
Sodium: 142 meq/L (ref 135–145)

## 2012-12-21 LAB — PROTIME-INR: Prothrombin Time: 29.6 seconds — ABNORMAL HIGH (ref 11.6–15.2)

## 2012-12-21 MED ORDER — BISACODYL 10 MG RE SUPP: 10.0000 mg | Freq: Every day | RECTAL | Status: DC | PRN

## 2012-12-21 MED ORDER — DILTIAZEM HCL ER COATED BEADS 360 MG PO CP24: 360.0000 mg | ORAL_CAPSULE | Freq: Every day | ORAL | Status: DC

## 2012-12-21 MED ORDER — POLYETHYLENE GLYCOL 3350 17 G PO PACK
17.0000 g | PACK | Freq: Every day | ORAL | Status: DC | PRN
  Filled 2012-12-21: qty 1

## 2012-12-21 MED ORDER — OXYCODONE-ACETAMINOPHEN 5-325 MG PO TABS: 1.0000 | ORAL_TABLET | ORAL | Status: DC | PRN

## 2012-12-21 NOTE — Progress Notes (Signed)
Patient cleared for discharge. Packet copied and placed in [place]. ptar called for transportation. Patient and spouse at bedside understand that there is no guarantee of payment. Patient and spouse are thrilled that patient has a bed at [facility] and is ready to go today.  [place] Rollo C. Humphreys MSW, [place]

## 2012-12-21 NOTE — Clinical Social Work Placement (Signed)
     Clinical Social Work Department CLINICAL SOCIAL WORK PLACEMENT NOTE 12/21/2012  Patient:  [name], [name]  Account Number:  [id number] Admit date:  12/09/2012  Clinical Social Worker:  Caren Hazy, LCSW  Date/time:  12/21/2012 12:00 M  Clinical Social Work is seeking post-discharge placement for this patient at the following level of care:   [place]   (*CSW will update this form in Epic as items are completed)   12/21/2012  Patient/family provided with [place] Department of Clinical Social Works list of facilities offering this level of care within the geographic area requested by the patient (or if unable, by the patients family).  12/21/2012  Patient/family informed of their freedom to choose among providers that offer the needed level of care, that participate in Medicare, Medicaid or managed care program needed by the patient, have an available bed and are willing to accept the patient.  12/21/2012  Patient/family informed of [facility] ownership interest in [facility], as well as of the fact that they are under no obligation to receive care at this facility.  PASARR submitted to EDS on 12/21/2012 PASARR number received from EDS on 12/21/2012  FL2 transmitted to all facilities in geographic area requested by pt/family on  12/21/2012 FL2 transmitted to all facilities within larger geographic area on   Patient informed that his/her managed care company has contracts with or will negotiate with  certain facilities, including the following:     Patient/family informed of bed offers received:  12/21/2012 Patient chooses bed at [place] Physician recommends and patient chooses bed at    Patient to be transferred to [place] on  12/21/2012 Patient to be transferred to facility by ptar  The following physician request were entered in Epic:   Additional Comments:

## 2012-12-21 NOTE — Progress Notes (Signed)
Clinical Social Work  [employer] authorization # [id number]. CSW called and updated PTAR on authorization number as well.  Sindy Messing, LCSW (Coverage for [employer])

## 2012-12-21 NOTE — Discharge Summary (Signed)
Physician Discharge Summary  [name] [id number] DOB: 1934/02/19 DOA: 12/09/2012  PCP: Milagros Evener, MD  Admit date: 12/09/2012 Discharge date: 12/21/2012  Recommendations for Outpatient Follow-up:  1. The patient is being discharged to [facility] for further rehabilitation. 2. Recommend daily PT/INR checks until stable, with dosage adjustment of coumadin based on INR. 3. Recommend close F/U of BP as her BP medications have been changed.  Discharge Diagnoses:  Principal Problem:    SBO (small bowel obstruction) secondary to incarcerated umbilical hernia status post exploratory laparoscopy with lysis of adhesions and repair of ventral hernia Active Problems:    HTN (hypertension)    Dyslipidemia    Atrial fibrillation    Diabetes with renal complications    CKD (chronic kidney disease), stage III    Umbilical hernia, incarcerated     Ileus, postoperative    Chest tightness secondary to flash pulmonary edema    Hypernatremia   Discharge Condition: Improved.  Diet recommendation: Low sodium, heart healthy, carb modified.  History of present illness:  [name] is an 79 y.o. female with a PMH of diabetes, hypertension, chronic kidney disease, dyslipidemia, and atrial fibrillation who was admitted on 12/09/2012 with a small bowel obstruction and incarcerated ventral hernia who underwent exploratory laparoscopy with lysis of adhesions and repair of recurrent ventral hernia by Dr. Lilyan Punt on 12/13/2012. Her postoperative course was complicated by atrial fibrillation with rapid ventricular response necessitating transfer to the step down unit on 12/14/2012. Heart rate control was achieved with Cardizem drip followed by oral Cardizem, and as such her Norvasc was discontinued. She has made steady progress for the past several days and is now stable for discharge to a skilled nursing facility.  Hospital Course by problem:  Principal Problem:  SBO (small bowel  obstruction) secondary to incarcerated umbilical hernia  -Status post exploratory laparoscopy with lysis of adhesions and repair of recurrent ventral hernia 12/13/2012.  -NG tube removed 12/18/2012. Diet advanced to carb modified 12/19/2012. Tolerating well.  Active Problems:  Chest tightness secondary to flash pulmonary edema  -Pro BNP elevated at 3788. Troponins negative x3. 12-lead EKG showed afib with RVR but no acute changes.  -Given lasix 40 mg IV x 1 12/18/2012 with alleviation of symptoms.  -2-D echocardiogram done 12/15/2012 with EF 60-65% and no regional wall motion abnormalities or mention of diastolic dysfunction.  Hypernatremia  -IVF KVO'd secondary to chest tightness.  -Taking in oral liquids. Sodium coming down.  HTN (hypertension)  -Controlled on metoprolol, amiodarone and Cardizem.  Dyslipidemia  -Resume statin.  Atrial fibrillation  -Continue amiodarone, metoprolol (dose increased 12/19/12) and Cardizem.  -Heparin/Coumadin resumed 12/14/2012.  -Seen by cardiology 12/13/2012.  Diabetes with renal complications  -Hemoglobin 8.1%. Seen by diabetes coordinator.  -Managed with insulin long hospital, but can resume usual home regimen at discharge. CKD (chronic kidney disease), stage III  -Creatinine appears to be better than usual baseline. Creatinine was 1.7 in 2012.  Procedures:  Laparoscopic lysis of adhesions with laparoscopic repair of recurrent ventral hernia 12/12/2012  Medical Consultants:  Dr. Madilyn Hook, Surgery  Dr. Daneen Schick III, Cardiology   Discharge Exam: Filed Vitals:   12/21/12 0548  BP: 124/68  Pulse: 90  Temp: 98.1 F (36.7 C)  Resp: 18   Filed Vitals:   12/20/12 1336 12/20/12 1500 12/20/12 2106 12/21/12 0548  BP: 91/54 124/68 142/72 124/68  Pulse: 62  94 90  Temp: 97.7 F (36.5 C)  98.3 F (36.8 C) 98.1 F (36.7 C)  TempSrc: Oral  Oral Oral  Resp: 18  20 18   Height:      Weight:    114 kg (251 lb 5.2 oz)  SpO2: 100%  99% 97%    Gen:   NAD Cardiovascular:  Heart sounds are regular Respiratory: Lungs CTAB Gastrointestinal: Abdomen soft, NT/ND with normal active bowel sounds. Extremities: 2+ edema   Discharge Instructions  Discharge Orders   Future Appointments Provider Department Dept Phone   01/13/2013 10:00 AM [facility] [place] Office [phone]   02/01/2013 10:30 AM [facility] [place] Office [phone]   02/01/2013 11:00 AM Sinclair Grooms, MD [facility] [phone]   Future Orders Complete By Expires   Call MD for:  extreme fatigue  As directed    Call MD for:  persistant dizziness or light-headedness  As directed    Call MD for:  redness, tenderness, or signs of infection (pain, swelling, redness, odor or green/yellow discharge around incision site)  As directed    Call MD for:  temperature >100.4  As directed    Diet - low sodium heart healthy  As directed    Diet Carb Modified  As directed    Discharge instructions  As directed    Comments:     You were cared for by Dr. Jacquelynn Cree  (a hospitalist) during your hospital stay. If you have any questions about your discharge medications or the care you received while you were in the hospital after you are discharged, you can call the unit and ask to speak with the hospitalist on call if the hospitalist that took care of you is not available. Once you are discharged, your primary care physician will handle any further medical issues. Please note that NO REFILLS for any discharge medications will be authorized once you are discharged, as it is imperative that you return to your primary care physician (or establish a relationship with a primary care physician if you do not have one) for your aftercare needs so that they can reassess your need for medications and monitor your lab values.  Any outstanding tests can be reviewed by your PCP at your follow up visit.  It is also important to review  any medicine changes with your PCP.  Please bring these d/c instructions with you to your next visit so your physician can review these changes with you.  If you do not have a primary care physician, you can call [phone] for a physician referral.  It is highly recommended that you obtain a PCP for hospital follow up.   Increase activity slowly  As directed    No dressing needed  As directed    Scheduling Instructions:     Follow up with surgeon in 1-2 weeks.   Walk with assistance  As directed    Walker   As directed        Medication List    STOP taking these medications       amLODipine 10 MG tablet  Commonly known as:  NORVASC     doxazosin 4 MG tablet  Commonly known as:  CARDURA      TAKE these medications       acarbose 100 MG tablet  Commonly known as:  PRECOSE  Take 100 mg by mouth 3 (three) times daily with meals.     amiodarone 200 MG tablet  Commonly known as:  PACERONE  Take 200 mg by mouth daily.  atorvastatin 40 MG tablet  Commonly known as:  LIPITOR  Take 40 mg by mouth daily.     bisacodyl 10 MG suppository  Commonly known as:  DULCOLAX  Place 1 suppository (10 mg total) rectally daily as needed.     CALCIUM 1200 PO  Take 1,200 mg by mouth daily.     Cranberry 250 MG Caps  Take 250 mg by mouth daily.     diltiazem 360 MG 24 hr capsule  Commonly known as:  CARDIZEM CD  Take 1 capsule (360 mg total) by mouth daily.     econazole nitrate 1 % cream  Apply 1 application topically daily as needed (to groin rash as needed).     esomeprazole 20 MG capsule  Commonly known as:  NEXIUM  Take 40 mg by mouth daily before breakfast.     ferrous sulfate 325 (65 FE) MG tablet  Take 325 mg by mouth daily with breakfast.     fluticasone 50 MCG/ACT nasal spray  Commonly known as:  FLONASE  Place 2 sprays into the nose daily as needed for allergies.     furosemide 80 MG tablet  Commonly known as:  LASIX  Take 80 mg by mouth 3 (three) times daily.      glipiZIDE 10 MG 24 hr tablet  Commonly known as:  GLUCOTROL XL  Take 10 mg by mouth daily.     metolazone 5 MG tablet  Commonly known as:  ZAROXOLYN  Take 5 mg by mouth every Monday, Wednesday, and Friday.     metoprolol 50 MG tablet  Commonly known as:  LOPRESSOR  Take 50 mg by mouth 2 (two) times daily.     multivitamin with minerals Tabs tablet  Take 1 tablet by mouth daily.     oxyCODONE-acetaminophen 5-325 MG per tablet  Commonly known as:  PERCOCET/ROXICET  Take 1-2 tablets by mouth every 4 (four) hours as needed.     potassium chloride 10 MEQ tablet  Commonly known as:  K-DUR,KLOR-CON  Take 10 mEq by mouth 2 (two) times daily.     sitaGLIPtin 25 MG tablet  Commonly known as:  JANUVIA  Take 25 mg by mouth daily.     warfarin 5 MG tablet  Commonly known as:  COUMADIN  Take 5 mg by mouth daily with supper.           Follow-up Information   Follow up with LAYTON, [place], DO. Schedule an appointment as soon as possible for a visit in 2 weeks. (You can use the binder when you are up, as needed..  No lifting over 10 pounds (1gallon of milk) for 6 weeks.)    Specialty:  General Surgery   Contact information:   [address] [place] [address] [phone]        The results of significant diagnostics from this hospitalization (including imaging, microbiology, ancillary and laboratory) are listed below for reference.    Significant Diagnostic Studies: Ct Abdomen Pelvis Wo Contrast  12/09/2012   *RADIOLOGY REPORT*  Clinical Data: Lower pelvic pain  CT ABDOMEN AND PELVIS WITHOUT CONTRAST  Technique:  Multidetector CT imaging of the abdomen and pelvis was performed following the standard protocol without intravenous contrast.  Comparison: None.  Findings: Visualized lung bases appear normal.  Hepatic cyst is noted inferiorly in right hepatic lobe.  The spleen and pancreas appear normal.  No gallstones are noted.  Adrenal glands and kidneys appear  normal.  No renal or  ureteral calculi are noted. Urinary bladder appears normal.  Calcified fibroids are seen involving the uterus.  Phleboliths are noted in the pelvis. Sigmoid diverticulosis is noted without inflammation.  There is noted a ventral hernia in the right lower quadrant of the abdomen which contains a loop of incarcerated terminal ileum and results in partial obstruction of the more proximal small bowel.  Wall thickening and inflammatory changes are seen involving the more distal terminal ileum extending to the ileocecal valve.  No abnormal fluid collection is noted.  Contrast is noted within the colon as well as stool.  IMPRESSION: Ventral hernia is noted in the right lower quadrant of the anterior abdominal wall which contains a loop of incarcerated terminal ileum, resulting in partial small bowel obstruction and dilatation of the more proximal small bowel.   Original Report Authenticated By: Marijo Conception.,  M.D.   Dg Chest Port 1 View  12/18/2012   CLINICAL DATA:  Mid chest pain. History of hypertension and diabetes.  EXAM: PORTABLE CHEST - 1 VIEW  COMPARISON:  12/09/2012  FINDINGS: Since the prior study, opacity has developed at the lung bases obscuring the hemidiaphragms likely a combination of pleural effusions with either atelectasis, infiltrate or a combination.  There is no pulmonary edema.  Cardiac silhouette is mostly obscured. No mediastinal or hilar masses are noted. There is no pneumothorax.  IMPRESSION: 1. New lung base opacity likely due to a combination of pleural effusions with atelectasis. Infiltrate is possible. No pulmonary edema.   Electronically Signed   By: Lajean Manes   On: 12/18/2012 16:07   Dg Abd 2 Views  12/10/2012   CLINICAL DATA:  Evaluate for small bowel obstruction. Periumbilical hernia with prior surgery. Now with protruding hernia right abdominal region and occasional constipation.  EXAM: ABDOMEN - 2 VIEW  COMPARISON:  12/09/2012 CT.  FINDINGS: Small bowel  obstructive pattern with contrast seen in portions of the colon. Gas distended small bowel loops measuring up to 5 cm with thickened folds.  No free intraperitoneal air detected.  IMPRESSION: Small bowel obstructive pattern with contrast seen in portions of the colon. Gas distended small bowel loops measuring up to 5 cm with thickened folds. No free intraperitoneal air detected.   Electronically Signed   By: Chauncey Cruel   On: 12/10/2012 10:36   Dg Abd Acute W/chest  12/09/2012   CLINICAL DATA:  Abdominal pain, hypertension.  EXAM: ACUTE ABDOMEN SERIES (ABDOMEN 2 VIEW & CHEST 1 VIEW)  COMPARISON:  07/27/2012  FINDINGS: Dilated small bowel loops with scattered air-fluid levels in the abdomen. Findings concerning for small bowel obstruction. Calcifications in the center of the pelvis, likely calcified fibroids. No organomegaly. No free air.  IMPRESSION: Dilated small bowel loops with scattered air-fluid levels concerning for small bowel obstruction.   Electronically Signed   By: Rolm Baptise M.D.   On: 12/09/2012 12:22   Dg Abd Portable 1v  12/10/2012   *RADIOLOGY REPORT*  Clinical Data: NG tube placement.  PORTABLE ABDOMEN - 1 VIEW 5:49 p.m.  Comparison: 12/10/2012 at 9:24 a.m.  Findings: NG tube has been inserted and the tip is in the proximal stomach just below the gastroesophageal junction.  There is air throughout the large and small bowel including slightly distended loops of small bowel in the lower abdomen.  Degenerative changes are present throughout the spine.  IMPRESSION: NG tube tip is in the upper stomach just beyond the gastroesophageal junction.  Persistent slight distention of small bowel.  Contrast has  passed into the colon.   Original Report Authenticated By: Lorriane Shire, M.D.    Labs:  Basic Metabolic Panel:  Recent Labs Lab 12/16/12 0325 12/17/12 0340 12/18/12 0620 12/19/12 0320 12/21/12 0505  NA 152* 154* 150* 147* 142  K 3.6 4.1 4.0 3.8 3.7  CL 119* 121* 118* 115* 110   CO2 25 23 21 21 23   GLUCOSE 167* 159* 104* 144* 178*  BUN 28* 26* 24* 25* 22  CREATININE 1.51* 1.35* 1.45* 1.52* 1.54*  CALCIUM 9.4 9.1 8.6 8.3* 8.1*   GFR Estimated Creatinine Clearance: 39.2 ml/min (by C-G formula based on Cr of 1.54).  Coagulation profile  Recent Labs Lab 12/17/12 0340 12/18/12 0620 12/19/12 0320 12/20/12 0445 12/21/12 0505  INR 1.76* 2.31* 2.48* 2.72* 2.94*    CBC:  Recent Labs Lab 12/17/12 0340 12/18/12 0620 12/19/12 0320 12/20/12 0445 12/21/12 0505  WBC 7.0 6.1 5.6 8.3 8.9  HGB 13.8 12.8 13.4 13.4 13.2  HCT 43.3 41.2 41.5 41.7 41.7  MCV 81.2 81.9 81.7 81.3 81.3  PLT 182 204 211 219 245   Cardiac Enzymes:  Recent Labs Lab 12/18/12 1527 12/18/12 2101 12/19/12 0320  TROPONINI <0.30 <0.30 <0.30   CBG:  Recent Labs Lab 12/19/12 2214 12/20/12 0731 12/20/12 1217 12/20/12 1700 12/20/12 2147  GLUCAP 222* 144* 199* 153* 181*   Microbiology Recent Results (from the past 240 hour(s))  CULTURE, BLOOD (ROUTINE X 2)     Status: None   Collection Time    12/13/12 12:12 PM      Result Value Range Status   Specimen Description BLOOD LEFT HAND   Final   Special Requests BOTTLES DRAWN AEROBIC AND ANAEROBIC [facility]   Final   Culture  Setup Time     Final   Value: 12/13/2012 15:44     Performed at [facility]   Culture     Final   Value: NO GROWTH 5 DAYS     Performed at [facility]   Report Status 12/19/2012 FINAL   Final  CULTURE, BLOOD (ROUTINE X 2)     Status: None   Collection Time    12/13/12 12:20 PM      Result Value Range Status   Specimen Description BLOOD LEFT HAND   Final   Special Requests BOTTLES DRAWN AEROBIC AND ANAEROBIC 4CC   Final   Culture  Setup Time     Final   Value: 12/13/2012 15:44     Performed at [facility]   Culture     Final   Value: NO GROWTH 5 DAYS     Performed at [facility]   Report Status 12/19/2012 FINAL   Final  MRSA PCR SCREENING     Status: None   Collection  Time    12/14/12  4:04 PM      Result Value Range Status   MRSA by PCR NEGATIVE  NEGATIVE Final   Comment:            The GeneXpert MRSA Assay (FDA     approved for NASAL specimens     only), is one component of a     comprehensive MRSA colonization     surveillance program. It is not     intended to diagnose MRSA     infection nor to guide or     monitor treatment for     MRSA infections.    Time coordinating discharge: 35 minutes.  Signed:  Elzina Devera  Pager [phone]  [facility] 12/21/2012, 8:39 AM

## 2012-12-21 NOTE — Progress Notes (Signed)
[employer] auth submitted.  Velicia Dejager C. [place] MSW, [place]

## 2012-12-21 NOTE — Progress Notes (Signed)
Coumadin and other meds adm as ordered. Epic system down intermittently.

## 2012-12-21 NOTE — Progress Notes (Signed)
ANTICOAGULATION CONSULT NOTE - Follow Up Consult  Pharmacy Consult for: Warfarin Indication: atrial fibrillation  Patient Measurements: Height: 5\' 6"  (167.6 cm) Weight: 251 lb 5.2 oz (114 kg) IBW/kg (Calculated) : 59.3 Heparin Dosing Weight: 84 kg  Labs:  Recent Labs  12/18/12 1527 12/18/12 2101  12/19/12 0320 12/20/12 0445 12/21/12 0505  HGB  --   --   < > 13.4 13.4 13.2  HCT  --   --   --  41.5 41.7 41.7  PLT  --   --   --  211 219 245  LABPROT  --   --   --  26.0* 27.9* 29.6*  INR  --   --   --  2.48* 2.72* 2.94*  CREATININE  --   --   --  1.52*  --  1.54*  TROPONINI <0.30 <0.30  --  <0.30  --   --   < > = values in this interval not displayed.  Assessment: [name] on chronic warfarin therapy PTA for atrial fibrillation presented 9/18 with ventral hernia/SBO/incarcerated SB.  Warfarin was placed on hold (last dose given 9/18) and heparin bridge started 9/19. Underwent lap ventral hernia repair on 12/12/12.  Heparin resumed 8 hours post-op.  Warfarin resumed 9/23.  Warfarin dose PTA reported as 5 mg daily.  Heparin discontinued 9/27 as INR had reached therapeutic level.  INR (2.91) remains therapeutic, but continues trending up.  Note:  9/29 warfarin dose was not charted initially, but RN confirms dose of warfarin 2mg  given yesterday.  Carb modified diet started with only 30% of one meal charted.   CBC:  Hgb and Plt stable/wnl.  No bleeding/issues reported   Noted drug interaction with Amiodarone, continued from PTA.  Goal of Therapy:  INR 2-3 Heparin level 0.3-0.7 units/ml Monitor platelets by anticoagulation protocol: Yes   Plan:   Warfarin 1 mg PO tonight at 1800.  Recommend daily INR checks until warfarin stabilized (seems to be requiring lower warfarin doses.)   Gretta Arab PharmD, BCPS Pager [phone] 12/21/2012 9:24 AM

## 2012-12-21 NOTE — Progress Notes (Signed)
9 Days Post-Op  Subjective: Her stomach was upset yesterday, but she is eating normally this morning.  No BM yesterday.  Objective: Vital signs in last 24 hours: Temp:  [97.7 F (36.5 C)-98.3 F (36.8 C)] 98 F (36.7 C) (09/30 0800) Pulse Rate:  [62-96] 96 (09/30 0800) Resp:  [18-20] 18 (09/30 0548) BP: (91-142)/(54-72) 134/67 mmHg (09/30 0800) SpO2:  [97 %-100 %] 97 % (09/30 0800) Weight:  [114 kg (251 lb 5.2 oz)] 114 kg (251 lb 5.2 oz) (09/30 0548) Last BM Date: 12/19/12 360 PO recorded  No BM yesterday Afebrile, VSS Creatinine is stable, INR 2.94 Intake/Output from previous day: 09/29 0701 - 09/30 0700 In: 360 [P.O.:360] Out: 875 [Urine:875] Intake/Output this shift:    General appearance: alert, cooperative and no distress GI: soft, non-tender; bowel sounds normal; no masses,  no organomegaly and incisions look good  Lab Results:   Recent Labs  12/20/12 0445 12/21/12 0505  WBC 8.3 8.9  HGB 13.4 13.2  HCT 41.7 41.7  PLT 219 245    BMET  Recent Labs  12/19/12 0320 12/21/12 0505  NA 147* 142  K 3.8 3.7  CL 115* 110  CO2 21 23  GLUCOSE 144* 178*  BUN 25* 22  CREATININE 1.52* 1.54*  CALCIUM 8.3* 8.1*   PT/INR  Recent Labs  12/20/12 0445 12/21/12 0505  LABPROT 27.9* 29.6*  INR 2.72* 2.94*    No results found for this basename: AST, ALT, ALKPHOS, BILITOT, PROT, ALBUMIN,  in the last 168 hours   Lipase     Component Value Date/Time   LIPASE 26 12/09/2012 1038     Studies/Results: No results found.  Medications: . amiodarone  200 mg Oral Daily  . atorvastatin  40 mg Oral q1800  . diltiazem  360 mg Oral Daily  . guaiFENesin  600 mg Oral BID  . insulin aspart  0-5 Units Subcutaneous QHS  . insulin aspart  0-9 Units Subcutaneous TID WC  . metoprolol succinate  12.5 mg Oral BID  . warfarin  2 mg Oral ONCE-1800  . Warfarin - Pharmacist Dosing Inpatient   Does not apply q1800    Assessment/Plan 1. Ventral hernia/SBO/incarcerated SB; s/p  Laparoscopic lysis of adhesions with laparoscopic repair of recurrent ventral hernia. 12/13/2012, Madilyn Hook, DO.  Post op ileus  2. Afib on chronic coumadin/amiodarone now with post op RVR  3. Hypertension  5. AODM  6. Chronic renal disease  7. Tobacco use 40 years  8. Dyslipidemia  9.Hx of right breast cancer with lumpectomy and radiation Rx.  10. BMI 38  11. Anemia on FE      Plan:  i will leave her some miralax for BM prn.  She is going to SNF at D/C.   LOS: 12 days    Jorja Empie 12/21/2012

## 2012-12-22 ENCOUNTER — Non-Acute Institutional Stay (SKILLED_NURSING_FACILITY): Payer: Medicare Other | Admitting: Adult Health

## 2012-12-22 DIAGNOSIS — E785 Hyperlipidemia, unspecified: Secondary | ICD-10-CM

## 2012-12-22 DIAGNOSIS — E1129 Type 2 diabetes mellitus with other diabetic kidney complication: Secondary | ICD-10-CM

## 2012-12-22 DIAGNOSIS — K219 Gastro-esophageal reflux disease without esophagitis: Secondary | ICD-10-CM

## 2012-12-22 DIAGNOSIS — B372 Candidiasis of skin and nail: Secondary | ICD-10-CM

## 2012-12-22 DIAGNOSIS — N183 Chronic kidney disease, stage 3 unspecified: Secondary | ICD-10-CM

## 2012-12-22 DIAGNOSIS — J811 Chronic pulmonary edema: Secondary | ICD-10-CM

## 2012-12-22 DIAGNOSIS — I4891 Unspecified atrial fibrillation: Secondary | ICD-10-CM

## 2012-12-22 DIAGNOSIS — N058 Unspecified nephritic syndrome with other morphologic changes: Secondary | ICD-10-CM

## 2012-12-22 DIAGNOSIS — K56609 Unspecified intestinal obstruction, unspecified as to partial versus complete obstruction: Secondary | ICD-10-CM

## 2012-12-22 DIAGNOSIS — I10 Essential (primary) hypertension: Secondary | ICD-10-CM

## 2012-12-22 LAB — GLUCOSE, CAPILLARY
Glucose-Capillary: 153 mg/dL — ABNORMAL HIGH (ref 70–99)
Glucose-Capillary: 175 mg/dL — ABNORMAL HIGH (ref 70–99)

## 2012-12-23 DIAGNOSIS — E785 Hyperlipidemia, unspecified: Secondary | ICD-10-CM

## 2012-12-23 DIAGNOSIS — B372 Candidiasis of skin and nail: Secondary | ICD-10-CM

## 2012-12-23 DIAGNOSIS — K219 Gastro-esophageal reflux disease without esophagitis: Secondary | ICD-10-CM | POA: Insufficient documentation

## 2012-12-23 DIAGNOSIS — E1129 Type 2 diabetes mellitus with other diabetic kidney complication: Secondary | ICD-10-CM | POA: Insufficient documentation

## 2012-12-23 DIAGNOSIS — J811 Chronic pulmonary edema: Secondary | ICD-10-CM | POA: Insufficient documentation

## 2012-12-23 HISTORY — DX: Gastro-esophageal reflux disease without esophagitis: K21.9

## 2012-12-23 HISTORY — DX: Candidiasis of skin and nail: B37.2

## 2012-12-23 HISTORY — DX: Type 2 diabetes mellitus with other diabetic kidney complication: E11.29

## 2012-12-23 HISTORY — DX: Hyperlipidemia, unspecified: E78.5

## 2012-12-23 NOTE — Progress Notes (Signed)
Patient ID: [name], female   DOB: 30-Dec-1933, 79 y.o.   MRN: [id number]       PROGRESS NOTE  DATE: 12/22/2012  FACILITY: Nursing Home Location: [facility]  LEVEL OF CARE: SNF (31)  Acute Visit  CHIEF COMPLAINT:  Follow-up hospitalization  HISTORY OF PRESENT ILLNESS: This is a 79 year old female who has been admitted to [facility] on 12/21/12 from [facility] with Small Bowel Obstruction secondary to incarcerated umbilical hernia S/P Exploratory Laparoscopy with lysis of adhesions and repair of recurrent ventral hernia. She has been admitted for a short-term rehabilitation.  REASSESSMENT OF ONGOING PROBLEM(S):  ATRIAL FIBRILLATION: the patients atrial fibrillation remains stable.  The patient denies DOE, tachycardia, orthopnea, transient neurological sx, pedal edema, palpitations, & PNDs.  No complications noted from the medications currently being used.  DM:pt's DM remains stable.  Pt denies polyuria, polydipsia, polyphagia, changes in vision or hypoglycemic episodes.  No complications noted from the medication presently being used.   9/14  hemoglobin A1c 8.1  HTN: Pt 's HTN remains stable.  Denies CP, sob, DOE, pedal edema, headaches, dizziness or visual disturbances.  No complications from the medications currently being used. Last BP : 124/76  PAST MEDICAL HISTORY : Reviewed.  No changes.  CURRENT MEDICATIONS: Reviewed per [facility]  REVIEW OF SYSTEMS:  GENERAL: no change in appetite, no fatigue, no weight changes, no fever, chills or weakness RESPIRATORY: no cough, SOB, DOE, wheezing, hemoptysis CARDIAC: no chest pain, or palpitations, +edema GI: no abdominal pain, diarrhea, constipation, heart burn, nausea or vomiting  PHYSICAL EXAMINATION  VS:  T97       P90      RR18      BP124/76     POX96 %       GENERAL: no acute distress, normal body habitus SKIN:  Erythematous rashes on bilateral  abdominal folds and groin EYES: conjunctivae normal,  sclerae normal, normal eye lids NECK: supple, trachea midline, no neck masses, no thyroid tenderness, no thyromegaly LYMPHATICS: no LAN in the neck, no supraclavicular LAN RESPIRATORY: breathing is even & unlabored, BS CTAB CARDIAC: RRR, no murmur,no extra heart sounds,BLE edema, 2+ GI: abdomen soft, normal BS, no masses,, no hepatomegaly, no splenomegaly, multiple dried puncture wounds on abdomen PSYCHIATRIC: the patient is alert & oriented to person, affect & behavior appropriate  LABS/RADIOLOGY: 12/21/12 sodium 142 potassium 3.7 glucose 178 BUN 22 creatinine one 0.54 calcium 8.1 WBC 8.9 hemoglobin 13.2 hematocrit 41.7 platelet 245    ASSESSMENT/PLAN:  Small bowel obstruction status post exploratory laparoscopy - for rehabilitation   Diabetes mellitus with renal complications - stable; continue Glipizide, Acarbose and Januvia  Atrial fibrillation - rate controlled  Hyperlipidemia - continue Lipitor  GERD - stable  CKD,  stage III - stable; will monitor  renal function  Pulmonary edema - stable  Hypertension - well controlled  Candidal Skin infection - start nystatin 100,000 units/gram powder to rashes on by date of abdominal folds and groin twice a day x3 weeks     CPT CODE: 13086

## 2012-12-28 ENCOUNTER — Encounter: Payer: Self-pay | Admitting: Adult Health

## 2013-01-29 ENCOUNTER — Encounter: Payer: Self-pay | Admitting: Interventional Cardiology

## 2013-02-01 ENCOUNTER — Ambulatory Visit: Payer: Medicare Other | Admitting: Interventional Cardiology

## 2013-02-01 ENCOUNTER — Other Ambulatory Visit: Payer: Medicare Other

## 2013-02-02 ENCOUNTER — Telehealth: Payer: Self-pay | Admitting: Interventional Cardiology

## 2013-02-02 NOTE — Telephone Encounter (Signed)
returned pt call. adv pt that I dont show any record of our office calling her.pt had an appt with Dr.Smith 02/01/13 and rescheduled for 02/22/13. pt was already aware of new appt

## 2013-02-02 NOTE — Telephone Encounter (Signed)
New problem:  Pt states she missed a call and she thinks it may have been the nurse calling her. Please advise

## 2013-02-22 ENCOUNTER — Ambulatory Visit
Admission: RE | Admit: 2013-02-22 | Discharge: 2013-02-22 | Disposition: A | Discharge status: Home / Self Care | Payer: Medicare Other | Source: Ambulatory Visit | Attending: Interventional Cardiology | Admitting: Interventional Cardiology

## 2013-02-22 ENCOUNTER — Other Ambulatory Visit: Payer: Medicare Other

## 2013-02-22 ENCOUNTER — Ambulatory Visit (INDEPENDENT_AMBULATORY_CARE_PROVIDER_SITE_OTHER): Payer: Medicare Other | Admitting: Pharmacist

## 2013-02-22 ENCOUNTER — Encounter: Payer: Self-pay | Admitting: Interventional Cardiology

## 2013-02-22 ENCOUNTER — Ambulatory Visit (INDEPENDENT_AMBULATORY_CARE_PROVIDER_SITE_OTHER): Payer: Medicare Other | Admitting: Interventional Cardiology

## 2013-02-22 VITALS — BP 130/65 | HR 54 | Ht 66.0 in | Wt 219.0 lb

## 2013-02-22 DIAGNOSIS — I4891 Unspecified atrial fibrillation: Secondary | ICD-10-CM

## 2013-02-22 DIAGNOSIS — E119 Type 2 diabetes mellitus without complications: Secondary | ICD-10-CM

## 2013-02-22 DIAGNOSIS — Z7901 Long term (current) use of anticoagulants: Secondary | ICD-10-CM

## 2013-02-22 DIAGNOSIS — Z79899 Other long term (current) drug therapy: Secondary | ICD-10-CM

## 2013-02-22 DIAGNOSIS — N183 Chronic kidney disease, stage 3 unspecified: Secondary | ICD-10-CM

## 2013-02-22 HISTORY — DX: Other long term (current) drug therapy: Z79.899

## 2013-02-22 HISTORY — DX: Long term (current) use of anticoagulants: Z79.01

## 2013-02-22 LAB — HEPATIC FUNCTION PANEL
ALT: 51 U/L — ABNORMAL HIGH (ref 0–35)
AST: 55 U/L — ABNORMAL HIGH (ref 0–37)
Albumin: 3.5 g/dL (ref 3.5–5.2)
Alkaline Phosphatase: 42 U/L (ref 39–117)
Bilirubin, Direct: 0.1 mg/dL (ref 0.0–0.3)
Total Bilirubin: 0.8 mg/dL (ref 0.3–1.2)
Total Protein: 6.6 g/dL (ref 6.0–8.3)

## 2013-02-22 LAB — POCT INR: INR: >8

## 2013-02-22 LAB — TSH: TSH: 2.02 u[IU]/mL (ref 0.35–5.50)

## 2013-02-22 LAB — PROTIME-INR: Prothrombin Time: 84.8 s (ref 10.2–12.4)

## 2013-02-22 NOTE — Patient Instructions (Signed)
Your physician recommends that you continue on your current medications as directed. Please refer to the Current Medication list given to you today.  Labs today: Tsh, Hepatic  A chest x-ray takes a picture of the organs and structures inside the chest, including the heart, lungs, and blood vessels. This test can show several things, including, whether the heart is enlarges; whether fluid is building up in the lungs; and whether pacemaker / defibrillator leads are still in place.( to be done at [facility])  Your physician wants you to follow-up in: 6 months You will receive a reminder letter in the mail two months in advance. If you don't receive a letter, please call our office to schedule the follow-up appointment.( repeat Tsh,hepatic, chest xray)  You will be seen by the coumadin clinic today

## 2013-02-22 NOTE — Progress Notes (Signed)
Patient ID: [name], female   DOB: 1933/10/23, 79 y.o.   MRN: [id number]    [address] [place], [place]  [address] Phone: [phone] Fax:  [phone]  Date:  02/22/2013   ID:  [name], DOB May 23, 1933, MRN [id number]  PCP:  Milagros Evener, MD   ASSESSMENT:  1. Paroxysmal atrial flutter/fibrillation 2. Hypertension 3. Chronic anticoagulation therapy 4. Chronic amiodarone therapy  PLAN:  1. Patient needs to go to the Coumadin clinic immediately as she has not had an INR  done in greater than 6 weeks. 2. No clinical evidence of amiodarone toxicity 3. Clinical followup in 6 months   SUBJECTIVE: [name] is a 79 y.o. female who had a recent orthopedic surgery. She has been in a rehabilitation center. She tells me as a side conversation that she has not had an INR performed since being discharged from the skilled nursing facility. She denies cardiopulmonary complaints. She specifically denies dyspnea, orthopnea, PND, and other complaints.   Wt Readings from Last 3 Encounters:  02/22/13 219 lb (99.338 kg)  12/21/12 251 lb 5.2 oz (114 kg)  12/21/12 251 lb 5.2 oz (114 kg)     Past Medical History  Diagnosis Date  . Hypertension   . Diabetes mellitus without complication   . Irregular heart beat   . Renal disorder   . Ileus, postoperative 12/17/2012    Current Outpatient Prescriptions  Medication Sig Dispense Refill  . acarbose (PRECOSE) 100 MG tablet Take 100 mg by mouth 3 (three) times daily with meals.      Marland Kitchen amiodarone (PACERONE) 200 MG tablet Take 200 mg by mouth daily.      Marland Kitchen amLODipine (NORVASC) 10 MG tablet       . atorvastatin (LIPITOR) 40 MG tablet Take 40 mg by mouth daily.      . bisacodyl (DULCOLAX) 10 MG suppository Place 1 suppository (10 mg total) rectally daily as needed.  12 suppository  0  . Calcium Carbonate-Vit D-Min (CALCIUM 1200 PO) Take 1,200 mg by mouth daily.      . Cranberry 250 MG CAPS Take 250 mg by mouth  daily.      Marland Kitchen diltiazem (CARDIZEM CD) 360 MG 24 hr capsule Take 1 capsule (360 mg total) by mouth daily.      Marland Kitchen doxazosin (CARDURA) 4 MG tablet Take 1/2 tab daily      . econazole nitrate 1 % cream Apply 1 application topically daily as needed (to groin rash as needed).      Marland Kitchen esomeprazole (NEXIUM) 20 MG capsule Take 40 mg by mouth daily before breakfast.      . ferrous sulfate 325 (65 FE) MG tablet Take 325 mg by mouth daily with breakfast.      . fluticasone (FLONASE) 50 MCG/ACT nasal spray Place 2 sprays into the nose daily as needed for allergies.      . furosemide (LASIX) 80 MG tablet Take 80 mg by mouth 3 (three) times daily.      Marland Kitchen glipiZIDE (GLUCOTROL XL) 10 MG 24 hr tablet Take 10 mg by mouth daily.      . metolazone (ZAROXOLYN) 5 MG tablet Take 5 mg by mouth every Monday, Wednesday, and Friday.      . metoprolol (LOPRESSOR) 50 MG tablet Take 50 mg by mouth 2 (two) times daily.      . Multiple Vitamin (MULTIVITAMIN WITH MINERALS) TABS tablet Take 1 tablet by mouth daily.      Marland Kitchen  oxyCODONE-acetaminophen (PERCOCET/ROXICET) 5-325 MG per tablet Take 1-2 tablets by mouth every 4 (four) hours as needed.  30 tablet  0  . potassium chloride (K-DUR,KLOR-CON) 10 MEQ tablet Take 10 mEq by mouth 2 (two) times daily.      . sitaGLIPtin (JANUVIA) 25 MG tablet Take 25 mg by mouth daily.      Marland Kitchen warfarin (COUMADIN) 5 MG tablet Take 5 mg by mouth daily with supper.       No current facility-administered medications for this visit.    Allergies:    Allergies  Allergen Reactions  . Lotensin [Benazepril Hcl] Anaphylaxis  . Zantac [Ranitidine Hcl] Anaphylaxis    Social History:  The patient  reports that she has quit smoking. Her smoking use included Cigarettes. She smoked 0.00 packs per day. She has never used smokeless tobacco. She reports that she does not drink alcohol or use illicit drugs.   ROS:  Please see the history of present illness.   No bleeding. No recent falls. Denies chest pain. No  neurological complaints.   All other systems reviewed and negative.   OBJECTIVE: VS:  BP 130/65  Pulse 54  Ht 5\' 6"  (1.676 m)  Wt 219 lb (99.338 kg)  BMI 35.36 kg/m2 Well nourished, well developed, in no acute distress, obese HEENT: normal Neck: JVD flat. Carotid bruit absent  Cardiac:  normal S1, S2; RRR; no murmur Lungs:  clear to auscultation bilaterally, no wheezing, rhonchi or rales Abd: soft, nontender, no hepatomegaly Ext: Edema 2+ bilateral. Pulses absent Skin: warm and dry Neuro:  CNs 2-12 intact, no focal abnormalities noted  EKG:  Normal sinus rhythm with moderate left ventricular hypertrophy       Signed, Illene Labrador III, MD 02/22/2013 11:52 AM

## 2013-02-23 ENCOUNTER — Telehealth: Payer: Self-pay

## 2013-02-23 NOTE — Telephone Encounter (Signed)
pt aware of lab results.Thyroid and liver tests are stable.pt verbalized understanding.

## 2013-02-23 NOTE — Telephone Encounter (Signed)
Message copied by Lamar Laundry on Wed Feb 23, 2013  5:28 PM ------      Message from: Daneen Schick      Created: Tue Feb 22, 2013  5:28 PM       Thyroid and liver tests are stable. ------

## 2013-02-23 NOTE — Telephone Encounter (Signed)
Message copied by Lamar Laundry on Wed Feb 23, 2013  5:33 PM ------      Message from: Daneen Schick      Created: Tue Feb 22, 2013  5:34 PM       Clear lung fields with no evidence of toxicity ------

## 2013-02-23 NOTE — Telephone Encounter (Signed)
pt given chest xray results.Clear lung fields with no evidence of toxicity .pt verbalized understanding.

## 2013-02-25 ENCOUNTER — Ambulatory Visit (INDEPENDENT_AMBULATORY_CARE_PROVIDER_SITE_OTHER): Payer: Medicare Other | Admitting: Pharmacist

## 2013-02-25 DIAGNOSIS — I4891 Unspecified atrial fibrillation: Secondary | ICD-10-CM

## 2013-03-04 ENCOUNTER — Ambulatory Visit (INDEPENDENT_AMBULATORY_CARE_PROVIDER_SITE_OTHER): Payer: Medicare Other | Admitting: Pharmacist

## 2013-03-04 DIAGNOSIS — I4891 Unspecified atrial fibrillation: Secondary | ICD-10-CM

## 2013-03-04 LAB — POCT INR: INR: 2

## 2013-03-10 ENCOUNTER — Ambulatory Visit (INDEPENDENT_AMBULATORY_CARE_PROVIDER_SITE_OTHER): Payer: Medicare Other | Admitting: *Deleted

## 2013-03-10 DIAGNOSIS — I4891 Unspecified atrial fibrillation: Secondary | ICD-10-CM

## 2013-03-10 LAB — POCT INR: INR: 2.1

## 2013-03-25 ENCOUNTER — Ambulatory Visit (INDEPENDENT_AMBULATORY_CARE_PROVIDER_SITE_OTHER): Payer: Medicare Other | Admitting: Pharmacist

## 2013-03-25 DIAGNOSIS — I4891 Unspecified atrial fibrillation: Secondary | ICD-10-CM

## 2013-03-25 LAB — POCT INR: INR: 1.7

## 2013-04-08 ENCOUNTER — Ambulatory Visit (INDEPENDENT_AMBULATORY_CARE_PROVIDER_SITE_OTHER): Payer: Medicare Other | Admitting: Pharmacist

## 2013-04-08 DIAGNOSIS — I4891 Unspecified atrial fibrillation: Secondary | ICD-10-CM

## 2013-04-08 LAB — POCT INR: INR: 2.6

## 2013-04-12 ENCOUNTER — Other Ambulatory Visit: Payer: Self-pay | Admitting: *Deleted

## 2013-04-12 MED ORDER — WARFARIN SODIUM 5 MG PO TABS: 5.0000 mg | ORAL_TABLET | ORAL | Status: DC

## 2013-04-29 ENCOUNTER — Ambulatory Visit (INDEPENDENT_AMBULATORY_CARE_PROVIDER_SITE_OTHER): Payer: Medicare Other | Admitting: *Deleted

## 2013-04-29 DIAGNOSIS — Z5181 Encounter for therapeutic drug level monitoring: Secondary | ICD-10-CM

## 2013-04-29 DIAGNOSIS — I4891 Unspecified atrial fibrillation: Secondary | ICD-10-CM

## 2013-04-29 LAB — POCT INR: INR: 2.5

## 2013-05-02 ENCOUNTER — Other Ambulatory Visit: Payer: Self-pay

## 2013-05-02 DIAGNOSIS — Z853 Personal history of malignant neoplasm of breast: Secondary | ICD-10-CM

## 2013-05-02 DIAGNOSIS — Z1231 Encounter for screening mammogram for malignant neoplasm of breast: Secondary | ICD-10-CM

## 2013-05-02 DIAGNOSIS — Z9889 Other specified postprocedural states: Secondary | ICD-10-CM

## 2013-05-20 NOTE — Progress Notes (Signed)
This encounter was created in error - please disregard.

## 2013-05-24 ENCOUNTER — Ambulatory Visit
Admission: RE | Admit: 2013-05-24 | Discharge: 2013-05-24 | Disposition: A | Discharge status: Home / Self Care | Payer: Self-pay | Source: Ambulatory Visit

## 2013-05-24 DIAGNOSIS — Z9889 Other specified postprocedural states: Secondary | ICD-10-CM

## 2013-05-24 DIAGNOSIS — Z1231 Encounter for screening mammogram for malignant neoplasm of breast: Secondary | ICD-10-CM

## 2013-05-24 DIAGNOSIS — Z853 Personal history of malignant neoplasm of breast: Secondary | ICD-10-CM

## 2013-05-30 ENCOUNTER — Other Ambulatory Visit: Payer: Self-pay | Admitting: *Deleted

## 2013-05-30 MED ORDER — AMIODARONE HCL 200 MG PO TABS: 200.0000 mg | ORAL_TABLET | Freq: Every day | ORAL | Status: DC

## 2013-06-01 ENCOUNTER — Ambulatory Visit (INDEPENDENT_AMBULATORY_CARE_PROVIDER_SITE_OTHER): Payer: Medicare Other | Admitting: *Deleted

## 2013-06-01 DIAGNOSIS — I4891 Unspecified atrial fibrillation: Secondary | ICD-10-CM

## 2013-06-01 DIAGNOSIS — Z5181 Encounter for therapeutic drug level monitoring: Secondary | ICD-10-CM

## 2013-06-01 LAB — POCT INR: INR: 1.4

## 2013-06-17 ENCOUNTER — Ambulatory Visit (INDEPENDENT_AMBULATORY_CARE_PROVIDER_SITE_OTHER): Payer: Medicare Other | Admitting: Pharmacist

## 2013-06-17 DIAGNOSIS — I4891 Unspecified atrial fibrillation: Secondary | ICD-10-CM

## 2013-06-17 DIAGNOSIS — Z5181 Encounter for therapeutic drug level monitoring: Secondary | ICD-10-CM

## 2013-06-17 LAB — POCT INR: INR: 1.7

## 2013-07-01 ENCOUNTER — Ambulatory Visit (INDEPENDENT_AMBULATORY_CARE_PROVIDER_SITE_OTHER): Payer: Medicare Other | Admitting: Pharmacist

## 2013-07-01 DIAGNOSIS — Z5181 Encounter for therapeutic drug level monitoring: Secondary | ICD-10-CM

## 2013-07-01 DIAGNOSIS — I4891 Unspecified atrial fibrillation: Secondary | ICD-10-CM

## 2013-07-01 LAB — POCT INR: INR: 2.4

## 2013-07-22 ENCOUNTER — Ambulatory Visit (INDEPENDENT_AMBULATORY_CARE_PROVIDER_SITE_OTHER): Payer: Medicare Other | Admitting: Pharmacist

## 2013-07-22 DIAGNOSIS — I4891 Unspecified atrial fibrillation: Secondary | ICD-10-CM

## 2013-07-22 DIAGNOSIS — Z5181 Encounter for therapeutic drug level monitoring: Secondary | ICD-10-CM

## 2013-07-22 LAB — POCT INR: INR: 1.8

## 2013-08-05 ENCOUNTER — Ambulatory Visit (INDEPENDENT_AMBULATORY_CARE_PROVIDER_SITE_OTHER): Payer: Medicare Other | Admitting: *Deleted

## 2013-08-05 DIAGNOSIS — Z5181 Encounter for therapeutic drug level monitoring: Secondary | ICD-10-CM

## 2013-08-05 DIAGNOSIS — I4891 Unspecified atrial fibrillation: Secondary | ICD-10-CM

## 2013-08-05 LAB — POCT INR: INR: 2.4

## 2013-08-23 ENCOUNTER — Encounter: Payer: Self-pay | Admitting: Endocrinology

## 2013-08-23 ENCOUNTER — Ambulatory Visit (INDEPENDENT_AMBULATORY_CARE_PROVIDER_SITE_OTHER): Payer: Medicare Other | Admitting: Endocrinology

## 2013-08-23 ENCOUNTER — Encounter: Payer: Medicare Other | Attending: Endocrinology | Admitting: Nutrition

## 2013-08-23 VITALS — BP 118/50 | HR 51 | Temp 97.9°F | Resp 14 | Ht 66.0 in | Wt 207.4 lb

## 2013-08-23 DIAGNOSIS — IMO0001 Reserved for inherently not codable concepts without codable children: Secondary | ICD-10-CM

## 2013-08-23 DIAGNOSIS — E785 Hyperlipidemia, unspecified: Secondary | ICD-10-CM

## 2013-08-23 DIAGNOSIS — K209 Esophagitis, unspecified without bleeding: Secondary | ICD-10-CM | POA: Insufficient documentation

## 2013-08-23 DIAGNOSIS — Z713 Dietary counseling and surveillance: Secondary | ICD-10-CM | POA: Insufficient documentation

## 2013-08-23 DIAGNOSIS — E1129 Type 2 diabetes mellitus with other diabetic kidney complication: Secondary | ICD-10-CM | POA: Insufficient documentation

## 2013-08-23 DIAGNOSIS — E119 Type 2 diabetes mellitus without complications: Secondary | ICD-10-CM

## 2013-08-23 DIAGNOSIS — I129 Hypertensive chronic kidney disease with stage 1 through stage 4 chronic kidney disease, or unspecified chronic kidney disease: Secondary | ICD-10-CM | POA: Diagnosis not present

## 2013-08-23 DIAGNOSIS — N184 Chronic kidney disease, stage 4 (severe): Secondary | ICD-10-CM

## 2013-08-23 DIAGNOSIS — E1165 Type 2 diabetes mellitus with hyperglycemia: Principal | ICD-10-CM

## 2013-08-23 DIAGNOSIS — I5032 Chronic diastolic (congestive) heart failure: Secondary | ICD-10-CM

## 2013-08-23 DIAGNOSIS — D500 Iron deficiency anemia secondary to blood loss (chronic): Secondary | ICD-10-CM

## 2013-08-23 HISTORY — DX: Esophagitis, unspecified without bleeding: K20.90

## 2013-08-23 HISTORY — DX: Chronic diastolic (congestive) heart failure: I50.32

## 2013-08-23 HISTORY — DX: Iron deficiency anemia secondary to blood loss (chronic): D50.0

## 2013-08-23 LAB — GLUCOSE, POCT (MANUAL RESULT ENTRY): POC GLUCOSE: 191 mg/dL — AB (ref 70–99)

## 2013-08-23 MED ORDER — VICTOZA 18 MG/3ML ~~LOC~~ SOPN: 1.2000 mg | PEN_INJECTOR | Freq: Every day | SUBCUTANEOUS | Status: DC

## 2013-08-23 MED ORDER — CEPHALEXIN 500 MG PO CAPS: 500.0000 mg | ORAL_CAPSULE | Freq: Two times a day (BID) | ORAL | Status: DC

## 2013-08-23 MED ORDER — INSULIN GLARGINE 100 UNIT/ML SOLOSTAR PEN: 15.0000 [IU] | PEN_INJECTOR | Freq: Every day | SUBCUTANEOUS | Status: DC

## 2013-08-23 NOTE — Progress Notes (Signed)
This patient and her daughter were instructed on the use of the Lantus and Victoza pens. We discussed the Lantus insulin, and how to dial the dose, and inject the insulin.  She reported good understanding of this.  She re demonstrated how to put the needle on and how to dial 8u on the pen. We reviewed the appropriate sites for injections, and she has agreed to use her abdomenal area.  She was shown the different areas on her abdomen  to use, and the need to rotate these sites qd.   We also discussed the need to increase the dose by 2u every 3 days until the FBS is below 150.  She reported good understanding of this  We also discussed low blood sugars--symptoms and treatments.  She reported good understanding of this and had no questions.  She was also show how to use the Victoza pen, and we discussed how this medication will work to lower blood sugar levels.  She dialed up 0.6 dosage on the Victoza pen and re verbalized the need to do this every night.  She was given starter kits of Lantus and Victoza, as well as samples of Nano pen needles.    She was encouraged to test her blood sugars fasting and one other time, 2hr. pc one meal--varying the meal each day.  She agreed to do this.  She had no final questions.

## 2013-08-23 NOTE — Patient Instructions (Signed)
Take 8u of Lantus every night.   Increase the dose of Lantus by 2u every 3 days, until the morning  Blood sugar is less than 150. Take Victoza, 0.6 every night. Test blood sugars every morning, and then 2hr. After one meal each day.

## 2013-08-23 NOTE — Progress Notes (Signed)
Patient ID: [name], female   DOB: 12-17-1933, 80 y.o.   MRN: [id number]    Reason for Appointment: Consultation for Type 2 Diabetes Referring physician: Jordan Hawks Rankins  History of Present Illness:          Diagnosis: Type 2 diabetes mellitus, date of diagnosis: ? 24 years ago        Past history: She was initially treated with Metformin but this was done because it caused GI side effects  May also tried Actos at some point, probably started because of the fear of side effects but did not know if it helped Actos stopped ? Reason Over the last few years she has been taking Precose with meals and also Januvia in increasing doses  Recent history:  Outside records including the note from 08/09/13 from PCP  reviewed The patient thinks her blood sugars have been higher since 9/14 and before that they were as low as 70. At that time she had abdominal surgery Over the last few months her blood sugars have been upto 235 although she is checking her blood sugars mostly fasting Her only A1c available recently is from 5/15 She was seen by her PCP in 5/15 and her glucose at that time was 307 Januvia dose was increased to 100mg  since 5/15, she thinks her blood sugars are better recently but still nearly 200 in the mornings She is compliant with all her medications, takes  Acarbose mid meal rather than before eating.      Oral hypoglycemic drugs the patient is taking are: Januvia, Precose, glipizide ER      Side effects from medications have been: None     Glucose monitoring:  done one time a day         Glucometer:  unknown    Blood Glucose readings from recall:  PREMEAL Breakfast Lunch Dinner Bedtime  Overall   Glucose range: 170-195  ?    Median:     ?    Hypoglycemia: None      Glycemic control:   A1c on 08/02/13 was 9.4, previous levels from PCP not available  Lab Results  Component Value Date   HGBA1C 8.1* 12/09/2012   Lab Results  Component Value Date   CREATININE 1.54*  12/21/2012   Recent creatinine was 3.5 in 5/15 Urine microalbumin report not available  Self-care: The diet that the patient has been following is: None.      Meals: 3 meals per day.          Exercise:  none, unable to         Dietician visit: Most recent: Unknown.               Retinal exam: Most recent: Within the last year  Weight history: Wt Readings from Last 3 Encounters:  08/23/13 207 lb 6.4 oz (94.076 kg)  02/22/13 219 lb (99.338 kg)  12/21/12 251 lb 5.2 oz (114 kg)      Medication List       This list is accurate as of: 08/23/13  9:21 AM.  Always use your most recent med list.               acarbose 100 MG tablet  Commonly known as:  PRECOSE  Take 100 mg by mouth 3 (three) times daily with meals.     amiodarone 200 MG tablet  Commonly known as:  PACERONE  Take 1 tablet (200 mg total) by mouth daily.     amLODipine  10 MG tablet  Commonly known as:  NORVASC     atorvastatin 40 MG tablet  Commonly known as:  LIPITOR  Take 40 mg by mouth daily.     bisacodyl 10 MG suppository  Commonly known as:  DULCOLAX  Place 1 suppository (10 mg total) rectally daily as needed.     CALCIUM 1200 PO  Take 1,200 mg by mouth daily.     Cranberry 250 MG Caps  Take 250 mg by mouth daily.     diltiazem 360 MG 24 hr capsule  Commonly known as:  CARDIZEM CD  Take 1 capsule (360 mg total) by mouth daily.     doxazosin 4 MG tablet  Commonly known as:  CARDURA  Take 1/2 tab daily     econazole nitrate 1 % cream  Apply 1 application topically daily as needed (to groin rash as needed).     esomeprazole 20 MG capsule  Commonly known as:  NEXIUM  Take 40 mg by mouth daily before breakfast.     ferrous sulfate 325 (65 FE) MG tablet  Take 325 mg by mouth daily with breakfast.     fluticasone 50 MCG/ACT nasal spray  Commonly known as:  FLONASE  Place 2 sprays into the nose daily as needed for allergies.     furosemide 80 MG tablet  Commonly known as:  LASIX  Take 80 mg  by mouth 3 (three) times daily.     glipiZIDE 10 MG 24 hr tablet  Commonly known as:  GLUCOTROL XL  Take 10 mg by mouth daily.     metolazone 5 MG tablet  Commonly known as:  ZAROXOLYN  Take 5 mg by mouth every Monday, Wednesday, and Friday.     metoprolol 50 MG tablet  Commonly known as:  LOPRESSOR  Take 50 mg by mouth 2 (two) times daily.     multivitamin with minerals Tabs tablet  Take 1 tablet by mouth daily.     oxyCODONE-acetaminophen 5-325 MG per tablet  Commonly known as:  PERCOCET/ROXICET  Take 1-2 tablets by mouth every 4 (four) hours as needed.     pantoprazole 20 MG tablet  Commonly known as:  PROTONIX  Take 20 mg by mouth daily.     potassium chloride 10 MEQ tablet  Commonly known as:  K-DUR,KLOR-CON  Take 10 mEq by mouth 2 (two) times daily.     sitaGLIPtin 25 MG tablet  Commonly known as:  JANUVIA  Take 25 mg by mouth daily.     warfarin 5 MG tablet  Commonly known as:  COUMADIN  Take 1 tablet (5 mg total) by mouth as directed.        Allergies:  Allergies  Allergen Reactions  . Lotensin [Benazepril Hcl] Anaphylaxis  . Zantac [Ranitidine Hcl] Anaphylaxis    Past Medical History  Diagnosis Date  . Hypertension   . Diabetes mellitus without complication   . Irregular heart beat   . Renal disorder   . Ileus, postoperative 12/17/2012    Past Surgical History  Procedure Laterality Date  . Hernia repair    . Right lumpectomy    . Cataract extraction, bilateral    . Ventral hernia repair N/A 12/12/2012    Procedure: LAPAROSCOPIC VENTRAL HERNIA;  Surgeon: Madilyn Hook, DO;  Location: [facility];  Service: General;  Laterality: N/A;  . Insertion of mesh N/A 12/12/2012    Procedure: INSERTION OF MESH;  Surgeon: Madilyn Hook, DO;  Location: [facility];  Service: General;  Laterality: N/A;  . Laparoscopic lysis of adhesions N/A 12/12/2012    Procedure: LAPAROSCOPIC LYSIS OF ADHESIONS;  Surgeon: Madilyn Hook, DO;  Location: [facility];  Service: General;   Laterality: N/A;    No family history on file.  Social History:  reports that she has quit smoking. Her smoking use included Cigarettes. She smoked 0.00 packs per day. She has never used smokeless tobacco. She reports that she does not drink alcohol or use illicit drugs.    Review of Systems       Lipids: She has been on high dose Lipitor for this, recent LDL was 34 from PCP       No results found for this basename: CHOL, HDL, LDLCALC, LDLDIRECT, TRIG, CHOLHDL       No unusual headaches.                  Skin: No rash or infections     Thyroid:  No  unusual fatigue.     The blood pressure has been controlled with multiple medications      She has had marked swelling of feet and has had multiple diuretics, managed by a nephrologist. Now has mostly residual swelling on her left leg    Chronic kidney disease: This appears to be worse recently, followed by a nephrologist. Not clear of the etiology of this condition     She will get some shortness of breath on exertion. There is a reported history of CHF but her ejection fraction was 65% last year    No recent chest pain. She has history of long-standing atrial fibrillation treated with amiodarone and is on Coumadin     Bowel habits: Normal.       No frequency of urination or nocturia       No joint  Pains. Has back pain      No  depression          She has a history of tingling but no numbness in feet       She has weakness in her legs since last year, needs a walker and cannot walk very long  All other review of systems are negative   LABS:  Office Visit on 08/23/2013  Component Date Value Ref Range Status  . POC Glucose 08/23/2013 191* 70 - 99 mg/dl Final    Physical Examination:  BP 118/50  Pulse 51  Temp(Src) 97.9 F (36.6 C)  Resp 14  Ht 5\' 6"  (1.676 m)  Wt 207 lb 6.4 oz (94.076 kg)  BMI 33.49 kg/m2  SpO2 94%  GENERAL:         Patient has generalized obesity.   HEENT:         Eye exam shows normal  external appearance. Fundus exam shows no retinopathy. Oral exam shows normal mucosa .  NECK:         General:  Neck exam shows no lymphadenopathy. Carotids are normal to palpation and no bruit heard.  Thyroid is not enlarged and no nodules felt.   LUNGS:         Chest is symmetrical. Lungs are clear to auscultation.Marland Kitchen   HEART:         Heart sounds:  S1 and S2 are normal. No murmurs or clicks heard., no S3 or S4.   ABDOMEN:   There is no distention present. Liver and spleen are not palpable. No other mass or tenderness present.  EXTREMITIES:  she has 2-3+ edema of the left lower  leg with stasis pigmentation. She has significant redness and warmth of the right great toe all the way up to the metatarsophalangeal joint. No tenderness of the joint. No skin ulceration. Her big toenail on that side is cut very close to the skin Has onychomycosis of the left great toe She had mild clubbing of the fingernails  NEUROLOGICAL:    she is able to walk with assistance in the exam room  Vibration sense is mildly reduced in toes. Ankle jerks are 1+ bilaterally.          Diabetic foot exam:  as in the foot exam section, has normal monofilament sensation. Absent pedal pulses MUSCULOSKELETAL:       There is no enlargement or deformity of the joints. Spine is normal to inspection.Marland Kitchen   SKIN:       No rash or lesions of concern.        ASSESSMENT:  Diabetes type 2, uncontrolled    She has had a very long history of diabetes which appears to be progressing and not controlled with a triple drug regimen She has difficulty losing weight and because of her disability is unable to exercise Also has had minimal diabetes education and appears not to be following a balanced meal plan Other problems include  Not clear if she is using a reliable meter to check her sugar  Checking blood sugars only in the morning and not after meals  Taking higher than recommended doses of Januvia and Precose for her current level of renal  function   Poor understanding of diabetes overall  Discussed with the patient that because of her renal dysfunction she has limited choices of therapeutic options especially since she has failed several drugs and not clear if Actos would be safe or effective in her case She is good candidate for a combination of basal insulin and Victoza which would help both fasting and postprandial hyperglycemia Meanwhile will need to stop her Januvia and Precose  Discussed with the patient the nature of GLP-1 drugs, the action on various organ systems, how they benefit blood glucose control, as well as the benefit of weight loss and  increase satiety . Explained possible side effects especially nausea and vomiting; discussed safety information in package insert. Described injection technique  of Victoza  starting with 0.6 mg once a day at the same time daily  Patient brochure on Victoza and co-pay card given For now will not increase her dosage but will wait until her followup  Also discussed in detail of basal insulin works, timing of injection, dosage titration, possible hypoglycemia  She will be instructed in detail on the injection technique by nurse educator today  Cellulitis of right great toe is new and currently untreated  Hyperlipidemia history: Adequately controlled  Complications:  PLAN:  Start VICTOZA injection with the sample pen once daily at the same time of the day preferably at bedtime.  Dial the dose to 0.6 mg and continue until the next visit  You may  experience nausea in the first few days which usually gets better  You may inject in the stomach, thigh or arm.   You will feel fullness of the stomach with starting the medication and should try to keep portions of food small.    LANTUS insulin: Start taking 8 units at bedtime. If after 3 days your morning sugars still over 150 increase the dose by 2 units and continue to increase every 3 days until the blood sugar is below 150  She  will check blood sugars at least once a day about 2 hours after any meal and daily on waking up.  She will bring blood sugar record to each visit and will consider switching her to a brand-new meter if needed on her next visit  Stop Januvia and Precose, continue glipizide  For the cellulitis of her toe will prescribe her Keflex, reduced dose because of renal dysfunction  Counseling time over 50% of today's 60 minute visit   Elayne Snare 08/23/2013, 9:21 AM   Note: This office note was prepared with Dragon voice recognition system technology. Any transcriptional errors that result from this process are unintentional.

## 2013-08-23 NOTE — Patient Instructions (Signed)
Start VICTOZA injection with the sample pen once daily at the same time of the day preferably at bedtime.  Dial the dose to 0.6 mg and continue until the next visit  You may  experience nausea in the first few days which usually gets better  You may inject in the stomach, thigh or arm.   You will feel fullness of the stomach with starting the medication and should try to keep portions of food small.    LANTUS insulin: Start taking 8 units at bedtime. If after 3 days your morning sugars still over 150 increase the dose by 2 units and continue to increase every 3 days until the blood sugar is below 150  Please check blood sugars at least once a day about 2 hours after any meal and daily on waking up. Please bring blood sugar record to each visit  Stop Januvia and Precose, continue glipizide

## 2013-08-25 ENCOUNTER — Ambulatory Visit (INDEPENDENT_AMBULATORY_CARE_PROVIDER_SITE_OTHER): Payer: Medicare Other | Admitting: Interventional Cardiology

## 2013-08-25 ENCOUNTER — Ambulatory Visit
Admission: RE | Admit: 2013-08-25 | Discharge: 2013-08-25 | Disposition: A | Discharge status: Home / Self Care | Payer: Medicare Other | Source: Ambulatory Visit | Attending: Interventional Cardiology | Admitting: Interventional Cardiology

## 2013-08-25 ENCOUNTER — Encounter: Payer: Self-pay | Admitting: Interventional Cardiology

## 2013-08-25 ENCOUNTER — Ambulatory Visit (INDEPENDENT_AMBULATORY_CARE_PROVIDER_SITE_OTHER): Payer: Medicare Other

## 2013-08-25 VITALS — BP 119/58 | HR 66 | Ht 66.0 in | Wt 205.0 lb

## 2013-08-25 DIAGNOSIS — Z7901 Long term (current) use of anticoagulants: Secondary | ICD-10-CM

## 2013-08-25 DIAGNOSIS — Z5181 Encounter for therapeutic drug level monitoring: Secondary | ICD-10-CM

## 2013-08-25 DIAGNOSIS — I4891 Unspecified atrial fibrillation: Secondary | ICD-10-CM

## 2013-08-25 DIAGNOSIS — Z79899 Other long term (current) drug therapy: Secondary | ICD-10-CM

## 2013-08-25 DIAGNOSIS — I10 Essential (primary) hypertension: Secondary | ICD-10-CM

## 2013-08-25 LAB — CBC WITH DIFFERENTIAL/PLATELET
BASOS ABS: 0 10*3/uL (ref 0.0–0.1)
Basophils Relative: 0 % (ref 0.0–3.0)
EOS ABS: 0.1 10*3/uL (ref 0.0–0.7)
Eosinophils Relative: 1.4 % (ref 0.0–5.0)
HCT: 35.5 % — ABNORMAL LOW (ref 36.0–46.0)
Hemoglobin: 11.2 g/dL — ABNORMAL LOW (ref 12.0–15.0)
Lymphocytes Relative: 14.7 % (ref 12.0–46.0)
Lymphs Abs: 1.2 10*3/uL (ref 0.7–4.0)
MCHC: 31.6 g/dL (ref 30.0–36.0)
Monocytes Absolute: 0.8 10*3/uL (ref 0.1–1.0)
Monocytes Relative: 9.5 % (ref 3.0–12.0)
Neutro Abs: 6.1 10*3/uL (ref 1.4–7.7)
Neutrophils Relative %: 74.4 % (ref 43.0–77.0)
Platelets: 297 10*3/uL (ref 150.0–400.0)
RBC: 5.15 Mil/uL — ABNORMAL HIGH (ref 3.87–5.11)
RDW: 22.8 % — AB (ref 11.5–15.5)
WBC: 8.2 10*3/uL (ref 4.0–10.5)

## 2013-08-25 LAB — POCT INR: INR: 2.9

## 2013-08-25 LAB — TSH: TSH: 2.12 u[IU]/mL (ref 0.35–4.50)

## 2013-08-25 LAB — HEMOGLOBIN: Hemoglobin: 11.2 g/dL — ABNORMAL LOW (ref 12.0–15.0)

## 2013-08-25 NOTE — Progress Notes (Signed)
Patient ID: [name], female   DOB: 18-Jun-1933, 80 y.o.   MRN: [id number]    [address] [place], [place]  [address] Phone: [phone] Fax:  [phone]  Date:  08/25/2013   ID:  [name], DOB May 27, 1933, MRN [id number]  PCP:  Milagros Evener, MD   ASSESSMENT:  1. Chronic atrial fibrillation, controlled with amiodarone therapy 2. Hypertension 3. Chronic anticoagulation therapy 4. Chronic amiodarone therapy. Recent comprehensive metabolic panel did not demonstrate any evidence of hepatic dysfunction 5. Suspect anemia  PLAN:  1. CBC, TSH, and PA lateral chest x-ray 2. Clinical followup in 6 months 3. Adjustment in medical therapy based upon symptoms and laboratory data   SUBJECTIVE: [name] is a 80 y.o. female who has had fatigue. Recent metabolic derangements including markedly elevated blood sugars. She also has severe hypokalemia and deterioration in renal function. Diuretic regimen has been downwardly adjusted by Dr. Zadie Rhine. The patient denies orthopnea, PND, and chest pain. She does note left greater than right lower extremity edema. She develops acute diastolic heart failure when in atrial fibrillation. No recent episodes of A. fib are noted. She has seen both Dr. Justin Mend and Dr. Zadie Rhine recently.   Wt Readings from Last 3 Encounters:  08/25/13 205 lb (92.987 kg)  08/23/13 207 lb 6.4 oz (94.076 kg)  02/22/13 219 lb (99.338 kg)     Past Medical History  Diagnosis Date  . Hypertension   . Diabetes mellitus without complication   . Irregular heart beat   . Renal disorder   . Ileus, postoperative 12/17/2012    Current Outpatient Prescriptions  Medication Sig Dispense Refill  . amiodarone (PACERONE) 200 MG tablet Take 1 tablet (200 mg total) by mouth daily.  30 tablet  3  . amLODipine (NORVASC) 10 MG tablet       . atorvastatin (LIPITOR) 40 MG tablet Take 40 mg by mouth daily.      . bisacodyl (DULCOLAX) 10 MG suppository Place 1  suppository (10 mg total) rectally daily as needed.  12 suppository  0  . Calcium Carbonate-Vit D-Min (CALCIUM 1200 PO) Take 1,200 mg by mouth daily.      . cephALEXin (KEFLEX) 500 MG capsule Take 1 capsule (500 mg total) by mouth 2 (two) times daily.  14 capsule  0  . Cranberry 250 MG CAPS Take 250 mg by mouth daily.      Marland Kitchen diltiazem (CARDIZEM CD) 360 MG 24 hr capsule Take 1 capsule (360 mg total) by mouth daily.      Marland Kitchen doxazosin (CARDURA) 4 MG tablet Take 4 mg by mouth daily. Take 1/2 tab daily      . econazole nitrate 1 % cream Apply 1 application topically daily as needed (to groin rash as needed).      Marland Kitchen esomeprazole (NEXIUM) 20 MG capsule Take 40 mg by mouth daily before breakfast.      . ferrous sulfate 325 (65 FE) MG tablet Take 325 mg by mouth daily with breakfast.      . fluticasone (FLONASE) 50 MCG/ACT nasal spray Place 2 sprays into the nose daily as needed for allergies.      . furosemide (LASIX) 80 MG tablet Take 80 mg by mouth 3 (three) times daily.      Marland Kitchen glipiZIDE (GLUCOTROL XL) 10 MG 24 hr tablet Take 10 mg by mouth daily.      . Insulin Glargine (LANTUS SOLOSTAR) 100 UNIT/ML Solostar Pen Inject 15 Units into  the skin daily at 10 pm.  5 pen  PRN  . metolazone (ZAROXOLYN) 5 MG tablet Take 5 mg by mouth every Monday, Wednesday, and Friday.      . metoprolol (LOPRESSOR) 50 MG tablet Take 50 mg by mouth 2 (two) times daily.      . Multiple Vitamin (MULTIVITAMIN WITH MINERALS) TABS tablet Take 1 tablet by mouth daily.      Marland Kitchen oxyCODONE-acetaminophen (PERCOCET/ROXICET) 5-325 MG per tablet Take 1-2 tablets by mouth every 4 (four) hours as needed.  30 tablet  0  . pantoprazole (PROTONIX) 20 MG tablet Take 20 mg by mouth daily.      . potassium chloride (K-DUR,KLOR-CON) 10 MEQ tablet Take 10 mEq by mouth daily. TAKE 3 TABS A.M.   AND 2 TABS IN THE P.M.      . VICTOZA 18 MG/3ML SOPN Inject 1.2 mg into the skin daily. Inject once daily at the same time  2 pen  3  . warfarin (COUMADIN) 5 MG  tablet Take 1 tablet (5 mg total) by mouth as directed.  30 tablet  3   No current facility-administered medications for this visit.    Allergies:    Allergies  Allergen Reactions  . Lotensin [Benazepril Hcl] Anaphylaxis  . Zantac [Ranitidine Hcl] Anaphylaxis    Social History:  The patient  reports that she has quit smoking. Her smoking use included Cigarettes. She smoked 0.00 packs per day. She has never used smokeless tobacco. She reports that she does not drink alcohol or use illicit drugs.   ROS:  Please see the history of present illness.   No blood in the urine or stool. She has not taken iron supplements.   All other systems reviewed and negative.   OBJECTIVE: VS:  BP 119/58  Pulse 66  Ht 5\' 6"  (1.676 m)  Wt 205 lb (92.987 kg)  BMI 33.10 kg/m2 Well nourished, well developed, in no acute distress, elderly and chronically ill-appearing. Obese. HEENT: Marked conjunctival pallor Neck: JVD flat. Carotid bruit absent  Cardiac:  normal S1, S2; RRR; no murmur Lungs:  clear to auscultation bilaterally, no wheezing, rhonchi or rales Abd: soft, nontender, no hepatomegaly Ext: Edema left 2+ edema. Pulses 2+ and symmetric Skin: warm and dry Neuro:  CNs 2-12 intact, no focal abnormalities noted  EKG:  Normal sinus rhythm   with nonspecific ST-T wave abnormality and prolonged QTc. First degree AV block is also noted.   Signed, Illene Labrador III, MD 08/25/2013 9:36 AM

## 2013-08-25 NOTE — Patient Instructions (Signed)
Your physician recommends that you continue on your current medications as directed. Please refer to the Current Medication list given to you today.  Lab today: Tsh,Hemoglobin,Cbc  A chest x-ray takes a picture of the organs and structures inside the chest, including the heart, lungs, and blood vessels. This test can show several things, including, whether the heart is enlarges; whether fluid is building up in the lungs; and whether pacemaker / defibrillator leads are still in place.( To be done at [facility] [address]st floor)  Your physician recommends that you return for lab work and chest xray in: 29months   Your physician wants you to follow-up in: 6 monts You will receive a reminder letter in the mail two months in advance. If you don't receive a letter, please call our office to schedule the follow-up appointment.

## 2013-08-29 ENCOUNTER — Telehealth: Payer: Self-pay

## 2013-08-29 NOTE — Telephone Encounter (Signed)
pt given cxr and lab reults.Chest x-ray is stable. No acute abnormality.labs   Mild anemia. will Forward to PCP. pt verbalized understanding.

## 2013-08-29 NOTE — Telephone Encounter (Signed)
Message copied by Lamar Laundry on Mon Aug 29, 2013 11:14 AM ------      Message from: Daneen Schick      Created: Fri Aug 26, 2013 10:10 AM       Chest x-ray is stable. No acute abnormality ------

## 2013-08-29 NOTE — Telephone Encounter (Signed)
Message copied by Lamar Laundry on Mon Aug 29, 2013 11:15 AM ------      Message from: Daneen Schick      Created: Fri Aug 26, 2013 10:02 AM       Mild anemia. Forward to PCP. ------

## 2013-09-06 ENCOUNTER — Ambulatory Visit (INDEPENDENT_AMBULATORY_CARE_PROVIDER_SITE_OTHER): Payer: Medicare Other | Admitting: Endocrinology

## 2013-09-06 ENCOUNTER — Encounter: Payer: Self-pay | Admitting: Endocrinology

## 2013-09-06 VITALS — BP 98/46 | HR 63 | Temp 98.1°F | Resp 14 | Ht 66.0 in | Wt 204.6 lb

## 2013-09-06 DIAGNOSIS — E1165 Type 2 diabetes mellitus with hyperglycemia: Secondary | ICD-10-CM

## 2013-09-06 DIAGNOSIS — IMO0002 Reserved for concepts with insufficient information to code with codable children: Secondary | ICD-10-CM

## 2013-09-06 DIAGNOSIS — E785 Hyperlipidemia, unspecified: Secondary | ICD-10-CM

## 2013-09-06 DIAGNOSIS — IMO0001 Reserved for inherently not codable concepts without codable children: Secondary | ICD-10-CM

## 2013-09-06 DIAGNOSIS — I959 Hypotension, unspecified: Secondary | ICD-10-CM

## 2013-09-06 HISTORY — DX: Type 2 diabetes mellitus with hyperglycemia: E11.65

## 2013-09-06 HISTORY — DX: Reserved for concepts with insufficient information to code with codable children: IMO0002

## 2013-09-06 NOTE — Patient Instructions (Signed)
Stop Lantus   Victoza 1.2mg  daily   Please check blood sugars at least half the time about 2 hours after any meal and times per week on waking up. Please bring blood sugar monitor to each visit

## 2013-09-06 NOTE — Progress Notes (Signed)
Patient ID: [name], female   DOB: 1934/02/10, 80 y.o.   MRN: [id number]    Reason for Appointment: F/u for Type 2 Diabetes  Referring physician: Jordan Hawks Rankins  History of Present Illness:          Diagnosis: Type 2 diabetes mellitus, date of diagnosis: ? 9 years ago        Past history: She was initially treated with Metformin but this was done because it caused GI side effects  May also tried Actos at some point, probably started because of the fear of side effects but did not know if it helped Actos stopped ? Reason Over the last few years she has been taking Precose with meals and also Januvia in increasing doses Apparently her blood sugars have been higher since 9/14 and before that they were as low as 70. At that time she had abdominal surgery  Recent history:  Over the last few months her blood sugars have been upto 235 although was checking her blood sugars mostly fasting Most recent A1c in 5/15 was 9.4 On her initial consultation because of her renal dysfunction her medication regimen was changed. Previously was taking Januvia, Precose and glipizide without adequate control She was started on Victoza 0.6 kg along with Lantus insulin 8 units a day She has been able to do the injections herself fairly well Blood sugar records were reviewed from the patient's home diary Currently her blood sugars are running fairly good in the morning with readings as low as 80 or 78 Occasionally he has had readings over 300 especially last weekend and most of her postprandial readings are about 170-200 range She has had no side effects from Victoza although she did have transient diarrhea last week for 4 days, now having some constipation      Oral hypoglycemic drugs the patient is taking are: None    Side effects from medications have been: None     Glucose monitoring:  done once or twice a day        Glucometer:  true result  Blood sugars and her home diary are difficult to review  but appeared to be normal fasting and mostly high after meals with variability. Checking blood sugars some after lunch or supper  Hypoglycemia: None      Glycemic control:   A1c on 08/02/13 was 9.4, previous levels from PCP not available  Lab Results  Component Value Date   HGBA1C 8.1* 12/09/2012   Lab Results  Component Value Date   CREATININE 1.54* 12/21/2012   Recent creatinine was 3.5 in 5/15 Urine microalbumin report not available  Self-care: The diet that the patient has been following is: None.      Meals: 3 meals per day. sometimes will have higher fat meals like chicken wings           Exercise:  none, unable to         Dietician visit: Most recent: Unknown.               Retinal exam: Most recent: Within the last year  Weight history: Wt Readings from Last 3 Encounters:  09/06/13 204 lb 9.6 oz (92.806 kg)  08/25/13 205 lb (92.987 kg)  08/23/13 207 lb 6.4 oz (94.076 kg)      Medication List       This list is accurate as of: 09/06/13  9:22 AM.  Always use your most recent med list.  amiodarone 200 MG tablet  Commonly known as:  PACERONE  Take 1 tablet (200 mg total) by mouth daily.     amLODipine 10 MG tablet  Commonly known as:  NORVASC     atorvastatin 40 MG tablet  Commonly known as:  LIPITOR  Take 40 mg by mouth daily.     bisacodyl 10 MG suppository  Commonly known as:  DULCOLAX  Place 1 suppository (10 mg total) rectally daily as needed.     CALCIUM 1200 PO  Take 1,200 mg by mouth daily.     cephALEXin 500 MG capsule  Commonly known as:  KEFLEX  Take 1 capsule (500 mg total) by mouth 2 (two) times daily.     Cranberry 250 MG Caps  Take 250 mg by mouth daily.     diltiazem 360 MG 24 hr capsule  Commonly known as:  CARDIZEM CD  Take 1 capsule (360 mg total) by mouth daily.     doxazosin 4 MG tablet  Commonly known as:  CARDURA  Take 4 mg by mouth daily. Take 1/2 tab daily     econazole nitrate 1 % cream  Apply 1  application topically daily as needed (to groin rash as needed).     esomeprazole 20 MG capsule  Commonly known as:  NEXIUM  Take 40 mg by mouth daily before breakfast.     ferrous sulfate 325 (65 FE) MG tablet  Take 325 mg by mouth daily with breakfast.     fluticasone 50 MCG/ACT nasal spray  Commonly known as:  FLONASE  Place 2 sprays into the nose daily as needed for allergies.     furosemide 80 MG tablet  Commonly known as:  LASIX  Take 80 mg by mouth 3 (three) times daily.     glipiZIDE 10 MG 24 hr tablet  Commonly known as:  GLUCOTROL XL  Take 10 mg by mouth daily.     Insulin Glargine 100 UNIT/ML Solostar Pen  Commonly known as:  LANTUS SOLOSTAR  Inject 15 Units into the skin daily at 10 pm.     metolazone 5 MG tablet  Commonly known as:  ZAROXOLYN  Take 5 mg by mouth every Monday, Wednesday, and Friday.     metoprolol 50 MG tablet  Commonly known as:  LOPRESSOR  Take 50 mg by mouth 2 (two) times daily.     multivitamin with minerals Tabs tablet  Take 1 tablet by mouth daily.     oxyCODONE-acetaminophen 5-325 MG per tablet  Commonly known as:  PERCOCET/ROXICET  Take 1-2 tablets by mouth every 4 (four) hours as needed.     pantoprazole 20 MG tablet  Commonly known as:  PROTONIX  Take 20 mg by mouth daily.     potassium chloride 10 MEQ tablet  Commonly known as:  K-DUR,KLOR-CON  Take 10 mEq by mouth daily. TAKE 3 TABS A.M.   AND 2 TABS IN THE P.M.     VICTOZA 18 MG/3ML Sopn  Generic drug:  Liraglutide  Inject 1.2 mg into the skin daily. Inject once daily at the same time     warfarin 5 MG tablet  Commonly known as:  COUMADIN  Take 1 tablet (5 mg total) by mouth as directed.        Allergies:  Allergies  Allergen Reactions  . Lotensin [Benazepril Hcl] Anaphylaxis  . Zantac [Ranitidine Hcl] Anaphylaxis    Past Medical History  Diagnosis Date  . Hypertension   . Diabetes mellitus without  complication   . Irregular heart beat   . Renal disorder    . Ileus, postoperative 12/17/2012    Past Surgical History  Procedure Laterality Date  . Hernia repair    . Right lumpectomy    . Cataract extraction, bilateral    . Ventral hernia repair N/A 12/12/2012    Procedure: LAPAROSCOPIC VENTRAL HERNIA;  Surgeon: Madilyn Hook, DO;  Location: [facility];  Service: General;  Laterality: N/A;  . Insertion of mesh N/A 12/12/2012    Procedure: INSERTION OF MESH;  Surgeon: Madilyn Hook, DO;  Location: [facility];  Service: General;  Laterality: N/A;  . Laparoscopic lysis of adhesions N/A 12/12/2012    Procedure: LAPAROSCOPIC LYSIS OF ADHESIONS;  Surgeon: Madilyn Hook, DO;  Location: [facility];  Service: General;  Laterality: N/A;    Family History  Problem Relation Age of Onset  . Diabetes Mother   . Diabetes Sister     Social History:  reports that she has quit smoking. Her smoking use included Cigarettes. She smoked 0.00 packs per day. She has never used smokeless tobacco. She reports that she does not drink alcohol or use illicit drugs.    Review of Systems       Lipids: She has been on high dose Lipitor for this, recent LDL was 34 from PCP       No results found for this basename: CHOL,  HDL,  LDLCALC,  LDLDIRECT,  TRIG,  CHOLHDL       The blood pressure has been controlled with multiple medications including doxazosin and not clear why she is taking 3 drugs. Blood pressure today is lower than usual      She has had marked swelling of feet and has had multiple diuretics, managed by a nephrologist. Now has mostly residual swelling on her left leg    Chronic kidney disease: This appears to be worse recently, followed by a nephrologist. Not clear of the etiology of this condition     She has weakness in her legs since last year, needs a walker and cannot walk very long  No history of thyroid disease  LABS:  No visits with results within 1 Week(s) from this visit. Latest known visit with results is:  Office Visit on 08/25/2013  Component Date  Value Ref Range Status  . TSH 08/25/2013 2.12  0.35 - 4.50 uIU/mL Final  . Hemoglobin 08/25/2013 11.2* 12.0 - 15.0 g/dL Final  . WBC 08/25/2013 8.2  4.0 - 10.5 K/uL Final  . RBC 08/25/2013 5.15* 3.87 - 5.11 Mil/uL Final  . Hemoglobin 08/25/2013 11.2* 12.0 - 15.0 g/dL Final  . HCT 08/25/2013 35.5* 36.0 - 46.0 % Final  . MCV 08/25/2013 69.1 Repeated and verified X2.* 78.0 - 100.0 fl Final  . MCHC 08/25/2013 31.6  30.0 - 36.0 g/dL Final  . RDW 08/25/2013 22.8* 11.5 - 15.5 % Final  . Platelets 08/25/2013 297.0  150.0 - 400.0 K/uL Final  . Neutrophils Relative % 08/25/2013 74.4  43.0 - 77.0 % Final  . Lymphocytes Relative 08/25/2013 14.7  12.0 - 46.0 % Final  . Monocytes Relative 08/25/2013 9.5  3.0 - 12.0 % Final  . Eosinophils Relative 08/25/2013 1.4  0.0 - 5.0 % Final  . Basophils Relative 08/25/2013 0.0  0.0 - 3.0 % Final  . Neutro Abs 08/25/2013 6.1  1.4 - 7.7 K/uL Final  . Lymphs Abs 08/25/2013 1.2  0.7 - 4.0 K/uL Final  . Monocytes Absolute 08/25/2013 0.8  0.1 - 1.0 K/uL  Final  . Eosinophils Absolute 08/25/2013 0.1  0.0 - 0.7 K/uL Final  . Basophils Absolute 08/25/2013 0.0  0.0 - 0.1 K/uL Final    Physical Examination:  BP 98/46  Pulse 63  Temp(Src) 98.1 F (36.7 C)  Resp 14  Ht 5\' 6"  (1.676 m)  Wt 204 lb 9.6 oz (92.806 kg)  BMI 33.04 kg/m2  SpO2 95%   ASSESSMENT:  Diabetes type 2, uncontrolled    She has had a very long history of diabetes which had shown progression and A1c of over 9% on oral hypoglycemic drugs Although she has had an excellent response to taking Lantus insulin her fasting readings are low normal with taking only 8 units a day Most of her hyperglycemia appears to be after meals and somewhat dependent on her intake Currently taking only low-dose Victoza 0.6 mg and tolerating it well. Has not had any recent consultations with nutritionist, does need to improve her diet especially with cutting back on fried food Also she is not able to lose weight with  her inability to exercise She is currently using a generic monitor  for her testing and not clear how accurate this is   PLAN:   She will be continued on Victoza and she can try monotherapy with 1.2 mg daily for now This should help her portion control as well as postprandial hyperglycemia She will call if she has any side effects like nausea.  Most likely she can get control of fasting readings also with a higher dose and will leave off the Lantus for now She is somewhat concerned about the cost of her medications but currently does not know how much her Victoza is costing; will have her continue her regimen since she already has a one-month supply She will be given a brand-name glucose monitor to check her sugars and keep a better record of this She was shown how to use a One Touch Verio glucose monitor today May consider using Prandin before meals before starting mealtime insulin if she continues to have postprandial hyperglycemia She does need more diabetes education and will set her up for nutritional counseling also Discussed blood sugar targets, need for checking blood sugars 2 hours after various meals  Hypertension: Since her blood pressure is rather low for her age Will reduce her treatment regimen by eliminating doxazosin and she can follow with her primary care physician for this  Counseling time over 50% of today's 25 minute visit   KUMAR,AJAY 09/06/2013, 9:22 AM   Note: This office note was prepared with Estate agent. Any transcriptional errors that result from this process are unintentional.

## 2013-09-08 ENCOUNTER — Other Ambulatory Visit: Payer: Self-pay | Admitting: Interventional Cardiology

## 2013-09-13 ENCOUNTER — Other Ambulatory Visit: Payer: Self-pay | Admitting: Interventional Cardiology

## 2013-09-15 ENCOUNTER — Other Ambulatory Visit: Payer: Self-pay | Admitting: *Deleted

## 2013-09-15 ENCOUNTER — Telehealth: Payer: Self-pay | Admitting: Endocrinology

## 2013-09-15 MED ORDER — ONETOUCH DELICA LANCETS FINE MISC: Status: DC

## 2013-09-15 MED ORDER — GLUCOSE BLOOD VI STRP: ORAL_STRIP | Status: DC

## 2013-09-15 NOTE — Telephone Encounter (Signed)
rx sent

## 2013-09-15 NOTE — Telephone Encounter (Signed)
Patient states she got a one touch verio but no test strips  Pharmacy: [place] She needs a Rx for test strips   Thank You :)

## 2013-09-19 ENCOUNTER — Telehealth: Payer: Self-pay | Admitting: Endocrinology

## 2013-09-19 NOTE — Telephone Encounter (Signed)
Patient would like her one touch verio test strips refilled  Pharmacy: [facility]  Thank You :)

## 2013-09-20 ENCOUNTER — Other Ambulatory Visit: Payer: Self-pay | Admitting: *Deleted

## 2013-09-20 MED ORDER — GLUCOSE BLOOD VI STRP: ORAL_STRIP | Status: DC

## 2013-09-22 ENCOUNTER — Ambulatory Visit (INDEPENDENT_AMBULATORY_CARE_PROVIDER_SITE_OTHER): Payer: Medicare Other | Admitting: *Deleted

## 2013-09-22 ENCOUNTER — Other Ambulatory Visit (INDEPENDENT_AMBULATORY_CARE_PROVIDER_SITE_OTHER): Payer: Medicare Other

## 2013-09-22 ENCOUNTER — Telehealth: Payer: Self-pay | Admitting: *Deleted

## 2013-09-22 DIAGNOSIS — IMO0001 Reserved for inherently not codable concepts without codable children: Secondary | ICD-10-CM

## 2013-09-22 DIAGNOSIS — Z5181 Encounter for therapeutic drug level monitoring: Secondary | ICD-10-CM

## 2013-09-22 DIAGNOSIS — E1165 Type 2 diabetes mellitus with hyperglycemia: Principal | ICD-10-CM

## 2013-09-22 DIAGNOSIS — I4891 Unspecified atrial fibrillation: Secondary | ICD-10-CM

## 2013-09-22 LAB — POCT INR: INR: 3.1

## 2013-09-22 LAB — BASIC METABOLIC PANEL
BUN: 67 mg/dL — ABNORMAL HIGH (ref 6–23)
CO2: 31 meq/L (ref 19–32)
CREATININE: 2.9 mg/dL — AB (ref 0.4–1.2)
Calcium: 9.2 mg/dL (ref 8.4–10.5)
Chloride: 92 mEq/L — ABNORMAL LOW (ref 96–112)
GFR: 20.43 mL/min — ABNORMAL LOW (ref >60.00)
Glucose, Bld: 235 mg/dL — ABNORMAL HIGH (ref 70–99)
POTASSIUM: 2.7 meq/L — AB (ref 3.5–5.1)
Sodium: 134 mEq/L — ABNORMAL LOW (ref 135–145)

## 2013-09-22 NOTE — Telephone Encounter (Signed)
Please ask her who her nephrologist is and she needs to ask him about the potassium dosage. Please fax the report to the nephrologist

## 2013-09-22 NOTE — Telephone Encounter (Signed)
Patients Kidney Dr. Is Dr. Edrick Oh, labs faxed to him.

## 2013-09-22 NOTE — Telephone Encounter (Signed)
Hope from the lab called with a critical potassium of 2.7

## 2013-09-23 ENCOUNTER — Other Ambulatory Visit: Payer: Self-pay | Admitting: Interventional Cardiology

## 2013-09-26 LAB — FRUCTOSAMINE: Fructosamine: 339 umol/L — ABNORMAL HIGH (ref 190–270)

## 2013-09-29 ENCOUNTER — Encounter: Payer: Self-pay | Admitting: Endocrinology

## 2013-09-29 ENCOUNTER — Other Ambulatory Visit: Payer: Self-pay | Admitting: *Deleted

## 2013-09-29 ENCOUNTER — Ambulatory Visit (INDEPENDENT_AMBULATORY_CARE_PROVIDER_SITE_OTHER): Payer: Medicare Other | Admitting: Endocrinology

## 2013-09-29 VITALS — BP 99/57 | HR 63 | Temp 98.6°F | Resp 16 | Ht 66.0 in

## 2013-09-29 DIAGNOSIS — IMO0001 Reserved for inherently not codable concepts without codable children: Secondary | ICD-10-CM

## 2013-09-29 DIAGNOSIS — E876 Hypokalemia: Secondary | ICD-10-CM

## 2013-09-29 DIAGNOSIS — R031 Nonspecific low blood-pressure reading: Secondary | ICD-10-CM

## 2013-09-29 DIAGNOSIS — E1165 Type 2 diabetes mellitus with hyperglycemia: Principal | ICD-10-CM

## 2013-09-29 MED ORDER — REPAGLINIDE 1 MG PO TABS: 1.0000 mg | ORAL_TABLET | Freq: Three times a day (TID) | ORAL | Status: DC

## 2013-09-29 MED ORDER — GLUCOSE BLOOD VI STRP: ORAL_STRIP | Status: DC

## 2013-09-29 NOTE — Progress Notes (Signed)
Patient ID: [name], female   DOB: Oct 26, 1933, 80 y.o.   MRN: [id number]    Reason for Appointment: F/u for Type 2 Diabetes  Referring physician: Jordan Hawks Rankins  History of Present Illness:          Diagnosis: Type 2 diabetes mellitus, date of diagnosis: ? 49 years ago        Past history: She was initially treated with Metformin but this was done because it caused GI side effects  May also tried Actos at some point, probably started because of the fear of side effects but did not know if it helped Actos stopped ? Reason Over the last few years she has been taking Precose with meals and also Januvia in increasing doses Apparently her blood sugars have been higher since 9/14 and before that they were as low as 70. At that time she had abdominal surgery  Recent history:  On her initial consultation in 6/15 because of her renal dysfunction her medication regimen was changed. Previously was taking Januvia, Precose and glipizide without adequate control; her blood sugars were occasionally over 200 fasting and A1c was 9.4 She was started on Victoza 0.6 kg along with Lantus insulin 8 units a day She has been able to do the injections herself fairly well On followup since her sugars are still high after meals but relatively low fasting her insulin was stopped and Victoza was increased to 1.2 mg. She has had no side effects from this Blood sugar records were reviewed from the patient's home diary Currently her blood sugars are running fairly good in the morning but fairly consistently high after meals in the evening, not checking after lunch and lab glucose after breakfast was over 200 She has had no side effects from Victoza       Oral hypoglycemic drugs the patient is taking are: None    Side effects from medications have been: None     Glucose monitoring:  done once or twice a day        Glucometer:  true result Am 72-127 PCS 187-251  Hypoglycemia: None      Glycemic  control:  A1c on 08/02/13 was 9.4, previous levels from PCP not available  Lab Results  Component Value Date   HGBA1C 8.1* 12/09/2012   Lab Results  Component Value Date   CREATININE 2.9* 09/22/2013   Recent creatinine was 3.5 in 5/15 Urine microalbumin report not available  Self-care: The diet that the patient has been following is: None.      Meals: 3 meals per day. Bfst eggs, grits or pancakes; rarely will have higher fat meals like chicken wings           Exercise:  none, unable to         Dietician visit: Most recent: Unknown.               Retinal exam: Most recent: Within the last year  Weight history: Wt Readings from Last 3 Encounters:  09/06/13 204 lb 9.6 oz (92.806 kg)  08/25/13 205 lb (92.987 kg)  08/23/13 207 lb 6.4 oz (94.076 kg)      Medication List       This list is accurate as of: 09/29/13 10:35 AM.  Always use your most recent med list.               amiodarone 200 MG tablet  Commonly known as:  PACERONE  TAKE 1 TABLET (200 MG TOTAL) BY MOUTH DAILY.  amLODipine 10 MG tablet  Commonly known as:  NORVASC     atorvastatin 40 MG tablet  Commonly known as:  LIPITOR  Take 40 mg by mouth daily.     bisacodyl 10 MG suppository  Commonly known as:  DULCOLAX  Place 1 suppository (10 mg total) rectally daily as needed.     CALCIUM 1200 PO  Take 1,200 mg by mouth daily.     CENTRUM SILVER ADULT 50+ Tabs  Take by mouth.     cephALEXin 500 MG capsule  Commonly known as:  KEFLEX  Take 1 capsule (500 mg total) by mouth 2 (two) times daily.     Cranberry 250 MG Caps  Take 250 mg by mouth daily.     diltiazem 360 MG 24 hr capsule  Commonly known as:  CARDIZEM CD  Take 1 capsule (360 mg total) by mouth daily.     econazole nitrate 1 % cream  Apply 1 application topically daily as needed (to groin rash as needed).     esomeprazole 20 MG capsule  Commonly known as:  NEXIUM  Take 40 mg by mouth daily before breakfast.     ferrous sulfate 325 (65  FE) MG tablet  Take 325 mg by mouth daily with breakfast.     fluticasone 50 MCG/ACT nasal spray  Commonly known as:  FLONASE  Place 2 sprays into the nose daily as needed for allergies.     furosemide 80 MG tablet  Commonly known as:  LASIX  Take 80 mg by mouth 3 (three) times daily.     glipiZIDE 10 MG 24 hr tablet  Commonly known as:  GLUCOTROL XL  Take 10 mg by mouth daily.     glucose blood test strip  Commonly known as:  ONETOUCH VERIO  Use as instructed to check blood sugar 2 times per day dx code 250.02     metolazone 5 MG tablet  Commonly known as:  ZAROXOLYN  Take 5 mg by mouth every Monday, Wednesday, and Friday.     metoprolol 50 MG tablet  Commonly known as:  LOPRESSOR  Take 50 mg by mouth 2 (two) times daily.     multivitamin with minerals Tabs tablet  Take 1 tablet by mouth daily.     ONETOUCH DELICA LANCETS FINE Misc  Use to check blood sugar 2 times per day dx code 250.02     pantoprazole 20 MG tablet  Commonly known as:  PROTONIX  Take 20 mg by mouth daily.     potassium chloride 10 MEQ tablet  Commonly known as:  K-DUR,KLOR-CON  Take 10 mEq by mouth daily. TAKE 3 TABS A.M.   AND 2 TABS IN THE P.M.     VICTOZA 18 MG/3ML Sopn  Generic drug:  Liraglutide  Inject 1.2 mg into the skin daily. Inject once daily at the same time     warfarin 5 MG tablet  Commonly known as:  COUMADIN  Take as directed by coumadin clinic        Allergies:  Allergies  Allergen Reactions  . Lotensin [Benazepril Hcl] Anaphylaxis  . Zantac [Ranitidine Hcl] Anaphylaxis    Past Medical History  Diagnosis Date  . Hypertension   . Diabetes mellitus without complication   . Irregular heart beat   . Renal disorder   . Ileus, postoperative 12/17/2012    Past Surgical History  Procedure Laterality Date  . Hernia repair    . Right lumpectomy    . Cataract  extraction, bilateral    . Ventral hernia repair N/A 12/12/2012    Procedure: LAPAROSCOPIC VENTRAL HERNIA;   Surgeon: Madilyn Hook, DO;  Location: [facility];  Service: General;  Laterality: N/A;  . Insertion of mesh N/A 12/12/2012    Procedure: INSERTION OF MESH;  Surgeon: Madilyn Hook, DO;  Location: [facility];  Service: General;  Laterality: N/A;  . Laparoscopic lysis of adhesions N/A 12/12/2012    Procedure: LAPAROSCOPIC LYSIS OF ADHESIONS;  Surgeon: Madilyn Hook, DO;  Location: [facility];  Service: General;  Laterality: N/A;    Family History  Problem Relation Age of Onset  . Diabetes Mother   . Diabetes Sister     Social History:  reports that she has quit smoking. Her smoking use included Cigarettes. She smoked 0.00 packs per day. She has never used smokeless tobacco. She reports that she does not drink alcohol or use illicit drugs.    Review of Systems       Lipids: She has been on high dose Lipitor for this, recent LDL was 34 from PCP       No results found for this basename: CHOL,  HDL,  LDLCALC,  LDLDIRECT,  TRIG,  CHOLHDL       The blood pressure has been treated with multiple medications and blood pressure still appears low despite stopping doxazosin     She has had history of swelling of feet and is on Lasix; currently not taking metolazone; managed by PCP and nephrologist.  Now has minimal residual swelling on her left leg  Potassium 2.7 recently despite taking 5 tablets a day     Chronic kidney disease: This appears to be worse recently, followed by a nephrologist. Not clear of the etiology of this condition     She has weakness in her legs since last year, needs a walker and cannot walk very long  No history of thyroid disease  LABS:  No visits with results within 1 Week(s) from this visit. Latest known visit with results is:  Appointment on 09/22/2013  Component Date Value Ref Range Status  . Fructosamine 09/22/2013 339* 190 - 270 umol/L Final  . Sodium 09/22/2013 134* 135 - 145 mEq/L Final  . Potassium 09/22/2013 2.7* 3.5 - 5.1 mEq/L Final  . Chloride 09/22/2013 92* 96  - 112 mEq/L Final  . CO2 09/22/2013 31  19 - 32 mEq/L Final  . Glucose, Bld 09/22/2013 235* 70 - 99 mg/dL Final  . BUN 09/22/2013 67* 6 - 23 mg/dL Final  . Creatinine, Ser 09/22/2013 2.9* 0.4 - 1.2 mg/dL Final  . Calcium 09/22/2013 9.2  8.4 - 10.5 mg/dL Final  . GFR 09/22/2013 20.43* >60.00 mL/min Final    Physical Examination:  BP 99/57  Pulse 63  Temp(Src) 98.6 F (37 C)  Resp 16  Ht 5\' 6"  (1.676 m)  SpO2 96%  Edema left foot 1+ and none on right  ASSESSMENT:  Diabetes type 2, uncontrolled    She has had a very long history of diabetes which had shown progression and A1c of over 9% on oral hypoglycemic drugs Her blood sugars are fairly good with Victoza monotherapy 1.2 mg but still tending to be high after meals, at least breakfast and supper with frequent readings over 200 She has tried to improve her diet but needs more formal education Also she is not able to lose weight with her inability to exercise She is currently using a generic monitor  for her testing and not clear how  accurate this is   PLAN:   She will be continued on Victoza  1.2 mg daily for now Will try her on Prandin before breakfast and supper starting with 1 mg and increasing if needed as outlined in patient instructions This should help herpostprandial hyperglycemia She will call if she has any hypoglycemia or continued high readings Discussed blood sugar targets, need for checking blood sugars 2 hours after various meals Prescription sent for her brand name strips for the new monitor, this will be downloaded on the next visit and probably give more accurate readings  HYPOKALEMIA: This is secondary to diuretics and currently she appears to be having excessive volume depletion with low normal blood pressure, no significant swelling of her legs except likely related to venous insufficiency on the left Also creatinine is relatively high from her diuretics Treatment as follows but she needs to followup with  her PCP or nephrologist:  Patient Instructions  Leave off 1/2 pill Lasix in pm; Potassium 3 pills 2x daily  Start Prandin 1mg  just before Bfst and supper  If sugar still going over 180 after meals, then go to 2 pills before meals    Counseling time over 50% of today's 25 minute visit   Kynnedy Carreno 09/29/2013, 10:35 AM   Note: This office note was prepared with Estate agent. Any transcriptional errors that result from this process are unintentional.

## 2013-09-29 NOTE — Patient Instructions (Signed)
Leave off 1/2 pill Lasix in pm; Potassium 3 pills 2x daily  Start Prandin 1mg  just before Bfst and supper  If sugar still going over 180 after meals, then go to 2 pills before meals

## 2013-10-20 ENCOUNTER — Telehealth: Payer: Self-pay | Admitting: Endocrinology

## 2013-10-20 ENCOUNTER — Other Ambulatory Visit: Payer: Self-pay | Admitting: *Deleted

## 2013-10-20 MED ORDER — GLUCOSE BLOOD VI STRP: ORAL_STRIP | Status: DC

## 2013-10-20 NOTE — Telephone Encounter (Signed)
rx sent to [facility]

## 2013-10-20 NOTE — Telephone Encounter (Signed)
Patient stated that Her insurance company will not work with [facility] anymore, please send original prescription to [facility] on [address],  One touch [facility], test strips.  Thank you

## 2013-10-25 ENCOUNTER — Ambulatory Visit (INDEPENDENT_AMBULATORY_CARE_PROVIDER_SITE_OTHER): Payer: Medicare Other | Admitting: Pharmacist Clinician (PhC)/ Clinical Pharmacy Specialist

## 2013-10-25 ENCOUNTER — Encounter: Payer: Medicare Other | Attending: Endocrinology | Admitting: *Deleted

## 2013-10-25 ENCOUNTER — Encounter: Payer: Self-pay | Admitting: *Deleted

## 2013-10-25 VITALS — Ht 66.0 in | Wt 199.6 lb

## 2013-10-25 DIAGNOSIS — N184 Chronic kidney disease, stage 4 (severe): Secondary | ICD-10-CM | POA: Insufficient documentation

## 2013-10-25 DIAGNOSIS — E1129 Type 2 diabetes mellitus with other diabetic kidney complication: Secondary | ICD-10-CM | POA: Diagnosis not present

## 2013-10-25 DIAGNOSIS — Z5181 Encounter for therapeutic drug level monitoring: Secondary | ICD-10-CM

## 2013-10-25 DIAGNOSIS — I4891 Unspecified atrial fibrillation: Secondary | ICD-10-CM

## 2013-10-25 DIAGNOSIS — IMO0001 Reserved for inherently not codable concepts without codable children: Secondary | ICD-10-CM

## 2013-10-25 DIAGNOSIS — I129 Hypertensive chronic kidney disease with stage 1 through stage 4 chronic kidney disease, or unspecified chronic kidney disease: Secondary | ICD-10-CM | POA: Diagnosis not present

## 2013-10-25 DIAGNOSIS — Z713 Dietary counseling and surveillance: Secondary | ICD-10-CM | POA: Diagnosis not present

## 2013-10-25 DIAGNOSIS — E1165 Type 2 diabetes mellitus with hyperglycemia: Secondary | ICD-10-CM

## 2013-10-25 LAB — POCT INR
INR: 2.6
INR: 2.6

## 2013-10-25 NOTE — Patient Instructions (Signed)
Plan:  Aim for 2 Carb Choices per meal (30 grams) +/- 1 either way  Aim for 0-1 Carbs per snack if hungry  Include protein in moderation with your meals and snacks Consider  increasing your activity level by Arm Chair Exercises for 5 minutes after each meal daily as tolerated Continue checking BG at alternate times per day as directed by MD  Continue taking medication as directed by MD

## 2013-10-25 NOTE — Progress Notes (Signed)
  Medical Nutrition Therapy:  Appt start time: 1000 end time:  1100.  Assessment:  Primary concerns today: 10/25/13. She is here with her daughter and her daughter's girlfriend. She states she has had Diabetes for about 40 years. She states she SMBG every AM and after one other meal each day. She reports her BG range varies from low 100's in AM to 250 mg/dl pre and post meals. She states she is taking only 1 of the Prandin before meals so far. She lives in her own home with her husband; Her daughter is there often too. Her daughter shops for her groceries and patient prepares the meals.    Preferred Learning Style:   No preference indicated   Learning Readiness:   Ready  Change in progress  MEDICATIONS: see list, diabetes medications are Victoza, Prandin and Glipizide   DIETARY INTAKE:  24-hr recall:  B ( AM): eggs, grits, bacon or sausage, OR pancakes with meat and eggs OR oatmeal with cinnamon toast, 4 oz juice and coffee with Splenda and creamer Snk ( AM): no  L ( PM): 1/2 or 1 sandwich, water Snk ( PM): no D ( PM): meat, starch, vegetable, occasionally small piece of bread, 1/2 regular Gingerale Snk ( PM): graham crackers OR occasionally cookies or cake or ice cream Beverages: coffee, water, OJ/ mango juice and Gingerale,  Usual physical activity: limited, she does walk around her house some, prepares her meals,   Estimated energy needs: 1200 calories 135 g carbohydrates 90 g protein 33 g fat  Progress Towards Goal(s):  In progress.   Nutritional Diagnosis:  NB-1.1 Food and nutrition-related knowledge deficit As related to Diabetes.  As evidenced by A1c of 9.4% on 08/02/13.    Intervention:  Nutrition counseling and diabetes education initiated. Discussed Carb Counting as method of portion control, and benefits of increased activity. Suggested she consider Arm Chair Exercies as a safe way to increase her activity level in her own home.  Plan:  Aim for 2 Carb Choices per  meal (30 grams) +/- 1 either way  Aim for 0-1 Carbs per snack if hungry  Include protein in moderation with your meals and snacks Consider  increasing your activity level by Arm Chair Exercises for 5 minutes after each meal daily as tolerated Continue checking BG at alternate times per day as directed by MD  Continue taking medication as directed by MD  Teaching Method Utilized: Visual, Auditory and Hands on  Handouts given during visit include: Carb Counting and Food Label handouts Meal Plan Card  Barriers to learning/adherence to lifestyle change: gradual aging process and having to depend on others more  Demonstrated degree of understanding via:  Teach Back   Monitoring/Evaluation:  Dietary intake, exercise, SMBG, and body weight prn.

## 2013-10-27 ENCOUNTER — Encounter: Payer: Self-pay | Admitting: Endocrinology

## 2013-10-27 ENCOUNTER — Other Ambulatory Visit: Payer: Self-pay | Admitting: *Deleted

## 2013-10-27 ENCOUNTER — Other Ambulatory Visit: Payer: Medicare Other

## 2013-10-27 ENCOUNTER — Ambulatory Visit (INDEPENDENT_AMBULATORY_CARE_PROVIDER_SITE_OTHER): Payer: Medicare Other | Admitting: Endocrinology

## 2013-10-27 ENCOUNTER — Other Ambulatory Visit (INDEPENDENT_AMBULATORY_CARE_PROVIDER_SITE_OTHER): Payer: Medicare Other

## 2013-10-27 VITALS — BP 120/56 | HR 56 | Temp 98.3°F | Resp 16

## 2013-10-27 DIAGNOSIS — E876 Hypokalemia: Secondary | ICD-10-CM

## 2013-10-27 DIAGNOSIS — E1165 Type 2 diabetes mellitus with hyperglycemia: Principal | ICD-10-CM

## 2013-10-27 DIAGNOSIS — E785 Hyperlipidemia, unspecified: Secondary | ICD-10-CM

## 2013-10-27 DIAGNOSIS — IMO0001 Reserved for inherently not codable concepts without codable children: Secondary | ICD-10-CM

## 2013-10-27 DIAGNOSIS — N184 Chronic kidney disease, stage 4 (severe): Secondary | ICD-10-CM

## 2013-10-27 LAB — COMPREHENSIVE METABOLIC PANEL
ALBUMIN: 3 g/dL — AB (ref 3.5–5.2)
ALK PHOS: 48 U/L (ref 39–117)
ALT: 61 U/L — ABNORMAL HIGH (ref 0–35)
AST: 108 U/L — ABNORMAL HIGH (ref 0–37)
BUN: 60 mg/dL — AB (ref 6–23)
CO2: 26 mEq/L (ref 19–32)
Calcium: 9.2 mg/dL (ref 8.4–10.5)
Chloride: 96 mEq/L (ref 96–112)
Creatinine, Ser: 2.5 mg/dL — ABNORMAL HIGH (ref 0.4–1.2)
GFR: 24.18 mL/min — ABNORMAL LOW (ref >60.00)
Glucose, Bld: 248 mg/dL — ABNORMAL HIGH (ref 70–99)
Potassium: 3.2 mEq/L — ABNORMAL LOW (ref 3.5–5.1)
Sodium: 134 mEq/L — ABNORMAL LOW (ref 135–145)
Total Bilirubin: 0.4 mg/dL (ref 0.2–1.2)
Total Protein: 6.8 g/dL (ref 6.0–8.3)

## 2013-10-27 LAB — HEMOGLOBIN A1C: Hgb A1c MFr Bld: 8.2 % — ABNORMAL HIGH (ref 4.6–6.5)

## 2013-10-27 LAB — LDL CHOLESTEROL, DIRECT: LDL DIRECT: 37.8 mg/dL

## 2013-10-27 LAB — GLUCOSE, POCT (MANUAL RESULT ENTRY): POC Glucose: 233 mg/dl — AB (ref 70–99)

## 2013-10-27 MED ORDER — INSULIN LISPRO 100 UNIT/ML (KWIKPEN): PEN_INJECTOR | SUBCUTANEOUS | Status: DC

## 2013-10-27 MED ORDER — INSULIN PEN NEEDLE 31G X 5 MM MISC: Status: DC

## 2013-10-27 MED ORDER — INSULIN GLARGINE 100 UNIT/ML SOLOSTAR PEN: PEN_INJECTOR | SUBCUTANEOUS | Status: DC

## 2013-10-27 NOTE — Patient Instructions (Addendum)
Novolog 3 units before Bfst and 4 before dinner, take this just before eating  If Sugar 2 hrs later is still over 180 go up 1 units   Stop Prandin  Please check blood sugars at least once daily about 2 hours after any meal and 2 times per week on waking up. Please bring blood sugar monitor to each visit

## 2013-10-27 NOTE — Progress Notes (Signed)
Quick Note:  Please let patient know that the potassium result is still low and needs to stop evening furosemide ______

## 2013-10-27 NOTE — Progress Notes (Signed)
Patient ID: [name], female   DOB: Apr 12, 1933, 80 y.o.   MRN: [id number]    Reason for Appointment: F/u for Type 2 Diabetes  Referring physician: Jordan Hawks Rankins  History of Present Illness:          Diagnosis: Type 2 diabetes mellitus, date of diagnosis: ? 66 years ago        Past history: She was initially treated with Metformin but this was done because it caused GI side effects  May also tried Actos at some point, probably started because of the fear of side effects but did not know if it helped Actos stopped ? Reason Over the last few years she has been taking Precose with meals and also Januvia in increasing doses Apparently her blood sugars have been higher since 9/14 when she had abdominal surgery and before that they were as low as 70.  On her initial consultation in 6/15 because of her renal dysfunction her medication regimen was changed. Previously was taking Januvia, Precose and glipizide without adequate control; her blood sugars were occasionally over 200 fasting and A1c was 9.4 She was started on Victoza 0.6 mg along with Lantus insulin 8 units a day  Recent history:  With Lantus her blood sugars were high after meals but relatively low fasting; her insulin was stopped and Victoza was increased to 1.2 mg.  Even though she was continued on glipizide her postprandial readings continue to be high On her last visit she was given a trial of Prandin 1 mg before supper Blood sugar records were reviewed from the patient's home diary Currently her blood sugars are running fairly good in the morning but fairly consistently high after meal  in the evening and also somewhat high after lunch She has had no side effects from Victoza and had no difficulty injecting this and no nausea      Oral hypoglycemic drugs the patient is taking are: Glipizide, Prandin    Side effects from medications have been: None     Glucose monitoring:  done once or twice a day        Glucometer:  true  result Am 68-164, two-hour pcs 170-330  Hypoglycemia: None      Glycemic control:  A1c on 08/02/13 was 9.4, previous levels from PCP not available  Lab Results  Component Value Date   HGBA1C 8.2* 10/27/2013   HGBA1C 8.1* 12/09/2012   Lab Results  Component Value Date   CREATININE 2.5* 10/27/2013   Urine microalbumin report not available  Self-care: The diet that the patient has been following is: None.      Meals: 3 meals per day. Breakfast: eggs, grits or pancakes; rarely will have higher fat meals like chicken wings, sometimes will have spaghetti with meat sauce at supper           Exercise:  none, unable to         Dietician visit: Most recent: Unknown.               Retinal exam: Most recent: Within the last year  Weight history: Wt Readings from Last 3 Encounters:  10/25/13 199 lb 9.6 oz (90.538 kg)  09/06/13 204 lb 9.6 oz (92.806 kg)  08/25/13 205 lb (92.987 kg)      Medication List       This list is accurate as of: 10/27/13  1:45 PM.  Always use your most recent med list.  amiodarone 200 MG tablet  Commonly known as:  PACERONE  TAKE 1 TABLET (200 MG TOTAL) BY MOUTH DAILY.     amLODipine 10 MG tablet  Commonly known as:  NORVASC     atorvastatin 40 MG tablet  Commonly known as:  LIPITOR  Take 40 mg by mouth daily.     bisacodyl 10 MG suppository  Commonly known as:  DULCOLAX  Place 1 suppository (10 mg total) rectally daily as needed.     CALCIUM 1200 PO  Take 1,200 mg by mouth daily.     CENTRUM SILVER ADULT 50+ Tabs  Take by mouth.     Cranberry 250 MG Caps  Take 250 mg by mouth daily.     diltiazem 360 MG 24 hr capsule  Commonly known as:  CARDIZEM CD  Take 1 capsule (360 mg total) by mouth daily.     econazole nitrate 1 % cream  Apply 1 application topically daily as needed (to groin rash as needed).     esomeprazole 20 MG capsule  Commonly known as:  NEXIUM  Take 40 mg by mouth daily before breakfast.     ferrous sulfate  325 (65 FE) MG tablet  Take 325 mg by mouth daily with breakfast.     fluticasone 50 MCG/ACT nasal spray  Commonly known as:  FLONASE  Place 2 sprays into the nose daily as needed for allergies.     furosemide 80 MG tablet  Commonly known as:  LASIX  Take 80 mg by mouth daily. 1 am , 1/2 pm     glipiZIDE 10 MG 24 hr tablet  Commonly known as:  GLUCOTROL XL  Take 10 mg by mouth daily.     glucose blood test strip  Commonly known as:  ONETOUCH VERIO  Use as instructed to check blood sugar 2 times per day dx code 250.02     insulin lispro 100 UNIT/ML KiwkPen  Commonly known as:  HUMALOG  Inject 5 units as directed with meals     Insulin Pen Needle 31G X 5 MM Misc  Use 3 pen needles per day     metolazone 5 MG tablet  Commonly known as:  ZAROXOLYN  Take 5 mg by mouth every Monday, Wednesday, and Friday.     metoprolol 50 MG tablet  Commonly known as:  LOPRESSOR  Take 50 mg by mouth 2 (two) times daily.     multivitamin with minerals Tabs tablet  Take 1 tablet by mouth daily.     ONETOUCH DELICA LANCETS FINE Misc  Use to check blood sugar 2 times per day dx code 250.02     pantoprazole 20 MG tablet  Commonly known as:  PROTONIX  Take 20 mg by mouth daily.     potassium chloride 10 MEQ tablet  Commonly known as:  K-DUR,KLOR-CON  Take 10 mEq by mouth daily. TAKE 3 TABS A.M.   AND 2 TABS IN THE P.M.     VICTOZA 18 MG/3ML Sopn  Generic drug:  Liraglutide  Inject 1.2 mg into the skin daily. Inject once daily at the same time     warfarin 5 MG tablet  Commonly known as:  COUMADIN  Take as directed by coumadin clinic        Allergies:  Allergies  Allergen Reactions  . Lotensin [Benazepril Hcl] Anaphylaxis  . Zantac [Ranitidine Hcl] Anaphylaxis    Past Medical History  Diagnosis Date  . Hypertension   . Diabetes mellitus without complication   .  Irregular heart beat   . Renal disorder   . Ileus, postoperative 12/17/2012    Past Surgical History  Procedure  Laterality Date  . Hernia repair    . Right lumpectomy    . Cataract extraction, bilateral    . Ventral hernia repair N/A 12/12/2012    Procedure: LAPAROSCOPIC VENTRAL HERNIA;  Surgeon: Madilyn Hook, DO;  Location: [facility];  Service: General;  Laterality: N/A;  . Insertion of mesh N/A 12/12/2012    Procedure: INSERTION OF MESH;  Surgeon: Madilyn Hook, DO;  Location: [facility];  Service: General;  Laterality: N/A;  . Laparoscopic lysis of adhesions N/A 12/12/2012    Procedure: LAPAROSCOPIC LYSIS OF ADHESIONS;  Surgeon: Madilyn Hook, DO;  Location: [facility];  Service: General;  Laterality: N/A;    Family History  Problem Relation Age of Onset  . Diabetes Mother   . Diabetes Sister     Social History:  reports that she has quit smoking. Her smoking use included Cigarettes. She smoked 0.00 packs per day. She has never used smokeless tobacco. She reports that she does not drink alcohol or use illicit drugs.    Review of Systems       Lipids: She has been on high dose Lipitor for this, recent LDL was 34 from PCP       No results found for this basename: CHOL,  HDL,  LDLCALC,  LDLDIRECT,  TRIG,  CHOLHDL       The blood pressure has been treated with multiple medications and blood pressure still appears low despite stopping doxazosin     She has had history of swelling of feet and is on Lasix; currently not taking metolazone; managed by PCP and nephrologist.  Now has minimal residual swelling on her left leg  Potassium 2.7 recently despite taking 5 tablets a day     Chronic kidney disease: This appears to be worse recently, followed by a nephrologist. Not clear of the etiology of this condition     She has weakness in her legs since last year, needs a walker and cannot walk very long  No history of thyroid disease  LABS:  Office Visit on 10/27/2013  Component Date Value Ref Range Status  . POC Glucose 10/27/2013 233* 70 - 99 mg/dl Final  Appointment on 10/27/2013  Component Date Value  Ref Range Status  . Hemoglobin A1C 10/27/2013 8.2* 4.6 - 6.5 % Final   Glycemic Control Guidelines for People with Diabetes:Non Diabetic:  <6%Goal of Therapy: <7%Additional Action Suggested:  >8%   . Sodium 10/27/2013 134* 135 - 145 mEq/L Final  . Potassium 10/27/2013 3.2* 3.5 - 5.1 mEq/L Final  . Chloride 10/27/2013 96  96 - 112 mEq/L Final  . CO2 10/27/2013 26  19 - 32 mEq/L Final  . Glucose, Bld 10/27/2013 248* 70 - 99 mg/dL Final  . BUN 10/27/2013 60* 6 - 23 mg/dL Final  . Creatinine, Ser 10/27/2013 2.5* 0.4 - 1.2 mg/dL Final  . Total Bilirubin 10/27/2013 0.4  0.2 - 1.2 mg/dL Final  . Alkaline Phosphatase 10/27/2013 48  39 - 117 U/L Final  . AST 10/27/2013 108* 0 - 37 U/L Final  . ALT 10/27/2013 61* 0 - 35 U/L Final  . Total Protein 10/27/2013 6.8  6.0 - 8.3 g/dL Final  . Albumin 10/27/2013 3.0* 3.5 - 5.2 g/dL Final  . Calcium 10/27/2013 9.2  8.4 - 10.5 mg/dL Final  . GFR 10/27/2013 24.18* >60.00 mL/min Final  . Direct LDL 10/27/2013 37.8  Final   Optimal:  <100 mg/dLNear or Above Optimal:  100-129 mg/dLBorderline High:  130-159 mg/dLHigh:  160-189 mg/dLVery High:  >190 mg/dL  Anti-coag visit on 10/25/2013  Component Date Value Ref Range Status  . INR 10/25/2013 2.6   Corrected  . INR 10/25/2013 2.6   Final    Physical Examination:  BP 120/56  Pulse 56  Temp(Src) 98.3 F (36.8 C)  Resp 16  SpO2 99%  Edema left foot 1+ and none on right  ASSESSMENT:  Diabetes type 2, uncontrolled    She has had a very long history of diabetes which had shown progression and A1c of over 9% on oral hypoglycemic drugs Her fasting blood sugars are fairly good with Victoza monotherapy 1.2 mg but still tending to be high after meals, at least breakfast and supper with frequent readings over 200 This is despite taking Prandin and glipizide She is still using a generic monitor  for her testing and not clear how accurate this is However blood sugar in the office today is over 200 with eating  breakfast Her meals are fairly good although occasionally high carbohydrate  PLAN:   She will be continued on Victoza  1.2 mg daily for now Will try her on mealtime insulin with Humalog starting with 3 units at breakfast and 4 units at dinnertime. May consider doing this at lunch also if needed. Discussed in detail how to use the insulin pen, injection sites and timing of the injection. Discussed duration of action of the fast acting insulin This should help herpostprandial hyperglycemia She will call if she has any hypoglycemia or continued high readings Discussed blood sugar ranges desired, need for checking blood sugars 2 hours after breakfast or lunch also She will get her brand name strips for the new monitor, this will be downloaded on the next visit and probably give more accurate readings  HYPOKALEMIA: This is secondary to diuretics and has not discussed the dosage is with her prescribing physicians Will check her potassium again today and decide on dosage Also she needs to followup with her PCP or nephrologist:  Patient Instructions  Novolog 3 units before Bfst and 4 before dinner, take this just before eating  If Sugar 2 hrs later is still over 180 go up 1 units   Stop Prandin  Please check blood sugars at least once daily about 2 hours after any meal and 2 times per week on waking up. Please bring blood sugar monitor to each visit    Counseling time over 50% of today's 25 minute visit   Jash Wahlen 10/27/2013, 1:45 PM   Note: This office note was prepared with Estate agent. Any transcriptional errors that result from this process are unintentional.   Addendum: Potassium still low, she will stop the evening Lasix especially since creatinine is still relatively high A1c 8.2  Office Visit on 10/27/2013  Component Date Value Ref Range Status  . POC Glucose 10/27/2013 233* 70 - 99 mg/dl Final  Appointment on 10/27/2013  Component Date Value  Ref Range Status  . Hemoglobin A1C 10/27/2013 8.2* 4.6 - 6.5 % Final   Glycemic Control Guidelines for People with Diabetes:Non Diabetic:  <6%Goal of Therapy: <7%Additional Action Suggested:  >8%   . Sodium 10/27/2013 134* 135 - 145 mEq/L Final  . Potassium 10/27/2013 3.2* 3.5 - 5.1 mEq/L Final  . Chloride 10/27/2013 96  96 - 112 mEq/L Final  . CO2 10/27/2013 26  19 - 32 mEq/L Final  .  Glucose, Bld 10/27/2013 248* 70 - 99 mg/dL Final  . BUN 10/27/2013 60* 6 - 23 mg/dL Final  . Creatinine, Ser 10/27/2013 2.5* 0.4 - 1.2 mg/dL Final  . Total Bilirubin 10/27/2013 0.4  0.2 - 1.2 mg/dL Final  . Alkaline Phosphatase 10/27/2013 48  39 - 117 U/L Final  . AST 10/27/2013 108* 0 - 37 U/L Final  . ALT 10/27/2013 61* 0 - 35 U/L Final  . Total Protein 10/27/2013 6.8  6.0 - 8.3 g/dL Final  . Albumin 10/27/2013 3.0* 3.5 - 5.2 g/dL Final  . Calcium 10/27/2013 9.2  8.4 - 10.5 mg/dL Final  . GFR 10/27/2013 24.18* >60.00 mL/min Final  . Direct LDL 10/27/2013 37.8   Final   Optimal:  <100 mg/dLNear or Above Optimal:  100-129 mg/dLBorderline High:  130-159 mg/dLHigh:  160-189 mg/dLVery High:  >190 mg/dL  Anti-coag visit on 10/25/2013  Component Date Value Ref Range Status  . INR 10/25/2013 2.6   Corrected  . INR 10/25/2013 2.6   Final

## 2013-11-08 ENCOUNTER — Other Ambulatory Visit: Payer: Self-pay | Admitting: *Deleted

## 2013-11-08 ENCOUNTER — Telehealth: Payer: Self-pay | Admitting: Endocrinology

## 2013-11-08 MED ORDER — POTASSIUM CHLORIDE CRYS ER 10 MEQ PO TBCR: EXTENDED_RELEASE_TABLET | ORAL | Status: DC

## 2013-11-08 NOTE — Telephone Encounter (Signed)
Patient states she is taking Klorcon 3 in am 3 in evening  Patient would like a refill   [facility]   Thank You

## 2013-11-08 NOTE — Telephone Encounter (Signed)
Okay for the 30 day prescription only. Please let her known that they should be now prescribed by her PCP or cardiologist

## 2013-11-22 ENCOUNTER — Ambulatory Visit (INDEPENDENT_AMBULATORY_CARE_PROVIDER_SITE_OTHER): Payer: Medicare Other | Admitting: Endocrinology

## 2013-11-22 ENCOUNTER — Encounter: Payer: Self-pay | Admitting: Endocrinology

## 2013-11-22 VITALS — BP 114/58 | HR 61 | Temp 97.8°F | Resp 14

## 2013-11-22 DIAGNOSIS — IMO0001 Reserved for inherently not codable concepts without codable children: Secondary | ICD-10-CM

## 2013-11-22 DIAGNOSIS — R600 Localized edema: Secondary | ICD-10-CM

## 2013-11-22 DIAGNOSIS — R609 Edema, unspecified: Secondary | ICD-10-CM

## 2013-11-22 DIAGNOSIS — N184 Chronic kidney disease, stage 4 (severe): Secondary | ICD-10-CM

## 2013-11-22 DIAGNOSIS — E1165 Type 2 diabetes mellitus with hyperglycemia: Principal | ICD-10-CM

## 2013-11-22 MED ORDER — GLIPIZIDE ER 2.5 MG PO TB24: 2.5000 mg | ORAL_TABLET | Freq: Every day | ORAL | Status: DC

## 2013-11-22 NOTE — Patient Instructions (Addendum)
Please check blood sugars at least half the time about 2 hours after any meal and times 3 per week on waking up.  Please bring blood sugar monitor to each visit  If sugar over 200 at 9-10 in am increase am insulin to 4 units  Insulin at supper is 5 units  Call sugars in 1 week

## 2013-11-22 NOTE — Progress Notes (Signed)
Patient ID: [name], female   DOB: 1934-02-08, 80 y.o.   MRN: [id number]    Reason for Appointment: F/u for Type 2 Diabetes  Referring physician: Jordan Hawks Rankins  History of Present Illness:          Diagnosis: Type 2 diabetes mellitus, date of diagnosis: ? 60 years ago        Past history: She was initially treated with Metformin but this was done because it caused GI side effects  May also tried Actos at some point, probably started because of the fear of side effects but did not know if it helped Actos stopped ? Reason Over the last few years she has been taking Precose with meals and also Januvia in increasing doses Apparently her blood sugars have been higher since 9/14 when she had abdominal surgery and before that they were as low as 70. On her initial consultation in 6/15 because of her renal dysfunction her medication regimen was changed. Previously was taking Januvia, Precose and glipizide without adequate control; her blood sugars were occasionally over 200 fasting and A1c was 9.4 She was started on Victoza 0.6 mg and Lantus insulin 8 units a day but the Lantus had to be stopped because of relatively low fasting glucose  Recent history:  With glipizide and Victoza her postprandial readings were still quite high and she did not have any improvement with trying Prandin at supper  She has had no side effects from Victoza and had no difficulty injecting this and no nausea A1c was still over 8% in 8/15 Her blood sugars were as high as 330 after supper and high after breakfast in the office she was started on low-dose Humalog before meals She is taking this consistently before eating as directed without any difficulty Her blood sugars are monitored mostly at bedtime and fasting and she is not using a brand-new monitor Although her fasting readings are relatively low including recent tendency to hypoglycemia she has readings averaging nearly 200 at night She thinks she is eating  3 balanced meals a day and has also lost some weight      Oral hypoglycemic drugs the patient is taking are: Glipizide    Side effects from medications have been: None INSULIN: Humalog 3 U acb, 4 acs  Glucose monitoring:  done once or twice a day        Glucometer:  One Touch Verio  PREMEAL Breakfast  PC Lunch Dinner Bedtime Overall  Glucose range: 60-107  152-196    169-235    Median:  70     200   97     Hypoglycemia: Asymptomatic readings in the mornings between 60-66      Glycemic control:  A1c on 08/02/13 was 9.4, previous levels from PCP not available  Lab Results  Component Value Date   HGBA1C 8.2* 10/27/2013   HGBA1C 8.1* 12/09/2012   Lab Results  Component Value Date   CREATININE 2.5* 10/27/2013   Urine microalbumin report not available  Self-care: The diet that the patient has been following is: None.      Meals: 3 meals per day. Breakfast: eggs, grits or pancakes; rarely will have higher fat meals like chicken wings, sometimes will have spaghetti with meat sauce at supper           Exercise:  none, unable to         Dietician visit: Most recent: Unknown.  Retinal exam: Most recent: Within the last year  Weight history: Wt Readings from Last 3 Encounters:  10/25/13 199 lb 9.6 oz (90.538 kg)  09/06/13 204 lb 9.6 oz (92.806 kg)  08/25/13 205 lb (92.987 kg)      Medication List       This list is accurate as of: 11/22/13 10:46 AM.  Always use your most recent med list.               amiodarone 200 MG tablet  Commonly known as:  PACERONE  TAKE 1 TABLET (200 MG TOTAL) BY MOUTH DAILY.     amLODipine 10 MG tablet  Commonly known as:  NORVASC     atorvastatin 40 MG tablet  Commonly known as:  LIPITOR  Take 40 mg by mouth daily.     bisacodyl 10 MG suppository  Commonly known as:  DULCOLAX  Place 1 suppository (10 mg total) rectally daily as needed.     CALCIUM 1200 PO  Take 1,200 mg by mouth daily.     CENTRUM SILVER ADULT 50+ Tabs  Take  by mouth.     Cranberry 250 MG Caps  Take 250 mg by mouth daily.     diltiazem 360 MG 24 hr capsule  Commonly known as:  CARDIZEM CD  Take 1 capsule (360 mg total) by mouth daily.     econazole nitrate 1 % cream  Apply 1 application topically daily as needed (to groin rash as needed).     esomeprazole 20 MG capsule  Commonly known as:  NEXIUM  Take 40 mg by mouth daily before breakfast.     ferrous sulfate 325 (65 FE) MG tablet  Take 325 mg by mouth daily with breakfast.     fluticasone 50 MCG/ACT nasal spray  Commonly known as:  FLONASE  Place 2 sprays into the nose daily as needed for allergies.     furosemide 80 MG tablet  Commonly known as:  LASIX  Take 80 mg by mouth daily. 1 am , 1/2 pm     glipiZIDE 2.5 MG 24 hr tablet  Commonly known as:  GLUCOTROL XL  Take 1 tablet (2.5 mg total) by mouth daily with breakfast.     glucose blood test strip  Commonly known as:  ONETOUCH VERIO  Use as instructed to check blood sugar 2 times per day dx code 250.02     insulin lispro 100 UNIT/ML KiwkPen  Commonly known as:  HUMALOG  Inject 5 units as directed with meals     Insulin Pen Needle 31G X 5 MM Misc  Use 3 pen needles per day     metolazone 5 MG tablet  Commonly known as:  ZAROXOLYN  Take 5 mg by mouth every Monday, Wednesday, and Friday.     metoprolol 50 MG tablet  Commonly known as:  LOPRESSOR  Take 50 mg by mouth 2 (two) times daily.     multivitamin with minerals Tabs tablet  Take 1 tablet by mouth daily.     ONETOUCH DELICA LANCETS FINE Misc  Use to check blood sugar 2 times per day dx code 250.02     pantoprazole 20 MG tablet  Commonly known as:  PROTONIX  Take 20 mg by mouth daily.     potassium chloride 10 MEQ tablet  Commonly known as:  K-DUR,KLOR-CON  TAKE 3 TABS A.M,  AND 3 TABS IN THE P.M.     VICTOZA 18 MG/3ML Sopn  Generic drug:  Liraglutide  Inject 1.2 mg into the skin daily. Inject once daily at the same time     warfarin 5 MG tablet   Commonly known as:  COUMADIN  Take as directed by coumadin clinic        Allergies:  Allergies  Allergen Reactions  . Lotensin [Benazepril Hcl] Anaphylaxis  . Zantac [Ranitidine Hcl] Anaphylaxis    Past Medical History  Diagnosis Date  . Hypertension   . Diabetes mellitus without complication   . Irregular heart beat   . Renal disorder   . Ileus, postoperative 12/17/2012    Past Surgical History  Procedure Laterality Date  . Hernia repair    . Right lumpectomy    . Cataract extraction, bilateral    . Ventral hernia repair N/A 12/12/2012    Procedure: LAPAROSCOPIC VENTRAL HERNIA;  Surgeon: Madilyn Hook, DO;  Location: [facility];  Service: General;  Laterality: N/A;  . Insertion of mesh N/A 12/12/2012    Procedure: INSERTION OF MESH;  Surgeon: Madilyn Hook, DO;  Location: [facility];  Service: General;  Laterality: N/A;  . Laparoscopic lysis of adhesions N/A 12/12/2012    Procedure: LAPAROSCOPIC LYSIS OF ADHESIONS;  Surgeon: Madilyn Hook, DO;  Location: [facility];  Service: General;  Laterality: N/A;    Family History  Problem Relation Age of Onset  . Diabetes Mother   . Diabetes Sister     Social History:  reports that she has quit smoking. Her smoking use included Cigarettes. She smoked 0.00 packs per day. She has never used smokeless tobacco. She reports that she does not drink alcohol or use illicit drugs.    Review of Systems       Lipids: She has been on high dose Lipitor for this, last LDL was 34 from PCP       No results found for this basename: CHOL,  HDL,  LDLCALC,  LDLDIRECT,  TRIG,  CHOLHDL       The blood pressure has been treated with multiple medications and blood pressure still appears low despite stopping doxazosin     She has had history of swelling of feet and is on Lasix; currently not taking metolazone; managed by PCP and nephrologist.  Because of increasing creatinine her evening Lasix was stopped on her last visit She has not had any increased swelling  especially on the right side She has swelling of her left leg towards the afternoons and evenings She cannot wear her elastic stocking because of tenderness of the left leg but does not complain of calf pain  Potassium has been low as 2.7; only somewhat improved with multiple potassium tablets a day, to be seen by nephrologist this week    Chronic kidney disease: This appears to be worse as of 8/15, followed by nephrologist. Not clear of the etiology of this condition     She has weakness in her legs since last year, needs a walker and cannot walk very long  No history of thyroid disease  LABS:  No visits with results within 1 Week(s) from this visit. Latest known visit with results is:  Office Visit on 10/27/2013  Component Date Value Ref Range Status  . POC Glucose 10/27/2013 233* 70 - 99 mg/dl Final    Physical Examination:  BP 114/58  Pulse 61  Temp(Src) 97.8 F (36.6 C)  Resp 14  SpO2 97%  Edema left lower leg 2+ and none on right She has redness and mild warmth of the left lower leg with stasis changes  Calf tenderness not present  ASSESSMENT:  Diabetes type 2, uncontrolled    She has had a very long history of diabetes which had shown progression and A1c of over 9% on oral hypoglycemic drugs With trying various agents including Prandin, glipizide and Victoza she continues to have postprandial hyperglycemia Now with starting low dose mealtime insulin her blood sugars are somewhat better although still averaging 200 at bedtime With improvement in her evening readings her fasting readings are now getting low with taking 10 mg glipizide ER She is usually watching her diet and is able to comply with her insulin regimen as discussed in history of present illness Last A1c was in 8/15  PLAN:   She will be continued on Victoza  1.2 mg daily but her glipizide will be reduced to 2.5 mg ER tablet.  She will increase her Humalog dose at least one unit at suppertime and  discussed that if blood sugars are still over 200 may need  6 units Also advised her on checking blood sugars after breakfast and to increase the dose as directed Her blood sugars after lunch or only slightly high and for simplicity will not start insulin at that time as yet Reminded her to eat modest amount of carbohydrates with some protein at each meal   She will call if blood sugars are consistently high or low  HYPOKALEMIA: This is secondary to diuretics and she will followup with nephrologist for instructions on her diuretics and potassium supplements this week  Patient Instructions  Please check blood sugars at least half the time about 2 hours after any meal and times 3 per week on waking up.  Please bring blood sugar monitor to each visit  If sugar over 200 at 9-10 in am increase am insulin to 4 units  Insulin at supper is 5 units    Counseling time over 50% of today's 25 minute visit   [name] 11/22/2013, 10:46 AM   Note: This office note was prepared with Estate agent. Any transcriptional errors that result from this process are unintentional.

## 2013-11-25 ENCOUNTER — Telehealth: Payer: Self-pay | Admitting: Endocrinology

## 2013-11-25 NOTE — Telephone Encounter (Signed)
Patient would like for you to please call her    Thank You

## 2013-11-29 ENCOUNTER — Ambulatory Visit (INDEPENDENT_AMBULATORY_CARE_PROVIDER_SITE_OTHER): Payer: Medicare Other | Admitting: Pharmacist

## 2013-11-29 DIAGNOSIS — Z5181 Encounter for therapeutic drug level monitoring: Secondary | ICD-10-CM

## 2013-11-29 DIAGNOSIS — I4891 Unspecified atrial fibrillation: Secondary | ICD-10-CM

## 2013-11-29 LAB — POCT INR: INR: 2.3

## 2013-12-06 NOTE — Telephone Encounter (Signed)
If her sugar is still below 90 in the mornings she needs to stop her glipizide, continue same doses of insulin

## 2013-12-06 NOTE — Telephone Encounter (Signed)
Please see below and advise.

## 2013-12-06 NOTE — Telephone Encounter (Signed)
Blood sugar readings from sept the 10th got so busy forgot to send it back. sorry   Wed  62  breakfast      131 Dinner Thur  61  Breakfast      184 2 hr after lunch Fri     83  Breakfast        Sat    86  breakfast       151  Bed time  Sun   71  Breakfast       117  Bed time Mon   88  Breakfast       132  Bed time Tue   78  Breakfast       147  Bed time Wed  99  Breakfast      147  Bed time Thur  65  breakfast       189  Bed time  Please advise on what to do

## 2013-12-07 ENCOUNTER — Other Ambulatory Visit: Payer: Self-pay | Admitting: Endocrinology

## 2013-12-08 NOTE — Telephone Encounter (Signed)
Noted, patient is aware. 

## 2013-12-23 ENCOUNTER — Other Ambulatory Visit: Payer: Self-pay | Admitting: Endocrinology

## 2013-12-27 ENCOUNTER — Other Ambulatory Visit: Payer: Self-pay | Admitting: *Deleted

## 2013-12-27 MED ORDER — GLUCOSE BLOOD VI STRP: ORAL_STRIP | Status: DC

## 2014-01-03 ENCOUNTER — Ambulatory Visit: Payer: Medicare Other | Admitting: Endocrinology

## 2014-01-10 ENCOUNTER — Ambulatory Visit (INDEPENDENT_AMBULATORY_CARE_PROVIDER_SITE_OTHER): Payer: Medicare Other | Admitting: *Deleted

## 2014-01-10 DIAGNOSIS — Z5181 Encounter for therapeutic drug level monitoring: Secondary | ICD-10-CM

## 2014-01-10 DIAGNOSIS — I4891 Unspecified atrial fibrillation: Secondary | ICD-10-CM

## 2014-01-10 LAB — POCT INR: INR: 2.5

## 2014-01-19 ENCOUNTER — Encounter: Payer: Self-pay | Admitting: *Deleted

## 2014-01-19 ENCOUNTER — Ambulatory Visit: Payer: Medicare Other | Admitting: Endocrinology

## 2014-01-19 LAB — HM DIABETES EYE EXAM

## 2014-01-21 ENCOUNTER — Other Ambulatory Visit: Payer: Self-pay | Admitting: Endocrinology

## 2014-01-21 ENCOUNTER — Other Ambulatory Visit: Payer: Self-pay | Admitting: Interventional Cardiology

## 2014-01-26 ENCOUNTER — Encounter: Payer: Self-pay | Admitting: Endocrinology

## 2014-01-26 ENCOUNTER — Ambulatory Visit (INDEPENDENT_AMBULATORY_CARE_PROVIDER_SITE_OTHER): Payer: Medicare Other | Admitting: Endocrinology

## 2014-01-26 VITALS — BP 125/63 | HR 67 | Temp 98.0°F | Resp 14

## 2014-01-26 DIAGNOSIS — E785 Hyperlipidemia, unspecified: Secondary | ICD-10-CM

## 2014-01-26 DIAGNOSIS — E1165 Type 2 diabetes mellitus with hyperglycemia: Secondary | ICD-10-CM

## 2014-01-26 DIAGNOSIS — N184 Chronic kidney disease, stage 4 (severe): Secondary | ICD-10-CM

## 2014-01-26 DIAGNOSIS — IMO0002 Reserved for concepts with insufficient information to code with codable children: Secondary | ICD-10-CM

## 2014-01-26 LAB — LIPID PANEL
Cholesterol: 118 mg/dL (ref 0–200)
HDL: 46.7 mg/dL (ref >39.00)
LDL CALC: 40 mg/dL (ref 0–99)
NonHDL: 71.3
Total CHOL/HDL Ratio: 3
Triglycerides: 155 mg/dL — ABNORMAL HIGH (ref 0.0–149.0)
VLDL: 31 mg/dL (ref 0.0–40.0)

## 2014-01-26 LAB — HEMOGLOBIN A1C: Hgb A1c MFr Bld: 8 % — ABNORMAL HIGH (ref 4.6–6.5)

## 2014-01-26 LAB — BASIC METABOLIC PANEL
BUN: 45 mg/dL — ABNORMAL HIGH (ref 6–23)
CHLORIDE: 102 meq/L (ref 96–112)
CO2: 26 mEq/L (ref 19–32)
Calcium: 8.9 mg/dL (ref 8.4–10.5)
Creatinine, Ser: 2 mg/dL — ABNORMAL HIGH (ref 0.4–1.2)
GFR: 31.15 mL/min — ABNORMAL LOW (ref >60.00)
Glucose, Bld: 211 mg/dL — ABNORMAL HIGH (ref 70–99)
POTASSIUM: 3.6 meq/L (ref 3.5–5.1)
Sodium: 138 mEq/L (ref 135–145)

## 2014-01-26 NOTE — Progress Notes (Signed)
Patient ID: [name], female   DOB: 1933-11-23, 80 y.o.   MRN: [id number]    Reason for Appointment: F/u for Type 2 Diabetes  Referring physician: Jordan Hawks Rankins  History of Present Illness:          Diagnosis: Type 2 diabetes mellitus, date of diagnosis: ? 34 years ago        Past history: She was initially treated with Metformin but this was done because it caused GI side effects  May also tried Actos at some point, probably started because of the fear of side effects but did not know if it helped Actos stopped ? Reason Over the last few years she has been taking Precose with meals and also Januvia in increasing doses Apparently her blood sugars have been higher since 9/14 when she had abdominal surgery and before that they were as low as 70. On her initial consultation in 6/15 because of her renal dysfunction her medication regimen was changed. Previously was taking Januvia, Precose and glipizide without adequate control; her blood sugars were occasionally over 200 fasting and A1c was 9.4 She was started on Victoza 0.6 mg and Lantus insulin 8 units a day but the Lantus had to be stopped because of relatively low fasting glucose. She had no side effects from Victoza    Did not have improvement in postprandial glucose with trying Prandin   Recent history:  With glipizide and Victoza her postprandial readings were still quite high, highest 330 and she was started on Humalog at mealtimes A1c was still over 8% in 8/15 She is taking Humalog consistently before eating breakfast and supper as directed without any difficulty Her suppertime dose was increased to 5 units but she still tends to have relatively high readings at night Since she was having relatively low blood sugars in the mornings waking up the glipizide was stopped Also despite instructions is not doing any readings after breakfast or lunch A1c pending She thinks she is eating 3 balanced meals a day       Oral  hypoglycemic drugs the patient is taking are: none  Side effects from medications have been: None INSULIN: Humalog 3 U acb, 5 acs  Glucose monitoring:  done once or twice a day        Glucometer:  One Touch Verio  PREMEAL Breakfast Lunch Dinner Bedtime Overall  Glucose range: 118-159   156-260   median: 134   200 148    Hypoglycemia: none    Glycemic control:  A1c on 08/02/13 was 9.4, previous levels from PCP not available  Lab Results  Component Value Date   HGBA1C 8.2* 10/27/2013   HGBA1C 8.1* 12/09/2012   Lab Results  Component Value Date   CREATININE 2.5* 10/27/2013   Urine microalbumin report not available  Self-care: The diet that the patient has been following is: None.      Meals: 3 meals per day. Breakfast: eggs, grits or pancakes; rarely will have high fat meals like chicken wings, sometimes will have spaghetti with meat sauce at supper           Exercise:  none, unable to         Dietician visit: Most recent: Unknown.               Retinal exam: Most recent: Within the last year  Weight history: Wt Readings from Last 3 Encounters:  10/25/13 199 lb 9.6 oz (90.538 kg)  09/06/13 204 lb 9.6 oz (92.806 kg)  08/25/13 205 lb (92.987 kg)      Medication List       This list is accurate as of: 01/26/14  4:22 PM.  Always use your most recent med list.               amiodarone 200 MG tablet  Commonly known as:  PACERONE  TAKE 1 TABLET (200 MG TOTAL) BY MOUTH DAILY.     amLODipine 10 MG tablet  Commonly known as:  NORVASC     atorvastatin 40 MG tablet  Commonly known as:  LIPITOR  Take 40 mg by mouth daily.     bisacodyl 10 MG suppository  Commonly known as:  DULCOLAX  Place 1 suppository (10 mg total) rectally daily as needed.     CALCIUM 1200 PO  Take 1,200 mg by mouth daily.     CENTRUM SILVER ADULT 50+ Tabs  Take by mouth.     Cranberry 250 MG Caps  Take 250 mg by mouth daily.     diltiazem 360 MG 24 hr capsule  Commonly known as:   CARDIZEM CD  Take 1 capsule (360 mg total) by mouth daily.     econazole nitrate 1 % cream  Apply 1 application topically daily as needed (to groin rash as needed).     esomeprazole 20 MG capsule  Commonly known as:  NEXIUM  Take 40 mg by mouth daily before breakfast.     ferrous sulfate 325 (65 FE) MG tablet  Take 325 mg by mouth daily with breakfast.     fluticasone 50 MCG/ACT nasal spray  Commonly known as:  FLONASE  Place 2 sprays into the nose daily as needed for allergies.     furosemide 80 MG tablet  Commonly known as:  LASIX  Take 80 mg by mouth daily. 1 am     glipiZIDE 2.5 MG 24 hr tablet  Commonly known as:  GLUCOTROL XL  Take 1 tablet (2.5 mg total) by mouth daily with breakfast.     glucose blood test strip  Commonly known as:  ONETOUCH VERIO  Use as instructed to check blood sugar 2 times per day dx code E11.65     insulin lispro 100 UNIT/ML KiwkPen  Commonly known as:  HUMALOG  Inject 5 units as directed with meals     Insulin Pen Needle 31G X 5 MM Misc  Use 3 pen needles per day     KLOR-CON M10 10 MEQ tablet  Generic drug:  potassium chloride  TAKE 3 TABS A.M, AND 3 TABS IN THE P.M.     metolazone 5 MG tablet  Commonly known as:  ZAROXOLYN  Take 5 mg by mouth every Monday, Wednesday, and Friday.     metoprolol 50 MG tablet  Commonly known as:  LOPRESSOR  Take 50 mg by mouth 2 (two) times daily.     multivitamin with minerals Tabs tablet  Take 1 tablet by mouth daily.     ONETOUCH DELICA LANCETS FINE Misc  Use to check blood sugar 2 times per day dx code 250.02     pantoprazole 20 MG tablet  Commonly known as:  PROTONIX  Take 20 mg by mouth daily.     pantoprazole 40 MG tablet  Commonly known as:  PROTONIX     repaglinide 1 MG tablet  Commonly known as:  PRANDIN  TAKE 1 TABLET (1 MG TOTAL) BY MOUTH 3 (THREE) TIMES DAILY BEFORE MEALS.     VICTOZA 18 MG/3ML  Sopn  Generic drug:  Liraglutide  INJECT 1.2MG  SUBCUTANEOUSLY AT THE SAME TIME      warfarin 5 MG tablet  Commonly known as:  COUMADIN  TAKE AS DIRECTED BY COUMADIN CLINIC        Allergies:  Allergies  Allergen Reactions  . Lotensin [Benazepril Hcl] Anaphylaxis  . Zantac [Ranitidine Hcl] Anaphylaxis    Past Medical History  Diagnosis Date  . Hypertension   . Diabetes mellitus without complication   . Irregular heart beat   . Renal disorder   . Ileus, postoperative 12/17/2012    Past Surgical History  Procedure Laterality Date  . Hernia repair    . Right lumpectomy    . Cataract extraction, bilateral    . Ventral hernia repair N/A 12/12/2012    Procedure: LAPAROSCOPIC VENTRAL HERNIA;  Surgeon: Madilyn Hook, DO;  Location: [facility];  Service: General;  Laterality: N/A;  . Insertion of mesh N/A 12/12/2012    Procedure: INSERTION OF MESH;  Surgeon: Madilyn Hook, DO;  Location: [facility];  Service: General;  Laterality: N/A;  . Laparoscopic lysis of adhesions N/A 12/12/2012    Procedure: LAPAROSCOPIC LYSIS OF ADHESIONS;  Surgeon: Madilyn Hook, DO;  Location: [facility];  Service: General;  Laterality: N/A;    Family History  Problem Relation Age of Onset  . Diabetes Mother   . Diabetes Sister     Social History:  reports that she has quit smoking. Her smoking use included Cigarettes. She smoked 0.00 packs per day. She has never used smokeless tobacco. She reports that she does not drink alcohol or use illicit drugs.    Review of Systems       Lipids: She has been on high dose Lipitor for this, last LDL was 34 from PCP       No results found for: CHOL     The blood pressure has been treated with multiple medications      She has had history of swelling of feet and is on Lasix; currently not taking metolazone; managed by PCP and nephrologist.  Because of increasing creatinine her evening Lasix was stopped on her last visit She has not had any increased swelling especially on the right side She has swelling of her left leg towards the afternoons and  evenings She cannot wear her elastic stocking because of tenderness of the left leg but does not complain of calf pain      Chronic kidney disease:       She has weakness in her legs since last year, needs a walker and cannot walk very long    LABS:  No visits with results within 1 Week(s) from this visit. Latest known visit with results is:  Abstract on 01/19/2014  Component Date Value Ref Range Status  . HM Diabetic Eye Exam 01/19/2014 No Retinopathy  No Retinopathy Final    Physical Examination:  BP 125/63 mmHg  Pulse 67  Temp(Src) 98 F (36.7 C)  Resp 14  Ht   Wt   SpO2 96%  Edema left lower leg 2+ and none on right She has redness and mild warmth of the left lower leg with stasis changes Calf tenderness not present  ASSESSMENT:  Diabetes type 2, uncontrolled    She has had a very long history of diabetes which had shown progression and A1c of over 9% on oral hypoglycemic drugs With low dose mealtime insulin her blood sugars are overall better although still averaging 200 at bedtime She has been  afraid to increase her Humalog on her own and is still taking only small doses of 5 units at supper Fasting readings are minimally increased, previously getting low with taking glipizide ER She is usually watching her diet and is able to comply with her insulin regimen as discussed in history of present illness Last A1c was in 8/15  CKD: Followed by nephrologist, Labs pending from today  HYPERLIPIDEMIA: on high-dose Lipitor, needs follow-up  PLAN:   She will be continued on Victoza  1.2 mg daily Increase suppertime dose of Humalog by at least one unit Discussed timing of glucose monitoring and to check more readings about 2 hours after any of her meals She may need insulin at lunch time also based on postprandial readings Also discussed that she may need 1-2 units more at breakfast if postprandial readings are at least over 200 consistently Needs balanced meals with  enough protein She will stop checking her sugars in the morning daily  She will call if blood sugars are consistently high or low  Check A1c and lipids  Patient Instructions  Please check blood sugars at least half the time about 2 hours after any meal and 2 times per week on waking up.  Please bring blood sugar monitor to each visit  Humalog 6 units at supper and 7 units for larger meal   Counseling time over 50% of today's 25 minute visit  Jarquavious Fentress 01/26/2014, 4:22 PM   Note: This office note was prepared with Estate agent. Any transcriptional errors that result from this process are unintentional.  Addendum: A1c still relatively high at 8%, Lipids okay  Office Visit on 01/26/2014  Component Date Value Ref Range Status  . Hgb A1c MFr Bld 01/26/2014 8.0* 4.6 - 6.5 % Final   Glycemic Control Guidelines for People with Diabetes:Non Diabetic:  <6%Goal of Therapy: <7%Additional Action Suggested:  >8%   . Sodium 01/26/2014 138  135 - 145 mEq/L Final  . Potassium 01/26/2014 3.6  3.5 - 5.1 mEq/L Final  . Chloride 01/26/2014 102  96 - 112 mEq/L Final  . CO2 01/26/2014 26  19 - 32 mEq/L Final  . Glucose, Bld 01/26/2014 211* 70 - 99 mg/dL Final  . BUN 01/26/2014 45* 6 - 23 mg/dL Final  . Creatinine, Ser 01/26/2014 2.0* 0.4 - 1.2 mg/dL Final  . Calcium 01/26/2014 8.9  8.4 - 10.5 mg/dL Final  . GFR 01/26/2014 31.15* >60.00 mL/min Final  . Cholesterol 01/26/2014 118  0 - 200 mg/dL Final   ATP III Classification       Desirable:  < 200 mg/dL               Borderline High:  200 - 239 mg/dL          High:  > = 240 mg/dL  . Triglycerides 01/26/2014 155.0* 0.0 - 149.0 mg/dL Final   Normal:  <150 mg/dLBorderline High:  150 - 199 mg/dL  . HDL 01/26/2014 46.70  >39.00 mg/dL Final  . VLDL 01/26/2014 31.0  0.0 - 40.0 mg/dL Final  . LDL Cholesterol 01/26/2014 40  0 - 99 mg/dL Final  . Total CHOL/HDL Ratio 01/26/2014 3   Final                  Men          Women1/2  Average Risk     3.4          3.3Average Risk  5.0          4.42X Average Risk          9.6          7.13X Average Risk          15.0          11.0                      . NonHDL 01/26/2014 71.30   Final   NOTE:  Non-HDL goal should be 30 mg/dL higher than patient's LDL goal (i.e. LDL goal of < 70 mg/dL, would have non-HDL goal of < 100 mg/dL)

## 2014-01-26 NOTE — Patient Instructions (Signed)
Please check blood sugars at least half the time about 2 hours after any meal and 2 times per week on waking up.  Please bring blood sugar monitor to each visit  Humalog 6 units at supper and 7 units for larger meal

## 2014-02-21 ENCOUNTER — Ambulatory Visit: Payer: Medicare Other | Admitting: Interventional Cardiology

## 2014-02-21 ENCOUNTER — Ambulatory Visit (INDEPENDENT_AMBULATORY_CARE_PROVIDER_SITE_OTHER): Payer: Medicare Other

## 2014-02-21 DIAGNOSIS — I4891 Unspecified atrial fibrillation: Secondary | ICD-10-CM

## 2014-02-21 DIAGNOSIS — Z5181 Encounter for therapeutic drug level monitoring: Secondary | ICD-10-CM

## 2014-02-21 LAB — POCT INR: INR: 2.7

## 2014-03-25 ENCOUNTER — Other Ambulatory Visit: Payer: Self-pay | Admitting: Interventional Cardiology

## 2014-03-28 ENCOUNTER — Encounter: Payer: Self-pay | Admitting: Interventional Cardiology

## 2014-03-28 ENCOUNTER — Ambulatory Visit (INDEPENDENT_AMBULATORY_CARE_PROVIDER_SITE_OTHER): Payer: Medicare Other | Admitting: Interventional Cardiology

## 2014-03-28 VITALS — BP 158/76 | HR 67 | Ht 66.0 in | Wt 212.0 lb

## 2014-03-28 DIAGNOSIS — Z79899 Other long term (current) drug therapy: Secondary | ICD-10-CM

## 2014-03-28 DIAGNOSIS — N184 Chronic kidney disease, stage 4 (severe): Secondary | ICD-10-CM

## 2014-03-28 DIAGNOSIS — I4891 Unspecified atrial fibrillation: Secondary | ICD-10-CM

## 2014-03-28 DIAGNOSIS — Z7901 Long term (current) use of anticoagulants: Secondary | ICD-10-CM

## 2014-03-28 DIAGNOSIS — I10 Essential (primary) hypertension: Secondary | ICD-10-CM

## 2014-03-28 LAB — HEPATIC FUNCTION PANEL
ALT: 74 U/L — ABNORMAL HIGH (ref 0–35)
AST: 99 U/L — ABNORMAL HIGH (ref 0–37)
Albumin: 3.5 g/dL (ref 3.5–5.2)
Alkaline Phosphatase: 68 U/L (ref 39–117)
BILIRUBIN DIRECT: 0.1 mg/dL (ref 0.0–0.3)
BILIRUBIN TOTAL: 0.9 mg/dL (ref 0.2–1.2)
Total Protein: 7.5 g/dL (ref 6.0–8.3)

## 2014-03-28 LAB — TSH: TSH: 1.9 u[IU]/mL (ref 0.35–4.50)

## 2014-03-28 NOTE — Patient Instructions (Signed)
Your physician recommends that you continue on your current medications as directed. Please refer to the Current Medication list given to you today.  Your physician recommends that you return for lab work: today (TSH and Hepatic Panel)  Your physician recommends that you schedule a follow-up appointment in: 7-10 days with the [facility].   Your physician wants you to follow-up in: 6 months with Dr. Tamala Julian. You will receive a reminder letter in the mail two months in advance. If you don't receive a letter, please call our office to schedule the follow-up appointment.

## 2014-03-28 NOTE — Progress Notes (Signed)
Patient ID: [name], female   DOB: 07/23/1933, 81 y.o.   MRN: [id number]    [address], [address] [place], [place]  [address] Phone: [phone] Fax:  [phone]  Date:  03/28/2014   ID:  [name], DOB 16-May-1933, MRN [id number]  PCP:  Milagros Evener, MD   ASSESSMENT:  1. Paroxysmal atrial fibrillation, with rhythm control on amiodarone 2. Amiodarone therapy without obvious toxicity 3. Essential hypertension with borderline control 4. Stage IV chronic kidney disease 5. Chronic anticoagulation without complications  PLAN:  1. Continue to follow in the anticoagulation clinic 2. Hepatic panel and TSH today and in 6 months 3. Clinical follow-up in 6 months   SUBJECTIVE: [name] is a 81 y.o. female who feels well. She denies orthopnea, PND, and edema. There is been no blood in the urine or stool. No angina or significant palpitations have occurred. Her last Coumadin clinic visit was D similar first.   Wt Readings from Last 3 Encounters:  03/28/14 212 lb (96.163 kg)  10/25/13 199 lb 9.6 oz (90.538 kg)  09/06/13 204 lb 9.6 oz (92.806 kg)     Past Medical History  Diagnosis Date  . Hypertension   . Diabetes mellitus without complication   . Irregular heart beat   . Renal disorder   . Ileus, postoperative 12/17/2012    Current Outpatient Prescriptions  Medication Sig Dispense Refill  . amiodarone (PACERONE) 200 MG tablet TAKE 1 TABLET (200 MG TOTAL) BY MOUTH DAILY. 30 tablet 0  . amLODipine (NORVASC) 10 MG tablet Take 10 mg by mouth daily.     Marland Kitchen atorvastatin (LIPITOR) 40 MG tablet Take 40 mg by mouth daily.    . Cranberry 250 MG CAPS Take 250 mg by mouth daily.    Marland Kitchen econazole nitrate 1 % cream Apply 1 application topically daily as needed (to groin rash as needed).    Marland Kitchen esomeprazole (NEXIUM) 20 MG capsule Take 40 mg by mouth daily before breakfast.    . ferrous sulfate 325 (65 FE) MG tablet Take 325 mg by mouth daily with breakfast.    .  fluticasone (FLONASE) 50 MCG/ACT nasal spray Place 2 sprays into the nose daily as needed for allergies.    . furosemide (LASIX) 80 MG tablet Take 80 mg by mouth daily. 1 am    . glucose blood (ONETOUCH VERIO) test strip Use as instructed to check blood sugar 2 times per day dx code E11.65 100 each 5  . insulin lispro (HUMALOG) 100 UNIT/ML KiwkPen Inject 5 units as directed with meals (Patient taking differently: Inject 3 units in am and 5 units in pm) 15 mL 3  . Insulin Pen Needle 31G X 5 MM MISC Use 3 pen needles per day 100 each 3  . KLOR-CON M10 10 MEQ tablet TAKE 3 TABS A.M, AND 3 TABS IN THE P.M. 180 tablet 3  . metoprolol (LOPRESSOR) 50 MG tablet Take 50 mg by mouth 2 (two) times daily.    . Multiple Vitamins-Minerals (CENTRUM SILVER ADULT 50+) TABS Take by mouth.    Glory Rosebush DELICA LANCETS FINE MISC Use to check blood sugar 2 times per day dx code 250.02 100 each 1  . pantoprazole (PROTONIX) 20 MG tablet Take 20 mg by mouth daily.    Marland Kitchen VICTOZA 18 MG/3ML SOPN INJECT 1.2MG  SUBCUTANEOUSLY AT THE SAME TIME 6 pen 3  . warfarin (COUMADIN) 5 MG tablet TAKE AS DIRECTED BY COUMADIN CLINIC 30 tablet 3  No current facility-administered medications for this visit.    Allergies:    Allergies  Allergen Reactions  . Lotensin [Benazepril Hcl] Anaphylaxis  . Zantac [Ranitidine Hcl] Anaphylaxis    Social History:  The patient  reports that she has quit smoking. Her smoking use included Cigarettes. She smoked 0.00 packs per day. She has never used smokeless tobacco. She reports that she does not drink alcohol or use illicit drugs.   ROS:  Please see the history of present illness.   Denies edema. No transient neurological symptoms. Has not had syncope.   All other systems reviewed and negative.   OBJECTIVE: VS:  BP 158/76 mmHg  Pulse 67  Ht 5\' 6"  (1.676 m)  Wt 212 lb (96.163 kg)  BMI 34.23 kg/m2 repeat blood pressure 140/70 Well nourished, well developed, in no acute distress, pale  appearing HEENT: normal. Plethoric conjunctiva Neck: JVD flat. Carotid bruit absent  Cardiac:  normal S1, S2; RRR; no murmur Lungs:  clear to auscultation bilaterally, no wheezing, rhonchi or rales Abd: soft, nontender, no hepatomegaly Ext: Edema absent. Pulses 2+ Skin: warm and dry Neuro:  CNs 2-12 intact, no focal abnormalities noted  EKG:  Sinus rhythm with poor hallway for progression and nonspecific T wave flattening       Signed, Illene Labrador III, MD 03/28/2014 10:55 AM

## 2014-03-31 ENCOUNTER — Other Ambulatory Visit: Payer: Self-pay | Admitting: *Deleted

## 2014-03-31 DIAGNOSIS — R945 Abnormal results of liver function studies: Principal | ICD-10-CM

## 2014-03-31 DIAGNOSIS — R7989 Other specified abnormal findings of blood chemistry: Secondary | ICD-10-CM

## 2014-04-06 ENCOUNTER — Ambulatory Visit (INDEPENDENT_AMBULATORY_CARE_PROVIDER_SITE_OTHER): Payer: Medicare Other | Admitting: *Deleted

## 2014-04-06 DIAGNOSIS — Z5181 Encounter for therapeutic drug level monitoring: Secondary | ICD-10-CM

## 2014-04-06 DIAGNOSIS — I4891 Unspecified atrial fibrillation: Secondary | ICD-10-CM

## 2014-04-06 LAB — POCT INR: INR: 1.7

## 2014-04-10 ENCOUNTER — Other Ambulatory Visit: Payer: Self-pay | Admitting: Endocrinology

## 2014-04-19 ENCOUNTER — Other Ambulatory Visit: Payer: Self-pay | Admitting: Endocrinology

## 2014-04-20 ENCOUNTER — Ambulatory Visit (INDEPENDENT_AMBULATORY_CARE_PROVIDER_SITE_OTHER): Payer: Medicare Other | Admitting: *Deleted

## 2014-04-20 DIAGNOSIS — I4891 Unspecified atrial fibrillation: Secondary | ICD-10-CM

## 2014-04-20 DIAGNOSIS — Z5181 Encounter for therapeutic drug level monitoring: Secondary | ICD-10-CM

## 2014-04-20 LAB — POCT INR: INR: 1.8

## 2014-04-28 ENCOUNTER — Encounter: Payer: Self-pay | Admitting: Endocrinology

## 2014-04-28 ENCOUNTER — Ambulatory Visit (INDEPENDENT_AMBULATORY_CARE_PROVIDER_SITE_OTHER): Payer: Medicare Other | Admitting: Endocrinology

## 2014-04-28 VITALS — BP 152/66 | HR 63 | Temp 98.0°F | Resp 14 | Ht 66.0 in | Wt 211.6 lb

## 2014-04-28 DIAGNOSIS — E1165 Type 2 diabetes mellitus with hyperglycemia: Secondary | ICD-10-CM

## 2014-04-28 DIAGNOSIS — IMO0002 Reserved for concepts with insufficient information to code with codable children: Secondary | ICD-10-CM

## 2014-04-28 DIAGNOSIS — N184 Chronic kidney disease, stage 4 (severe): Secondary | ICD-10-CM

## 2014-04-28 LAB — COMPREHENSIVE METABOLIC PANEL
ALK PHOS: 66 U/L (ref 39–117)
ALT: 56 U/L — ABNORMAL HIGH (ref 0–35)
AST: 69 U/L — AB (ref 0–37)
Albumin: 3.5 g/dL (ref 3.5–5.2)
BILIRUBIN TOTAL: 0.5 mg/dL (ref 0.2–1.2)
BUN: 41 mg/dL — ABNORMAL HIGH (ref 6–23)
CO2: 30 mEq/L (ref 19–32)
CREATININE: 1.93 mg/dL — AB (ref 0.40–1.20)
Calcium: 9.1 mg/dL (ref 8.4–10.5)
Chloride: 101 mEq/L (ref 96–112)
GFR: 32.25 mL/min — ABNORMAL LOW (ref >60.00)
Glucose, Bld: 184 mg/dL — ABNORMAL HIGH (ref 70–99)
Potassium: 4.1 mEq/L (ref 3.5–5.1)
Sodium: 138 mEq/L (ref 135–145)
Total Protein: 7.2 g/dL (ref 6.0–8.3)

## 2014-04-28 LAB — HEMOGLOBIN A1C: Hgb A1c MFr Bld: 7.7 % — ABNORMAL HIGH (ref 4.6–6.5)

## 2014-04-28 NOTE — Progress Notes (Signed)
Patient ID: [name], female   DOB: 09-17-1933, 81 y.o.   MRN: [id number]    Reason for Appointment: F/u for Type 2 Diabetes  Referring physician: Jordan Hawks Rankins  History of Present Illness:          Diagnosis: Type 2 diabetes mellitus, date of diagnosis: ? 72 years ago        Past history: She was initially treated with Metformin but this was done because it caused GI side effects  May also tried Actos at some point, probably started because of the fear of side effects but did not know if it helped Actos stopped ? Reason Over the last few years she has been taking Precose with meals and also Januvia in increasing doses Apparently her blood sugars have been higher since 9/14 when she had abdominal surgery and before that they were as low as 70. On her initial consultation in 6/15 because of her renal dysfunction her medication regimen was changed. Previously was taking Januvia, Precose and glipizide without adequate control; her blood sugars were occasionally over 200 fasting and A1c was 9.4 She was started on Victoza 0.6 mg and Lantus insulin 8 units a day but the Lantus had to be stopped because of relatively low fasting glucose.   Recent history:   INSULIN regimen: Humalog 3 U acb, 6 acs  Since she had tried Prandin and glipizide and her postprandial readings were as high as 330 she was started on Humalog at mealtimes She has been taking small doses and at breakfast and supper only Despite instructions she usually does not check her sugar after breakfast mostly after supper at bedtime On her last visit in 11/15  she was increased from 5 up to 6 units of Humalog and with this her readings at bedtime are averaging about 150, previously 200. Fasting blood sugars are excellent although she has only a few readings Also no hypoglycemia with current regimen She has a relatively large breakfast, sometimes high in fat also and blood sugar is over 200 today in the office after  breakfast Last A1c was 8% She thinks she is eating 3 balanced meals a day       Oral hypoglycemic drugs the patient is taking are: none  Side effects from medications have been: None  Glucose monitoring:  done once or twice a day        Glucometer:  One Touch Verio  PRE-MEAL Breakfast Lunch Dinner Bedtime Overall  Glucose range:  91-141     112-258    Mean/median:  115     155   150    Self-care: The diet that the patient has been following is: None.      Meals: 3 meals per day. Breakfast: eggs, grits; rarely will have high fat meals like fried chicken, sometimes will have spaghetti or rice at supper           Exercise:  none, unable to         Dietician visit: Most recent: Unknown.               Retinal exam: Most recent: Within the last year  Weight history:  Wt Readings from Last 3 Encounters:  04/28/14 211 lb 9.6 oz (95.981 kg)  03/28/14 212 lb (96.163 kg)  10/25/13 199 lb 9.6 oz (90.538 kg)   Glycemic control:  Lab Results  Component Value Date   HGBA1C 7.7* 04/28/2014   HGBA1C 8.0* 01/26/2014   HGBA1C 8.2* 10/27/2013  Lab Results  Component Value Date   LDLCALC 40 01/26/2014   CREATININE 1.93* 04/28/2014       Medication List       This list is accurate as of: 04/28/14  3:21 PM.  Always use your most recent med list.               amiodarone 200 MG tablet  Commonly known as:  PACERONE     amLODipine 10 MG tablet  Commonly known as:  NORVASC  Take 10 mg by mouth daily.     atorvastatin 40 MG tablet  Commonly known as:  LIPITOR  Take 40 mg by mouth daily.     CENTRUM SILVER ADULT 50+ Tabs  Take by mouth.     Cranberry 250 MG Caps  Take 250 mg by mouth daily.     econazole nitrate 1 % cream  Apply 1 application topically daily as needed (to groin rash as needed).     esomeprazole 20 MG capsule  Commonly known as:  NEXIUM  Take 40 mg by mouth daily before breakfast.     ferrous sulfate 325 (65 FE) MG tablet  Take 325 mg by mouth daily with  breakfast.     fluticasone 50 MCG/ACT nasal spray  Commonly known as:  FLONASE  Place 2 sprays into the nose daily as needed for allergies.     furosemide 80 MG tablet  Commonly known as:  LASIX  Take 80 mg by mouth daily. 1 am     glucose blood test strip  Commonly known as:  ONETOUCH VERIO  Use as instructed to check blood sugar 2 times per day dx code E11.65     insulin lispro 100 UNIT/ML KiwkPen  Commonly known as:  HUMALOG  Inject 5 units as directed with meals     Insulin Pen Needle 31G X 5 MM Misc  Use 3 pen needles per day     KLOR-CON M10 10 MEQ tablet  Generic drug:  potassium chloride  TAKE 3 TABLETS IN THE MORNING AND 3 TABLET IN THE EVENING     metoprolol 50 MG tablet  Commonly known as:  LOPRESSOR  Take 50 mg by mouth 2 (two) times daily.     ONETOUCH DELICA LANCETS FINE Misc  Use to check blood sugar 2 times per day dx code 250.02     pantoprazole 20 MG tablet  Commonly known as:  PROTONIX  Take 20 mg by mouth daily.     pantoprazole 40 MG tablet  Commonly known as:  PROTONIX  Take 40 mg by mouth daily.     VICTOZA 18 MG/3ML Sopn  Generic drug:  Liraglutide  INJECT 1.2MG  SUBCUTANEOUSLY DALY     warfarin 5 MG tablet  Commonly known as:  COUMADIN  TAKE AS DIRECTED BY COUMADIN CLINIC        Allergies:  Allergies  Allergen Reactions  . Lotensin [Benazepril Hcl] Anaphylaxis  . Zantac [Ranitidine Hcl] Anaphylaxis    Past Medical History  Diagnosis Date  . Hypertension   . Diabetes mellitus without complication   . Irregular heart beat   . Renal disorder   . Ileus, postoperative 12/17/2012    Past Surgical History  Procedure Laterality Date  . Hernia repair    . Right lumpectomy    . Cataract extraction, bilateral    . Ventral hernia repair N/A 12/12/2012    Procedure: LAPAROSCOPIC VENTRAL HERNIA;  Surgeon: Madilyn Hook, DO;  Location: [facility];  Service:  General;  Laterality: N/A;  . Insertion of mesh N/A 12/12/2012    Procedure:  INSERTION OF MESH;  Surgeon: Madilyn Hook, DO;  Location: [facility];  Service: General;  Laterality: N/A;  . Laparoscopic lysis of adhesions N/A 12/12/2012    Procedure: LAPAROSCOPIC LYSIS OF ADHESIONS;  Surgeon: Madilyn Hook, DO;  Location: [facility];  Service: General;  Laterality: N/A;    Family History  Problem Relation Age of Onset  . Diabetes Mother   . Diabetes Sister     Social History:  reports that she has quit smoking. Her smoking use included Cigarettes. She has never used smokeless tobacco. She reports that she does not drink alcohol or use illicit drugs.    Review of Systems       Lipids: She has been on high dose Lipitor for this        Lab Results  Component Value Date   CHOL 118 01/26/2014   HDL 46.70 01/26/2014   LDLCALC 40 01/26/2014   LDLDIRECT 37.8 10/27/2013   TRIG 155.0* 01/26/2014   CHOLHDL 3 01/26/2014       The blood pressure has been treated with multiple medications, relatively high today      She has had history of swelling of feet and is on Lasix; currently not taking metolazone; managed by PCP and nephrologist.      Chronic kidney disease:    Lab Results  Component Value Date   CREATININE 1.93* 04/28/2014        She has weakness in her legs since 2014; needs a walker and cannot walk very long  Recently taken off amiodarone because of abnormal liver functions.  Has not had any thyroid issues   Lab Results  Component Value Date   TSH 1.90 03/28/2014     Physical Examination:  BP 152/66 mmHg  Pulse 63  Temp(Src) 98 F (36.7 C)  Resp 14  Ht 5\' 6"  (1.676 m)  Wt 211 lb 9.6 oz (95.981 kg)  BMI 34.17 kg/m2  SpO2 96%  Edema left lower leg 1+ and none on right    ASSESSMENT:  Diabetes type 2, uncontrolled    She has had a very long history of diabetes which had shown progression and A1c of over 9% on oral hypoglycemic drugs With low dose mealtime insulin twice a day her blood sugars are overall better although inconsistent at night  and sometimes over 200 based on her meal intake Blood sugar is still relatively high after breakfast today in the office, has not checked these readings at home Fasting blood sugars are excellent with her regimen of Victoza which she is tolerating well  CKD: Followed by nephrologist   Hypertension: Blood pressure is relatively higher today, followed elsewhere  Has abnormal liver functions possibly from amiodarone  PLAN:   Increase breakfast Humalog coverage and continue same doses and evening Discussed trying to reduce fat intake overall especially at breakfast More blood sugars after breakfast, to call if not controlled.  She can alternate after breakfast and after dinner readings No change in Victoza Discussed adjusting the supper time dose based on her carbohydrate intake   Check A1c and liver functions  Follow-up with nephrologist or PCP for hypertension  Patient Instructions  5 units at breakfast; 5-6 at supper based on amount of starch  Please check blood sugars at least half the time about 2 hours after any meal and 1 times per week on waking up. Please bring blood sugar monitor to each visit.  Recommended blood sugar levels about 2 hours after meal is 140-180 and on waking up 90-130   Counseling time over 50% of today's 25 minute visit  Miklos Bidinger 04/28/2014, 3:21 PM   Note: This office note was prepared with Estate agent. Any transcriptional errors that result from this process are unintentional.  Labs as follows:  Office Visit on 04/28/2014  Component Date Value Ref Range Status  . Hgb A1c MFr Bld 04/28/2014 7.7* 4.6 - 6.5 % Final   Glycemic Control Guidelines for People with Diabetes:Non Diabetic:  <6%Goal of Therapy: <7%Additional Action Suggested:  >8%   . Sodium 04/28/2014 138  135 - 145 mEq/L Final  . Potassium 04/28/2014 4.1  3.5 - 5.1 mEq/L Final  . Chloride 04/28/2014 101  96 - 112 mEq/L Final  . CO2 04/28/2014 30  19 - 32  mEq/L Final  . Glucose, Bld 04/28/2014 184* 70 - 99 mg/dL Final  . BUN 04/28/2014 41* 6 - 23 mg/dL Final  . Creatinine, Ser 04/28/2014 1.93* 0.40 - 1.20 mg/dL Final  . Total Bilirubin 04/28/2014 0.5  0.2 - 1.2 mg/dL Final  . Alkaline Phosphatase 04/28/2014 66  39 - 117 U/L Final  . AST 04/28/2014 69* 0 - 37 U/L Final  . ALT 04/28/2014 56* 0 - 35 U/L Final  . Total Protein 04/28/2014 7.2  6.0 - 8.3 g/dL Final  . Albumin 04/28/2014 3.5  3.5 - 5.2 g/dL Final  . Calcium 04/28/2014 9.1  8.4 - 10.5 mg/dL Final  . GFR 04/28/2014 32.25* >60.00 mL/min Final

## 2014-04-28 NOTE — Progress Notes (Signed)
Quick Note:  Please let patient know that the A1c diabetes test is better but liver tests are still high, will forward this to Dr. Tamala Julian ______

## 2014-04-28 NOTE — Patient Instructions (Signed)
5 units at breakfast; 5-6 at supper based on amount of starch  Please check blood sugars at least half the time about 2 hours after any meal and 1 times per week on waking up. Please bring blood sugar monitor to each visit. Recommended blood sugar levels about 2 hours after meal is 140-180 and on waking up 90-130

## 2014-05-01 ENCOUNTER — Telehealth: Payer: Self-pay | Admitting: Interventional Cardiology

## 2014-05-01 NOTE — Telephone Encounter (Signed)
Pt aware that labs have been reviewed by Dr.Smith. Pt given Dr.Smith's recommendation. She needs to see a gastroenterologist about elevated liver enzymes. Amiodarone off med list for 1 month. She currently sees Dr.Magod. Adv her to schedule a f/u appt with him. A copy of labs will be faxed to his office. She verbalized understanding.

## 2014-05-01 NOTE — Telephone Encounter (Signed)
New message      Pt is due for labs work tomorrow.  Dr Dwyane Dee just did labs on Friday.  He will forward it to you.  Please call pt and let her know if she needs to have more labs drawn.  She will not go tomorrow.

## 2014-05-01 NOTE — Telephone Encounter (Signed)
-----   Message from Sinclair Grooms, MD sent at 04/28/2014  5:24 PM EST ----- She needs to see a gastroenterologist about elevated liver enzymes. Amiodarone off med list for 1 month. If she does not have a GI physician, have her seen by Dr. Warnell Bureau.

## 2014-05-01 NOTE — Telephone Encounter (Signed)
attempted to reach pt. line busy will attempt again later

## 2014-05-01 NOTE — Telephone Encounter (Signed)
2nd attempt pt phone rings out

## 2014-05-02 ENCOUNTER — Other Ambulatory Visit: Payer: Medicare Other

## 2014-05-02 ENCOUNTER — Ambulatory Visit (INDEPENDENT_AMBULATORY_CARE_PROVIDER_SITE_OTHER): Payer: Medicare Other | Admitting: *Deleted

## 2014-05-02 DIAGNOSIS — Z5181 Encounter for therapeutic drug level monitoring: Secondary | ICD-10-CM

## 2014-05-02 DIAGNOSIS — I4891 Unspecified atrial fibrillation: Secondary | ICD-10-CM

## 2014-05-02 LAB — POCT INR: INR: 2.2

## 2014-05-09 ENCOUNTER — Other Ambulatory Visit: Payer: Self-pay

## 2014-05-09 DIAGNOSIS — Z1231 Encounter for screening mammogram for malignant neoplasm of breast: Secondary | ICD-10-CM

## 2014-05-23 ENCOUNTER — Ambulatory Visit (INDEPENDENT_AMBULATORY_CARE_PROVIDER_SITE_OTHER): Payer: Medicare Other | Admitting: *Deleted

## 2014-05-23 DIAGNOSIS — Z5181 Encounter for therapeutic drug level monitoring: Secondary | ICD-10-CM

## 2014-05-23 DIAGNOSIS — I4891 Unspecified atrial fibrillation: Secondary | ICD-10-CM

## 2014-05-23 LAB — POCT INR: INR: 2.5

## 2014-05-30 ENCOUNTER — Ambulatory Visit
Admission: RE | Admit: 2014-05-30 | Discharge: 2014-05-30 | Disposition: A | Discharge status: Home / Self Care | Payer: Medicare Other | Source: Ambulatory Visit

## 2014-05-30 DIAGNOSIS — Z1231 Encounter for screening mammogram for malignant neoplasm of breast: Secondary | ICD-10-CM

## 2014-05-31 ENCOUNTER — Other Ambulatory Visit: Payer: Self-pay | Admitting: Family Medicine

## 2014-05-31 DIAGNOSIS — R928 Other abnormal and inconclusive findings on diagnostic imaging of breast: Secondary | ICD-10-CM

## 2014-06-06 ENCOUNTER — Other Ambulatory Visit: Payer: Self-pay | Admitting: Interventional Cardiology

## 2014-06-06 ENCOUNTER — Ambulatory Visit
Admission: RE | Admit: 2014-06-06 | Discharge: 2014-06-06 | Disposition: A | Discharge status: Home / Self Care | Payer: Medicare Other | Source: Ambulatory Visit | Attending: Family Medicine | Admitting: Family Medicine

## 2014-06-06 DIAGNOSIS — R928 Other abnormal and inconclusive findings on diagnostic imaging of breast: Secondary | ICD-10-CM

## 2014-06-20 ENCOUNTER — Ambulatory Visit (INDEPENDENT_AMBULATORY_CARE_PROVIDER_SITE_OTHER): Payer: Medicare Other

## 2014-06-20 DIAGNOSIS — Z5181 Encounter for therapeutic drug level monitoring: Secondary | ICD-10-CM | POA: Diagnosis not present

## 2014-06-20 DIAGNOSIS — I4891 Unspecified atrial fibrillation: Secondary | ICD-10-CM

## 2014-06-20 LAB — POCT INR: INR: 3.4

## 2014-06-23 ENCOUNTER — Other Ambulatory Visit: Payer: Self-pay | Admitting: Endocrinology

## 2014-07-11 ENCOUNTER — Ambulatory Visit (INDEPENDENT_AMBULATORY_CARE_PROVIDER_SITE_OTHER): Payer: Medicare Other | Admitting: *Deleted

## 2014-07-11 DIAGNOSIS — I4891 Unspecified atrial fibrillation: Secondary | ICD-10-CM | POA: Diagnosis not present

## 2014-07-11 DIAGNOSIS — Z5181 Encounter for therapeutic drug level monitoring: Secondary | ICD-10-CM | POA: Diagnosis not present

## 2014-07-11 LAB — POCT INR: INR: 2.2

## 2014-07-27 ENCOUNTER — Encounter: Payer: Self-pay | Admitting: Endocrinology

## 2014-07-27 ENCOUNTER — Ambulatory Visit (INDEPENDENT_AMBULATORY_CARE_PROVIDER_SITE_OTHER): Payer: Medicare Other | Admitting: Endocrinology

## 2014-07-27 VITALS — BP 156/68 | HR 63 | Temp 97.7°F | Resp 16 | Ht 66.0 in | Wt 213.4 lb

## 2014-07-27 DIAGNOSIS — E785 Hyperlipidemia, unspecified: Secondary | ICD-10-CM | POA: Diagnosis not present

## 2014-07-27 DIAGNOSIS — E1165 Type 2 diabetes mellitus with hyperglycemia: Secondary | ICD-10-CM

## 2014-07-27 DIAGNOSIS — IMO0002 Reserved for concepts with insufficient information to code with codable children: Secondary | ICD-10-CM

## 2014-07-27 LAB — POCT URINALYSIS DIPSTICK
Bilirubin, UA: NEGATIVE
Blood, UA: NEGATIVE
Glucose, UA: NEGATIVE
Ketones, UA: NEGATIVE
LEUKOCYTES UA: NEGATIVE
Nitrite, UA: NEGATIVE
Spec Grav, UA: 1.01
UROBILINOGEN UA: 0.2
pH, UA: 6

## 2014-07-27 LAB — COMPREHENSIVE METABOLIC PANEL
ALK PHOS: 73 U/L (ref 39–117)
ALT: 35 U/L (ref 0–35)
AST: 38 U/L — ABNORMAL HIGH (ref 0–37)
Albumin: 3.4 g/dL — ABNORMAL LOW (ref 3.5–5.2)
BUN: 28 mg/dL — ABNORMAL HIGH (ref 6–23)
CO2: 29 mEq/L (ref 19–32)
CREATININE: 1.67 mg/dL — AB (ref 0.40–1.20)
Calcium: 9.2 mg/dL (ref 8.4–10.5)
Chloride: 101 mEq/L (ref 96–112)
GFR: 38.08 mL/min — AB (ref >60.00)
Glucose, Bld: 143 mg/dL — ABNORMAL HIGH (ref 70–99)
POTASSIUM: 3.6 meq/L (ref 3.5–5.1)
Sodium: 136 mEq/L (ref 135–145)
TOTAL PROTEIN: 6.9 g/dL (ref 6.0–8.3)
Total Bilirubin: 0.5 mg/dL (ref 0.2–1.2)

## 2014-07-27 LAB — MICROALBUMIN / CREATININE URINE RATIO
Creatinine,U: 35.4 mg/dL
MICROALB UR: 5.9 mg/dL — AB (ref 0.0–1.9)
MICROALB/CREAT RATIO: 16.7 mg/g (ref 0.0–30.0)

## 2014-07-27 LAB — HEMOGLOBIN A1C: HEMOGLOBIN A1C: 8.2 % — AB (ref 4.6–6.5)

## 2014-07-27 NOTE — Patient Instructions (Signed)
Please check blood sugars at least half the time about 2 hours after any meal and 3 times per week on waking up.  Please bring blood sugar monitor to each visit. Recommended blood sugar levels about 2 hours after meal is 140-180 and on waking up 90-130

## 2014-07-27 NOTE — Progress Notes (Signed)
Patient ID: [name], female   DOB: 1933/10/11, 81 y.o.   MRN: [id number]    Reason for Appointment: F/u for Type 2 Diabetes  Referring physician: Jordan Hawks Rankins  History of Present Illness:          Diagnosis: Type 2 diabetes mellitus, date of diagnosis: ? 65 years ago        Past history: She was initially treated with Metformin but this was done because it caused GI side effects  May also tried Actos at some point, probably started because of the fear of side effects but did not know if it helped Actos stopped ? Reason Over the last few years she has been taking Precose with meals and also Januvia in increasing doses Apparently her blood sugars have been higher since 9/14 when she had abdominal surgery and before that they were as low as 70. On her initial consultation in 6/15 because of her renal dysfunction her medication regimen was changed. Previously was taking Januvia, Precose and glipizide without adequate control; her blood sugars were occasionally over 200 fasting and A1c was 9.4 She was started on Victoza 0.6 mg and Lantus insulin 8 units a day but the Lantus had to be stopped because of relatively low fasting glucose.  Since she had tried Prandin and glipizide and her postprandial readings were as high as 330 she was started on Humalog at mealtimes in 8/15  Recent history:   INSULIN regimen: Humalog 6 U acb, 6 acs  She has been taking small doses of Humalog  at breakfast and supper only The dose was increased to 6 units at breakfast and she continues to take 6 units at supper also Also taking Victoza which appears to be controlling her fasting readings well She did not bring her monitor for download but she thinks her blood sugars are reasonably well-controlled A1c is slightly better at 7.7 as of 2/16 She did have high readings in mid-April when she got prednisone and she thinks she did increase her insulin for this  Has no hypoglycemia with current regimen She  has a relatively large breakfast, sometimes high in fat also  Usually eating a sandwich at lunch but does not check blood sugars after lunch      Oral hypoglycemic drugs the patient is taking are: none  Side effects from medications have been: None  Glucose monitoring:  done once or twice a day        Glucometer:  One Touch Verio  PRE-MEAL Breakfast Lunch Dinner Bedtime Overall  Glucose range: 100-110 150  133-180   Mean/median:         Self-care:   Meals: 3 meals per day. Breakfast: eggs, grits; rarely will have high fat meals like fried chicken, sometimes will have spaghetti or rice at supper           Exercise:  none, unable to         Dietician visit: Most recent: Unknown.               Retinal exam: Most recent: Within the last year  Weight history:  Wt Readings from Last 3 Encounters:  07/27/14 213 lb 6.4 oz (96.798 kg)  04/28/14 211 lb 9.6 oz (95.981 kg)  03/28/14 212 lb (96.163 kg)   Glycemic control:  Lab Results  Component Value Date   HGBA1C 7.7* 04/28/2014   HGBA1C 8.0* 01/26/2014   HGBA1C 8.2* 10/27/2013   Lab Results  Component Value Date   LDLCALC 40  01/26/2014   CREATININE 1.93* 04/28/2014       Medication List       This list is accurate as of: 07/27/14 11:59 AM.  Always use your most recent med list.               amiodarone 200 MG tablet  Commonly known as:  PACERONE     amLODipine 10 MG tablet  Commonly known as:  NORVASC  Take 10 mg by mouth daily.     atorvastatin 40 MG tablet  Commonly known as:  LIPITOR  Take 40 mg by mouth daily.     CENTRUM SILVER ADULT 50+ Tabs  Take by mouth.     Cranberry 250 MG Caps  Take 250 mg by mouth daily.     econazole nitrate 1 % cream  Apply 1 application topically daily as needed (to groin rash as needed).     esomeprazole 20 MG capsule  Commonly known as:  NEXIUM  Take 40 mg by mouth daily before breakfast.     ferrous sulfate 325 (65 FE) MG tablet  Take 325 mg by mouth daily with  breakfast.     fluticasone 50 MCG/ACT nasal spray  Commonly known as:  FLONASE  Place 2 sprays into the nose daily as needed for allergies.     furosemide 80 MG tablet  Commonly known as:  LASIX  Take 80 mg by mouth daily. 1 am     glucose blood test strip  Commonly known as:  ONETOUCH VERIO  Use as instructed to check blood sugar 2 times per day dx code E11.65     insulin lispro 100 UNIT/ML KiwkPen  Commonly known as:  HUMALOG  Inject 5 units as directed with meals     Insulin Pen Needle 31G X 5 MM Misc  Use 3 pen needles per day     KLOR-CON M10 10 MEQ tablet  Generic drug:  potassium chloride  TAKE 3 TABLETS IN THE MORNING AND 3 TABLET IN THE EVENING     metolazone 5 MG tablet  Commonly known as:  ZAROXOLYN     metoprolol 50 MG tablet  Commonly known as:  LOPRESSOR  Take 50 mg by mouth 2 (two) times daily.     ONETOUCH DELICA LANCETS FINE Misc  Use to check blood sugar 2 times per day dx code 250.02     pantoprazole 20 MG tablet  Commonly known as:  PROTONIX  Take 20 mg by mouth daily.     pantoprazole 40 MG tablet  Commonly known as:  PROTONIX  Take 40 mg by mouth daily.     VICTOZA 18 MG/3ML Sopn  Generic drug:  Liraglutide  INJECT 1.2MG  SUBCUTANEOUSLY DALY     warfarin 5 MG tablet  Commonly known as:  COUMADIN  TAKE AS DIRECTED BY COUMADIN CLINIC        Allergies:  Allergies  Allergen Reactions  . Lotensin [Benazepril Hcl] Anaphylaxis  . Zantac [Ranitidine Hcl] Anaphylaxis    Past Medical History  Diagnosis Date  . Hypertension   . Diabetes mellitus without complication   . Irregular heart beat   . Renal disorder   . Ileus, postoperative 12/17/2012    Past Surgical History  Procedure Laterality Date  . Hernia repair    . Right lumpectomy    . Cataract extraction, bilateral    . Ventral hernia repair N/A 12/12/2012    Procedure: LAPAROSCOPIC VENTRAL HERNIA;  Surgeon: Madilyn Hook, DO;  Location: [facility];  Service: General;  Laterality:  N/A;  . Insertion of mesh N/A 12/12/2012    Procedure: INSERTION OF MESH;  Surgeon: Madilyn Hook, DO;  Location: [facility];  Service: General;  Laterality: N/A;  . Laparoscopic lysis of adhesions N/A 12/12/2012    Procedure: LAPAROSCOPIC LYSIS OF ADHESIONS;  Surgeon: Madilyn Hook, DO;  Location: [facility];  Service: General;  Laterality: N/A;    Family History  Problem Relation Age of Onset  . Diabetes Mother   . Diabetes Sister     Social History:  reports that she has quit smoking. Her smoking use included Cigarettes. She has never used smokeless tobacco. She reports that she does not drink alcohol or use illicit drugs.    Review of Systems       Lipids: She has been on high dose Lipitor for hyperlipidemia        Lab Results  Component Value Date   CHOL 118 01/26/2014   HDL 46.70 01/26/2014   LDLCALC 40 01/26/2014   LDLDIRECT 37.8 10/27/2013   TRIG 155.0* 01/26/2014   CHOLHDL 3 01/26/2014       The blood pressure has been treated with multiple medications, relatively high again     She has had history of swelling of feet and is on Lasix; currently not taking metolazone; managed by PCP and nephrologist.      Chronic kidney disease followed by nephrologist:    Lab Results  Component Value Date   CREATININE 1.93* 04/28/2014   Last diabetic foot exam in 6/16      She has weakness in her legs since 2014; needs a walker and cannot walk very long  Physical Examination:  BP 156/68 mmHg  Pulse 63  Temp(Src) 97.7 F (36.5 C)  Resp 16  Ht 5\' 6"  (1.676 m)  Wt 213 lb 6.4 oz (96.798 kg)  BMI 34.46 kg/m2  SpO2 97%  Edema left lower leg 1+ and none on right    ASSESSMENT:  Diabetes type 2, uncontrolled    She has had a very long history of diabetes which had shown progression and A1c of over 9% on oral hypoglycemic drugs With low dose mealtime insulin twice a day her blood sugars are overall better  Unable to review her home readings as she did not bring her  monitor Fasting blood sugars are excellent with her regimen of Victoza 1.2 mg which she is tolerating well  CKD: Followed by nephrologist   Hypertension: Blood pressure is relatively higher again, followed by nephrologist and PCP  Has abnormal liver functions and this was felt to be multifactorial by gastroenterologist   PLAN:   Continue same insulin doses for now Check A1c today  No change in Victoza Discussed adjusting the supper time dose based on her meal size    Check A1c and liver functions  Follow-up with nephrologist or PCP for hypertension  Patient Instructions  Please check blood sugars at least half the time about 2 hours after any meal and 3 times per week on waking up.  Please bring blood sugar monitor to each visit. Recommended blood sugar levels about 2 hours after meal is 140-180 and on waking up 90-130      Rinaldo Macqueen 07/27/2014, 11:59 AM   Note: This office note was prepared with Estate agent. Any transcriptional errors that result from this process are unintentional.  Labs as follows:  Office Visit on 07/27/2014  Component Date Value Ref Range Status  . Color, UA  07/27/2014 Yellow   Final  . Clarity, UA 07/27/2014 Cloudy   Final  . Glucose, UA 07/27/2014 Neg   Final  . Bilirubin, UA 07/27/2014 Neg   Final  . Ketones, UA 07/27/2014 Neg   Final  . Spec Grav, UA 07/27/2014 1.010   Final  . Blood, UA 07/27/2014 Neg   Final  . pH, UA 07/27/2014 6.0   Final  . Protein, UA 07/27/2014 Trace   Final  . Urobilinogen, UA 07/27/2014 0.2   Final  . Nitrite, UA 07/27/2014 Neg   Final  . Leukocytes, UA 07/27/2014 Negative   Final

## 2014-08-05 ENCOUNTER — Other Ambulatory Visit: Payer: Self-pay | Admitting: Endocrinology

## 2014-08-08 ENCOUNTER — Ambulatory Visit (INDEPENDENT_AMBULATORY_CARE_PROVIDER_SITE_OTHER): Payer: Medicare Other | Admitting: Pharmacist Clinician (PhC)/ Clinical Pharmacy Specialist

## 2014-08-08 DIAGNOSIS — Z5181 Encounter for therapeutic drug level monitoring: Secondary | ICD-10-CM

## 2014-08-08 DIAGNOSIS — I4891 Unspecified atrial fibrillation: Secondary | ICD-10-CM | POA: Diagnosis not present

## 2014-08-08 LAB — POCT INR: INR: 2.8

## 2014-09-02 ENCOUNTER — Other Ambulatory Visit: Payer: Self-pay | Admitting: Endocrinology

## 2014-09-05 ENCOUNTER — Other Ambulatory Visit: Payer: Self-pay | Admitting: Endocrinology

## 2014-09-05 ENCOUNTER — Ambulatory Visit (INDEPENDENT_AMBULATORY_CARE_PROVIDER_SITE_OTHER): Payer: Medicare Other | Admitting: *Deleted

## 2014-09-05 DIAGNOSIS — Z5181 Encounter for therapeutic drug level monitoring: Secondary | ICD-10-CM | POA: Diagnosis not present

## 2014-09-05 DIAGNOSIS — I4891 Unspecified atrial fibrillation: Secondary | ICD-10-CM | POA: Diagnosis not present

## 2014-09-05 LAB — POCT INR: INR: 2.9

## 2014-09-28 ENCOUNTER — Telehealth: Payer: Self-pay | Admitting: Interventional Cardiology

## 2014-09-28 MED ORDER — AMIODARONE HCL 200 MG PO TABS: 200.0000 mg | ORAL_TABLET | Freq: Every day | ORAL | Status: DC

## 2014-09-28 NOTE — Telephone Encounter (Signed)
New message      Pt saw Dr Watt Climes two months ago.  He said pt can stay on amiodarone.  Calling to see if Dr Tamala Julian want to presc this medication again?  Please advise

## 2014-09-28 NOTE — Telephone Encounter (Signed)
Pt states that she was taken off of Amiodarone because Dr. Tamala Julian was concerned about her elevated liver enzymes. Pt states that she seen Dr. Watt Climes about 2 months ago and he said it was fine for her to continue taking Amiodarone if Dr. Tamala Julian wanted her to. Pt says "I can feel when my heart skips a beat and it done that one time yesterday for the first time in a long time." Informed pt that I would route this information to Dr. Tamala Julian for review and advisement on medication.

## 2014-09-28 NOTE — Telephone Encounter (Signed)
Resume Amiodarone 200 mg daily. INR check in coumadin clinic in 1 week.

## 2014-09-28 NOTE — Telephone Encounter (Signed)
Informed pt that Dr. Tamala Julian would like for her to resume Amiodarone 200mg  once daily and check INR in [facility] in one week. Scheduled [facility] appt for 10/05/14. Pt states she has plenty of Amiodarone at home and will call when she needs a refill.

## 2014-10-05 ENCOUNTER — Ambulatory Visit (INDEPENDENT_AMBULATORY_CARE_PROVIDER_SITE_OTHER): Payer: Medicare Other | Admitting: *Deleted

## 2014-10-05 DIAGNOSIS — I4891 Unspecified atrial fibrillation: Secondary | ICD-10-CM

## 2014-10-05 DIAGNOSIS — Z5181 Encounter for therapeutic drug level monitoring: Secondary | ICD-10-CM

## 2014-10-05 LAB — POCT INR: INR: 3.7

## 2014-10-10 ENCOUNTER — Other Ambulatory Visit: Payer: Self-pay | Admitting: Interventional Cardiology

## 2014-10-17 ENCOUNTER — Ambulatory Visit (INDEPENDENT_AMBULATORY_CARE_PROVIDER_SITE_OTHER): Payer: Medicare Other

## 2014-10-17 DIAGNOSIS — I4891 Unspecified atrial fibrillation: Secondary | ICD-10-CM | POA: Diagnosis not present

## 2014-10-17 DIAGNOSIS — Z5181 Encounter for therapeutic drug level monitoring: Secondary | ICD-10-CM | POA: Diagnosis not present

## 2014-10-17 LAB — POCT INR: INR: 2.1

## 2014-10-22 ENCOUNTER — Other Ambulatory Visit: Payer: Self-pay | Admitting: Interventional Cardiology

## 2014-10-27 ENCOUNTER — Ambulatory Visit (INDEPENDENT_AMBULATORY_CARE_PROVIDER_SITE_OTHER): Payer: Medicare Other | Admitting: Endocrinology

## 2014-10-27 ENCOUNTER — Encounter: Payer: Self-pay | Admitting: Endocrinology

## 2014-10-27 VITALS — BP 144/80 | HR 71 | Temp 98.0°F | Resp 16 | Ht 66.0 in | Wt 208.0 lb

## 2014-10-27 DIAGNOSIS — E1165 Type 2 diabetes mellitus with hyperglycemia: Secondary | ICD-10-CM

## 2014-10-27 DIAGNOSIS — N189 Chronic kidney disease, unspecified: Secondary | ICD-10-CM

## 2014-10-27 DIAGNOSIS — E785 Hyperlipidemia, unspecified: Secondary | ICD-10-CM

## 2014-10-27 DIAGNOSIS — E1122 Type 2 diabetes mellitus with diabetic chronic kidney disease: Secondary | ICD-10-CM

## 2014-10-27 DIAGNOSIS — Z79899 Other long term (current) drug therapy: Secondary | ICD-10-CM | POA: Diagnosis not present

## 2014-10-27 DIAGNOSIS — IMO0002 Reserved for concepts with insufficient information to code with codable children: Secondary | ICD-10-CM

## 2014-10-27 LAB — BASIC METABOLIC PANEL
BUN: 52 mg/dL — ABNORMAL HIGH (ref 6–23)
CALCIUM: 9.6 mg/dL (ref 8.4–10.5)
CO2: 31 mEq/L (ref 19–32)
CREATININE: 1.88 mg/dL — AB (ref 0.40–1.20)
Chloride: 98 mEq/L (ref 96–112)
GFR: 33.2 mL/min — ABNORMAL LOW (ref >60.00)
GLUCOSE: 184 mg/dL — AB (ref 70–99)
Potassium: 3.4 mEq/L — ABNORMAL LOW (ref 3.5–5.1)
Sodium: 137 mEq/L (ref 135–145)

## 2014-10-27 LAB — POCT GLYCOSYLATED HEMOGLOBIN (HGB A1C): HEMOGLOBIN A1C: 7.9

## 2014-10-27 LAB — LIPID PANEL
CHOL/HDL RATIO: 2
Cholesterol: 114 mg/dL (ref 0–200)
HDL: 51.6 mg/dL (ref >39.00)
LDL Cholesterol: 41 mg/dL (ref 0–99)
NonHDL: 62.84
Triglycerides: 109 mg/dL (ref 0.0–149.0)
VLDL: 21.8 mg/dL (ref 0.0–40.0)

## 2014-10-27 LAB — POCT GLUCOSE (DEVICE FOR HOME USE): Glucose Fasting, POC: 220 mg/dL — AB (ref 70–99)

## 2014-10-27 LAB — HEMOGLOBIN A1C: Hgb A1c MFr Bld: 8 % — ABNORMAL HIGH (ref 4.6–6.5)

## 2014-10-27 NOTE — Patient Instructions (Signed)
Take 7 units before supper, may take 6 if eating light meal  Avoid juice in am, take 5 units in am but if having more fatty foods take 7 units   Check blood sugars on waking up .Marland Kitchen 1-2 .. times a week Also check blood sugars about 2 hours after a meal and do this after different meals by rotation including lunch  Recommended blood sugar levels on waking up is 90-130 and about 2 hours after meal is 140-180 Please bring blood sugar monitor to each visit.

## 2014-10-27 NOTE — Progress Notes (Signed)
Patient ID: [name], female   DOB: [birth year], 81 y.o.   MRN: [id number]    Reason for Appointment: F/u for Type 2 Diabetes  Referring physician: Jordan Hawks Rankins  History of Present Illness:          Diagnosis: Type 2 diabetes mellitus, date of diagnosis: ? 44 years ago        Past history: She was initially treated with Metformin but this was done because it caused GI side effects  May also tried Actos at some point, probably started because of the fear of side effects but did not know if it helped Actos stopped ? Reason Over the last few years she has been taking Precose with meals and also Januvia in increasing doses Apparently her blood sugars have been higher since 9/14 when she had abdominal surgery and before that they were as low as 70. On her initial consultation in 6/15 because of her renal dysfunction her medication regimen was changed. Previously was taking Januvia, Precose and glipizide without adequate control; her blood sugars were occasionally over 200 fasting and A1c was 9.4 She was started on Victoza 0.6 mg and Lantus insulin 8 units a day but the Lantus had to be stopped because of relatively low fasting glucose.  Since she had tried Prandin and glipizide and her postprandial readings were as high as 330 she was started on Humalog at mealtimes in 8/15  Recent history:   INSULIN regimen: Humalog 5 U acb, 6 acs  She has been taking small doses of Humalog  at breakfast and supper only Also taking Victoza which appears to be controlling her fasting readings well She had not brought her monitor for download on the last visit and insulin dose was not changed However she was told to adjust her dosage at least in the morning based on what she is eating  Current blood sugar patterns and problems identified:  Her A1c is still relatively high at 7.9 although slightly better now  Her average blood sugar at night is about 184, done about 3 hours or more after her evening  meal  Glucose is over 200 today in the office after breakfast but she had a relatively higher fat meal with the cheese and a croissant and also had juice.  Usually having juice in the morning at breakfast  She does not check blood sugars after lunch at all despite reminders and she does eat at least one slice of bread at lunch  She has variable readings after supper but they are periodically over 200 also  Fasting blood sugars are again excellent  Has no hypoglycemia with current regimen She has a relatively large breakfast, sometimes high in fat also  Usually eating a sandwich at lunch       Oral hypoglycemic drugs the patient is taking are: none  Side effects from medications have been: None  Glucose monitoring:  done once or twice a day        Glucometer:  One Touch Verio  Mean values apply above for all meters except median for One Touch  PRE-MEAL Fasting  11 AM  Dinner Bedtime Overall  Glucose range: 111-133   114-234   Mean/median:   120   190 182     Self-care:   Meals: 3 meals per day. Bfst  Breakfast: eggs, grits; rarely will have high fat meals like fried chicken, sometimes will have spaghetti or rice at supper  Exercise:  none, unable to         Dietician visit: Most recent: Unknown.               Retinal exam: Most recent: Within the last year  Weight history:  Wt Readings from Last 3 Encounters:  10/27/14 208 lb (94.348 kg)  07/27/14 213 lb 6.4 oz (96.798 kg)  04/28/14 211 lb 9.6 oz (95.981 kg)   Glycemic control:  Lab Results  Component Value Date   HGBA1C 7.9 10/27/2014   HGBA1C 8.2* 07/27/2014   HGBA1C 7.7* 04/28/2014   Lab Results  Component Value Date   MICROALBUR 5.9* 07/27/2014   LDLCALC 40 01/26/2014   CREATININE 1.67* 07/27/2014       Medication List       This list is accurate as of: 10/27/14 10:22 AM.  Always use your most recent med list.               amiodarone 200 MG tablet  Commonly known as:  PACERONE  TAKE 1  TABLET (200 MG TOTAL) BY MOUTH DAILY.     amLODipine 10 MG tablet  Commonly known as:  NORVASC  Take 10 mg by mouth daily.     atorvastatin 40 MG tablet  Commonly known as:  LIPITOR  Take 40 mg by mouth daily.     CENTRUM SILVER ADULT 50+ Tabs  Take by mouth.     Cranberry 250 MG Caps  Take 250 mg by mouth daily.     econazole nitrate 1 % cream  Apply 1 application topically daily as needed (to groin rash as needed).     esomeprazole 20 MG capsule  Commonly known as:  NEXIUM  Take 40 mg by mouth daily before breakfast.     ferrous sulfate 325 (65 FE) MG tablet  Take 325 mg by mouth daily with breakfast.     fluticasone 50 MCG/ACT nasal spray  Commonly known as:  FLONASE  Place 2 sprays into the nose daily as needed for allergies.     furosemide 80 MG tablet  Commonly known as:  LASIX  Take 80 mg by mouth daily. 1 am     glucose blood test strip  Commonly known as:  ONETOUCH VERIO  Use as instructed to check blood sugar 2 times per day dx code E11.65     insulin lispro 100 UNIT/ML KiwkPen  Commonly known as:  HUMALOG  Inject 5 units as directed with meals     Insulin Pen Needle 31G X 5 MM Misc  Use 3 pen needles per day     KLOR-CON M10 10 MEQ tablet  Generic drug:  potassium chloride  TAKE 3 TABLETS IN THE MORNING AND 3 TABLET IN THE EVENING     metolazone 5 MG tablet  Commonly known as:  ZAROXOLYN     metoprolol 50 MG tablet  Commonly known as:  LOPRESSOR  Take 50 mg by mouth 2 (two) times daily.     ONETOUCH DELICA LANCETS FINE Misc  Use to check blood sugar 2 times per day dx code 250.02     pantoprazole 20 MG tablet  Commonly known as:  PROTONIX  Take 20 mg by mouth daily.     pantoprazole 40 MG tablet  Commonly known as:  PROTONIX  Take 40 mg by mouth daily.     VICTOZA 18 MG/3ML Sopn  Generic drug:  Liraglutide  INJECT 1.2MG  SUBCUTANEOUSLY DAILY     warfarin 5 MG tablet  Commonly known as:  COUMADIN  USE AS DIRECTED        Allergies:   Allergies  Allergen Reactions  . Lotensin [Benazepril Hcl] Anaphylaxis  . Zantac [Ranitidine Hcl] Anaphylaxis    Past Medical History  Diagnosis Date  . Hypertension   . Diabetes mellitus without complication   . Irregular heart beat   . Renal disorder   . Ileus, postoperative 12/17/2012    Past Surgical History  Procedure Laterality Date  . Hernia repair    . Right lumpectomy    . Cataract extraction, bilateral    . Ventral hernia repair N/A 12/12/2012    Procedure: LAPAROSCOPIC VENTRAL HERNIA;  Surgeon: Madilyn Hook, DO;  Location: [facility];  Service: General;  Laterality: N/A;  . Insertion of mesh N/A 12/12/2012    Procedure: INSERTION OF MESH;  Surgeon: Madilyn Hook, DO;  Location: [facility];  Service: General;  Laterality: N/A;  . Laparoscopic lysis of adhesions N/A 12/12/2012    Procedure: LAPAROSCOPIC LYSIS OF ADHESIONS;  Surgeon: Madilyn Hook, DO;  Location: [facility];  Service: General;  Laterality: N/A;    Family History  Problem Relation Age of Onset  . Diabetes Mother   . Diabetes Sister     Social History:  reports that she has quit smoking. Her smoking use included Cigarettes. She has never used smokeless tobacco. She reports that she does not drink alcohol or use illicit drugs.    Review of Systems       Lipids: She has been on high dose Lipitor for hyperlipidemia        Lab Results  Component Value Date   CHOL 118 01/26/2014   HDL 46.70 01/26/2014   LDLCALC 40 01/26/2014   LDLDIRECT 37.8 10/27/2013   TRIG 155.0* 01/26/2014   CHOLHDL 3 01/26/2014       The blood pressure has been treated with multiple medications, relatively high again  On Amiodarone:  Lab Results  Component Value Date   TSH 1.90 03/28/2014       She has had history of swelling of feet and is on Lasix; currently not taking metolazone; managed by PCP and nephrologist.      Chronic kidney disease followed by nephrologist:    Lab Results  Component Value Date   CREATININE 1.67*  07/27/2014   Last diabetic foot exam in 6/16     She has weakness in her legs since 2014; needs a walker and cannot walk very long  Physical Examination:  BP 144/80 mmHg  Pulse 71  Temp(Src) 98 F (36.7 C)  Resp 16  Ht 5\' 6"  (1.676 m)  Wt 208 lb (94.348 kg)  BMI 33.59 kg/m2  SpO2 93%     ASSESSMENT:  Diabetes type 2, uncontrolled    She has had a very long history of diabetes  She is doing fairly well considering her age and multiple medical problems with taking low dose of mealtime insulin at breakfast and supper However as discussed above she probably needs 1-2 units more to cover her evening meal and also higher doses when she is eating higher fat meals in the morning She does need to check blood sugars after lunch which she is not doing currently Fasting blood sugars are excellent with her regimen of Victoza 1.2 mg which she is tolerating well  CKD: Followed by nephrologist   Hypertension: Blood pressure is relatively better and she will continue follow-up with nephrologist and PCP   PLAN:   No change in Victoza  Discussed modifying her diet and eliminating juice in the morning Consider consultation with dietitian again; discussed trying to reduce fat intake Insulin doses as above Recheck electrolytes Check TSH on next visit since she is starting amiodarone  Follow-up in 3 months again  Patient Instructions  Take 7 units before supper, may take 6 if eating light meal  Avoid juice in am, take 5 units in am but if having more fatty foods take 7 units   Check blood sugars on waking up .Marland Kitchen 1-2 .. times a week Also check blood sugars about 2 hours after a meal and do this after different meals by rotation including lunch  Recommended blood sugar levels on waking up is 90-130 and about 2 hours after meal is 140-180 Please bring blood sugar monitor to each visit.     Counseling time on subjects discussed above is over 50% of today's 25 minute  visit    Shaleen Talamantez 10/27/2014, 10:22 AM   Note: This office note was prepared with Estate agent. Any transcriptional errors that result from this process are unintentional.

## 2014-10-31 ENCOUNTER — Ambulatory Visit (INDEPENDENT_AMBULATORY_CARE_PROVIDER_SITE_OTHER): Payer: Medicare Other

## 2014-10-31 DIAGNOSIS — Z5181 Encounter for therapeutic drug level monitoring: Secondary | ICD-10-CM | POA: Diagnosis not present

## 2014-10-31 DIAGNOSIS — I4891 Unspecified atrial fibrillation: Secondary | ICD-10-CM

## 2014-10-31 LAB — POCT INR: INR: 1.7

## 2014-11-01 NOTE — Progress Notes (Signed)
Quick Note:  Please let patient know that the potassium is slightly low, also need to forward this to her nephrologist and PCP  ______

## 2014-11-14 ENCOUNTER — Ambulatory Visit (INDEPENDENT_AMBULATORY_CARE_PROVIDER_SITE_OTHER): Payer: Medicare Other

## 2014-11-14 DIAGNOSIS — I4891 Unspecified atrial fibrillation: Secondary | ICD-10-CM

## 2014-11-14 DIAGNOSIS — Z5181 Encounter for therapeutic drug level monitoring: Secondary | ICD-10-CM | POA: Diagnosis not present

## 2014-11-14 LAB — POCT INR: INR: 2.3

## 2014-11-22 NOTE — Progress Notes (Signed)
Cardiology Office Note   Date:  11/23/2014   ID:  [name], DOB [birth year], MRN [id number]  PCP:  Milagros Evener, MD  Cardiologist:  Sinclair Grooms, MD   Chief Complaint  Patient presents with  . Atrial Fibrillation      History of Present Illness: [name] is a 81 y.o. female who presents for PAF, amiodarone therapy, chronic diastolic heart failure, chronic anticoagulation, and essential hypertension.  Doing well from a cardiac standpoint. She denies dyspnea. There has been no chest pain or significant palpitations. She has had no bleeding on Coumadin.she denies chills, fever, orthopnea, PND, palpitations, and syncope. There've been no transient episodes of dizziness or near syncope. There been no episodes of blood in urine or stool. She has not had head trauma.  Past Medical History  Diagnosis Date  . Hypertension   . Diabetes mellitus without complication   . Irregular heart beat   . Renal disorder   . Ileus, postoperative 12/17/2012    Past Surgical History  Procedure Laterality Date  . Hernia repair    . Right lumpectomy    . Cataract extraction, bilateral    . Ventral hernia repair N/A 12/12/2012    Procedure: LAPAROSCOPIC VENTRAL HERNIA;  Surgeon: Madilyn Hook, DO;  Location: [facility];  Service: General;  Laterality: N/A;  . Insertion of mesh N/A 12/12/2012    Procedure: INSERTION OF MESH;  Surgeon: Madilyn Hook, DO;  Location: [facility];  Service: General;  Laterality: N/A;  . Laparoscopic lysis of adhesions N/A 12/12/2012    Procedure: LAPAROSCOPIC LYSIS OF ADHESIONS;  Surgeon: Madilyn Hook, DO;  Location: [facility];  Service: General;  Laterality: N/A;     Current Outpatient Prescriptions  Medication Sig Dispense Refill  . amiodarone (PACERONE) 200 MG tablet TAKE 1 TABLET (200 MG TOTAL) BY MOUTH DAILY. 30 tablet 6  . amLODipine (NORVASC) 10 MG tablet Take 10 mg by mouth daily.     Marland Kitchen atorvastatin (LIPITOR) 40 MG tablet Take 40 mg by mouth daily.    .  cephALEXin (KEFLEX) 500 MG capsule Take 500 mg by mouth 2 (two) times daily.  0  . Cranberry 250 MG CAPS Take 250 mg by mouth daily.    Marland Kitchen econazole nitrate 1 % cream Apply 1 application topically daily as needed (to groin rash as needed).    Marland Kitchen esomeprazole (NEXIUM) 20 MG capsule Take 40 mg by mouth daily before breakfast.    . ferrous sulfate 325 (65 FE) MG tablet Take 325 mg by mouth daily with breakfast.    . fluticasone (FLONASE) 50 MCG/ACT nasal spray Place 2 sprays into the nose daily as needed for allergies.    . furosemide (LASIX) 80 MG tablet Take 80 mg by mouth daily. 1 am    . glucose blood (ONETOUCH VERIO) test strip Use as instructed to check blood sugar 2 times per day dx code E11.65 100 each 5  . HUMALOG KWIKPEN 100 UNIT/ML KiwkPen Inject 7 Units into the skin 2 (two) times daily with a meal.  3  . Insulin Pen Needle 31G X 5 MM MISC Use 3 pen needles per day 100 each 3  . KLOR-CON M10 10 MEQ tablet TAKE 3 TABLETS IN THE MORNING AND 3 TABLET IN THE EVENING 180 tablet 3  . metolazone (ZAROXOLYN) 5 MG tablet   2  . metoprolol (LOPRESSOR) 50 MG tablet Take 50 mg by mouth 2 (two) times daily.    . Multiple Vitamins-Minerals (CENTRUM  SILVER ADULT 50+) TABS Take 1 tablet by mouth.     Glory Rosebush DELICA LANCETS FINE MISC Use to check blood sugar 2 times per day dx code 250.02 100 each 1  . pantoprazole (PROTONIX) 40 MG tablet Take 40 mg by mouth daily.  5  . VICTOZA 18 MG/3ML SOPN INJECT 1.2MG  SUBCUTANEOUSLY DAILY 18 mL 1  . warfarin (COUMADIN) 5 MG tablet USE AS DIRECTED 30 tablet 3   No current facility-administered medications for this visit.    Allergies:   Lotensin and Zantac    Social History:  The patient  reports that she has quit smoking. Her smoking use included Cigarettes. She has never used smokeless tobacco. She reports that she does not drink alcohol or use illicit drugs.   Family History:  The patient's family history includes Diabetes in her mother and sister.     ROS:  Please see the history of present illness.   Otherwise, review of systems are positive for left lower extremity swelling and warmth. She has had this previously and it resolved with therapy as cellulitis. DVT has been excluded in the past. She is on chronic anticoagulation therapy..   All other systems are reviewed and negative.    PHYSICAL EXAM: VS:  BP 132/60 mmHg  Pulse 61  Ht 5\' 6"  (1.676 m)  Wt 90.175 kg (198 lb 12.8 oz)  BMI 32.10 kg/m2  SpO2 97% , BMI Body mass index is 32.1 kg/(m^2). GEN: Well nourished, well developed, in no acute distress, elderly, obese, wheelchair-bound. HEENT: normal Neck: no JVD, carotid bruits, or masses Cardiac: RRR; no murmurs, rubs, or gallops,. There is 2-3+ left lower extremity edema with skin erythema above the knee, warmth, and lichenification of the skin. This is compatible with the clinical diagnosis of cellulitis that is being treated by her primary care physician. Previous evaluation for DVT under similar circumstances was negative. The patient is on Coumadin therapy. Respiratory:  clear to auscultation bilaterally, normal work of breathing GI: soft, nontender, nondistended, + BS MS: no deformity or atrophy Skin: warm and dry, no rash Neuro:  Strength and sensation are intact Psych: euthymic mood, full affect   EKG:  EKG is not ordered today.    Recent Labs: 03/28/2014: TSH 1.90 07/27/2014: ALT 35 10/27/2014: BUN 52*; Creatinine, Ser 1.88*; Potassium 3.4*; Sodium 137    Lipid Panel    Component Value Date/Time   CHOL 114 10/27/2014 0944   TRIG 109.0 10/27/2014 0944   HDL 51.60 10/27/2014 0944   CHOLHDL 2 10/27/2014 0944   VLDL 21.8 10/27/2014 0944   LDLCALC 41 10/27/2014 0944   LDLDIRECT 37.8 10/27/2013 0808      Wt Readings from Last 3 Encounters:  11/23/14 90.175 kg (198 lb 12.8 oz)  10/27/14 94.348 kg (208 lb)  07/27/14 96.798 kg (213 lb 6.4 oz)      Other studies Reviewed: Additional studies/ records that were  reviewed today include: reviewed records and laboratory data obtained by other clinicians within this IT platform. No recent TSH or liver panel..    ASSESSMENT AND PLAN:  1. Paroxysmal atrial fibrillation Rhythm control with amiodarone  2. Chronic diastolic heart failure No evidence of volume overload  3. Essential hypertension controlled  4. On amiodarone therapy No side effects.  5. Chronic kidney disease, stage IV (severe) Followed by primary  6. Long term current use of anticoagulant therapy Coumadin therapy without bleeding or complications.  7. Left lower extremity edema Being treated as cellulitis. Patient is on  chronic anticoagulation therapy, making DVT unlikely.    Current medicines are reviewed at length with the patient today.  The patient does not have concerns regarding medicines.  The following changes have been made:  No change in medical therapy. We will do laboratory data to rule out amiodarone toxicity.  Labs/ tests ordered today include:   Orders Placed This Encounter  Procedures  . TSH  . Hepatic function panel     Disposition:   FU with HS in 6 months  Signed, Sinclair Grooms, MD  11/23/2014 8:56 AM    [facility] [address], [place], [place]  [address] Phone: [phone]; Fax: [phone]

## 2014-11-23 ENCOUNTER — Encounter: Payer: Self-pay | Admitting: Interventional Cardiology

## 2014-11-23 ENCOUNTER — Ambulatory Visit (INDEPENDENT_AMBULATORY_CARE_PROVIDER_SITE_OTHER): Payer: Medicare Other | Admitting: Interventional Cardiology

## 2014-11-23 VITALS — BP 132/60 | HR 61 | Ht 66.0 in | Wt 198.8 lb

## 2014-11-23 DIAGNOSIS — N184 Chronic kidney disease, stage 4 (severe): Secondary | ICD-10-CM

## 2014-11-23 DIAGNOSIS — I5032 Chronic diastolic (congestive) heart failure: Secondary | ICD-10-CM | POA: Diagnosis not present

## 2014-11-23 DIAGNOSIS — I10 Essential (primary) hypertension: Secondary | ICD-10-CM

## 2014-11-23 DIAGNOSIS — Z79899 Other long term (current) drug therapy: Secondary | ICD-10-CM

## 2014-11-23 DIAGNOSIS — Z7901 Long term (current) use of anticoagulants: Secondary | ICD-10-CM

## 2014-11-23 DIAGNOSIS — I480 Paroxysmal atrial fibrillation: Secondary | ICD-10-CM

## 2014-11-23 LAB — HEPATIC FUNCTION PANEL
ALBUMIN: 3.8 g/dL (ref 3.5–5.2)
ALK PHOS: 50 U/L (ref 39–117)
ALT: 23 U/L (ref 0–35)
AST: 32 U/L (ref 0–37)
Bilirubin, Direct: 0.1 mg/dL (ref 0.0–0.3)
Total Bilirubin: 0.6 mg/dL (ref 0.2–1.2)
Total Protein: 7.5 g/dL (ref 6.0–8.3)

## 2014-11-23 LAB — TSH: TSH: 1.95 u[IU]/mL (ref 0.35–4.50)

## 2014-11-23 NOTE — Patient Instructions (Signed)
Medication Instructions:  Your physician recommends that you continue on your current medications as directed. Please refer to the Current Medication list given to you today.   Labwork: Tsh, Lft today  Testing/Procedures: None ordered  Follow-Up: Your physician wants you to follow-up in: 6 months with Dr.Smith You will receive a reminder letter in the mail two months in advance. If you don't receive a letter, please call our office to schedule the follow-up appointment.   Any Other Special Instructions Will Be Listed Below (If Applicable).

## 2014-11-24 ENCOUNTER — Telehealth: Payer: Self-pay

## 2014-11-24 NOTE — Telephone Encounter (Signed)
-----   Message from Belva Crome, MD sent at 11/23/2014  6:35 PM EDT ----- Liver and thyroid are normal.

## 2014-11-24 NOTE — Telephone Encounter (Signed)
Pt aware of lab results. Liver and thyroid are normal.  Pt verbalized understanding.

## 2014-11-25 DIAGNOSIS — Y92009 Unspecified place in unspecified non-institutional (private) residence as the place of occurrence of the external cause: Secondary | ICD-10-CM

## 2014-11-25 DIAGNOSIS — W19XXXA Unspecified fall, initial encounter: Secondary | ICD-10-CM

## 2014-11-25 HISTORY — DX: Unspecified place in unspecified non-institutional (private) residence as the place of occurrence of the external cause: Y92.009

## 2014-11-25 HISTORY — DX: Unspecified place in unspecified non-institutional (private) residence as the place of occurrence of the external cause: W19.XXXA

## 2014-12-03 ENCOUNTER — Inpatient Hospital Stay (HOSPITAL_COMMUNITY)
Admission: EM | Admit: 2014-12-03 | Discharge: 2014-12-08 | DRG: 183 | Disposition: A | Discharge status: Skilled Nursing Facility | Payer: Medicare Other | Attending: Internal Medicine | Admitting: Internal Medicine

## 2014-12-03 ENCOUNTER — Encounter (HOSPITAL_COMMUNITY): Payer: Self-pay | Admitting: Emergency Medicine

## 2014-12-03 ENCOUNTER — Emergency Department (HOSPITAL_COMMUNITY): Payer: Medicare Other

## 2014-12-03 DIAGNOSIS — E785 Hyperlipidemia, unspecified: Secondary | ICD-10-CM | POA: Diagnosis present

## 2014-12-03 DIAGNOSIS — I4891 Unspecified atrial fibrillation: Secondary | ICD-10-CM | POA: Diagnosis present

## 2014-12-03 DIAGNOSIS — Z794 Long term (current) use of insulin: Secondary | ICD-10-CM

## 2014-12-03 DIAGNOSIS — E1122 Type 2 diabetes mellitus with diabetic chronic kidney disease: Secondary | ICD-10-CM | POA: Diagnosis present

## 2014-12-03 DIAGNOSIS — S2231XA Fracture of one rib, right side, initial encounter for closed fracture: Secondary | ICD-10-CM

## 2014-12-03 DIAGNOSIS — I129 Hypertensive chronic kidney disease with stage 1 through stage 4 chronic kidney disease, or unspecified chronic kidney disease: Secondary | ICD-10-CM | POA: Diagnosis present

## 2014-12-03 DIAGNOSIS — S2241XA Multiple fractures of ribs, right side, initial encounter for closed fracture: Secondary | ICD-10-CM | POA: Diagnosis not present

## 2014-12-03 DIAGNOSIS — Z7901 Long term (current) use of anticoagulants: Secondary | ICD-10-CM

## 2014-12-03 DIAGNOSIS — L03116 Cellulitis of left lower limb: Secondary | ICD-10-CM | POA: Diagnosis present

## 2014-12-03 DIAGNOSIS — E876 Hypokalemia: Secondary | ICD-10-CM | POA: Diagnosis not present

## 2014-12-03 DIAGNOSIS — R52 Pain, unspecified: Secondary | ICD-10-CM

## 2014-12-03 DIAGNOSIS — A419 Sepsis, unspecified organism: Secondary | ICD-10-CM | POA: Diagnosis not present

## 2014-12-03 DIAGNOSIS — Z91018 Allergy to other foods: Secondary | ICD-10-CM

## 2014-12-03 DIAGNOSIS — Z888 Allergy status to other drugs, medicaments and biological substances status: Secondary | ICD-10-CM

## 2014-12-03 DIAGNOSIS — K219 Gastro-esophageal reflux disease without esophagitis: Secondary | ICD-10-CM | POA: Diagnosis present

## 2014-12-03 DIAGNOSIS — I5032 Chronic diastolic (congestive) heart failure: Secondary | ICD-10-CM | POA: Diagnosis present

## 2014-12-03 DIAGNOSIS — K5900 Constipation, unspecified: Secondary | ICD-10-CM | POA: Diagnosis not present

## 2014-12-03 DIAGNOSIS — W19XXXA Unspecified fall, initial encounter: Secondary | ICD-10-CM | POA: Diagnosis present

## 2014-12-03 DIAGNOSIS — J189 Pneumonia, unspecified organism: Secondary | ICD-10-CM | POA: Diagnosis present

## 2014-12-03 DIAGNOSIS — N179 Acute kidney failure, unspecified: Secondary | ICD-10-CM | POA: Diagnosis present

## 2014-12-03 DIAGNOSIS — E1129 Type 2 diabetes mellitus with other diabetic kidney complication: Secondary | ICD-10-CM | POA: Diagnosis present

## 2014-12-03 DIAGNOSIS — Y92009 Unspecified place in unspecified non-institutional (private) residence as the place of occurrence of the external cause: Secondary | ICD-10-CM

## 2014-12-03 DIAGNOSIS — Z79899 Other long term (current) drug therapy: Secondary | ICD-10-CM

## 2014-12-03 DIAGNOSIS — Z87891 Personal history of nicotine dependence: Secondary | ICD-10-CM

## 2014-12-03 DIAGNOSIS — R0602 Shortness of breath: Secondary | ICD-10-CM

## 2014-12-03 DIAGNOSIS — Z833 Family history of diabetes mellitus: Secondary | ICD-10-CM

## 2014-12-03 DIAGNOSIS — N183 Chronic kidney disease, stage 3 (moderate): Secondary | ICD-10-CM | POA: Diagnosis present

## 2014-12-03 DIAGNOSIS — R0781 Pleurodynia: Secondary | ICD-10-CM | POA: Diagnosis present

## 2014-12-03 DIAGNOSIS — I10 Essential (primary) hypertension: Secondary | ICD-10-CM | POA: Diagnosis present

## 2014-12-03 HISTORY — DX: Pain, unspecified: R52

## 2014-12-03 LAB — URINALYSIS, ROUTINE W REFLEX MICROSCOPIC
BILIRUBIN URINE: NEGATIVE
GLUCOSE, UA: NEGATIVE mg/dL
HGB URINE DIPSTICK: NEGATIVE
KETONES UR: NEGATIVE mg/dL
Leukocytes, UA: NEGATIVE
Nitrite: NEGATIVE
PH: 6.5 (ref 5.0–8.0)
Protein, ur: NEGATIVE mg/dL
Specific Gravity, Urine: 1.01 (ref 1.005–1.030)
Urobilinogen, UA: 0.2 mg/dL (ref 0.0–1.0)

## 2014-12-03 LAB — BASIC METABOLIC PANEL
Anion gap: 13 (ref 5–15)
BUN: 66 mg/dL — AB (ref 6–20)
CALCIUM: 9.4 mg/dL (ref 8.9–10.3)
CHLORIDE: 100 mmol/L — AB (ref 101–111)
CO2: 26 mmol/L (ref 22–32)
CREATININE: 2.3 mg/dL — AB (ref 0.44–1.00)
GFR calc Af Amer: 22 mL/min — ABNORMAL LOW (ref >60)
GFR calc non Af Amer: 19 mL/min — ABNORMAL LOW (ref >60)
Glucose, Bld: 192 mg/dL — ABNORMAL HIGH (ref 65–99)
Potassium: 3.6 mmol/L (ref 3.5–5.1)
Sodium: 139 mmol/L (ref 135–145)

## 2014-12-03 LAB — CBC
HCT: 40.1 % (ref 36.0–46.0)
Hemoglobin: 13 g/dL (ref 12.0–15.0)
MCH: 26.4 pg (ref 26.0–34.0)
MCHC: 32.4 g/dL (ref 30.0–36.0)
MCV: 81.5 fL (ref 78.0–100.0)
PLATELETS: 242 10*3/uL (ref 150–400)
RBC: 4.92 MIL/uL (ref 3.87–5.11)
RDW: 14.9 % (ref 11.5–15.5)
WBC: 11 10*3/uL — ABNORMAL HIGH (ref 4.0–10.5)

## 2014-12-03 LAB — PROTIME-INR
INR: 2.5 — ABNORMAL HIGH (ref 0.00–1.49)
Prothrombin Time: 26.7 seconds — ABNORMAL HIGH (ref 11.6–15.2)

## 2014-12-03 MED ORDER — ECONAZOLE NITRATE 1 % EX CREA: 1.0000 "application " | TOPICAL_CREAM | Freq: Every day | CUTANEOUS | Status: DC | PRN

## 2014-12-03 MED ORDER — METOLAZONE 5 MG PO TABS
5.0000 mg | ORAL_TABLET | Freq: Every day | ORAL | Status: DC
  Administered 2014-12-04 – 2014-12-05 (×2): 5 mg via ORAL
  Filled 2014-12-03 (×2): qty 1

## 2014-12-03 MED ORDER — AMIODARONE HCL 200 MG PO TABS
200.0000 mg | ORAL_TABLET | Freq: Every day | ORAL | Status: DC
  Administered 2014-12-04 – 2014-12-08 (×5): 200 mg via ORAL
  Filled 2014-12-03 (×5): qty 1

## 2014-12-03 MED ORDER — KETOCONAZOLE 2 % EX CREA
TOPICAL_CREAM | Freq: Two times a day (BID) | CUTANEOUS | Status: DC
  Administered 2014-12-03 – 2014-12-05 (×4): via TOPICAL
  Administered 2014-12-05: 1 via TOPICAL
  Administered 2014-12-06 – 2014-12-08 (×5): via TOPICAL
  Filled 2014-12-03: qty 15

## 2014-12-03 MED ORDER — SODIUM CHLORIDE 0.9 % IJ SOLN: 3.0000 mL | INTRAMUSCULAR | Status: DC | PRN

## 2014-12-03 MED ORDER — HYDROCODONE-ACETAMINOPHEN 5-325 MG PO TABS
1.0000 | ORAL_TABLET | ORAL | Status: DC | PRN
  Administered 2014-12-03: 2 via ORAL
  Administered 2014-12-04 (×3): 1 via ORAL
  Administered 2014-12-05 (×3): 2 via ORAL
  Administered 2014-12-05: 1 via ORAL
  Administered 2014-12-06 – 2014-12-08 (×7): 2 via ORAL
  Filled 2014-12-03 (×2): qty 2
  Filled 2014-12-03: qty 1
  Filled 2014-12-03 (×6): qty 2
  Filled 2014-12-03: qty 1
  Filled 2014-12-03 (×3): qty 2
  Filled 2014-12-03: qty 1
  Filled 2014-12-03: qty 2
  Filled 2014-12-03: qty 1

## 2014-12-03 MED ORDER — ATORVASTATIN CALCIUM 40 MG PO TABS
40.0000 mg | ORAL_TABLET | Freq: Every day | ORAL | Status: DC
  Administered 2014-12-04 – 2014-12-08 (×5): 40 mg via ORAL
  Filled 2014-12-03 (×5): qty 1

## 2014-12-03 MED ORDER — POTASSIUM CHLORIDE CRYS ER 10 MEQ PO TBCR
10.0000 meq | EXTENDED_RELEASE_TABLET | Freq: Every day | ORAL | Status: DC
  Administered 2014-12-04: 10 meq via ORAL
  Filled 2014-12-03: qty 1

## 2014-12-03 MED ORDER — HYDROCODONE-ACETAMINOPHEN 5-325 MG PO TABS
1.0000 | ORAL_TABLET | Freq: Once | ORAL | Status: AC
  Administered 2014-12-03: 1 via ORAL
  Filled 2014-12-03: qty 1

## 2014-12-03 MED ORDER — MORPHINE SULFATE (PF) 2 MG/ML IV SOLN
2.0000 mg | Freq: Once | INTRAVENOUS | Status: AC
  Administered 2014-12-03: 2 mg via INTRAVENOUS
  Filled 2014-12-03: qty 1

## 2014-12-03 MED ORDER — METOPROLOL TARTRATE 50 MG PO TABS
50.0000 mg | ORAL_TABLET | Freq: Two times a day (BID) | ORAL | Status: DC
  Administered 2014-12-03 – 2014-12-08 (×10): 50 mg via ORAL
  Filled 2014-12-03 (×10): qty 1

## 2014-12-03 MED ORDER — FLUTICASONE PROPIONATE 50 MCG/ACT NA SUSP
2.0000 | Freq: Every day | NASAL | Status: DC
  Administered 2014-12-04 – 2014-12-08 (×5): 2 via NASAL
  Filled 2014-12-03: qty 16

## 2014-12-03 MED ORDER — SODIUM CHLORIDE 0.9 % IV SOLN: 250.0000 mL | INTRAVENOUS | Status: DC | PRN

## 2014-12-03 MED ORDER — ONDANSETRON HCL 4 MG/2ML IJ SOLN: 4.0000 mg | Freq: Four times a day (QID) | INTRAMUSCULAR | Status: DC | PRN

## 2014-12-03 MED ORDER — PANTOPRAZOLE SODIUM 40 MG PO TBEC
40.0000 mg | DELAYED_RELEASE_TABLET | Freq: Every day | ORAL | Status: DC
  Administered 2014-12-04 – 2014-12-08 (×5): 40 mg via ORAL
  Filled 2014-12-03 (×5): qty 1

## 2014-12-03 MED ORDER — FUROSEMIDE 80 MG PO TABS
80.0000 mg | ORAL_TABLET | Freq: Every day | ORAL | Status: DC
  Administered 2014-12-04 – 2014-12-05 (×2): 80 mg via ORAL
  Filled 2014-12-03 (×2): qty 1

## 2014-12-03 MED ORDER — FERROUS SULFATE 325 (65 FE) MG PO TABS
325.0000 mg | ORAL_TABLET | Freq: Every day | ORAL | Status: DC
  Administered 2014-12-04 – 2014-12-08 (×5): 325 mg via ORAL
  Filled 2014-12-03 (×5): qty 1

## 2014-12-03 MED ORDER — LIRAGLUTIDE 18 MG/3ML ~~LOC~~ SOPN
18.0000 mg | PEN_INJECTOR | Freq: Every day | SUBCUTANEOUS | Status: DC
  Administered 2014-12-05 – 2014-12-08 (×4): 18 mg via SUBCUTANEOUS
  Filled 2014-12-03: qty 3

## 2014-12-03 MED ORDER — INSULIN ASPART 100 UNIT/ML ~~LOC~~ SOLN
0.0000 [IU] | Freq: Three times a day (TID) | SUBCUTANEOUS | Status: DC
  Administered 2014-12-04 (×2): 2 [IU] via SUBCUTANEOUS
  Administered 2014-12-04 – 2014-12-05 (×2): 3 [IU] via SUBCUTANEOUS
  Administered 2014-12-05 – 2014-12-06 (×3): 2 [IU] via SUBCUTANEOUS
  Administered 2014-12-06: 3 [IU] via SUBCUTANEOUS
  Administered 2014-12-06 – 2014-12-07 (×2): 2 [IU] via SUBCUTANEOUS
  Administered 2014-12-07 (×2): 3 [IU] via SUBCUTANEOUS
  Administered 2014-12-08: 2 [IU] via SUBCUTANEOUS
  Administered 2014-12-08: 3 [IU] via SUBCUTANEOUS
  Administered 2014-12-08: 2 [IU] via SUBCUTANEOUS

## 2014-12-03 MED ORDER — ONDANSETRON HCL 4 MG PO TABS: 4.0000 mg | ORAL_TABLET | Freq: Four times a day (QID) | ORAL | Status: DC | PRN

## 2014-12-03 MED ORDER — AMLODIPINE BESYLATE 10 MG PO TABS
10.0000 mg | ORAL_TABLET | Freq: Every day | ORAL | Status: DC
  Administered 2014-12-04 – 2014-12-08 (×5): 10 mg via ORAL
  Filled 2014-12-03 (×5): qty 1

## 2014-12-03 MED ORDER — SODIUM CHLORIDE 0.9 % IJ SOLN
3.0000 mL | Freq: Two times a day (BID) | INTRAMUSCULAR | Status: DC
  Administered 2014-12-03 – 2014-12-07 (×4): 3 mL via INTRAVENOUS

## 2014-12-03 NOTE — ED Notes (Signed)
MD at bedside. 

## 2014-12-03 NOTE — H&P (Signed)
[facility] History and Physical  LEANDER FAVRET [id number] DOB: 1934-02-24 DOA: 12/03/2014  Referring physician: Josephina Gip, PA-C PCP: Milagros Evener, MD   Chief Complaint: Fall  HPI: [name] is a 82 y.o. female with a PMH of hypertension, diabetes mellitus, atrial fibrillation, GERD, diastolic CHF who comes to the hospital due to having a fall 3 days ago in which she did her right-sided lower chest wall and her head. She is states that this fall was totally an accident, and denies having chest pain, palpitations, dizziness, diaphoresis, dyspnea, nausea prior to having this fall. She states that she had pain, particularly in her chest wall area and was taking Tylenol at home without much relief. She states that the pain was bearable the first 2 days, but today the pain was significantly worse, to the point that she had to come to the emergency department.  She states that now her pain is better after she was medicated, but still has significant pain. She is otherwise in no acute distress.   Review of Systems:  Constitutional:  No weight loss, night sweats, Fevers, chills, fatigue.  HEENT:  No headaches, Difficulty swallowing,Tooth/dental problems,Sore throat,  No sneezing, itching, ear ache, nasal congestion, post nasal drip,  Cardio-vascular:  No chest pain, Orthopnea, PND, swelling in lower extremities, anasarca, dizziness, palpitations  GI:  No heartburn, indigestion, abdominal pain, nausea, vomiting, diarrhea, change in bowel habits, loss of appetite  Resp:  Positive pleuritic chest pain. No shortness of breath with exertion or at rest. No excess mucus, no productive cough, No non-productive cough, No coughing up of blood.No change in color of mucus.No wheezing.No chest wall deformity  Skin:  no rash or lesions.  GU:  no dysuria, change in color of urine, no urgency or frequency. No flank pain.  Musculoskeletal:  No joint pain or swelling. No decreased  range of motion. No back pain.  Psych:  No change in mood or affect. No depression or anxiety. No memory loss.   Past Medical History  Diagnosis Date  . Hypertension   . Diabetes mellitus without complication   . Irregular heart beat   . Renal disorder   . Ileus, postoperative 12/17/2012   Past Surgical History  Procedure Laterality Date  . Hernia repair    . Right lumpectomy    . Cataract extraction, bilateral    . Ventral hernia repair N/A 12/12/2012    Procedure: LAPAROSCOPIC VENTRAL HERNIA;  Surgeon: Madilyn Hook, DO;  Location: [facility];  Service: General;  Laterality: N/A;  . Insertion of mesh N/A 12/12/2012    Procedure: INSERTION OF MESH;  Surgeon: Madilyn Hook, DO;  Location: [facility];  Service: General;  Laterality: N/A;  . Laparoscopic lysis of adhesions N/A 12/12/2012    Procedure: LAPAROSCOPIC LYSIS OF ADHESIONS;  Surgeon: Madilyn Hook, DO;  Location: [facility];  Service: General;  Laterality: N/A;   Social History:  reports that she has quit smoking. Her smoking use included Cigarettes. She has never used smokeless tobacco. She reports that she does not drink alcohol or use illicit drugs.  Allergies  Allergen Reactions  . Lotensin [Benazepril Hcl] Anaphylaxis  . Zantac [Ranitidine Hcl] Anaphylaxis  . Tape Itching and Rash    Please use "paper" tape only.    Family History  Problem Relation Age of Onset  . Diabetes Mother   . Diabetes Sister     Prior to Admission medications   Medication Sig Start Date End Date Taking? Authorizing Provider  amiodarone (  PACERONE) 200 MG tablet TAKE 1 TABLET (200 MG TOTAL) BY MOUTH DAILY. 10/24/14   Belva Crome, MD  amLODipine (NORVASC) 10 MG tablet Take 10 mg by mouth daily.  12/06/12   Historical Provider, MD  atorvastatin (LIPITOR) 40 MG tablet Take 40 mg by mouth daily.    Historical Provider, MD  cephALEXin (KEFLEX) 500 MG capsule Take 500 mg by mouth 2 (two) times daily. 11/20/14   Historical Provider, MD  Cranberry 250 MG CAPS Take  250 mg by mouth daily.    Historical Provider, MD  econazole nitrate 1 % cream Apply 1 application topically daily as needed (to groin rash as needed).    Historical Provider, MD  esomeprazole (NEXIUM) 20 MG capsule Take 40 mg by mouth daily before breakfast.    Historical Provider, MD  ferrous sulfate 325 (65 FE) MG tablet Take 325 mg by mouth daily with breakfast.    Historical Provider, MD  fluticasone (FLONASE) 50 MCG/ACT nasal spray Place 2 sprays into the nose daily as needed for allergies.    Historical Provider, MD  furosemide (LASIX) 80 MG tablet Take 80 mg by mouth daily. 1 am    Historical Provider, MD  glucose blood (ONETOUCH VERIO) test strip Use as instructed to check blood sugar 2 times per day dx code E11.65 12/27/13   Elayne Snare, MD  HUMALOG KWIKPEN 100 UNIT/ML KiwkPen Inject 7 Units into the skin 2 (two) times daily with a meal. 09/05/14   Historical Provider, MD  Insulin Pen Needle 31G X 5 MM MISC Use 3 pen needles per day 10/27/13   Elayne Snare, MD  KLOR-CON M10 10 MEQ tablet TAKE 3 TABLETS IN THE MORNING AND 3 TABLET IN THE EVENING 08/07/14   Elayne Snare, MD  metolazone (ZAROXOLYN) 5 MG tablet  06/19/14   Historical Provider, MD  metoprolol (LOPRESSOR) 50 MG tablet Take 50 mg by mouth 2 (two) times daily.    Historical Provider, MD  Multiple Vitamins-Minerals (CENTRUM SILVER ADULT 50+) TABS Take 1 tablet by mouth.     Historical Provider, MD  [facility] DELICA LANCETS FINE MISC Use to check blood sugar 2 times per day dx code 250.02 09/15/13   Elayne Snare, MD  pantoprazole (PROTONIX) 40 MG tablet Take 40 mg by mouth daily. 04/10/14   Historical Provider, MD  VICTOZA 18 MG/3ML SOPN INJECT 1.2MG  SUBCUTANEOUSLY DAILY 09/05/14   Elayne Snare, MD  warfarin (COUMADIN) 5 MG tablet USE AS DIRECTED 10/10/14   Belva Crome, MD   Physical Exam: Filed Vitals:   12/03/14 1930 12/03/14 1945 12/03/14 2000 12/03/14 2015  BP: 133/56 130/62 132/57 123/55  Pulse: 83 83 82 79  Temp:      TempSrc:        Resp: 21 14 17 15   SpO2: 93% 93% 94% 93%    Wt Readings from Last 3 Encounters:  11/23/14 90.175 kg (198 lb 12.8 oz)  10/27/14 94.348 kg (208 lb)  07/27/14 96.798 kg (213 lb 6.4 oz)    General:  Appears calm and comfortable Eyes: PERRL, normal lids, irises & conjunctiva ENT: grossly normal hearing, lips & tongue Neck: no LAD, masses or thyromegaly Cardiovascular: RRR, no m/r/g. No LE edema. Telemetry: SR, no arrhythmias  Respiratory: CTA bilaterally, no w/r/r. Decreased respiratory effort. Abdomen: soft, ntnd Skin: no rash or induration seen on limited exam Musculoskeletal: grossly normal tone BUE/BLE Psychiatric: grossly normal mood and affect, speech fluent and appropriate Neurologic: grossly non-focal.  Labs on Admission:  Basic Metabolic Panel:  Recent Labs Lab 12/03/14 1443  NA 139  K 3.6  CL 100*  CO2 26  GLUCOSE 192*  BUN 66*  CREATININE 2.30*  CALCIUM 9.4   CBC:  Recent Labs Lab 12/03/14 1443  WBC 11.0*  HGB 13.0  HCT 40.1  MCV 81.5  PLT 242    Radiological Exams on Admission: Dg Ribs Unilateral W/chest Right  12/03/2014   CLINICAL DATA:  Fall. Injury to right lower rib cage. Pain with breathing.  EXAM: RIGHT RIBS AND CHEST - 3+ VIEW  COMPARISON:  08/25/2013  FINDINGS: Low lung volumes. Cardiac enlargement is identified. There is aortic atherosclerosis. Pulmonary vascular congestion identified. No airspace opacity. Fractures involving the anterior aspect of the an seventh, eighth and tenth ribs.  IMPRESSION: 1. Acute anterior right rib fractures.   Electronically Signed   By: Kerby Moors M.D.   On: 12/03/2014 15:50   Ct Head Wo Contrast  12/03/2014   CLINICAL DATA:  Head injury after fall at home. No loss of consciousness.  EXAM: CT HEAD WITHOUT CONTRAST  CT CERVICAL SPINE WITHOUT CONTRAST  TECHNIQUE: Multidetector CT imaging of the head and cervical spine was performed following the standard protocol without intravenous contrast.  Multiplanar CT image reconstructions of the cervical spine were also generated.  COMPARISON:  None.  FINDINGS: CT HEAD FINDINGS  Bony calvarium appears intact. Mild diffuse cortical atrophy is noted. Mild chronic ischemic white matter disease is noted. No mass effect or midline shift is noted. Ventricular size is within normal limits. There is no evidence of mass lesion, hemorrhage or acute infarction.  CT CERVICAL SPINE FINDINGS  No fracture is noted. Grade 1 anterolisthesis of C2-3 is noted. Severe degenerative disc disease is noted at C3-4, C4-5 and C5-6 with anterior and posterior osteophyte formation. Moderate degenerative disc disease is noted at C6-7. Visualized lung apices appear normal.  IMPRESSION: Mild diffuse cortical atrophy. Mild chronic ischemic white matter disease. No acute intracranial abnormality seen.  Severe multilevel degenerative disc disease is noted in the cervical spine. No fracture or other acute abnormality is noted.   Electronically Signed   By: Marijo Conception, M.D.   On: 12/03/2014 16:29   Ct Cervical Spine Wo Contrast  12/03/2014   CLINICAL DATA:  Head injury after fall at home. No loss of consciousness.  EXAM: CT HEAD WITHOUT CONTRAST  CT CERVICAL SPINE WITHOUT CONTRAST  TECHNIQUE: Multidetector CT imaging of the head and cervical spine was performed following the standard protocol without intravenous contrast. Multiplanar CT image reconstructions of the cervical spine were also generated.  COMPARISON:  None.  FINDINGS: CT HEAD FINDINGS  Bony calvarium appears intact. Mild diffuse cortical atrophy is noted. Mild chronic ischemic white matter disease is noted. No mass effect or midline shift is noted. Ventricular size is within normal limits. There is no evidence of mass lesion, hemorrhage or acute infarction.  CT CERVICAL SPINE FINDINGS  No fracture is noted. Grade 1 anterolisthesis of C2-3 is noted. Severe degenerative disc disease is noted at C3-4, C4-5 and C5-6 with anterior  and posterior osteophyte formation. Moderate degenerative disc disease is noted at C6-7. Visualized lung apices appear normal.  IMPRESSION: Mild diffuse cortical atrophy. Mild chronic ischemic white matter disease. No acute intracranial abnormality seen.  Severe multilevel degenerative disc disease is noted in the cervical spine. No fracture or other acute abnormality is noted.   Electronically Signed   By: Marijo Conception, M.D.  On: 12/03/2014 16:29    Echocardiogram:  ------------------------------------------------------------ LV EF: 60% -  65%  ------------------------------------------------------------ Indications:   CHF - 428.0.  ------------------------------------------------------------ History:  PMH:  Atrial fibrillation. Risk factors: CKD stage III. Former tobacco use. Hypertension. Diabetes mellitus. Dyslipidemia.  ------------------------------------------------------------ Study Conclusions  - Left ventricle: The cavity size was normal. Systolic function was normal. The estimated ejection fraction was in the range of 60% to 65%. Although no diagnostic regional wall motion abnormality was identified, this possibility cannot be completely excluded on the basis of this study. - Aortic valve: Mild regurgitation. - Mitral valve: Calcified annulus. Mildly thickened leaflets . Mild regurgitation. - Left atrium: The atrium was mildly dilated. - Pulmonary arteries: Systolic pressure was moderately increased. PA peak pressure: 28mm Hg (S). Transthoracic echocardiography. M-mode, complete 2D, spectral Doppler, and color Doppler. Height: Height: 167.6cm. Height: 66in. Weight: Weight: 107.5kg. Weight: 236.5lb. Body mass index: BMI: 38.3kg/m^2. Body surface area:  BSA: 2.70m^2. Blood pressure:   144/76. Patient status: Inpatient. Location: ICU/CCU   EKG: Vent. rate 84 BPM PR interval 264 ms QRS duration 104 ms QT/QTc 414/489 ms P-R-T  axes 70 10 45 Sinus rhythm Prolonged PR interval Anterior infarct, old Baseline wander Artifact  Assessment/Plan Principal Problem:   Intractable pain Admit to the hospital for observation and pain control.  Active Problems:   HTN (hypertension) Continue amlodipine, metoprolol, furosemide and monitor blood pressure.    Atrial fibrillation Continue rate control with metoprolol and amiodarone.    Hyperlipidemia Continue atorvastatin and monitor LFTs.    GERD (gastroesophageal reflux disease) Continue proton pump inhibitor.    Diabetes mellitus with renal complications Continue current therapeutic regimen. CBG monitoring with regular insulin sliding scale at the patient is in the hospital.     Code Status: Full Code.  DVT Prophylaxis: On Warfarin. Family Communication:  Disposition Plan: Admit to observation for pain management.  Time spent: Over 70 minutes were spent during this admission.  Reubin Milan [facility] Pager [phone].

## 2014-12-03 NOTE — ED Notes (Signed)
Patient fell on Thursday from a standing position. Patient complains of right rib pain.Patient denies any LOC. Patient states she did hit her head on a table. Patient is on blood thinners. Patient states left leg  which is baseline. Patient states swelling to LE are baseline. Patient alert and Oriented on arrival/

## 2014-12-03 NOTE — ED Notes (Signed)
Attempted report to [facility] unable to accepted.

## 2014-12-03 NOTE — ED Notes (Signed)
Attempted report to [facility] RN  states she is not ready for report will call in 5 minutes.

## 2014-12-03 NOTE — ED Provider Notes (Signed)
CSN: [id number]     Arrival date & time 12/03/14  1406 History   First MD Initiated Contact with Patient 12/03/14 1444     Chief Complaint  Patient presents with  . Fall  . Rib Injury   HPI  [name] is a 81 year old female with PMHx of HTN, DM, irregular heartbeat (cannot find documented arrhythmia) on coumadin presenting after a fall with rib pain. Pt reports she fell 3 days ago and hit her head on a chair. She denies LOC at the time. She states she felt right sided rib pain since the fall and decided to come in because the pain was not improving. Pt has taken tylenol with no relief. Pt is ambulatory with a walker at baseline. States that the pain is so severe that she can only sit in a recliner now. Pain increases with coughing, sneezing, yawning, laughing and movement. Denies any other injuries sustained in the fall. Denies headaches, changes in vision, weakness, numbness or tingling in the extremities, chest pain, SOB, abdominal pain, nausea, vomiting or extremity pains. Pt is being treated for cellulitis of the left lower leg by her PCP.   Past Medical History  Diagnosis Date  . Hypertension   . Diabetes mellitus without complication   . Irregular heart beat   . Renal disorder   . Ileus, postoperative 12/17/2012   Past Surgical History  Procedure Laterality Date  . Hernia repair    . Right lumpectomy    . Cataract extraction, bilateral    . Ventral hernia repair N/A 12/12/2012    Procedure: LAPAROSCOPIC VENTRAL HERNIA;  Surgeon: Madilyn Hook, DO;  Location: [facility];  Service: General;  Laterality: N/A;  . Insertion of mesh N/A 12/12/2012    Procedure: INSERTION OF MESH;  Surgeon: Madilyn Hook, DO;  Location: [facility];  Service: General;  Laterality: N/A;  . Laparoscopic lysis of adhesions N/A 12/12/2012    Procedure: LAPAROSCOPIC LYSIS OF ADHESIONS;  Surgeon: Madilyn Hook, DO;  Location: [facility];  Service: General;  Laterality: N/A;   Family History  Problem Relation Age of Onset  .  Diabetes Mother   . Diabetes Sister    Social History  Substance Use Topics  . Smoking status: Former Smoker    Types: Cigarettes  . Smokeless tobacco: Never Used  . Alcohol Use: No   OB History    No data available     Review of Systems  Constitutional: Negative for fever and chills.  Respiratory: Negative for shortness of breath.   Cardiovascular: Negative for chest pain.  Gastrointestinal: Negative for nausea, vomiting and abdominal pain.  Musculoskeletal: Positive for back pain (chest wall) and gait problem. Negative for myalgias, joint swelling, arthralgias and neck pain.  Skin: Negative for wound.  Neurological: Negative for dizziness, syncope, weakness, light-headedness, numbness and headaches.      Allergies  Lotensin; Zantac; and Tape  Home Medications   Prior to Admission medications   Medication Sig Start Date End Date Taking? Authorizing Provider  amiodarone (PACERONE) 200 MG tablet TAKE 1 TABLET (200 MG TOTAL) BY MOUTH DAILY. 10/24/14   Belva Crome, MD  amLODipine (NORVASC) 10 MG tablet Take 10 mg by mouth daily.  12/06/12   Historical Provider, MD  atorvastatin (LIPITOR) 40 MG tablet Take 40 mg by mouth daily.    Historical Provider, MD  cephALEXin (KEFLEX) 500 MG capsule Take 500 mg by mouth 2 (two) times daily. 11/20/14   Historical Provider, MD  Cranberry 250 MG  CAPS Take 250 mg by mouth daily.    Historical Provider, MD  econazole nitrate 1 % cream Apply 1 application topically daily as needed (to groin rash as needed).    Historical Provider, MD  esomeprazole (NEXIUM) 20 MG capsule Take 40 mg by mouth daily before breakfast.    Historical Provider, MD  ferrous sulfate 325 (65 FE) MG tablet Take 325 mg by mouth daily with breakfast.    Historical Provider, MD  fluticasone (FLONASE) 50 MCG/ACT nasal spray Place 2 sprays into the nose daily as needed for allergies.    Historical Provider, MD  furosemide (LASIX) 80 MG tablet Take 80 mg by mouth daily. 1 am     Historical Provider, MD  glucose blood (ONETOUCH VERIO) test strip Use as instructed to check blood sugar 2 times per day dx code E11.65 12/27/13   Elayne Snare, MD  HUMALOG KWIKPEN 100 UNIT/ML KiwkPen Inject 7 Units into the skin 2 (two) times daily with a meal. 09/05/14   Historical Provider, MD  Insulin Pen Needle 31G X 5 MM MISC Use 3 pen needles per day 10/27/13   Elayne Snare, MD  KLOR-CON M10 10 MEQ tablet TAKE 3 TABLETS IN THE MORNING AND 3 TABLET IN THE EVENING 08/07/14   Elayne Snare, MD  metolazone (ZAROXOLYN) 5 MG tablet  06/19/14   Historical Provider, MD  metoprolol (LOPRESSOR) 50 MG tablet Take 50 mg by mouth 2 (two) times daily.    Historical Provider, MD  Multiple Vitamins-Minerals (CENTRUM SILVER ADULT 50+) TABS Take 1 tablet by mouth.     Historical Provider, MD  [facility] DELICA LANCETS FINE MISC Use to check blood sugar 2 times per day dx code 250.02 09/15/13   Elayne Snare, MD  pantoprazole (PROTONIX) 40 MG tablet Take 40 mg by mouth daily. 04/10/14   Historical Provider, MD  VICTOZA 18 MG/3ML SOPN INJECT 1.2MG  SUBCUTANEOUSLY DAILY 09/05/14   Elayne Snare, MD  warfarin (COUMADIN) 5 MG tablet USE AS DIRECTED 10/10/14   Belva Crome, MD   BP 146/60 mmHg  Pulse 82  Temp(Src) 100 F (37.8 C) (Oral)  Resp 18  SpO2 92% Physical Exam  Constitutional: She is oriented to person, place, and time. She appears well-developed and well-nourished. No distress.  HENT:  Head: Normocephalic and atraumatic.  Eyes: Conjunctivae and EOM are normal. Pupils are equal, round, and reactive to light.  Neck: Normal range of motion.  Cardiovascular: Normal rate, regular rhythm and normal heart sounds.   Pulmonary/Chest: Effort normal and breath sounds normal. No respiratory distress. She has no wheezes. She has no rales. She exhibits tenderness.  TTP over right chest wall and lateral chest wall. No bruising over chest. Pt unable to sit up 2/2 to pain  Abdominal: Soft. There is no tenderness. There is no rebound  and no guarding.  Neurological: She is alert and oriented to person, place, and time. No cranial nerve deficit.  5/5 motor strength of upper and lower extremities. Sensation to light touch intact throughout.  Skin: Skin is warm and dry.  Psychiatric: She has a normal mood and affect. Her behavior is normal.  Nursing note and vitals reviewed.   ED Course  Procedures (including critical care time) Labs Review Labs Reviewed  BASIC METABOLIC PANEL - Abnormal; Notable for the following:    Chloride 100 (*)    Glucose, Bld 192 (*)    BUN 66 (*)    Creatinine, Ser 2.30 (*)    GFR calc non Af  Amer 19 (*)    GFR calc Af Amer 22 (*)    All other components within normal limits  CBC - Abnormal; Notable for the following:    WBC 11.0 (*)    All other components within normal limits  PROTIME-INR - Abnormal; Notable for the following:    Prothrombin Time 26.7 (*)    INR 2.50 (*)    All other components within normal limits  URINALYSIS, ROUTINE W REFLEX MICROSCOPIC (NOT AT [facility])  CBG MONITORING, ED    Imaging Review Dg Ribs Unilateral W/chest Right  12/03/2014   CLINICAL DATA:  Fall. Injury to right lower rib cage. Pain with breathing.  EXAM: RIGHT RIBS AND CHEST - 3+ VIEW  COMPARISON:  08/25/2013  FINDINGS: Low lung volumes. Cardiac enlargement is identified. There is aortic atherosclerosis. Pulmonary vascular congestion identified. No airspace opacity. Fractures involving the anterior aspect of the an seventh, eighth and tenth ribs.  IMPRESSION: 1. Acute anterior right rib fractures.   Electronically Signed   By: Kerby Moors M.D.   On: 12/03/2014 15:50   Ct Head Wo Contrast  12/03/2014   CLINICAL DATA:  Head injury after fall at home. No loss of consciousness.  EXAM: CT HEAD WITHOUT CONTRAST  CT CERVICAL SPINE WITHOUT CONTRAST  TECHNIQUE: Multidetector CT imaging of the head and cervical spine was performed following the standard protocol without intravenous contrast. Multiplanar CT image  reconstructions of the cervical spine were also generated.  COMPARISON:  None.  FINDINGS: CT HEAD FINDINGS  Bony calvarium appears intact. Mild diffuse cortical atrophy is noted. Mild chronic ischemic white matter disease is noted. No mass effect or midline shift is noted. Ventricular size is within normal limits. There is no evidence of mass lesion, hemorrhage or acute infarction.  CT CERVICAL SPINE FINDINGS  No fracture is noted. Grade 1 anterolisthesis of C2-3 is noted. Severe degenerative disc disease is noted at C3-4, C4-5 and C5-6 with anterior and posterior osteophyte formation. Moderate degenerative disc disease is noted at C6-7. Visualized lung apices appear normal.  IMPRESSION: Mild diffuse cortical atrophy. Mild chronic ischemic white matter disease. No acute intracranial abnormality seen.  Severe multilevel degenerative disc disease is noted in the cervical spine. No fracture or other acute abnormality is noted.   Electronically Signed   By: Marijo Conception, M.D.   On: 12/03/2014 16:29   Ct Cervical Spine Wo Contrast  12/03/2014   CLINICAL DATA:  Head injury after fall at home. No loss of consciousness.  EXAM: CT HEAD WITHOUT CONTRAST  CT CERVICAL SPINE WITHOUT CONTRAST  TECHNIQUE: Multidetector CT imaging of the head and cervical spine was performed following the standard protocol without intravenous contrast. Multiplanar CT image reconstructions of the cervical spine were also generated.  COMPARISON:  None.  FINDINGS: CT HEAD FINDINGS  Bony calvarium appears intact. Mild diffuse cortical atrophy is noted. Mild chronic ischemic white matter disease is noted. No mass effect or midline shift is noted. Ventricular size is within normal limits. There is no evidence of mass lesion, hemorrhage or acute infarction.  CT CERVICAL SPINE FINDINGS  No fracture is noted. Grade 1 anterolisthesis of C2-3 is noted. Severe degenerative disc disease is noted at C3-4, C4-5 and C5-6 with anterior and posterior  osteophyte formation. Moderate degenerative disc disease is noted at C6-7. Visualized lung apices appear normal.  IMPRESSION: Mild diffuse cortical atrophy. Mild chronic ischemic white matter disease. No acute intracranial abnormality seen.  Severe multilevel degenerative disc disease is noted in the  cervical spine. No fracture or other acute abnormality is noted.   Electronically Signed   By: Marijo Conception, M.D.   On: 12/03/2014 16:29   I have personally reviewed and evaluated these images and lab results as part of my medical decision-making.   EKG Interpretation   Date/Time:  Sunday December 03 2014 15:07:46 EDT Ventricular Rate:  84 PR Interval:  264 QRS Duration: 104 QT Interval:  414 QTC Calculation: 489 R Axis:   10 Text Interpretation:  Sinus rhythm Prolonged PR interval Anterior infarct,  old Baseline wander Artifact When compared with ECG of 12/19/2012 Normal  sinus rhythm has replaced Atrial fibrillation Confirmed by [facility]  MD,  Nunzio Cory [phone]) on 12/03/2014 3:26:38 PM      MDM   Final diagnoses:  Rib fracture, right, closed, initial encounter   Pt presenting after a fall on Thursday with increasing right sided pain. Pt is on coumadin for "irregular heartbeat". Pt states rib pain is so severe that she cannot use her walker to ambulate anymore. Pt given vicodin and reports mild improvement. VSS. Pt non-toxic. Lungs CTAB. Neuro exam non-focal. Creatine bumped to 2.3 from 1.8 baseline. Chest Xray showing fractures of 7th, 8th, 10th ribs. Head and neck CT negative. Consulted trauma - since pt is past 48 hours, no further imaging needed, will just need pain control. Consulted hospitalist for observation admission to work on pain control and incentive spirometry. Dr. Olevia Bowens admitting pt to observation for pain control.     Lahoma Crocker Owais Pruett, PA-C 12/03/14 2149  Francine Graven, DO 12/06/14 [phone]

## 2014-12-04 ENCOUNTER — Observation Stay (HOSPITAL_COMMUNITY): Payer: Medicare Other

## 2014-12-04 DIAGNOSIS — E0921 Drug or chemical induced diabetes mellitus with diabetic nephropathy: Secondary | ICD-10-CM | POA: Diagnosis not present

## 2014-12-04 DIAGNOSIS — R52 Pain, unspecified: Secondary | ICD-10-CM | POA: Diagnosis not present

## 2014-12-04 LAB — COMPREHENSIVE METABOLIC PANEL
ALBUMIN: 2.8 g/dL — AB (ref 3.5–5.0)
ALT: 17 U/L (ref 14–54)
AST: 21 U/L (ref 15–41)
Alkaline Phosphatase: 34 U/L — ABNORMAL LOW (ref 38–126)
Anion gap: 9 (ref 5–15)
BILIRUBIN TOTAL: 1 mg/dL (ref 0.3–1.2)
BUN: 60 mg/dL — AB (ref 6–20)
CHLORIDE: 104 mmol/L (ref 101–111)
CO2: 28 mmol/L (ref 22–32)
Calcium: 8.8 mg/dL — ABNORMAL LOW (ref 8.9–10.3)
Creatinine, Ser: 2.19 mg/dL — ABNORMAL HIGH (ref 0.44–1.00)
GFR calc Af Amer: 23 mL/min — ABNORMAL LOW (ref >60)
GFR calc non Af Amer: 20 mL/min — ABNORMAL LOW (ref >60)
GLUCOSE: 212 mg/dL — AB (ref 65–99)
POTASSIUM: 3.1 mmol/L — AB (ref 3.5–5.1)
SODIUM: 141 mmol/L (ref 135–145)
TOTAL PROTEIN: 6.2 g/dL — AB (ref 6.5–8.1)

## 2014-12-04 LAB — CBC
HEMATOCRIT: 36.6 % (ref 36.0–46.0)
HEMOGLOBIN: 11.4 g/dL — AB (ref 12.0–15.0)
MCH: 25.4 pg — AB (ref 26.0–34.0)
MCHC: 31.1 g/dL (ref 30.0–36.0)
MCV: 81.5 fL (ref 78.0–100.0)
Platelets: 228 10*3/uL (ref 150–400)
RBC: 4.49 MIL/uL (ref 3.87–5.11)
RDW: 14.9 % (ref 11.5–15.5)
WBC: 10.7 10*3/uL — ABNORMAL HIGH (ref 4.0–10.5)

## 2014-12-04 LAB — GLUCOSE, CAPILLARY
GLUCOSE-CAPILLARY: 211 mg/dL — AB (ref 65–99)
GLUCOSE-CAPILLARY: 193 mg/dL — AB (ref 65–99)
Glucose-Capillary: 167 mg/dL — ABNORMAL HIGH (ref 65–99)
Glucose-Capillary: 180 mg/dL — ABNORMAL HIGH (ref 65–99)
Glucose-Capillary: 225 mg/dL — ABNORMAL HIGH (ref 65–99)

## 2014-12-04 LAB — PROTIME-INR
INR: 2.49 — ABNORMAL HIGH (ref 0.00–1.49)
Prothrombin Time: 26.6 seconds — ABNORMAL HIGH (ref 11.6–15.2)

## 2014-12-04 MED ORDER — CEFAZOLIN SODIUM 1-5 GM-% IV SOLN
1.0000 g | Freq: Two times a day (BID) | INTRAVENOUS | Status: DC
  Administered 2014-12-04 – 2014-12-05 (×2): 1 g via INTRAVENOUS
  Filled 2014-12-04 (×4): qty 50

## 2014-12-04 MED ORDER — MAGNESIUM HYDROXIDE 400 MG/5ML PO SUSP: 30.0000 mL | Freq: Every day | ORAL | Status: DC | PRN

## 2014-12-04 MED ORDER — POLYETHYLENE GLYCOL 3350 17 G PO PACK
17.0000 g | PACK | Freq: Every day | ORAL | Status: DC
Start: 2014-12-04 — End: 2014-12-08
  Administered 2014-12-04 – 2014-12-08 (×5): 17 g via ORAL
  Filled 2014-12-04 (×5): qty 1

## 2014-12-04 MED ORDER — POTASSIUM CHLORIDE CRYS ER 20 MEQ PO TBCR
40.0000 meq | EXTENDED_RELEASE_TABLET | Freq: Once | ORAL | Status: AC
  Administered 2014-12-04: 40 meq via ORAL
  Filled 2014-12-04: qty 2

## 2014-12-04 MED ORDER — SENNOSIDES-DOCUSATE SODIUM 8.6-50 MG PO TABS
1.0000 | ORAL_TABLET | Freq: Two times a day (BID) | ORAL | Status: DC
  Administered 2014-12-04 – 2014-12-08 (×8): 1 via ORAL
  Filled 2014-12-04 (×8): qty 1

## 2014-12-04 MED ORDER — MORPHINE SULFATE (PF) 2 MG/ML IV SOLN
1.0000 mg | INTRAVENOUS | Status: DC | PRN
  Administered 2014-12-04: 1 mg via INTRAVENOUS
  Administered 2014-12-05 – 2014-12-06 (×3): 2 mg via INTRAVENOUS
  Filled 2014-12-04 (×4): qty 1

## 2014-12-04 MED ORDER — SODIUM CHLORIDE 0.9 % IV SOLN
INTRAVENOUS | Status: DC
  Administered 2014-12-04: 12:00:00 via INTRAVENOUS
  Administered 2014-12-05: 75 mL via INTRAVENOUS
  Administered 2014-12-05 – 2014-12-06 (×2): via INTRAVENOUS
  Administered 2014-12-07: 75 mL/h via INTRAVENOUS
  Administered 2014-12-07: 21:00:00 via INTRAVENOUS

## 2014-12-04 NOTE — Evaluation (Signed)
Physical Therapy Evaluation Patient Details Name: [name] MRN: [id number] DOB: Apr 18, 1933 Today's Date: 12/04/2014   History of Present Illness  Pt adm with intractable pain due to rib fx's.  Clinical Impression  Pt admitted with above diagnosis and presents to PT with functional limitations due to deficits listed below (See PT problem list). Pt needs skilled PT to maximize independence and safety to allow discharge to SNF.      Follow Up Recommendations SNF (pt prefers [facility])    Equipment Recommendations  None recommended by PT    Recommendations for Other Services       Precautions / Restrictions Precautions Precautions: Fall      Mobility  Bed Mobility Overal bed mobility: Needs Assistance Bed Mobility: Supine to Sit;Sit to Supine     Supine to sit: Max assist Sit to supine: +2 for physical assistance;Max assist   General bed mobility comments: Assist to bring legs off bed and to elevate trunk into sitting and to bring hips to EOB. Assist to lower trunk and bring legs back up into bed returning to supine.  Transfers                    Ambulation/Gait                Stairs            Wheelchair Mobility    Modified Rankin (Stroke Patients Only)       Balance Overall balance assessment: Needs assistance Sitting-balance support: Bilateral upper extremity supported;Feet supported Sitting balance-Leahy Scale: Poor Sitting balance - Comments: upper extremity support                                     Pertinent Vitals/Pain Pain Assessment: Faces Faces Pain Scale: Hurts whole lot Pain Location: rt ribs Pain Descriptors / Indicators: Grimacing;Sharp Pain Intervention(s): Limited activity within patient's tolerance;Monitored during session;Premedicated before session;Repositioned;Utilized relaxation techniques    Home Living Family/patient expects to be discharged to:: Private residence Living Arrangements:  Spouse/significant other Available Help at Discharge: Family Type of Home: House Home Access: Stairs to enter Entrance Stairs-Rails: None Entrance Stairs-Number of Steps: 2 Home Layout: One level Home Equipment: Environmental consultant - 2 wheels      Prior Function Level of Independence: Needs assistance   Gait / Transfers Assistance Needed: amb mod I with rolling walker. Assist for stairs in/out of house.           Hand Dominance   Dominant Hand: Right    Extremity/Trunk Assessment   Upper Extremity Assessment: Generalized weakness           Lower Extremity Assessment: Generalized weakness         Communication   Communication: No difficulties  Cognition Arousal/Alertness: Awake/alert Behavior During Therapy: WFL for tasks assessed/performed Overall Cognitive Status: Within Functional Limits for tasks assessed                      General Comments      Exercises        Assessment/Plan    PT Assessment Patient needs continued PT services  PT Diagnosis Difficulty walking;Generalized weakness;Acute pain   PT Problem List Decreased strength;Decreased activity tolerance;Decreased balance;Decreased mobility;Obesity;Pain  PT Treatment Interventions DME instruction;Gait training;Functional mobility training;Therapeutic activities;Therapeutic exercise;Balance training;Patient/family education   PT Goals (Current goals can be found in the Care Plan section) Acute Rehab  PT Goals Patient Stated Goal: get better and return home PT Goal Formulation: With patient Time For Goal Achievement: 12/18/14 Potential to Achieve Goals: Fair    Frequency Min 2X/week   Barriers to discharge        Co-evaluation               End of Session Equipment Utilized During Treatment: Oxygen Activity Tolerance: Patient limited by pain Patient left: in bed;with call bell/phone within reach;with bed alarm set;with family/visitor present Nurse Communication: Mobility status          Time: 1355-1419 PT Time Calculation (min) (ACUTE ONLY): 24 min   Charges:   PT Evaluation $Initial PT Evaluation Tier I: 1 Procedure PT Treatments $Therapeutic Activity: 8-22 mins   PT G Codes:        Randilyn Foisy 12/28/14, 3:35 PM  [facility] PT [phone]

## 2014-12-04 NOTE — Progress Notes (Signed)
Patient admitted to floor from ED, patient alert and oriented x 4. Patient VSS taken and cardiac monitoring in place. Oriented patient to room information and call bell system. Admission completed.

## 2014-12-04 NOTE — Consult Note (Signed)
ANTIBIOTIC CONSULT NOTE - INITIAL  Pharmacy Consult for cefazolin Indication: cellulitis   Allergies  Allergen Reactions  . Lotensin [Benazepril Hcl] Anaphylaxis  . Zantac [Ranitidine Hcl] Anaphylaxis  . Tape Itching and Rash    Please use "paper" tape only.    Patient Measurements: Height: 5\' 6"  (167.6 cm) Weight: 210 lb (95.255 kg) IBW/kg (Calculated) : 59.3  Vital Signs: Temp: 99.3 F (37.4 C) (09/12 1317) Temp Source: Oral (09/12 1317) BP: 129/66 mmHg (09/12 1317) Pulse Rate: 71 (09/12 1317) Intake/Output from previous day: 09/11 0701 - 09/12 0700 In: 3 [I.V.:3] Out: -  Intake/Output from this shift:    Labs:  Recent Labs  12/03/14 1443 12/04/14 0448  WBC 11.0* 10.7*  HGB 13.0 11.4*  PLT 242 228  CREATININE 2.30* 2.19*   Estimated Creatinine Clearance: 24.2 mL/min (by C-G formula based on Cr of 2.19). No results for input(s): VANCOTROUGH, VANCOPEAK, VANCORANDOM, GENTTROUGH, GENTPEAK, GENTRANDOM, TOBRATROUGH, TOBRAPEAK, TOBRARND, AMIKACINPEAK, AMIKACINTROU, AMIKACIN in the last 72 hours.   Assessment: 81 yo female presenting 3 days s/p mechanical fall with rib fx  PMH: HTN, DM, Afib, GERD, CHF  ID: Abx for cellulitis of LLE. Hx of 2 courses of ABX. WBC 10.7, AF  Cefazolin 9/12>>  Renal: SCr 2.19, CrCl 24  Plan:  Cefazolin 1 gm q12h Monitor clinical status, renal fx, LOT  Levester Fresh, PharmD, BCPS Clinical Pharmacist Pager [phone] 12/04/2014 7:22 PM

## 2014-12-04 NOTE — Progress Notes (Signed)
Inpatient Diabetes Program Recommendations  AACE/ADA: New Consensus Statement on Inpatient Glycemic Control (2013)  Target Ranges:  Prepandial:   less than 140 mg/dL      Peak postprandial:   less than 180 mg/dL (1-2 hours)      Critically ill patients:  140 - 180 mg/dL   CBG (last 3)   Recent Labs  12/03/14 2228 12/04/14 0743 12/04/14 1119  GLUCAP 167* 180* 211*   Reason for Glycemic Review: Hyperglycemia with meals  Diabetes history: DM 2 Outpatient Diabetes medications: Victoza 1.8 mg Daily,  Humalog 7 units BID with meals. Current orders for Inpatient glycemic control: Victoza, Novolog Sensitive scale  Inpatient Diabetes Program Recommendations Insulin - Meal Coverage: Patient takes meal coverage at home. Please consider ordering Novolog 3 units TID with meals for meal coverage.  Thanks,  Tama Headings RN, MSN, [facility] Inpatient Diabetes Coordinator Team Pager [phone]

## 2014-12-04 NOTE — Care Management Note (Signed)
Case Management Note  Patient Details  Name: [name] MRN: [id number] Date of Birth: [birth year]  Subjective/Objective:    NCM spoke with patient and daughter [name], she lives with spouse, who is in the room also.  Patient has a cane and a rolling walker, she has been using the rolling walker a lot at home for the past weeks per daughter.  Patient states she has been to [facility] before in the past and when she went home she had home health.  Patient is having pain from rib fx's , RN will check with MD to see when she want pt eval, which will be needed.  Patient was alert and oriented at the time of conversation.  NCM will cont to follow for dc needs.                 Action/Plan:   Expected Discharge Date:  12/06/14               Expected Discharge Plan:  [place]  In-House Referral:     Discharge planning Services  CM Consult  Post Acute Care Choice:    Choice offered to:     DME Arranged:    DME Agency:     HH Arranged:    HH Agency:     Status of Service:  In process, will continue to follow  Medicare Important Message Given:    Date Medicare IM Given:    Medicare IM give by:    Date Additional Medicare IM Given:    Additional Medicare Important Message give by:     If discussed at [place] of Stay Meetings, dates discussed:    Additional Comments:  Zenon Mayo, RN 12/04/2014, 10:07 AM

## 2014-12-04 NOTE — Progress Notes (Signed)
Dr. Karleen Hampshire made aware that patient has not had a bowel movement since last Thursday. Patient also has 10/10 pain with activity and will not move out of bed with pain like that per patient. MD also made aware of redness and warmth noted to left leg and patient stated she was on antibiotic for cellulitis and stopped taking her antibiotic three days ago. Orders placed by Dr. Karleen Hampshire.

## 2014-12-04 NOTE — Progress Notes (Signed)
[facility] PROGRESS NOTE  [name] [id number] DOB: [birth year] DOA: 12/03/2014 PCP: Milagros Evener, MD Interim summary: [name] is a 81 y.o. female with a PMH of hypertension, diabetes mellitus, atrial fibrillation, GERD, diastolic CHF comes to the hospital after a mechanical fall 3 days ago in which she hit her right-sided lower chest wall and her head, she was found to have anterior rib fractures.   Assessment/Plan: 1. Anterior rib fracture pain: - pain control. And physical therapy.   2. Hypertension: well controlled. Resume home meds.   3. Rate controlled Atrial fibrillation:  Resume metoprolol and amio.   4. Constipation: start stool softeners and laxatives.   5. Mild cellulitis of the left lower extremity:  As per the patient she completed 2 courses of antibiotics in the past.  would start her on ancef and monitor.   6. Diabetes mellitus: CBG (last 3)   Recent Labs  12/04/14 0743 12/04/14 1119 12/04/14 1616  GLUCAP 180* 211* 193*    Resume SSI.   Code Status: FULL CODE.  Family Communication: family at bed side. s Disposition Plan: SNF    Consultants:  none  Procedures:  none  Antibiotics:  Ancef.   HPI/Subjective: Reports pain is not well controlled.   Objective: Filed Vitals:   12/04/14 1317  BP: 129/66  Pulse: 71  Temp: 99.3 F (37.4 C)  Resp: 20    Intake/Output Summary (Last 24 hours) at 12/04/14 1855 Last data filed at 12/04/14 1846  Gross per 24 hour  Intake 1090.5 ml  Output   1150 ml  Net  -59.5 ml   Filed Weights   12/03/14 2310  Weight: 95.255 kg (210 lb)    Exam:   General:  Alert aferbile comfortable  Cardiovascular: s1s2  Respiratory: ctab, diminished air entry at bases.   Abdomen: soft non tender non distended bowel sounds heard  Musculoskeletal: left lower extremity cellulitis.   Data Reviewed: Basic Metabolic Panel:  Recent Labs Lab 12/03/14 1443 12/04/14 0448  NA 139 141   K 3.6 3.1*  CL 100* 104  CO2 26 28  GLUCOSE 192* 212*  BUN 66* 60*  CREATININE 2.30* 2.19*  CALCIUM 9.4 8.8*   Liver Function Tests:  Recent Labs Lab 12/04/14 0448  AST 21  ALT 17  ALKPHOS 34*  BILITOT 1.0  PROT 6.2*  ALBUMIN 2.8*   No results for input(s): LIPASE, AMYLASE in the last 168 hours. No results for input(s): AMMONIA in the last 168 hours. CBC:  Recent Labs Lab 12/03/14 1443 12/04/14 0448  WBC 11.0* 10.7*  HGB 13.0 11.4*  HCT 40.1 36.6  MCV 81.5 81.5  PLT 242 228   Cardiac Enzymes: No results for input(s): CKTOTAL, CKMB, CKMBINDEX, TROPONINI in the last 168 hours. BNP (last 3 results) No results for input(s): BNP in the last 8760 hours.  ProBNP (last 3 results) No results for input(s): PROBNP in the last 8760 hours.  CBG:  Recent Labs Lab 12/03/14 2228 12/04/14 0743 12/04/14 1119 12/04/14 1616  GLUCAP 167* 180* 211* 193*    No results found for this or any previous visit (from the past 240 hour(s)).   Studies: Dg Chest 2 View  12/04/2014   CLINICAL DATA:  Shortness of Breath  EXAM: CHEST  2 VIEW  COMPARISON:  December 03, 2014  FINDINGS: There is patchy left lower lobe infiltrate. There is subsegmental atelectasis in the right base. The lungs elsewhere clear. Heart is mildly enlarged with pulmonary vascularity within normal limits.  No adenopathy. No bone lesions. There are surgical clips in the lateral right breast region.  IMPRESSION: Patchy infiltrate left lower lobe. Slight atelectasis right base. Lungs elsewhere clear. No change in cardiac silhouette.   Electronically Signed   By: Lowella Grip III M.D.   On: 12/04/2014 14:46   Dg Ribs Unilateral W/chest Right  12/03/2014   CLINICAL DATA:  Fall. Injury to right lower rib cage. Pain with breathing.  EXAM: RIGHT RIBS AND CHEST - 3+ VIEW  COMPARISON:  08/25/2013  FINDINGS: Low lung volumes. Cardiac enlargement is identified. There is aortic atherosclerosis. Pulmonary vascular congestion  identified. No airspace opacity. Fractures involving the anterior aspect of the an seventh, eighth and tenth ribs.  IMPRESSION: 1. Acute anterior right rib fractures.   Electronically Signed   By: Kerby Moors M.D.   On: 12/03/2014 15:50   Ct Head Wo Contrast  12/03/2014   CLINICAL DATA:  Head injury after fall at home. No loss of consciousness.  EXAM: CT HEAD WITHOUT CONTRAST  CT CERVICAL SPINE WITHOUT CONTRAST  TECHNIQUE: Multidetector CT imaging of the head and cervical spine was performed following the standard protocol without intravenous contrast. Multiplanar CT image reconstructions of the cervical spine were also generated.  COMPARISON:  None.  FINDINGS: CT HEAD FINDINGS  Bony calvarium appears intact. Mild diffuse cortical atrophy is noted. Mild chronic ischemic white matter disease is noted. No mass effect or midline shift is noted. Ventricular size is within normal limits. There is no evidence of mass lesion, hemorrhage or acute infarction.  CT CERVICAL SPINE FINDINGS  No fracture is noted. Grade 1 anterolisthesis of C2-3 is noted. Severe degenerative disc disease is noted at C3-4, C4-5 and C5-6 with anterior and posterior osteophyte formation. Moderate degenerative disc disease is noted at C6-7. Visualized lung apices appear normal.  IMPRESSION: Mild diffuse cortical atrophy. Mild chronic ischemic white matter disease. No acute intracranial abnormality seen.  Severe multilevel degenerative disc disease is noted in the cervical spine. No fracture or other acute abnormality is noted.   Electronically Signed   By: Marijo Conception, M.D.   On: 12/03/2014 16:29   Ct Cervical Spine Wo Contrast  12/03/2014   CLINICAL DATA:  Head injury after fall at home. No loss of consciousness.  EXAM: CT HEAD WITHOUT CONTRAST  CT CERVICAL SPINE WITHOUT CONTRAST  TECHNIQUE: Multidetector CT imaging of the head and cervical spine was performed following the standard protocol without intravenous contrast. Multiplanar CT  image reconstructions of the cervical spine were also generated.  COMPARISON:  None.  FINDINGS: CT HEAD FINDINGS  Bony calvarium appears intact. Mild diffuse cortical atrophy is noted. Mild chronic ischemic white matter disease is noted. No mass effect or midline shift is noted. Ventricular size is within normal limits. There is no evidence of mass lesion, hemorrhage or acute infarction.  CT CERVICAL SPINE FINDINGS  No fracture is noted. Grade 1 anterolisthesis of C2-3 is noted. Severe degenerative disc disease is noted at C3-4, C4-5 and C5-6 with anterior and posterior osteophyte formation. Moderate degenerative disc disease is noted at C6-7. Visualized lung apices appear normal.  IMPRESSION: Mild diffuse cortical atrophy. Mild chronic ischemic white matter disease. No acute intracranial abnormality seen.  Severe multilevel degenerative disc disease is noted in the cervical spine. No fracture or other acute abnormality is noted.   Electronically Signed   By: Marijo Conception, M.D.   On: 12/03/2014 16:29    Scheduled Meds: . amiodarone  200 mg Oral Daily  .  amLODipine  10 mg Oral Daily  . atorvastatin  40 mg Oral Daily  . ferrous sulfate  325 mg Oral Q breakfast  . fluticasone  2 spray Each Nare Daily  . furosemide  80 mg Oral Daily  . insulin aspart  0-9 Units Subcutaneous TID WC  . ketoconazole   Topical BID  . Liraglutide  18 mg Subcutaneous Daily  . metolazone  5 mg Oral Daily  . metoprolol  50 mg Oral BID  . pantoprazole  40 mg Oral Daily  . polyethylene glycol  17 g Oral Daily  . senna-docusate  1 tablet Oral BID  . sodium chloride  3 mL Intravenous Q12H   Continuous Infusions: . sodium chloride 75 mL/hr at 12/04/14 1130    Principal Problem:   Intractable pain Active Problems:   HTN (hypertension)   Atrial fibrillation   Hyperlipidemia   GERD (gastroesophageal reflux disease)   Diabetes mellitus with renal complications    Time spent: 35 minutes.     [facility] Pager [phone] If 7PM-7AM, please contact night-coverage at [email or web address], password [facility] 12/04/2014, 6:55 PM

## 2014-12-05 DIAGNOSIS — K219 Gastro-esophageal reflux disease without esophagitis: Secondary | ICD-10-CM | POA: Diagnosis not present

## 2014-12-05 DIAGNOSIS — E0921 Drug or chemical induced diabetes mellitus with diabetic nephropathy: Secondary | ICD-10-CM | POA: Diagnosis not present

## 2014-12-05 DIAGNOSIS — I480 Paroxysmal atrial fibrillation: Secondary | ICD-10-CM | POA: Diagnosis not present

## 2014-12-05 DIAGNOSIS — R52 Pain, unspecified: Secondary | ICD-10-CM | POA: Diagnosis not present

## 2014-12-05 LAB — GLUCOSE, CAPILLARY
GLUCOSE-CAPILLARY: 194 mg/dL — AB (ref 65–99)
GLUCOSE-CAPILLARY: 163 mg/dL — AB (ref 65–99)
GLUCOSE-CAPILLARY: 198 mg/dL — AB (ref 65–99)
Glucose-Capillary: 207 mg/dL — ABNORMAL HIGH (ref 65–99)

## 2014-12-05 MED ORDER — WARFARIN - PHARMACIST DOSING INPATIENT
Freq: Every day | Status: DC
  Administered 2014-12-06 – 2014-12-08 (×2)

## 2014-12-05 MED ORDER — WARFARIN SODIUM 5 MG PO TABS
5.0000 mg | ORAL_TABLET | Freq: Once | ORAL | Status: AC
  Administered 2014-12-05: 5 mg via ORAL
  Filled 2014-12-05: qty 1

## 2014-12-05 MED ORDER — MINERAL OIL RE ENEM
1.0000 | ENEMA | Freq: Once | RECTAL | Status: AC
  Administered 2014-12-05: 1 via RECTAL
  Filled 2014-12-05: qty 1

## 2014-12-05 MED ORDER — DOXYCYCLINE HYCLATE 100 MG PO TABS
100.0000 mg | ORAL_TABLET | Freq: Two times a day (BID) | ORAL | Status: DC
  Administered 2014-12-05 – 2014-12-06 (×4): 100 mg via ORAL
  Filled 2014-12-05 (×4): qty 1

## 2014-12-05 NOTE — Clinical Social Work Note (Signed)
Clinical Social Work Assessment  Patient Details  Name: [name] MRN: [id number] Date of Birth: 13-Feb-1934  Date of referral:  12/05/14               Reason for consult:  Discharge Planning, Facility Placement                Permission sought to share information with:  Facility Sport and exercise psychologist, Family Supports Permission granted to share information::  Yes, Verbal Permission Granted  Name::     Tonye Pearson  Agency::  SNFs  Relationship::     Contact Information:     Housing/Transportation Living arrangements for the past 2 months:  Single Family Home Source of Information:  Patient Patient Interpreter Needed:  None Criminal Activity/Legal Involvement Pertinent to Current Situation/Hospitalization:  No - Comment as needed Significant Relationships:  Adult Children, Siblings, Spouse Lives with:  Spouse Do you feel safe going back to the place where you live?  Yes Need for family participation in patient care:  Yes (Comment)  Care giving concerns:  Patient feels that she would benefit from short term rehab stay at [facility] prior to returning home.   Social Worker assessment / plan:  CSW and Engineer, water met with patient and patient's husband Marcello Moores at bedside to complete assessment. Patient reports that she was admitted from home where she lives with her husband Marcello Moores who is her main support. She states that she also has a supportive daughter Elmyra Ricks) and a sister that "lives 10 minutes away." CSW inquired about patient's physical abilities prior to admission. She states that she was able to complete many ADLs independently and ambulate with a walker. The patient reports that she has been to [facility] in the past and would prefer this facility at discharge. CSW explained SNF search/placement process and answered the patient's questions. Patient states that her daughter Elmyra Ricks will assist with any necessary paperwork.  Employment status:  Disabled (Comment on whether  or not currently receiving Disability), Retired Nurse, adult PT Recommendations:  [place] / Referral to community resources:  [place]  Patient/Family's Response to care:  Patient and family appear to be satisfied with the care the patient is receiving.  Patient/Family's Understanding of and Emotional Response to Diagnosis, Current Treatment, and Prognosis:  Patient appears to have good understanding of reason for her admission and realizes what her post DC needs will be.   Emotional Assessment Appearance:  Appears stated age Attitude/Demeanor/Rapport:  Other (Appropriate) Affect (typically observed):  Accepting, Appropriate, Calm, Pleasant, Stable Orientation:  Oriented to Self, Oriented to Place, Oriented to  Time, Oriented to Situation Alcohol / Substance use:  Tobacco Use (Hx of) Psych involvement (Current and /or in the community):  No (Comment)  Discharge Needs  Concerns to be addressed:  Discharge Planning Concerns Readmission within the last 30 days:  No Current discharge risk:  Physical Impairment, Chronically ill Barriers to Discharge:  Continued Medical Work up   [employer] MSW, [place], [place], [phone]

## 2014-12-05 NOTE — Clinical Social Work Placement (Signed)
   CLINICAL SOCIAL WORK PLACEMENT  NOTE  Date:  12/05/2014  Patient Details  Name: QUANDRA MOLINELLI MRN: [id number] Date of Birth: 09-Jun-1933  Clinical Social Work is seeking post-discharge placement for this patient at the [place] level of care (*CSW will initial, date and re-position this form in  chart as items are completed):  Yes   Patient/family provided with [facility] Work Department's list of facilities offering this level of care within the geographic area requested by the patient (or if unable, by the patient's family).  Yes   Patient/family informed of their freedom to choose among providers that offer the needed level of care, that participate in Medicare, Medicaid or managed care program needed by the patient, have an available bed and are willing to accept the patient.  Yes   Patient/family informed of [facility]'s ownership interest in [facility] and [facility], as well as of the fact that they are under no obligation to receive care at these facilities.  PASRR submitted to EDS on       PASRR number received on       Existing PASRR number confirmed on 12/05/14     FL2 transmitted to all facilities in geographic area requested by pt/family on 12/05/14     FL2 transmitted to all facilities within larger geographic area on       Patient informed that his/her managed care company has contracts with or will negotiate with certain facilities, including the following:            Patient/family informed of bed offers received.  Patient chooses bed at       Physician recommends and patient chooses bed at      Patient to be transferred to   on  .  Patient to be transferred to facility by       Patient family notified on   of transfer.  Name of family member notified:        PHYSICIAN Please prepare priority discharge summary, including medications, Please prepare prescriptions, Please sign FL2     Additional Comment:     _______________________________________________ Liz Beach MSW, [place], [place], [id number]

## 2014-12-05 NOTE — Evaluation (Signed)
Occupational Therapy Evaluation Patient Details Name: [name] MRN: [id number] DOB: 04-Jun-1933 Today's Date: 12/05/2014    History of Present Illness Pt adm with intractable pain due to rib fx's.   Clinical Impression   This 81 yo female admitted with above presents to acute OT with increased pain leading to decreased mobility and balance all affecting pt's PLOF of independent with BADLs and light house work. She will benefit from acute OT with follow up OT at SNF to get to a S level or better to return home with husband.    Follow Up Recommendations  SNF    Equipment Recommendations   (TBD at next venue)       Precautions / Restrictions Precautions Precautions: Fall Restrictions Weight Bearing Restrictions: No      Mobility Bed Mobility Overal bed mobility: Needs Assistance Bed Mobility: Supine to Sit     Supine to sit: Max assist (HOB all the way up and use of pad)        Transfers Overall transfer level: Needs assistance Equipment used: 2 person hand held assist Transfers: Sit to/from [facility] Sit to Stand: Mod assist;+2 physical assistance Stand pivot transfers: Mod assist;+2 physical assistance            Balance Overall balance assessment: Needs assistance;History of Falls Sitting-balance support: Bilateral upper extremity supported;Feet supported Sitting balance-Leahy Scale: Fair     Standing balance support: Bilateral upper extremity supported Standing balance-Leahy Scale: Poor                              ADL Overall ADL's : Needs assistance/impaired Eating/Feeding: Set up;Sitting   Grooming: Sitting;Minimal assistance   Upper Body Bathing: Moderate assistance;Sitting   Lower Body Bathing: Total assistance (with +2 Mod A sit<>stand)   Upper Body Dressing : Maximal assistance;Sitting   Lower Body Dressing: Total assistance (with +2 Mod A sit<>stand)   Toilet Transfer: Moderate assistance;+2 for  physical assistance;Stand-pivot;BSC   Toileting- Clothing Manipulation and Hygiene: Total assistance (with +2 Mod A sit<>stand)               Vision Additional Comments: No change from baseline          Pertinent Vitals/Pain Pain Assessment: 0-10 Pain Score: 3  Pain Location: back and right ribs Pain Descriptors / Indicators: Sore;Aching Pain Intervention(s): Monitored during session;Repositioned;Limited activity within patient's tolerance;Premedicated before session     Hand Dominance Right   Extremity/Trunk Assessment Upper Extremity Assessment Upper Extremity Assessment: Generalized weakness (decreased use only because pt's pain from rib fx)           Communication Communication Communication: No difficulties   Cognition Arousal/Alertness: Awake/alert Behavior During Therapy: WFL for tasks assessed/performed Overall Cognitive Status: Within Functional Limits for tasks assessed                                Home Living Family/patient expects to be discharged to:: Skilled nursing facility                                        Prior Functioning/Environment    Gait / Transfers Assistance Needed: amb mod I with rolling walker. Assist for stairs in/out of house.          OT Diagnosis: Generalized weakness;Acute pain  OT Problem List: Decreased strength;Decreased range of motion;Impaired balance (sitting and/or standing);Pain;Decreased knowledge of precautions;Obesity   OT Treatment/Interventions: Self-care/ADL training;Therapeutic activities;DME and/or AE instruction;Patient/family education;Balance training    OT Goals(Current goals can be found in the care plan section) Acute Rehab OT Goals Patient Stated Goal: to go to rehab to get stronger then home with my husband OT Goal Formulation: With patient Time For Goal Achievement: 12/12/14 Potential to Achieve Goals: Good  OT Frequency: Min 2X/week          End of Session  Nurse Communication:  (nursing A'd me with pt. Made her aware pt does better getting up on left side of bed)  Activity Tolerance: Patient tolerated treatment well (despite being in pain) Patient left: in bed;with call bell/phone within reach;with family/visitor present;with bed alarm set   Time: 1435-1535 OT Time Calculation (min): 60 min Charges:  OT General Charges $OT Visit: 1 Procedure OT Evaluation $Initial OT Evaluation Tier I: 1 Procedure OT Treatments $Self Care/Home Management : 38-52 mins G-Codes: OT G-codes **NOT FOR INPATIENT CLASS** Functional Assessment Tool Used: Clinical observation Functional Limitation: Self care Self Care Current Status [id number]): At least 60 percent but less than 80 percent impaired, limited or restricted Self Care Goal Status [id number]): At least 40 percent but less than 60 percent impaired, limited or restricted  Almon Register [id number] 12/05/2014, 3:31 PM

## 2014-12-05 NOTE — Progress Notes (Addendum)
[facility]                                                                              Patient Demographics  [name], is a 81 y.o. female, DOB - Sep 04, 1933, [id number]  Admit date - 12/03/2014   Admitting Physician Reubin Milan, MD  Outpatient Primary MD for the patient is Milagros Evener, MD  LOS -    Chief Complaint  Patient presents with  . Fall  . Rib Injury      Interim history 81 year old female with medical history of hypertension, diabetes, a fib, GERD, diastolic heart failure presented with fall and right sided chest pain.  She was found to have anterior rib fractures.  PT/OT pending. Patient needs SNF.  Assessment & Plan   Intractable pain secondary to anterior rib fractures -s/p fall -Continue pain control -PT consulted and recommended SNF -OT consulted and pending -continue incentive spirometry  Acute on chronic kidney disease, stage 3 -Creatinine currently 2.19, baseline 1.8 -Will hold lasix and metolazone -Continue monitor BMP  Community acquired pneumonia -CXR 12/04/2014: patchy infiltrate  -Will place patient doxycycline  Hypertension -Currently well controlled, continue amlodipine and metoprolol  Atrial fibrillation -Continue coumadin per pharmacy -Currently rate/rhythm controlled -Continue amiodarone, metoprolol  Hypokalemia -Will replace and continue to monitor BMP  Constipation -Continue bowel regimen, will add on enema  Left lower extremity cellulitis -Was placed on ancef- will switch to doxycycline -Patient has completed 2 courses of antibiotics in the past  Diabetes mellitus, type 2 -hemoglobin A1c 8 (10/27/2014) -Continue ISS and CBG monitoring  Chronic diastolic heart failure -echocardiogram in 2014: EF 60-65% -Continue to monitor daily weights, intake/output -hold diuretics   Code Status: Full  Family Communication: Husband at bedside  Disposition Plan: Admitted.  Likely discharge to SNF in  24-48hrs.  Time Spent in minutes   30 minutes  Procedures  None  Consults   None  DVT Prophylaxis  coumadin  Lab Results  Component Value Date   PLT 228 12/04/2014    Medications  Scheduled Meds: . amiodarone  200 mg Oral Daily  . amLODipine  10 mg Oral Daily  . atorvastatin  40 mg Oral Daily  .  ceFAZolin (ANCEF) IV  1 g Intravenous Q12H  . ferrous sulfate  325 mg Oral Q breakfast  . fluticasone  2 spray Each Nare Daily  . insulin aspart  0-9 Units Subcutaneous TID WC  . ketoconazole   Topical BID  . Liraglutide  18 mg Subcutaneous Daily  . metoprolol  50 mg Oral BID  . pantoprazole  40 mg Oral Daily  . polyethylene glycol  17 g Oral Daily  . senna-docusate  1 tablet Oral BID  . sodium chloride  3 mL Intravenous Q12H   Continuous Infusions: . sodium chloride 75 mL/hr at 12/05/14 0511   PRN Meds:.sodium chloride, HYDROcodone-acetaminophen, magnesium hydroxide, morphine injection, ondansetron **OR** ondansetron (ZOFRAN) IV, sodium chloride  Antibiotics    Anti-infectives    Start     Dose/Rate Route Frequency Ordered Stop   12/04/14 2000  ceFAZolin (ANCEF) IVPB 1 g/50 mL premix     1 g 100 mL/hr over 30 Minutes Intravenous Every 12  hours 12/04/14 1922          Subjective:   [name] seen and examined today.  Patient denies chest pain, shortness of breath, abdominal pain, N/V/D.  Complains of constipation.   Objective:   Filed Vitals:   12/04/14 2059 12/04/14 2307 12/05/14 0507 12/05/14 0957  BP: 133/54  128/47 132/55  Pulse: 78  77 75  Temp: 99.9 F (37.7 C)  100 F (37.8 C)   TempSrc: Oral  Oral   Resp: 18     Height:      Weight:      SpO2: 96% 94% 93%     Wt Readings from Last 3 Encounters:  12/03/14 95.255 kg (210 lb)  11/23/14 90.175 kg (198 lb 12.8 oz)  10/27/14 94.348 kg (208 lb)     Intake/Output Summary (Last 24 hours) at 12/05/14 1326 Last data filed at 12/05/14 0800  Gross per 24 hour  Intake 1956.25 ml  Output   2750 ml   Net -793.75 ml    Exam  General: Well developed, well nourished, NAD, appears stated age  HEENT: NCAT, mucous membranes moist.   Cardiovascular: S1 S2 auscultated, no rubs, murmurs or gallops. Regular rate and rhythm.  Respiratory: Clear to auscultation bilaterally with equal chest rise  Abdomen: Soft, nontender, nondistended, + bowel sounds  Extremities: warm dry without cyanosis clubbing.  LLE cellulitis- tender to touch.  Neuro: AAOx3, nonfocal  Psych: Normal affect and demeanor with intact judgement and insight  Data Review   Micro Results No results found for this or any previous visit (from the past 240 hour(s)).  Radiology Reports Dg Chest 2 View  12/04/2014   CLINICAL DATA:  Shortness of Breath  EXAM: CHEST  2 VIEW  COMPARISON:  December 03, 2014  FINDINGS: There is patchy left lower lobe infiltrate. There is subsegmental atelectasis in the right base. The lungs elsewhere clear. Heart is mildly enlarged with pulmonary vascularity within normal limits. No adenopathy. No bone lesions. There are surgical clips in the lateral right breast region.  IMPRESSION: Patchy infiltrate left lower lobe. Slight atelectasis right base. Lungs elsewhere clear. No change in cardiac silhouette.   Electronically Signed   By: Lowella Grip III M.D.   On: 12/04/2014 14:46   Dg Ribs Unilateral W/chest Right  12/03/2014   CLINICAL DATA:  Fall. Injury to right lower rib cage. Pain with breathing.  EXAM: RIGHT RIBS AND CHEST - 3+ VIEW  COMPARISON:  08/25/2013  FINDINGS: Low lung volumes. Cardiac enlargement is identified. There is aortic atherosclerosis. Pulmonary vascular congestion identified. No airspace opacity. Fractures involving the anterior aspect of the an seventh, eighth and tenth ribs.  IMPRESSION: 1. Acute anterior right rib fractures.   Electronically Signed   By: Kerby Moors M.D.   On: 12/03/2014 15:50   Ct Head Wo Contrast  12/03/2014   CLINICAL DATA:  Head injury after fall  at home. No loss of consciousness.  EXAM: CT HEAD WITHOUT CONTRAST  CT CERVICAL SPINE WITHOUT CONTRAST  TECHNIQUE: Multidetector CT imaging of the head and cervical spine was performed following the standard protocol without intravenous contrast. Multiplanar CT image reconstructions of the cervical spine were also generated.  COMPARISON:  None.  FINDINGS: CT HEAD FINDINGS  Bony calvarium appears intact. Mild diffuse cortical atrophy is noted. Mild chronic ischemic white matter disease is noted. No mass effect or midline shift is noted. Ventricular size is within normal limits. There is no evidence of mass lesion, hemorrhage or acute  infarction.  CT CERVICAL SPINE FINDINGS  No fracture is noted. Grade 1 anterolisthesis of C2-3 is noted. Severe degenerative disc disease is noted at C3-4, C4-5 and C5-6 with anterior and posterior osteophyte formation. Moderate degenerative disc disease is noted at C6-7. Visualized lung apices appear normal.  IMPRESSION: Mild diffuse cortical atrophy. Mild chronic ischemic white matter disease. No acute intracranial abnormality seen.  Severe multilevel degenerative disc disease is noted in the cervical spine. No fracture or other acute abnormality is noted.   Electronically Signed   By: Marijo Conception, M.D.   On: 12/03/2014 16:29   Ct Cervical Spine Wo Contrast  12/03/2014   CLINICAL DATA:  Head injury after fall at home. No loss of consciousness.  EXAM: CT HEAD WITHOUT CONTRAST  CT CERVICAL SPINE WITHOUT CONTRAST  TECHNIQUE: Multidetector CT imaging of the head and cervical spine was performed following the standard protocol without intravenous contrast. Multiplanar CT image reconstructions of the cervical spine were also generated.  COMPARISON:  None.  FINDINGS: CT HEAD FINDINGS  Bony calvarium appears intact. Mild diffuse cortical atrophy is noted. Mild chronic ischemic white matter disease is noted. No mass effect or midline shift is noted. Ventricular size is within normal  limits. There is no evidence of mass lesion, hemorrhage or acute infarction.  CT CERVICAL SPINE FINDINGS  No fracture is noted. Grade 1 anterolisthesis of C2-3 is noted. Severe degenerative disc disease is noted at C3-4, C4-5 and C5-6 with anterior and posterior osteophyte formation. Moderate degenerative disc disease is noted at C6-7. Visualized lung apices appear normal.  IMPRESSION: Mild diffuse cortical atrophy. Mild chronic ischemic white matter disease. No acute intracranial abnormality seen.  Severe multilevel degenerative disc disease is noted in the cervical spine. No fracture or other acute abnormality is noted.   Electronically Signed   By: Marijo Conception, M.D.   On: 12/03/2014 16:29    CBC  Recent Labs Lab 12/03/14 1443 12/04/14 0448  WBC 11.0* 10.7*  HGB 13.0 11.4*  HCT 40.1 36.6  PLT 242 228  MCV 81.5 81.5  MCH 26.4 25.4*  MCHC 32.4 31.1  RDW 14.9 14.9    Chemistries   Recent Labs Lab 12/03/14 1443 12/04/14 0448  NA 139 141  K 3.6 3.1*  CL 100* 104  CO2 26 28  GLUCOSE 192* 212*  BUN 66* 60*  CREATININE 2.30* 2.19*  CALCIUM 9.4 8.8*  AST  --  21  ALT  --  17  ALKPHOS  --  34*  BILITOT  --  1.0   ------------------------------------------------------------------------------------------------------------------ estimated creatinine clearance is 24.2 mL/min (by C-G formula based on Cr of 2.19). ------------------------------------------------------------------------------------------------------------------ No results for input(s): HGBA1C in the last 72 hours. ------------------------------------------------------------------------------------------------------------------ No results for input(s): CHOL, HDL, LDLCALC, TRIG, CHOLHDL, LDLDIRECT in the last 72 hours. ------------------------------------------------------------------------------------------------------------------ No results for input(s): TSH, T4TOTAL, T3FREE, THYROIDAB in the last 72  hours.  Invalid input(s): FREET3 ------------------------------------------------------------------------------------------------------------------ No results for input(s): VITAMINB12, FOLATE, FERRITIN, TIBC, IRON, RETICCTPCT in the last 72 hours.  Coagulation profile  Recent Labs Lab 12/03/14 1443 12/04/14 0448  INR 2.50* 2.49*    No results for input(s): DDIMER in the last 72 hours.  Cardiac Enzymes No results for input(s): CKMB, TROPONINI, MYOGLOBIN in the last 168 hours.  Invalid input(s): CK ------------------------------------------------------------------------------------------------------------------ Invalid input(s): POCBNP    Parrish Daddario D.O. on 12/05/2014 at 1:26 PM  Between 7am to 7pm - Pager - [phone]  After 7pm go to [email or web address] - password [email or web address]  And look for  the night coverage person covering for me after hours  [facility] Office  [phone]

## 2014-12-05 NOTE — Progress Notes (Signed)
PT Note - Late G Code Entry    December 20, 2014 1537  PT G-Codes **NOT FOR INPATIENT CLASS**  Functional Assessment Tool Used clinical judgement  Functional Limitation Mobility: Walking and moving around  Mobility: Walking and Moving Around Current Status [id number]) CM  Mobility: Walking and Moving Around Goal Status [phone]) CI  [facility] PT [phone]

## 2014-12-05 NOTE — Progress Notes (Signed)
ANTICOAGULATION CONSULT NOTE - Initial Consult  Pharmacy Consult for Coumadin Indication: atrial fibrillation  Allergies  Allergen Reactions  . Lotensin [Benazepril Hcl] Anaphylaxis  . Zantac [Ranitidine Hcl] Anaphylaxis  . Tape Itching and Rash    Please use "paper" tape only.    Patient Measurements: Height: 5\' 6"  (167.6 cm) Weight: 210 lb (95.255 kg) IBW/kg (Calculated) : 59.3  Vital Signs: Temp: 100 F (37.8 C) (09/13 0507) Temp Source: Oral (09/13 0507) BP: 132/55 mmHg (09/13 0957) Pulse Rate: 75 (09/13 0957)  Labs:  Recent Labs  12/03/14 1443 12/04/14 0448  HGB 13.0 11.4*  HCT 40.1 36.6  PLT 242 228  LABPROT 26.7* 26.6*  INR 2.50* 2.49*  CREATININE 2.30* 2.19*    Estimated Creatinine Clearance: 24.2 mL/min (by C-G formula based on Cr of 2.19).   Medical History: Past Medical History  Diagnosis Date  . Hypertension   . Diabetes mellitus without complication   . Irregular heart beat   . Renal disorder   . Ileus, postoperative 12/17/2012    Medications:  Prescriptions prior to admission  Medication Sig Dispense Refill Last Dose  . amiodarone (PACERONE) 200 MG tablet TAKE 1 TABLET (200 MG TOTAL) BY MOUTH DAILY. 30 tablet 6 12/03/2014 at Unknown time  . amLODipine (NORVASC) 10 MG tablet Take 10 mg by mouth daily.    12/03/2014 at Unknown time  . atorvastatin (LIPITOR) 40 MG tablet Take 40 mg by mouth daily.   12/03/2014 at Unknown time  . Cranberry 250 MG CAPS Take 250 mg by mouth daily.   12/03/2014 at Unknown time  . econazole nitrate 1 % cream Apply 1 application topically daily as needed (to groin rash as needed).   12/03/2014 at Unknown time  . esomeprazole (NEXIUM) 20 MG capsule Take 20 mg by mouth daily before breakfast.    12/03/2014 at Unknown time  . ferrous sulfate 325 (65 FE) MG tablet Take 325 mg by mouth daily with breakfast.   12/03/2014 at Unknown time  . fluticasone (FLONASE) 50 MCG/ACT nasal spray Place 2 sprays into the nose daily as needed  for allergies.   Past Week at Unknown time  . furosemide (LASIX) 80 MG tablet Take 80 mg by mouth daily. 1 am   Past Month at Unknown time  . glucose blood (ONETOUCH VERIO) test strip Use as instructed to check blood sugar 2 times per day dx code E11.65 100 each 5 12/03/2014 at Unknown time  . HUMALOG KWIKPEN 100 UNIT/ML KiwkPen Inject 7 Units into the skin 2 (two) times daily with a meal.  3 12/03/2014 at Unknown time  . Insulin Pen Needle 31G X 5 MM MISC Use 3 pen needles per day 100 each 3 12/03/2014 at Unknown time  . KLOR-CON M10 10 MEQ tablet TAKE 3 TABLETS IN THE MORNING AND 3 TABLET IN THE EVENING 180 tablet 3 12/03/2014 at Unknown time  . metolazone (ZAROXOLYN) 5 MG tablet Take 5 mg by mouth daily as needed (swelling).   2 Past Month at Unknown time  . metoprolol (LOPRESSOR) 50 MG tablet Take 50 mg by mouth 2 (two) times daily.   12/03/2014 at 1000  . Multiple Vitamins-Minerals (CENTRUM SILVER ADULT 50+) TABS Take 1 tablet by mouth daily.    12/03/2014 at Unknown time  . ONETOUCH DELICA LANCETS FINE MISC Use to check blood sugar 2 times per day dx code 250.02 100 each 1 12/03/2014 at Unknown time  . pantoprazole (PROTONIX) 40 MG tablet Take 40 mg by mouth  daily.  5 12/03/2014 at Unknown time  . polyethylene glycol (MIRALAX / GLYCOLAX) packet Take 17 g by mouth daily as needed for mild constipation.   Past Week at Unknown time  . VICTOZA 18 MG/3ML SOPN INJECT 1.2MG  SUBCUTANEOUSLY DAILY 18 mL 1 12/03/2014 at Unknown time  . warfarin (COUMADIN) 5 MG tablet USE AS DIRECTED (Patient taking differently: USE AS DIRECTED. pt takes 2.5mg  on Mondays and 5mg  on all other days) 30 tablet 3 12/03/2014 at Unknown time    Assessment: 81 yo F presenting 3 days s/p mechanical fall with multiple rib fractures.  Pt was on Coumadin PTA for afib (last dose 9/11).  Coumadin was held based on initial presentation with fall and concern for bleeding.  Now to restart.  Hgb is stable.  Home Coumadin dose = 5mg  daily except  2.5mg  on Mondays  Goal of Therapy:  INR 2-3 Monitor platelets by anticoagulation protocol: Yes   Plan:  Coumadin 5mg  PO x 1 tonight. Daily INR  [employer], Pharm.D., BCPS Clinical Pharmacist Pager [phone] 12/05/2014 1:43 PM

## 2014-12-05 NOTE — Progress Notes (Signed)
Dr. Ree Kida paged to make aware that patient has not been ordered to restart home coumadin and no VTE prophylaxis orders for patient. 5W Pharmacist also made aware and stated to let Dr. Ree Kida know. Awaiting orders.

## 2014-12-05 NOTE — Progress Notes (Signed)
PT Cancellation Note  Patient Details Name: [name] MRN: [id number] DOB: 1933/09/16   Cancelled Treatment:    Reason Eval/Treat Not Completed: Other (comment) (Pt had just been up with OT and returned to bed.) Will see tomorrow AM.   Suanne Marker 12/05/2014, 3:58 PM  [facility] PT [phone]

## 2014-12-06 DIAGNOSIS — E785 Hyperlipidemia, unspecified: Secondary | ICD-10-CM

## 2014-12-06 DIAGNOSIS — J189 Pneumonia, unspecified organism: Secondary | ICD-10-CM | POA: Diagnosis not present

## 2014-12-06 DIAGNOSIS — I10 Essential (primary) hypertension: Secondary | ICD-10-CM | POA: Diagnosis not present

## 2014-12-06 DIAGNOSIS — R52 Pain, unspecified: Secondary | ICD-10-CM

## 2014-12-06 LAB — GLUCOSE, CAPILLARY
GLUCOSE-CAPILLARY: 170 mg/dL — AB (ref 65–99)
GLUCOSE-CAPILLARY: 159 mg/dL — AB (ref 65–99)
Glucose-Capillary: 228 mg/dL — ABNORMAL HIGH (ref 65–99)
Glucose-Capillary: 210 mg/dL — ABNORMAL HIGH (ref 65–99)
Glucose-Capillary: 182 mg/dL — ABNORMAL HIGH (ref 65–99)

## 2014-12-06 LAB — BASIC METABOLIC PANEL
ANION GAP: 9 (ref 5–15)
BUN: 45 mg/dL — AB (ref 6–20)
CO2: 31 mmol/L (ref 22–32)
Calcium: 9 mg/dL (ref 8.9–10.3)
Chloride: 100 mmol/L — ABNORMAL LOW (ref 101–111)
Creatinine, Ser: 2.01 mg/dL — ABNORMAL HIGH (ref 0.44–1.00)
GFR, EST AFRICAN AMERICAN: 26 mL/min — AB (ref >60)
GFR, EST NON AFRICAN AMERICAN: 22 mL/min — AB (ref >60)
Glucose, Bld: 239 mg/dL — ABNORMAL HIGH (ref 65–99)
POTASSIUM: 2.7 mmol/L — AB (ref 3.5–5.1)
SODIUM: 140 mmol/L (ref 135–145)

## 2014-12-06 LAB — CBC
HCT: 38.8 % (ref 36.0–46.0)
HEMOGLOBIN: 12.3 g/dL (ref 12.0–15.0)
MCH: 26.1 pg (ref 26.0–34.0)
MCHC: 31.7 g/dL (ref 30.0–36.0)
MCV: 82.2 fL (ref 78.0–100.0)
PLATELETS: 222 10*3/uL (ref 150–400)
RBC: 4.72 MIL/uL (ref 3.87–5.11)
RDW: 14.8 % (ref 11.5–15.5)
WBC: 13.2 10*3/uL — AB (ref 4.0–10.5)

## 2014-12-06 LAB — PROTIME-INR
INR: 2.09 — AB (ref 0.00–1.49)
PROTHROMBIN TIME: 23.3 s — AB (ref 11.6–15.2)

## 2014-12-06 LAB — MAGNESIUM: MAGNESIUM: 1.6 mg/dL — AB (ref 1.7–2.4)

## 2014-12-06 MED ORDER — WARFARIN SODIUM 2.5 MG PO TABS: 2.5000 mg | ORAL_TABLET | ORAL | Status: DC

## 2014-12-06 MED ORDER — POTASSIUM CHLORIDE CRYS ER 20 MEQ PO TBCR
40.0000 meq | EXTENDED_RELEASE_TABLET | Freq: Two times a day (BID) | ORAL | Status: AC
  Administered 2014-12-06 (×2): 40 meq via ORAL
  Filled 2014-12-06 (×2): qty 2

## 2014-12-06 MED ORDER — WARFARIN SODIUM 5 MG PO TABS
5.0000 mg | ORAL_TABLET | ORAL | Status: DC
  Administered 2014-12-06 – 2014-12-08 (×3): 5 mg via ORAL
  Filled 2014-12-06 (×3): qty 1

## 2014-12-06 MED ORDER — SIMETHICONE 80 MG PO CHEW
160.0000 mg | CHEWABLE_TABLET | Freq: Four times a day (QID) | ORAL | Status: DC | PRN
  Administered 2014-12-06 – 2014-12-07 (×2): 160 mg via ORAL
  Filled 2014-12-06 (×2): qty 2

## 2014-12-06 NOTE — Progress Notes (Signed)
ANTICOAGULATION CONSULT NOTE - Follow Up Consult  Pharmacy Consult for Coumadin Indication: atrial fibrillation  Allergies  Allergen Reactions  . Lotensin [Benazepril Hcl] Anaphylaxis  . Zantac [Ranitidine Hcl] Anaphylaxis  . Tape Itching and Rash    Please use "paper" tape only.    Patient Measurements: Height: 5\' 6"  (167.6 cm) Weight: 207 lb 0.2 oz (93.9 kg) IBW/kg (Calculated) : 59.3  Vital Signs: Temp: 99.4 F (37.4 C) (09/14 0427) Temp Source: Oral (09/14 0427) BP: 133/52 mmHg (09/14 0427) Pulse Rate: 72 (09/14 0427)  Labs:  Recent Labs  12/03/14 1443 12/04/14 0448 12/06/14 0540  HGB 13.0 11.4* 12.3  HCT 40.1 36.6 38.8  PLT 242 228 222  LABPROT 26.7* 26.6* 23.3*  INR 2.50* 2.49* 2.09*  CREATININE 2.30* 2.19* 2.01*    Estimated Creatinine Clearance: 26.2 mL/min (by C-G formula based on Cr of 2.01).   Medications:  Prescriptions prior to admission  Medication Sig Dispense Refill Last Dose  . amiodarone (PACERONE) 200 MG tablet TAKE 1 TABLET (200 MG TOTAL) BY MOUTH DAILY. 30 tablet 6 12/03/2014 at Unknown time  . amLODipine (NORVASC) 10 MG tablet Take 10 mg by mouth daily.    12/03/2014 at Unknown time  . atorvastatin (LIPITOR) 40 MG tablet Take 40 mg by mouth daily.   12/03/2014 at Unknown time  . Cranberry 250 MG CAPS Take 250 mg by mouth daily.   12/03/2014 at Unknown time  . econazole nitrate 1 % cream Apply 1 application topically daily as needed (to groin rash as needed).   12/03/2014 at Unknown time  . esomeprazole (NEXIUM) 20 MG capsule Take 20 mg by mouth daily before breakfast.    12/03/2014 at Unknown time  . ferrous sulfate 325 (65 FE) MG tablet Take 325 mg by mouth daily with breakfast.   12/03/2014 at Unknown time  . fluticasone (FLONASE) 50 MCG/ACT nasal spray Place 2 sprays into the nose daily as needed for allergies.   Past Week at Unknown time  . furosemide (LASIX) 80 MG tablet Take 80 mg by mouth daily. 1 am   Past Month at Unknown time  . glucose  blood (ONETOUCH VERIO) test strip Use as instructed to check blood sugar 2 times per day dx code E11.65 100 each 5 12/03/2014 at Unknown time  . HUMALOG KWIKPEN 100 UNIT/ML KiwkPen Inject 7 Units into the skin 2 (two) times daily with a meal.  3 12/03/2014 at Unknown time  . Insulin Pen Needle 31G X 5 MM MISC Use 3 pen needles per day 100 each 3 12/03/2014 at Unknown time  . KLOR-CON M10 10 MEQ tablet TAKE 3 TABLETS IN THE MORNING AND 3 TABLET IN THE EVENING 180 tablet 3 12/03/2014 at Unknown time  . metolazone (ZAROXOLYN) 5 MG tablet Take 5 mg by mouth daily as needed (swelling).   2 Past Month at Unknown time  . metoprolol (LOPRESSOR) 50 MG tablet Take 50 mg by mouth 2 (two) times daily.   12/03/2014 at 1000  . Multiple Vitamins-Minerals (CENTRUM SILVER ADULT 50+) TABS Take 1 tablet by mouth daily.    12/03/2014 at Unknown time  . ONETOUCH DELICA LANCETS FINE MISC Use to check blood sugar 2 times per day dx code 250.02 100 each 1 12/03/2014 at Unknown time  . pantoprazole (PROTONIX) 40 MG tablet Take 40 mg by mouth daily.  5 12/03/2014 at Unknown time  . polyethylene glycol (MIRALAX / GLYCOLAX) packet Take 17 g by mouth daily as needed for mild constipation.  Past Week at Unknown time  . VICTOZA 18 MG/3ML SOPN INJECT 1.2MG  SUBCUTANEOUSLY DAILY 18 mL 1 12/03/2014 at Unknown time  . warfarin (COUMADIN) 5 MG tablet USE AS DIRECTED (Patient taking differently: USE AS DIRECTED. pt takes 2.5mg  on Mondays and 5mg  on all other days) 30 tablet 3 12/03/2014 at Unknown time    Assessment: 81 yo F presenting 3 days s/p mechanical fall with multiple rib fractures. Pt was on Coumadin PTA for afib (last dose 9/11). Coumadin was held based on initial presentation with fall and concern for bleeding. Restarted. Hgb is stable.  Home Coumadin dose = 5mg  daily except 2.5mg  on Mondays  Goal of Therapy:  INR 2-3 Monitor platelets by anticoagulation protocol: Yes   Plan:  Restart home dose - Coumadin 5mg  daily except  2.5mg  on Mondays Daily INR for now  [employer], Pharm.D., BCPS Clinical Pharmacist Pager [phone] 12/06/2014 11:20 AM

## 2014-12-06 NOTE — Progress Notes (Signed)
Physical Therapy Treatment Patient Details Name: [name] MRN: [id number] DOB: [birth year] Today's Date: 12/06/2014    History of Present Illness Pt adm with intractable pain due to rib fx's.    PT Comments    Pt progressing with mobility, she tolerated sitting on EOB x 15 minutes, stood briefly with RW, limited by pain. Performed seated BLE exercises.   Follow Up Recommendations  SNF (pt prefers [facility])     Equipment Recommendations  None recommended by PT    Recommendations for Other Services       Precautions / Restrictions Precautions Precautions: Fall Precaution Comments: 3 falls in past 3 years Restrictions Weight Bearing Restrictions: No    Mobility  Bed Mobility Overal bed mobility: Needs Assistance Bed Mobility: Rolling;Sidelying to Sit Rolling: Min assist Sidelying to sit: Mod assist Supine to sit:  (HOB all the way up and use of pad)     General bed mobility comments: assist to initiate movement, used rail to push up trunk  Transfers Overall transfer level: Needs assistance Equipment used: Rolling walker (2 wheeled) Transfers: Sit to/from Stand Sit to Stand: Mod assist;+2 physical assistance         General transfer comment: verbal cues for hand placement, assist to rise, pt stood 30 seconds with RW with flexed posture, pain limited tolerance  Ambulation/Gait                 Stairs            Wheelchair Mobility    Modified Rankin (Stroke Patients Only)       Balance     Sitting balance-Leahy Scale: Fair Sitting balance - Comments: sat on EOB x 15 minutes with BUE supported     Standing balance-Leahy Scale: Poor                      Cognition Arousal/Alertness: Awake/alert Behavior During Therapy: WFL for tasks assessed/performed Overall Cognitive Status: Within Functional Limits for tasks assessed                      Exercises General Exercises - Lower Extremity Ankle Circles/Pumps:  AROM;Both;10 reps;Seated Long Arc Quad: AROM;Both;10 reps;Seated    General Comments        Pertinent Vitals/Pain Pain Score: 8  Pain Location: low back Pain Descriptors / Indicators: Sore Pain Intervention(s): Premedicated before session;Monitored during session;Limited activity within patient's tolerance    Home Living                      Prior Function            PT Goals (current goals can now be found in the care plan section) Acute Rehab PT Goals Patient Stated Goal: to go to rehab to get stronger then home with my husband, be able to cook PT Goal Formulation: With patient/family Time For Goal Achievement: 12/18/14 Potential to Achieve Goals: Fair Progress towards PT goals: Progressing toward goals    Frequency  Min 2X/week    PT Plan Current plan remains appropriate    Co-evaluation             End of Session   Activity Tolerance: Patient limited by pain Patient left: in bed;with call bell/phone within reach;with bed alarm set;with family/visitor present     Time: [id number] PT Time Calculation (min) (ACUTE ONLY): 24 min  Charges:  $Therapeutic Activity: 23-37 mins  G Codes:  Functional Assessment Tool Used: clinical judgement Mobility: Walking and Moving Around Current Status [id number]): At least 40 percent but less than 60 percent impaired, limited or restricted Mobility: Walking and Moving Around Goal Status [phone]): At least 1 percent but less than 20 percent impaired, limited or restricted   Philomena Doheny 12/06/2014, 10:45 AM [phone]

## 2014-12-06 NOTE — Progress Notes (Signed)
[facility] PROGRESS NOTE  [name] [id number] DOB: April 28, 1933 DOA: 12/03/2014 PCP: Milagros Evener, MD  Assessment/Plan: Intractable pain secondary to anterior rib fractures -s/p fall -Continue pain control as tolerated -PT consulted and recommended SNF -OT consulted also with recs for SNF -continue incentive spirometry  Acute on chronic kidney disease, stage 3 -Creatinine improved to 2.01, baseline 1.8 -Have held lasix and metolazone -Continue monitor BMP  Community acquired pneumonia -CXR 12/04/2014: patchy infiltrate  -Continue patient on doxycycline. Leukocytosis has risen overnight. Will repeat CBC in AM  Hypertension -Currently well controlled, continue amlodipine and metoprolol  Atrial fibrillation -Continue coumadin per pharmacy -Currently rate/rhythm controlled -Continue amiodarone, metoprolol  Hypokalemia -Cont to replace as needed  Constipation -Continue bowel regimen, will add on enema  Left lower extremity cellulitis -Was initially placed on ancef, transitioned to doxycycline -Patient has completed 2 courses of antibiotics in the past  Diabetes mellitus, type 2 -hemoglobin A1c 8 (10/27/2014) -Continue ISS and CBG monitoring  Chronic diastolic heart failure -echocardiogram in 2014: EF 60-65% -Continue to monitor daily weights, intake/output -held diuretics secondary to ARF  Code Status: Full Family Communication: Pt in room, family at bedside (indicate person spoken with, relationship, and if by phone, the number) Disposition Plan: Pending SNF   Consultants:    Procedures:    Antibiotics:  Ancef 9/12>>>9/13  Doxycycline 9/13>>>  HPI/Subjective: Feels weak. Eager to start rehab  Objective: Filed Vitals:   12/05/14 1439 12/05/14 2102 12/06/14 0427 12/06/14 1336  BP: 116/61 131/46 133/52 127/53  Pulse: 65 73 72 73  Temp: 98.7 F (37.1 C) 99.2 F (37.3 C) 99.4 F (37.4 C) 98.9 F (37.2 C)  TempSrc: Oral Oral Oral  Oral  Resp: 20 18 17 18   Height:      Weight:   93.9 kg (207 lb 0.2 oz)   SpO2: 91% 92% 91% 95%    Intake/Output Summary (Last 24 hours) at 12/06/14 1842 Last data filed at 12/06/14 1840  Gross per 24 hour  Intake    680 ml  Output   2600 ml  Net  -1920 ml   Filed Weights   12/03/14 2310 12/06/14 0427  Weight: 95.255 kg (210 lb) 93.9 kg (207 lb 0.2 oz)    Exam:   General:  Awake, in nad  Cardiovascular: regular, s1, s2  Respiratory: normal resp effort, no wheezing  Abdomen: soft, nondistended  Musculoskeletal: perfused, no clubbing   Data Reviewed: Basic Metabolic Panel:  Recent Labs Lab 12/03/14 1443 12/04/14 0448 12/06/14 0540  NA 139 141 140  K 3.6 3.1* 2.7*  CL 100* 104 100*  CO2 26 28 31   GLUCOSE 192* 212* 239*  BUN 66* 60* 45*  CREATININE 2.30* 2.19* 2.01*  CALCIUM 9.4 8.8* 9.0  MG  --   --  1.6*   Liver Function Tests:  Recent Labs Lab 12/04/14 0448  AST 21  ALT 17  ALKPHOS 34*  BILITOT 1.0  PROT 6.2*  ALBUMIN 2.8*   No results for input(s): LIPASE, AMYLASE in the last 168 hours. No results for input(s): AMMONIA in the last 168 hours. CBC:  Recent Labs Lab 12/03/14 1443 12/04/14 0448 12/06/14 0540  WBC 11.0* 10.7* 13.2*  HGB 13.0 11.4* 12.3  HCT 40.1 36.6 38.8  MCV 81.5 81.5 82.2  PLT 242 228 222   Cardiac Enzymes: No results for input(s): CKTOTAL, CKMB, CKMBINDEX, TROPONINI in the last 168 hours. BNP (last 3 results) No results for input(s): BNP in the last 8760 hours.  ProBNP (  last 3 results) No results for input(s): PROBNP in the last 8760 hours.  CBG:  Recent Labs Lab 12/05/14 2148 12/06/14 0602 12/06/14 0753 12/06/14 1215 12/06/14 1732  GLUCAP 198* 228* 210* 182* 170*    No results found for this or any previous visit (from the past 240 hour(s)).   Studies: No results found.  Scheduled Meds: . amiodarone  200 mg Oral Daily  . amLODipine  10 mg Oral Daily  . atorvastatin  40 mg Oral Daily  .  doxycycline  100 mg Oral Q12H  . ferrous sulfate  325 mg Oral Q breakfast  . fluticasone  2 spray Each Nare Daily  . insulin aspart  0-9 Units Subcutaneous TID WC  . ketoconazole   Topical BID  . Liraglutide  18 mg Subcutaneous Daily  . metoprolol  50 mg Oral BID  . pantoprazole  40 mg Oral Daily  . polyethylene glycol  17 g Oral Daily  . potassium chloride  40 mEq Oral BID  . senna-docusate  1 tablet Oral BID  . sodium chloride  3 mL Intravenous Q12H  . warfarin  5 mg Oral Once per day on Sun Tue Wed Thu Fri Sat   And  . [START ON 12/11/2014] warfarin  2.5 mg Oral Q Mon-1800  . Warfarin - Pharmacist Dosing Inpatient   Does not apply q1800   Continuous Infusions: . sodium chloride 75 mL/hr at 12/06/14 1742    Principal Problem:   Intractable pain Active Problems:   HTN (hypertension)   Atrial fibrillation   Hyperlipidemia   GERD (gastroesophageal reflux disease)   Diabetes mellitus with renal complications    Sanela Evola, [facility] Pager [phone]. If 7PM-7AM, please contact night-coverage at [email or web address], password [facility] 12/06/2014, 6:42 PM

## 2014-12-07 DIAGNOSIS — Z7901 Long term (current) use of anticoagulants: Secondary | ICD-10-CM | POA: Diagnosis not present

## 2014-12-07 DIAGNOSIS — Z91018 Allergy to other foods: Secondary | ICD-10-CM | POA: Diagnosis not present

## 2014-12-07 DIAGNOSIS — Y92009 Unspecified place in unspecified non-institutional (private) residence as the place of occurrence of the external cause: Secondary | ICD-10-CM | POA: Diagnosis not present

## 2014-12-07 DIAGNOSIS — K5900 Constipation, unspecified: Secondary | ICD-10-CM | POA: Diagnosis not present

## 2014-12-07 DIAGNOSIS — Z794 Long term (current) use of insulin: Secondary | ICD-10-CM | POA: Diagnosis not present

## 2014-12-07 DIAGNOSIS — W19XXXA Unspecified fall, initial encounter: Secondary | ICD-10-CM | POA: Diagnosis present

## 2014-12-07 DIAGNOSIS — A419 Sepsis, unspecified organism: Secondary | ICD-10-CM | POA: Diagnosis not present

## 2014-12-07 DIAGNOSIS — I4891 Unspecified atrial fibrillation: Secondary | ICD-10-CM | POA: Diagnosis present

## 2014-12-07 DIAGNOSIS — N179 Acute kidney failure, unspecified: Secondary | ICD-10-CM | POA: Diagnosis present

## 2014-12-07 DIAGNOSIS — I10 Essential (primary) hypertension: Secondary | ICD-10-CM | POA: Diagnosis not present

## 2014-12-07 DIAGNOSIS — J189 Pneumonia, unspecified organism: Secondary | ICD-10-CM

## 2014-12-07 DIAGNOSIS — I5032 Chronic diastolic (congestive) heart failure: Secondary | ICD-10-CM | POA: Diagnosis present

## 2014-12-07 DIAGNOSIS — S2241XA Multiple fractures of ribs, right side, initial encounter for closed fracture: Secondary | ICD-10-CM | POA: Diagnosis present

## 2014-12-07 DIAGNOSIS — N183 Chronic kidney disease, stage 3 (moderate): Secondary | ICD-10-CM | POA: Diagnosis present

## 2014-12-07 DIAGNOSIS — E876 Hypokalemia: Secondary | ICD-10-CM | POA: Diagnosis not present

## 2014-12-07 DIAGNOSIS — I129 Hypertensive chronic kidney disease with stage 1 through stage 4 chronic kidney disease, or unspecified chronic kidney disease: Secondary | ICD-10-CM | POA: Diagnosis present

## 2014-12-07 DIAGNOSIS — K219 Gastro-esophageal reflux disease without esophagitis: Secondary | ICD-10-CM | POA: Diagnosis present

## 2014-12-07 DIAGNOSIS — L03116 Cellulitis of left lower limb: Secondary | ICD-10-CM | POA: Diagnosis present

## 2014-12-07 DIAGNOSIS — Z888 Allergy status to other drugs, medicaments and biological substances status: Secondary | ICD-10-CM | POA: Diagnosis not present

## 2014-12-07 DIAGNOSIS — E1122 Type 2 diabetes mellitus with diabetic chronic kidney disease: Secondary | ICD-10-CM | POA: Diagnosis present

## 2014-12-07 DIAGNOSIS — R52 Pain, unspecified: Secondary | ICD-10-CM | POA: Diagnosis not present

## 2014-12-07 DIAGNOSIS — R0781 Pleurodynia: Secondary | ICD-10-CM | POA: Diagnosis present

## 2014-12-07 DIAGNOSIS — S2231XA Fracture of one rib, right side, initial encounter for closed fracture: Secondary | ICD-10-CM | POA: Diagnosis present

## 2014-12-07 DIAGNOSIS — Z833 Family history of diabetes mellitus: Secondary | ICD-10-CM | POA: Diagnosis not present

## 2014-12-07 DIAGNOSIS — Z87891 Personal history of nicotine dependence: Secondary | ICD-10-CM | POA: Diagnosis not present

## 2014-12-07 DIAGNOSIS — Z79899 Other long term (current) drug therapy: Secondary | ICD-10-CM | POA: Diagnosis not present

## 2014-12-07 DIAGNOSIS — E785 Hyperlipidemia, unspecified: Secondary | ICD-10-CM | POA: Diagnosis present

## 2014-12-07 LAB — GLUCOSE, CAPILLARY
GLUCOSE-CAPILLARY: 212 mg/dL — AB (ref 65–99)
GLUCOSE-CAPILLARY: 231 mg/dL — AB (ref 65–99)
GLUCOSE-CAPILLARY: 183 mg/dL — AB (ref 65–99)
GLUCOSE-CAPILLARY: 226 mg/dL — AB (ref 65–99)

## 2014-12-07 LAB — CBC
HEMATOCRIT: 38 % (ref 36.0–46.0)
HEMOGLOBIN: 11.9 g/dL — AB (ref 12.0–15.0)
MCH: 25.6 pg — ABNORMAL LOW (ref 26.0–34.0)
MCHC: 31.3 g/dL (ref 30.0–36.0)
MCV: 81.9 fL (ref 78.0–100.0)
Platelets: 253 10*3/uL (ref 150–400)
RBC: 4.64 MIL/uL (ref 3.87–5.11)
RDW: 14.7 % (ref 11.5–15.5)
WBC: 12.9 10*3/uL — ABNORMAL HIGH (ref 4.0–10.5)

## 2014-12-07 LAB — BASIC METABOLIC PANEL
ANION GAP: 10 (ref 5–15)
BUN: 39 mg/dL — AB (ref 6–20)
CHLORIDE: 105 mmol/L (ref 101–111)
CO2: 29 mmol/L (ref 22–32)
Calcium: 9.1 mg/dL (ref 8.9–10.3)
Creatinine, Ser: 1.74 mg/dL — ABNORMAL HIGH (ref 0.44–1.00)
GFR calc Af Amer: 31 mL/min — ABNORMAL LOW (ref >60)
GFR, EST NON AFRICAN AMERICAN: 27 mL/min — AB (ref >60)
GLUCOSE: 215 mg/dL — AB (ref 65–99)
POTASSIUM: 2.9 mmol/L — AB (ref 3.5–5.1)
SODIUM: 144 mmol/L (ref 135–145)

## 2014-12-07 LAB — PROTIME-INR
INR: 2.03 — ABNORMAL HIGH (ref 0.00–1.49)
Prothrombin Time: 22.8 seconds — ABNORMAL HIGH (ref 11.6–15.2)

## 2014-12-07 LAB — MAGNESIUM: MAGNESIUM: 1.6 mg/dL — AB (ref 1.7–2.4)

## 2014-12-07 MED ORDER — DEXTROSE 5 % IV SOLN
1.0000 g | Freq: Every day | INTRAVENOUS | Status: DC
  Administered 2014-12-07 – 2014-12-08 (×2): 1 g via INTRAVENOUS
  Filled 2014-12-07 (×2): qty 10

## 2014-12-07 MED ORDER — HYDROCERIN EX CREA
TOPICAL_CREAM | Freq: Every day | CUTANEOUS | Status: DC
  Administered 2014-12-07 – 2014-12-08 (×2): via TOPICAL
  Filled 2014-12-07: qty 113

## 2014-12-07 MED ORDER — FUROSEMIDE 80 MG PO TABS
80.0000 mg | ORAL_TABLET | Freq: Every day | ORAL | Status: DC
  Administered 2014-12-08: 80 mg via ORAL
  Filled 2014-12-07: qty 1

## 2014-12-07 MED ORDER — DOXYCYCLINE HYCLATE 100 MG PO TABS
100.0000 mg | ORAL_TABLET | Freq: Two times a day (BID) | ORAL | Status: DC
  Administered 2014-12-07 – 2014-12-08 (×3): 100 mg via ORAL
  Filled 2014-12-07 (×3): qty 1

## 2014-12-07 MED ORDER — POTASSIUM CHLORIDE CRYS ER 20 MEQ PO TBCR
40.0000 meq | EXTENDED_RELEASE_TABLET | Freq: Two times a day (BID) | ORAL | Status: AC
  Administered 2014-12-07 (×2): 40 meq via ORAL
  Filled 2014-12-07 (×2): qty 2

## 2014-12-07 MED ORDER — MAGNESIUM SULFATE 2 GM/50ML IV SOLN
2.0000 g | Freq: Once | INTRAVENOUS | Status: AC
  Administered 2014-12-07: 2 g via INTRAVENOUS
  Filled 2014-12-07: qty 50

## 2014-12-07 NOTE — Progress Notes (Signed)
[facility] PROGRESS NOTE  [name] [id number] DOB: 1933/10/27 DOA: 12/03/2014 PCP: Milagros Evener, MD  Assessment/Plan: Intractable pain secondary to anterior rib fractures -s/p fall -Continue pain control as tolerated -PT consulted and recommended SNF -OT consulted also with recs for SNF -continue incentive spirometry  Acute on chronic kidney disease, stage 3 -Creatinine improved to 1.74, baseline 1.8 -Had held lasix and metolazone. Will resume lasix -Continue monitor BMP  Sepsis with Community acquired pneumonia -CXR 12/04/2014: patchy infiltrate  -Continued patient on doxycycline. -Pt remains with leukocytosis, noted to be febrile overnight. Have added rocephin  Hypertension -Currently well controlled, continue amlodipine and metoprolol  Atrial fibrillation -Continue coumadin per pharmacy -Currently rate/rhythm controlled -Continue amiodarone, metoprolol  Hypokalemia -Cont to replace as needed  Constipation -Continue bowel regimen, will add on enema  Left lower extremity cellulitis -Was initially placed on ancef, transitioned to doxycycline -Patient has completed 2 courses of antibiotics in the past  Diabetes mellitus, type 2 -hemoglobin A1c 8 (10/27/2014) -Continue ISS and CBG monitoring  Chronic diastolic heart failure -echocardiogram in 2014: EF 60-65% -Continue to monitor daily weights, intake/output -held diuretics secondary to ARF  Code Status: Full Family Communication: Pt in room Disposition Plan: Pending SNF   Consultants:    Procedures:    Antibiotics:  Ancef 9/12>>>9/13  Doxycycline 9/13>>>  Rocephin 9/15>>>  HPI/Subjective: States feeling "bad" today. Reports coughing this AM producing sputum  Objective: Filed Vitals:   12/06/14 2107 12/06/14 2300 12/07/14 0533 12/07/14 1443  BP: 132/58  125/59 118/48  Pulse: 93  95 79  Temp: 100.6 F (38.1 C) 100.4 F (38 C) 100.1 F (37.8 C) 98.2 F (36.8 C)  TempSrc:  Oral  Oral Oral  Resp: 18  18 18   Height:      Weight:   94.2 kg (207 lb 10.8 oz)   SpO2: 93%  94% 96%    Intake/Output Summary (Last 24 hours) at 12/07/14 1710 Last data filed at 12/07/14 1420  Gross per 24 hour  Intake 3313.75 ml  Output    450 ml  Net 2863.75 ml   Filed Weights   12/03/14 2310 12/06/14 0427 12/07/14 0533  Weight: 95.255 kg (210 lb) 93.9 kg (207 lb 0.2 oz) 94.2 kg (207 lb 10.8 oz)    Exam:   General:  Awake, in nad  Cardiovascular: regular, s1, s2  Respiratory: normal resp effort, no wheezing  Abdomen: soft, nondistended, pos BS  Musculoskeletal: perfused, no clubbing   Data Reviewed: Basic Metabolic Panel:  Recent Labs Lab 12/03/14 1443 12/04/14 0448 12/06/14 0540 12/07/14 0540  NA 139 141 140 144  K 3.6 3.1* 2.7* 2.9*  CL 100* 104 100* 105  CO2 26 28 31 29   GLUCOSE 192* 212* 239* 215*  BUN 66* 60* 45* 39*  CREATININE 2.30* 2.19* 2.01* 1.74*  CALCIUM 9.4 8.8* 9.0 9.1  MG  --   --  1.6* 1.6*   Liver Function Tests:  Recent Labs Lab 12/04/14 0448  AST 21  ALT 17  ALKPHOS 34*  BILITOT 1.0  PROT 6.2*  ALBUMIN 2.8*   No results for input(s): LIPASE, AMYLASE in the last 168 hours. No results for input(s): AMMONIA in the last 168 hours. CBC:  Recent Labs Lab 12/03/14 1443 12/04/14 0448 12/06/14 0540 12/07/14 0540  WBC 11.0* 10.7* 13.2* 12.9*  HGB 13.0 11.4* 12.3 11.9*  HCT 40.1 36.6 38.8 38.0  MCV 81.5 81.5 82.2 81.9  PLT 242 228 222 253   Cardiac Enzymes: No results for  input(s): CKTOTAL, CKMB, CKMBINDEX, TROPONINI in the last 168 hours. BNP (last 3 results) No results for input(s): BNP in the last 8760 hours.  ProBNP (last 3 results) No results for input(s): PROBNP in the last 8760 hours.  CBG:  Recent Labs Lab 12/06/14 1215 12/06/14 1732 12/06/14 2106 12/07/14 0750 12/07/14 1226  GLUCAP 182* 170* 159* 212* 231*    No results found for this or any previous visit (from the past 240 hour(s)).    Studies: No results found.  Scheduled Meds: . amiodarone  200 mg Oral Daily  . amLODipine  10 mg Oral Daily  . atorvastatin  40 mg Oral Daily  . cefTRIAXone (ROCEPHIN)  IV  1 g Intravenous Daily  . doxycycline  100 mg Oral Q12H  . ferrous sulfate  325 mg Oral Q breakfast  . fluticasone  2 spray Each Nare Daily  . hydrocerin   Topical Daily  . insulin aspart  0-9 Units Subcutaneous TID WC  . ketoconazole   Topical BID  . Liraglutide  18 mg Subcutaneous Daily  . metoprolol  50 mg Oral BID  . pantoprazole  40 mg Oral Daily  . polyethylene glycol  17 g Oral Daily  . potassium chloride  40 mEq Oral BID WC  . senna-docusate  1 tablet Oral BID  . sodium chloride  3 mL Intravenous Q12H  . warfarin  5 mg Oral Once per day on Sun Tue Wed Thu Fri Sat   And  . [START ON 12/11/2014] warfarin  2.5 mg Oral Q Mon-1800  . Warfarin - Pharmacist Dosing Inpatient   Does not apply q1800   Continuous Infusions: . sodium chloride 75 mL/hr (12/07/14 0627)    Principal Problem:   Intractable pain Active Problems:   HTN (hypertension)   Atrial fibrillation   Hyperlipidemia   GERD (gastroesophageal reflux disease)   Diabetes mellitus with renal complications   Community acquired pneumonia    [name], [place] [facility] Pager [phone]. If 7PM-7AM, please contact night-coverage at [email or web address], password [facility] 12/07/2014, 5:10 PM  LOS: 0 days

## 2014-12-07 NOTE — Progress Notes (Signed)
ANTICOAGULATION CONSULT NOTE - Follow Up Consult  Pharmacy Consult for Coumadin Indication: atrial fibrillation  Allergies  Allergen Reactions  . Lotensin [Benazepril Hcl] Anaphylaxis  . Zantac [Ranitidine Hcl] Anaphylaxis  . Tape Itching and Rash    Please use "paper" tape only.    Patient Measurements: Height: 5\' 6"  (167.6 cm) Weight: 207 lb 10.8 oz (94.2 kg) IBW/kg (Calculated) : 59.3  Vital Signs: Temp: 100.1 F (37.8 C) (09/15 0533) Temp Source: Oral (09/15 0533) BP: 125/59 mmHg (09/15 0533) Pulse Rate: 95 (09/15 0533)  Labs:  Recent Labs  12/06/14 0540 12/07/14 0540  HGB 12.3 11.9*  HCT 38.8 38.0  PLT 222 253  LABPROT 23.3* 22.8*  INR 2.09* 2.03*  CREATININE 2.01* 1.74*    Estimated Creatinine Clearance: 30.3 mL/min (by C-G formula based on Cr of 1.74).   Medications:  Prescriptions prior to admission  Medication Sig Dispense Refill Last Dose  . amiodarone (PACERONE) 200 MG tablet TAKE 1 TABLET (200 MG TOTAL) BY MOUTH DAILY. 30 tablet 6 12/03/2014 at Unknown time  . amLODipine (NORVASC) 10 MG tablet Take 10 mg by mouth daily.    12/03/2014 at Unknown time  . atorvastatin (LIPITOR) 40 MG tablet Take 40 mg by mouth daily.   12/03/2014 at Unknown time  . Cranberry 250 MG CAPS Take 250 mg by mouth daily.   12/03/2014 at Unknown time  . econazole nitrate 1 % cream Apply 1 application topically daily as needed (to groin rash as needed).   12/03/2014 at Unknown time  . esomeprazole (NEXIUM) 20 MG capsule Take 20 mg by mouth daily before breakfast.    12/03/2014 at Unknown time  . ferrous sulfate 325 (65 FE) MG tablet Take 325 mg by mouth daily with breakfast.   12/03/2014 at Unknown time  . fluticasone (FLONASE) 50 MCG/ACT nasal spray Place 2 sprays into the nose daily as needed for allergies.   Past Week at Unknown time  . furosemide (LASIX) 80 MG tablet Take 80 mg by mouth daily. 1 am   Past Month at Unknown time  . glucose blood (ONETOUCH VERIO) test strip Use as  instructed to check blood sugar 2 times per day dx code E11.65 100 each 5 12/03/2014 at Unknown time  . HUMALOG KWIKPEN 100 UNIT/ML KiwkPen Inject 7 Units into the skin 2 (two) times daily with a meal.  3 12/03/2014 at Unknown time  . Insulin Pen Needle 31G X 5 MM MISC Use 3 pen needles per day 100 each 3 12/03/2014 at Unknown time  . KLOR-CON M10 10 MEQ tablet TAKE 3 TABLETS IN THE MORNING AND 3 TABLET IN THE EVENING 180 tablet 3 12/03/2014 at Unknown time  . metolazone (ZAROXOLYN) 5 MG tablet Take 5 mg by mouth daily as needed (swelling).   2 Past Month at Unknown time  . metoprolol (LOPRESSOR) 50 MG tablet Take 50 mg by mouth 2 (two) times daily.   12/03/2014 at 1000  . Multiple Vitamins-Minerals (CENTRUM SILVER ADULT 50+) TABS Take 1 tablet by mouth daily.    12/03/2014 at Unknown time  . ONETOUCH DELICA LANCETS FINE MISC Use to check blood sugar 2 times per day dx code 250.02 100 each 1 12/03/2014 at Unknown time  . pantoprazole (PROTONIX) 40 MG tablet Take 40 mg by mouth daily.  5 12/03/2014 at Unknown time  . polyethylene glycol (MIRALAX / GLYCOLAX) packet Take 17 g by mouth daily as needed for mild constipation.   Past Week at Unknown time  .  VICTOZA 18 MG/3ML SOPN INJECT 1.2MG  SUBCUTANEOUSLY DAILY 18 mL 1 12/03/2014 at Unknown time  . warfarin (COUMADIN) 5 MG tablet USE AS DIRECTED (Patient taking differently: USE AS DIRECTED. pt takes 2.5mg  on Mondays and 5mg  on all other days) 30 tablet 3 12/03/2014 at Unknown time    Assessment: 81 yo F presenting 3 days s/p mechanical fall with multiple rib fractures. Pt was on Coumadin PTA for afib (last dose 9/11). Coumadin was held based on initial presentation with fall and concern for bleeding. Restarted on 12/05/14. Hgb slightly decreased today to 11.9. Pltc wnl/stable.  Home coumadin dose resumed and today's INR remains therapeutic, = 2.03. No bleeding noted.   Home Coumadin dose = 5mg  daily except 2.5mg  on Mondays  Goal of Therapy:  INR 2-3 Monitor  platelets by anticoagulation protocol: Yes   Plan:  Continue home dose - Coumadin 5mg  daily except 2.5mg  on Mondays Daily INR for now  Nicole Cella, RPh Clinical Pharmacist Pager: [phone] 12/07/2014 1:05 PM

## 2014-12-07 NOTE — Clinical Social Work Note (Signed)
[employer] has approved patient for SNF, Auth: [id number], and is valid through 9/17. [place] informed.  Liz Beach MSW, [place], [place], [id number]

## 2014-12-07 NOTE — Progress Notes (Signed)
Inpatient Diabetes Program Recommendations  AACE/ADA: New Consensus Statement on Inpatient Glycemic Control (2015)  Target Ranges:  Prepandial:   less than 140 mg/dL      Peak postprandial:   less than 180 mg/dL (1-2 hours)      Critically ill patients:  140 - 180 mg/dL    Results for LIZANIA, EICKMAN (MRN [id number]) as of 12/07/2014 14:08  Ref. Range 12/07/2014 07:50 12/07/2014 12:26  Glucose-Capillary Latest Ref Range: 65-99 mg/dL 212 (H) 231 (H)    Home DM Meds: Victoza 1.8 mg Daily       Humalog 7 units BID with meals  Current DM Meds: Victoza daily         Novolog Sensitive SSI (0-9 units) TID AC     MD- Please consider starting Novolog Meal Coverage-  Novolog 3 units tid with meals   Will follow Wyn Quaker RN, MSN, CDE Diabetes Coordinator Inpatient Glycemic Control Team Team Pager: [phone] (8a-5p)

## 2014-12-07 NOTE — Consult Note (Addendum)
WOC wound consult note Reason for Consult: Consult requested for left leg cellulitis. Wound type: No open wound or drainage or blisters at this time.  Pt states this leg was previously very red and swollen, and has improved since admission. Measurement: Left calf dark purplish-red with dry peeling skin, no further edema. Dressing procedure/placement/frequency: Apply Eucerin cream to left leg Q day after bathing and roughly towel drying to assist withg removal of loose skin. Discussed plan of care with patient and she verbalizes understanding. Please re-consult if further assistance is needed.  Thank-you,  Julien Girt MSN, [place], [place], [place], [place]

## 2014-12-08 LAB — CBC
HEMATOCRIT: 36.5 % (ref 36.0–46.0)
HEMOGLOBIN: 11.5 g/dL — AB (ref 12.0–15.0)
MCH: 25.6 pg — AB (ref 26.0–34.0)
MCHC: 31.5 g/dL (ref 30.0–36.0)
MCV: 81.3 fL (ref 78.0–100.0)
Platelets: 254 10*3/uL (ref 150–400)
RBC: 4.49 MIL/uL (ref 3.87–5.11)
RDW: 14.6 % (ref 11.5–15.5)
WBC: 11.8 10*3/uL — ABNORMAL HIGH (ref 4.0–10.5)

## 2014-12-08 LAB — BASIC METABOLIC PANEL
ANION GAP: 8 (ref 5–15)
Anion gap: 9 (ref 5–15)
BUN: 36 mg/dL — ABNORMAL HIGH (ref 6–20)
BUN: 37 mg/dL — AB (ref 6–20)
CALCIUM: 8.7 mg/dL — AB (ref 8.9–10.3)
CHLORIDE: 104 mmol/L (ref 101–111)
CO2: 27 mmol/L (ref 22–32)
CO2: 26 mmol/L (ref 22–32)
CREATININE: 1.65 mg/dL — AB (ref 0.44–1.00)
Calcium: 8.6 mg/dL — ABNORMAL LOW (ref 8.9–10.3)
Chloride: 103 mmol/L (ref 101–111)
Creatinine, Ser: 1.54 mg/dL — ABNORMAL HIGH (ref 0.44–1.00)
GFR calc Af Amer: 33 mL/min — ABNORMAL LOW (ref >60)
GFR calc non Af Amer: 31 mL/min — ABNORMAL LOW (ref >60)
GFR, EST AFRICAN AMERICAN: 36 mL/min — AB (ref >60)
GFR, EST NON AFRICAN AMERICAN: 28 mL/min — AB (ref >60)
Glucose, Bld: 173 mg/dL — ABNORMAL HIGH (ref 65–99)
Glucose, Bld: 213 mg/dL — ABNORMAL HIGH (ref 65–99)
POTASSIUM: 3 mmol/L — AB (ref 3.5–5.1)
POTASSIUM: 3.3 mmol/L — AB (ref 3.5–5.1)
SODIUM: 138 mmol/L (ref 135–145)
Sodium: 139 mmol/L (ref 135–145)

## 2014-12-08 LAB — PROTIME-INR
INR: 2.27 — ABNORMAL HIGH (ref 0.00–1.49)
PROTHROMBIN TIME: 24.8 s — AB (ref 11.6–15.2)

## 2014-12-08 LAB — GLUCOSE, CAPILLARY
GLUCOSE-CAPILLARY: 170 mg/dL — AB (ref 65–99)
GLUCOSE-CAPILLARY: 230 mg/dL — AB (ref 65–99)
GLUCOSE-CAPILLARY: 179 mg/dL — AB (ref 65–99)

## 2014-12-08 LAB — MAGNESIUM: Magnesium: 1.8 mg/dL (ref 1.7–2.4)

## 2014-12-08 MED ORDER — LIRAGLUTIDE 18 MG/3ML ~~LOC~~ SOPN: 1.2000 mg | PEN_INJECTOR | Freq: Every day | SUBCUTANEOUS | Status: DC

## 2014-12-08 MED ORDER — CEFUROXIME AXETIL 250 MG PO TABS: 250.0000 mg | ORAL_TABLET | Freq: Two times a day (BID) | ORAL | Status: DC

## 2014-12-08 MED ORDER — POTASSIUM CHLORIDE CRYS ER 20 MEQ PO TBCR: 20.0000 meq | EXTENDED_RELEASE_TABLET | Freq: Every day | ORAL | Status: DC

## 2014-12-08 MED ORDER — POTASSIUM CHLORIDE CRYS ER 20 MEQ PO TBCR
40.0000 meq | EXTENDED_RELEASE_TABLET | Freq: Two times a day (BID) | ORAL | Status: AC
Start: 2014-12-08 — End: 2014-12-08
  Administered 2014-12-08 (×2): 40 meq via ORAL
  Filled 2014-12-08 (×2): qty 2

## 2014-12-08 MED ORDER — DOXYCYCLINE HYCLATE 100 MG PO TABS: 100.0000 mg | ORAL_TABLET | Freq: Two times a day (BID) | ORAL | Status: DC

## 2014-12-08 NOTE — Clinical Social Work Placement (Signed)
   CLINICAL SOCIAL WORK PLACEMENT  NOTE  Date:  12/08/2014  Patient Details  Name: [name] MRN: [id number] Date of Birth: [birth year]  Clinical Social Work is seeking post-discharge placement for this patient at the [place] level of care (*CSW will initial, date and re-position this form in  chart as items are completed):  Yes   Patient/family provided with [place] Work Department's list of facilities offering this level of care within the geographic area requested by the patient (or if unable, by the patient's family).  Yes   Patient/family informed of their freedom to choose among providers that offer the needed level of care, that participate in Medicare, Medicaid or managed care program needed by the patient, have an available bed and are willing to accept the patient.  Yes   Patient/family informed of [facility]'s ownership interest in [facility] and [facility], as well as of the fact that they are under no obligation to receive care at these facilities.  PASRR submitted to EDS on       PASRR number received on       Existing PASRR number confirmed on 12/05/14     FL2 transmitted to all facilities in geographic area requested by pt/family on 12/05/14     FL2 transmitted to all facilities within larger geographic area on       Patient informed that his/her managed care company has contracts with or will negotiate with certain facilities, including the following:        Yes   Patient/family informed of bed offers received.  Patient [facility] bed at [facility]     Physician recommends and patient chooses bed at      Patient to be transferred to [facility] on 12/08/14.  Patient to be transferred to facility by Ambulance     Patient family notified on 12/08/14 of transfer.  Name of family member notified:        PHYSICIAN Please prepare priority discharge summary, including medications, Please prepare prescriptions,  Please sign FL2     Additional Comment:    Barbette Or, [place]

## 2014-12-08 NOTE — Progress Notes (Signed)
Pt prepared for d/c to SNF. IV d/c'd. Skin intact except as charted in most recent assessments. Vitals are stable. Report called to receiving facility. Pt to be transported by ambulance service. 

## 2014-12-08 NOTE — Clinical Social Work Note (Signed)
Clinical Social Worker facilitated patient discharge including contacting patient family and facility to confirm patient discharge plans.  Clinical information faxed to facility and family agreeable with plan.  CSW arranged ambulance transport via [facility] to [facility].  RN to call report prior to discharge.  Clinical Social Worker will sign off for now as social work intervention is no longer needed. Please consult us again if new need arises.  Jesse Mily Malecki, LCSW [phone] 

## 2014-12-08 NOTE — Progress Notes (Signed)
ANTICOAGULATION CONSULT NOTE - Follow Up Consult  Pharmacy Consult for Coumadin Indication: h/o  atrial fibrillation  Allergies  Allergen Reactions  . Lotensin [Benazepril Hcl] Anaphylaxis  . Zantac [Ranitidine Hcl] Anaphylaxis  . Tape Itching and Rash    Please use "paper" tape only.    Patient Measurements: Height: 5\' 6"  (167.6 cm) Weight: 209 lb 10.5 oz (95.1 kg) IBW/kg (Calculated) : 59.3  Vital Signs: Temp: 98.8 F (37.1 C) (09/16 1413) Temp Source: Oral (09/16 1413) BP: 120/55 mmHg (09/16 1413) Pulse Rate: 70 (09/16 1413)  Labs:  Recent Labs  12/06/14 0540 12/07/14 0540 12/08/14 0825 12/08/14 1501  HGB 12.3 11.9* 11.5*  --   HCT 38.8 38.0 36.5  --   PLT 222 253 254  --   LABPROT 23.3* 22.8* 24.8*  --   INR 2.09* 2.03* 2.27*  --   CREATININE 2.01* 1.74* 1.54* 1.65*    Estimated Creatinine Clearance: 32.1 mL/min (by C-G formula based on Cr of 1.65).   Medications:  Prescriptions prior to admission  Medication Sig Dispense Refill Last Dose  . amiodarone (PACERONE) 200 MG tablet TAKE 1 TABLET (200 MG TOTAL) BY MOUTH DAILY. 30 tablet 6 12/03/2014 at Unknown time  . amLODipine (NORVASC) 10 MG tablet Take 10 mg by mouth daily.    12/03/2014 at Unknown time  . atorvastatin (LIPITOR) 40 MG tablet Take 40 mg by mouth daily.   12/03/2014 at Unknown time  . Cranberry 250 MG CAPS Take 250 mg by mouth daily.   12/03/2014 at Unknown time  . econazole nitrate 1 % cream Apply 1 application topically daily as needed (to groin rash as needed).   12/03/2014 at Unknown time  . esomeprazole (NEXIUM) 20 MG capsule Take 20 mg by mouth daily before breakfast.    12/03/2014 at Unknown time  . ferrous sulfate 325 (65 FE) MG tablet Take 325 mg by mouth daily with breakfast.   12/03/2014 at Unknown time  . fluticasone (FLONASE) 50 MCG/ACT nasal spray Place 2 sprays into the nose daily as needed for allergies.   Past Week at Unknown time  . furosemide (LASIX) 80 MG tablet Take 80 mg by mouth  daily. 1 am   Past Month at Unknown time  . glucose blood (ONETOUCH VERIO) test strip Use as instructed to check blood sugar 2 times per day dx code E11.65 100 each 5 12/03/2014 at Unknown time  . HUMALOG KWIKPEN 100 UNIT/ML KiwkPen Inject 7 Units into the skin 2 (two) times daily with a meal.  3 12/03/2014 at Unknown time  . Insulin Pen Needle 31G X 5 MM MISC Use 3 pen needles per day 100 each 3 12/03/2014 at Unknown time  . KLOR-CON M10 10 MEQ tablet TAKE 3 TABLETS IN THE MORNING AND 3 TABLET IN THE EVENING 180 tablet 3 12/03/2014 at Unknown time  . metolazone (ZAROXOLYN) 5 MG tablet Take 5 mg by mouth daily as needed (swelling).   2 Past Month at Unknown time  . metoprolol (LOPRESSOR) 50 MG tablet Take 50 mg by mouth 2 (two) times daily.   12/03/2014 at 1000  . Multiple Vitamins-Minerals (CENTRUM SILVER ADULT 50+) TABS Take 1 tablet by mouth daily.    12/03/2014 at Unknown time  . ONETOUCH DELICA LANCETS FINE MISC Use to check blood sugar 2 times per day dx code 250.02 100 each 1 12/03/2014 at Unknown time  . pantoprazole (PROTONIX) 40 MG tablet Take 40 mg by mouth daily.  5 12/03/2014 at Unknown time  .  polyethylene glycol (MIRALAX / GLYCOLAX) packet Take 17 g by mouth daily as needed for mild constipation.   Past Week at Unknown time  . VICTOZA 18 MG/3ML SOPN INJECT 1.2MG  SUBCUTANEOUSLY DAILY 18 mL 1 12/03/2014 at Unknown time  . warfarin (COUMADIN) 5 MG tablet USE AS DIRECTED (Patient taking differently: USE AS DIRECTED. pt takes 2.5mg  on Mondays and 5mg  on all other days) 30 tablet 3 12/03/2014 at Unknown time    Assessment: 81 yo F presenting 3 days s/p mechanical fall with multiple rib fractures. Pt was on Coumadin PTA for afib (last dose 9/11). Coumadin was held on admit date 9/11 based on initial presentation with fall and concern for bleeding. Restarted coumadin on 12/05/14. He has received 5mg  each day since resumed.  Hgb stable to at 11.5. PLTC remains wnl/stable.  Home coumadin dose resumed  and today's INR remains therapeutic, = 2.27 on usual home dose of coumadin 5mg  daily except 2.5mg  on Mon. No bleeding noted.   Home Coumadin dose = 5mg  daily except 2.5mg  on Mondays  Goal of Therapy:  INR 2-3 Monitor platelets by anticoagulation protocol: Yes   Plan:  Continue home dose - Coumadin 5mg  daily except 2.5mg  on Mondays Daily INR for now  Nicole Cella, RPh Clinical Pharmacist Pager: [phone] 12/08/2014 4:54 PM

## 2014-12-08 NOTE — Discharge Summary (Signed)
Physician Discharge Summary  [name] [id number] DOB: 1933-08-11 DOA: 12/03/2014  PCP: Milagros Evener, MD  Admit date: 12/03/2014 Discharge date: 12/08/2014  Time spent: 20 minutes  Recommendations for Outpatient Follow-up:  1. Follow up with PCP in 1-2 weeks 2. Repeat renal panel in 1-2 weeks, focus on renal function and potassium 3. Repeat INR in 3-5 days  Discharge Diagnoses:  Principal Problem:   Intractable pain Active Problems:   HTN (hypertension)   Atrial fibrillation   Hyperlipidemia   GERD (gastroesophageal reflux disease)   Diabetes mellitus with renal complications   Community acquired pneumonia   Discharge Condition: Improved  Diet recommendation: Heart healthy  Filed Weights   12/06/14 0427 12/07/14 0533 12/08/14 0429  Weight: 93.9 kg (207 lb 0.2 oz) 94.2 kg (207 lb 10.8 oz) 95.1 kg (209 lb 10.5 oz)    History of present illness:  Please see admit h and p from 9/11 for details. Briefly, pt presented s/p fall resulting in intractable pain. Patient was admitted for further work up.  Hospital Course:  Intractable pain secondary to anterior rib fractures -Patient was s/p fall -Continued with pain control as tolerated -PT was consulted and recommended SNF -OT consulted also with recs for SNF -continued incentive spirometry  Acute on chronic kidney disease, stage 3 -Creatinine improved to 1.54, baseline 1.8 -Had held lasix and metolazone initially with lasix later resumed  Sepsis with Community acquired pneumonia -CXR 12/04/2014 noted patchy infiltrate  -Continued patient on doxycycline. -Pt remained with leukocytosis, noted to be febrile thus added rocephin -Pt has since remained afebrile over 24hrs and reports feeling much better today  Hypertension -Currently well controlled, continue amlodipine and metoprolol  Atrial fibrillation -Continue coumadin per pharmacy -Currently rate/rhythm controlled -Continue amiodarone,  metoprolol  Hypokalemia -Cont to replace as needed  Constipation -Continue bowel regimen, will add on enema  Left lower extremity cellulitis -Was initially placed on ancef, transitioned to doxycycline -Patient has completed 2 courses of antibiotics in the past  Diabetes mellitus, type 2 -hemoglobin A1c 8 (10/27/2014) -Continue ISS and CBG monitoring  Chronic diastolic heart failure -echocardiogram in 2014: EF 60-65% -Continue to monitor daily weights, intake/output -held diuretics secondary to ARF  Consultations:  Ancef 9/12>>>9/13  Doxycycline 9/13>>>  Rocephin 9/15>>>  Discharge Exam: Filed Vitals:   12/08/14 0429 12/08/14 1006 12/08/14 1007 12/08/14 1413  BP: 130/68 125/52 125/52 120/55  Pulse: 79  78 70  Temp: 98.8 F (37.1 C)   98.8 F (37.1 C)  TempSrc: Oral   Oral  Resp: 20     Height:      Weight: 95.1 kg (209 lb 10.5 oz)     SpO2: 93%   92%    General: Awake, in nad Cardiovascular: regular, s1, s2 Respiratory: normal resp effort, no wheezing  Discharge Instructions     Medication List    STOP taking these medications        metolazone 5 MG tablet  Commonly known as:  ZAROXOLYN      TAKE these medications        amiodarone 200 MG tablet  Commonly known as:  PACERONE  TAKE 1 TABLET (200 MG TOTAL) BY MOUTH DAILY.     amLODipine 10 MG tablet  Commonly known as:  NORVASC  Take 10 mg by mouth daily.     atorvastatin 40 MG tablet  Commonly known as:  LIPITOR  Take 40 mg by mouth daily.     cefUROXime 250 MG tablet  Commonly known as:  CEFTIN  Take 1 tablet (250 mg total) by mouth 2 (two) times daily with a meal.     CENTRUM SILVER ADULT 50+ Tabs  Take 1 tablet by mouth daily.     Cranberry 250 MG Caps  Take 250 mg by mouth daily.     doxycycline 100 MG tablet  Commonly known as:  VIBRA-TABS  Take 1 tablet (100 mg total) by mouth every 12 (twelve) hours.     econazole nitrate 1 % cream  Apply 1 application topically daily as  needed (to groin rash as needed).     esomeprazole 20 MG capsule  Commonly known as:  NEXIUM  Take 20 mg by mouth daily before breakfast.     ferrous sulfate 325 (65 FE) MG tablet  Take 325 mg by mouth daily with breakfast.     fluticasone 50 MCG/ACT nasal spray  Commonly known as:  FLONASE  Place 2 sprays into the nose daily as needed for allergies.     furosemide 80 MG tablet  Commonly known as:  LASIX  Take 80 mg by mouth daily. 1 am     glucose blood test strip  Commonly known as:  ONETOUCH VERIO  Use as instructed to check blood sugar 2 times per day dx code E11.65     HUMALOG KWIKPEN 100 UNIT/ML KiwkPen  Generic drug:  insulin lispro  Inject 7 Units into the skin 2 (two) times daily with a meal.     Insulin Pen Needle 31G X 5 MM Misc  Use 3 pen needles per day     KLOR-CON M10 10 MEQ tablet  Generic drug:  potassium chloride  TAKE 3 TABLETS IN THE MORNING AND 3 TABLET IN THE EVENING     potassium chloride SA 20 MEQ tablet  Commonly known as:  K-DUR,KLOR-CON  Take 1 tablet (20 mEq total) by mouth daily.     metoprolol 50 MG tablet  Commonly known as:  LOPRESSOR  Take 50 mg by mouth 2 (two) times daily.     ONETOUCH DELICA LANCETS FINE Misc  Use to check blood sugar 2 times per day dx code 250.02     pantoprazole 40 MG tablet  Commonly known as:  PROTONIX  Take 40 mg by mouth daily.     polyethylene glycol packet  Commonly known as:  MIRALAX / GLYCOLAX  Take 17 g by mouth daily as needed for mild constipation.     VICTOZA 18 MG/3ML Sopn  Generic drug:  Liraglutide  INJECT 1.2MG  SUBCUTANEOUSLY DAILY     warfarin 5 MG tablet  Commonly known as:  COUMADIN  USE AS DIRECTED       Allergies  Allergen Reactions  . Lotensin [Benazepril Hcl] Anaphylaxis  . Zantac [Ranitidine Hcl] Anaphylaxis  . Tape Itching and Rash    Please use "paper" tape only.   Follow-up Information    Follow up with Milagros Evener, MD. Schedule an appointment as soon as  possible for a visit in 1 week.   Specialty:  Family Medicine   Why:  Hospital follow up   Contact information:   [place] [id number] [phone]        The results of significant diagnostics from this hospitalization (including imaging, microbiology, ancillary and laboratory) are listed below for reference.    Significant Diagnostic Studies: Dg Chest 2 View  12/04/2014   CLINICAL DATA:  Shortness of Breath  EXAM: CHEST  2 VIEW  COMPARISON:  December 03, 2014  FINDINGS: There is patchy left lower lobe infiltrate. There is subsegmental atelectasis in the right base. The lungs elsewhere clear. Heart is mildly enlarged with pulmonary vascularity within normal limits. No adenopathy. No bone lesions. There are surgical clips in the lateral right breast region.  IMPRESSION: Patchy infiltrate left lower lobe. Slight atelectasis right base. Lungs elsewhere clear. No change in cardiac silhouette.   Electronically Signed   By: Lowella Grip III M.D.   On: 12/04/2014 14:46   Dg Ribs Unilateral W/chest Right  12/03/2014   CLINICAL DATA:  Fall. Injury to right lower rib cage. Pain with breathing.  EXAM: RIGHT RIBS AND CHEST - 3+ VIEW  COMPARISON:  08/25/2013  FINDINGS: Low lung volumes. Cardiac enlargement is identified. There is aortic atherosclerosis. Pulmonary vascular congestion identified. No airspace opacity. Fractures involving the anterior aspect of the an seventh, eighth and tenth ribs.  IMPRESSION: 1. Acute anterior right rib fractures.   Electronically Signed   By: Kerby Moors M.D.   On: 12/03/2014 15:50   Ct Head Wo Contrast  12/03/2014   CLINICAL DATA:  Head injury after fall at home. No loss of consciousness.  EXAM: CT HEAD WITHOUT CONTRAST  CT CERVICAL SPINE WITHOUT CONTRAST  TECHNIQUE: Multidetector CT imaging of the head and cervical spine was performed following the standard protocol without intravenous contrast. Multiplanar CT image reconstructions  of the cervical spine were also generated.  COMPARISON:  None.  FINDINGS: CT HEAD FINDINGS  Bony calvarium appears intact. Mild diffuse cortical atrophy is noted. Mild chronic ischemic white matter disease is noted. No mass effect or midline shift is noted. Ventricular size is within normal limits. There is no evidence of mass lesion, hemorrhage or acute infarction.  CT CERVICAL SPINE FINDINGS  No fracture is noted. Grade 1 anterolisthesis of C2-3 is noted. Severe degenerative disc disease is noted at C3-4, C4-5 and C5-6 with anterior and posterior osteophyte formation. Moderate degenerative disc disease is noted at C6-7. Visualized lung apices appear normal.  IMPRESSION: Mild diffuse cortical atrophy. Mild chronic ischemic white matter disease. No acute intracranial abnormality seen.  Severe multilevel degenerative disc disease is noted in the cervical spine. No fracture or other acute abnormality is noted.   Electronically Signed   By: Marijo Conception, M.D.   On: 12/03/2014 16:29   Ct Cervical Spine Wo Contrast  12/03/2014   CLINICAL DATA:  Head injury after fall at home. No loss of consciousness.  EXAM: CT HEAD WITHOUT CONTRAST  CT CERVICAL SPINE WITHOUT CONTRAST  TECHNIQUE: Multidetector CT imaging of the head and cervical spine was performed following the standard protocol without intravenous contrast. Multiplanar CT image reconstructions of the cervical spine were also generated.  COMPARISON:  None.  FINDINGS: CT HEAD FINDINGS  Bony calvarium appears intact. Mild diffuse cortical atrophy is noted. Mild chronic ischemic white matter disease is noted. No mass effect or midline shift is noted. Ventricular size is within normal limits. There is no evidence of mass lesion, hemorrhage or acute infarction.  CT CERVICAL SPINE FINDINGS  No fracture is noted. Grade 1 anterolisthesis of C2-3 is noted. Severe degenerative disc disease is noted at C3-4, C4-5 and C5-6 with anterior and posterior osteophyte formation.  Moderate degenerative disc disease is noted at C6-7. Visualized lung apices appear normal.  IMPRESSION: Mild diffuse cortical atrophy. Mild chronic ischemic white matter disease. No acute intracranial abnormality seen.  Severe multilevel degenerative disc disease is noted in the cervical spine. No fracture or other  acute abnormality is noted.   Electronically Signed   By: Marijo Conception, M.D.   On: 12/03/2014 16:29    Microbiology: No results found for this or any previous visit (from the past 240 hour(s)).   Labs: Basic Metabolic Panel:  Recent Labs Lab 12/04/14 0448 12/06/14 0540 12/07/14 0540 12/08/14 0825 12/08/14 1501  NA 141 140 144 139 138  K 3.1* 2.7* 2.9* 3.0* 3.3*  CL 104 100* 105 104 103  CO2 28 31 29 27 26   GLUCOSE 212* 239* 215* 173* 213*  BUN 60* 45* 39* 36* 37*  CREATININE 2.19* 2.01* 1.74* 1.54* 1.65*  CALCIUM 8.8* 9.0 9.1 8.7* 8.6*  MG  --  1.6* 1.6* 1.8  --    Liver Function Tests:  Recent Labs Lab 12/04/14 0448  AST 21  ALT 17  ALKPHOS 34*  BILITOT 1.0  PROT 6.2*  ALBUMIN 2.8*   No results for input(s): LIPASE, AMYLASE in the last 168 hours. No results for input(s): AMMONIA in the last 168 hours. CBC:  Recent Labs Lab 12/03/14 1443 12/04/14 0448 12/06/14 0540 12/07/14 0540 12/08/14 0825  WBC 11.0* 10.7* 13.2* 12.9* 11.8*  HGB 13.0 11.4* 12.3 11.9* 11.5*  HCT 40.1 36.6 38.8 38.0 36.5  MCV 81.5 81.5 82.2 81.9 81.3  PLT 242 228 222 253 254   Cardiac Enzymes: No results for input(s): CKTOTAL, CKMB, CKMBINDEX, TROPONINI in the last 168 hours. BNP: BNP (last 3 results) No results for input(s): BNP in the last 8760 hours.  ProBNP (last 3 results) No results for input(s): PROBNP in the last 8760 hours.  CBG:  Recent Labs Lab 12/07/14 1226 12/07/14 1704 12/07/14 2137 12/08/14 0825 12/08/14 1157  GLUCAP 231* 183* 226* 170* 230*    Signed:  Radiance Deady K  [facility] 12/08/2014, 4:02 PM

## 2014-12-08 NOTE — Care Management Note (Signed)
Case Management Note  Patient Details  Name: [name] MRN: [id number] Date of Birth: Jun 22, 1933  Subjective/Objective:    Patient is for dc to SNF today, [facility] [place] following.                Action/Plan:   Expected Discharge Date:  12/06/14               Expected Discharge Plan:  Skilled Nursing Facility  In-House Referral:  Clinical Social Work  Discharge planning Services  CM Consult  Post Acute Care Choice:    Choice offered to:     DME Arranged:    DME Agency:     HH Arranged:    [place] Agency:     Status of Service:  Completed, signed off  Medicare Important Message Given:    Date Medicare IM Given:    Medicare IM give by:    Date Additional Medicare IM Given:    Additional Medicare Important Message give by:     If discussed at [place] of Stay Meetings, dates discussed:    Additional Comments:  Zenon Mayo, RN 12/08/2014, 10:58 AM

## 2014-12-11 ENCOUNTER — Encounter: Payer: Self-pay | Admitting: Adult Health

## 2014-12-11 ENCOUNTER — Non-Acute Institutional Stay (SKILLED_NURSING_FACILITY): Payer: Medicare Other | Admitting: Adult Health

## 2014-12-11 DIAGNOSIS — E785 Hyperlipidemia, unspecified: Secondary | ICD-10-CM

## 2014-12-11 DIAGNOSIS — I10 Essential (primary) hypertension: Secondary | ICD-10-CM | POA: Diagnosis not present

## 2014-12-11 DIAGNOSIS — N183 Chronic kidney disease, stage 3 unspecified: Secondary | ICD-10-CM

## 2014-12-11 DIAGNOSIS — D500 Iron deficiency anemia secondary to blood loss (chronic): Secondary | ICD-10-CM

## 2014-12-11 DIAGNOSIS — I482 Chronic atrial fibrillation, unspecified: Secondary | ICD-10-CM

## 2014-12-11 DIAGNOSIS — E43 Unspecified severe protein-calorie malnutrition: Secondary | ICD-10-CM

## 2014-12-11 DIAGNOSIS — K219 Gastro-esophageal reflux disease without esophagitis: Secondary | ICD-10-CM | POA: Diagnosis not present

## 2014-12-11 DIAGNOSIS — R52 Pain, unspecified: Secondary | ICD-10-CM | POA: Diagnosis not present

## 2014-12-11 DIAGNOSIS — K5900 Constipation, unspecified: Secondary | ICD-10-CM

## 2014-12-11 DIAGNOSIS — I5032 Chronic diastolic (congestive) heart failure: Secondary | ICD-10-CM | POA: Diagnosis not present

## 2014-12-11 DIAGNOSIS — E1121 Type 2 diabetes mellitus with diabetic nephropathy: Secondary | ICD-10-CM | POA: Diagnosis not present

## 2014-12-11 DIAGNOSIS — L03116 Cellulitis of left lower limb: Secondary | ICD-10-CM

## 2014-12-11 DIAGNOSIS — Z794 Long term (current) use of insulin: Secondary | ICD-10-CM

## 2014-12-11 DIAGNOSIS — J189 Pneumonia, unspecified organism: Secondary | ICD-10-CM | POA: Diagnosis not present

## 2014-12-11 DIAGNOSIS — E876 Hypokalemia: Secondary | ICD-10-CM | POA: Diagnosis not present

## 2014-12-13 ENCOUNTER — Non-Acute Institutional Stay (SKILLED_NURSING_FACILITY): Payer: Medicare Other | Admitting: Internal Medicine

## 2014-12-13 DIAGNOSIS — L03116 Cellulitis of left lower limb: Secondary | ICD-10-CM

## 2014-12-13 DIAGNOSIS — I739 Peripheral vascular disease, unspecified: Secondary | ICD-10-CM | POA: Diagnosis not present

## 2014-12-13 DIAGNOSIS — S2231XA Fracture of one rib, right side, initial encounter for closed fracture: Secondary | ICD-10-CM

## 2014-12-13 DIAGNOSIS — D638 Anemia in other chronic diseases classified elsewhere: Secondary | ICD-10-CM | POA: Diagnosis not present

## 2014-12-13 DIAGNOSIS — N184 Chronic kidney disease, stage 4 (severe): Secondary | ICD-10-CM | POA: Diagnosis not present

## 2014-12-13 DIAGNOSIS — R5381 Other malaise: Secondary | ICD-10-CM | POA: Diagnosis not present

## 2014-12-13 DIAGNOSIS — I5032 Chronic diastolic (congestive) heart failure: Secondary | ICD-10-CM | POA: Diagnosis not present

## 2014-12-13 DIAGNOSIS — K219 Gastro-esophageal reflux disease without esophagitis: Secondary | ICD-10-CM

## 2014-12-13 DIAGNOSIS — I480 Paroxysmal atrial fibrillation: Secondary | ICD-10-CM

## 2014-12-13 DIAGNOSIS — R791 Abnormal coagulation profile: Secondary | ICD-10-CM | POA: Diagnosis not present

## 2014-12-13 DIAGNOSIS — J189 Pneumonia, unspecified organism: Secondary | ICD-10-CM

## 2014-12-13 DIAGNOSIS — I10 Essential (primary) hypertension: Secondary | ICD-10-CM

## 2014-12-13 DIAGNOSIS — R0989 Other specified symptoms and signs involving the circulatory and respiratory systems: Secondary | ICD-10-CM

## 2014-12-13 DIAGNOSIS — D72829 Elevated white blood cell count, unspecified: Secondary | ICD-10-CM

## 2014-12-13 DIAGNOSIS — E876 Hypokalemia: Secondary | ICD-10-CM | POA: Diagnosis not present

## 2014-12-13 DIAGNOSIS — E0921 Drug or chemical induced diabetes mellitus with diabetic nephropathy: Secondary | ICD-10-CM

## 2014-12-13 NOTE — Progress Notes (Signed)
Patient ID: [name], female   DOB: Jan 03, 1934, 81 y.o.   MRN: [id number]     [facility]   PCP: Milagros Evener, MD  Code Status: full code  Allergies  Allergen Reactions  . Lotensin [Benazepril Hcl] Anaphylaxis  . Zantac [Ranitidine Hcl] Anaphylaxis  . Tape Itching and Rash    Please use "paper" tape only.    Chief Complaint  Patient presents with  . New Admit To SNF     HPI:  81 y.o. patient is here for short term rehabilitation post hospital admission from 12/03/14-12/08/14 post fall with pain from anterior rib fracture, sepsis from community acquired pneumonia and acute on chronic renal disease. She was also treated for left lower extremity cellulitis. She is seen in her room today with her husband at bedside. She is in pain and tylenol itself is not helping her. Her breathing is stable. Has minimal cough with clear phlegm. As per husband her leg redness and swelling has improved.   Review of Systems:  Constitutional: Negative for fever, chills, diaphoresis.  HENT: Negative for headache, congestion, nasal discharge.   Eyes: Negative for eye pain, blurred vision, double vision and discharge.  Respiratory: Negative for cough, shortness of breath and wheezing.   Cardiovascular: Negative for chest pain, palpitations. Gastrointestinal: Negative for heartburn, nausea, vomiting, abdominal pain. Has constipation and miralax is helping Genitourinary: Negative for dysuria and flank pain.  Musculoskeletal: Negative for back pain, falls. Skin: Negative for itching, rash.  Neurological: Negative for dizziness, tingling, focal weakness Psychiatric/Behavioral: Negative for depression    Past Medical History  Diagnosis Date  . Hypertension   . Diabetes mellitus without complication   . Irregular heart beat   . Renal disorder   . Ileus, postoperative 12/17/2012   Past Surgical History  Procedure Laterality Date  . Hernia repair    . Right  lumpectomy    . Cataract extraction, bilateral    . Ventral hernia repair N/A 12/12/2012    Procedure: LAPAROSCOPIC VENTRAL HERNIA;  Surgeon: Madilyn Hook, DO;  Location: [facility];  Service: General;  Laterality: N/A;  . Insertion of mesh N/A 12/12/2012    Procedure: INSERTION OF MESH;  Surgeon: Madilyn Hook, DO;  Location: [facility];  Service: General;  Laterality: N/A;  . Laparoscopic lysis of adhesions N/A 12/12/2012    Procedure: LAPAROSCOPIC LYSIS OF ADHESIONS;  Surgeon: Madilyn Hook, DO;  Location: [facility];  Service: General;  Laterality: N/A;   Social History:   reports that she has quit smoking. Her smoking use included Cigarettes. She has never used smokeless tobacco. She reports that she does not drink alcohol or use illicit drugs.  Family History  Problem Relation Age of Onset  . Diabetes Mother   . Diabetes Sister     Medications:   Medication List       This list is accurate as of: 12/13/14  4:30 PM.  Always use your most recent med list.               amiodarone 200 MG tablet  Commonly known as:  PACERONE  TAKE 1 TABLET (200 MG TOTAL) BY MOUTH DAILY.     amLODipine 10 MG tablet  Commonly known as:  NORVASC  Take 10 mg by mouth daily.     atorvastatin 40 MG tablet  Commonly known as:  LIPITOR  Take 40 mg by mouth daily.     cefUROXime 250 MG tablet  Commonly known as:  CEFTIN  Take 1 tablet (250 mg total) by mouth 2 (two) times daily with a meal.     CENTRUM SILVER ADULT 50+ Tabs  Take 1 tablet by mouth daily.     Cranberry 250 MG Caps  Take 250 mg by mouth daily.     doxycycline 100 MG tablet  Commonly known as:  VIBRA-TABS  Take 1 tablet (100 mg total) by mouth every 12 (twelve) hours.     econazole nitrate 1 % cream  Apply 1 application topically daily as needed (to groin rash as needed).     esomeprazole 20 MG capsule  Commonly known as:  NEXIUM  Take 20 mg by mouth daily before breakfast.     ferrous sulfate 325 (65 FE) MG tablet  Take 325 mg by  mouth daily with breakfast.     fluticasone 50 MCG/ACT nasal spray  Commonly known as:  FLONASE  Place 2 sprays into the nose daily as needed for allergies.     furosemide 80 MG tablet  Commonly known as:  LASIX  Take 80 mg by mouth daily. 1 am     glucose blood test strip  Commonly known as:  ONETOUCH VERIO  Use as instructed to check blood sugar 2 times per day dx code E11.65     HUMALOG KWIKPEN 100 UNIT/ML KiwkPen  Generic drug:  insulin lispro  Inject 7 Units into the skin 2 (two) times daily with a meal.     Insulin Pen Needle 31G X 5 MM Misc  Use 3 pen needles per day     KLOR-CON M10 10 MEQ tablet  Generic drug:  potassium chloride  TAKE 3 TABLETS IN THE MORNING AND 3 TABLET IN THE EVENING     potassium chloride SA 20 MEQ tablet  Commonly known as:  K-DUR,KLOR-CON  Take 1 tablet (20 mEq total) by mouth daily.     metoprolol 50 MG tablet  Commonly known as:  LOPRESSOR  Take 50 mg by mouth 2 (two) times daily.     ONETOUCH DELICA LANCETS FINE Misc  Use to check blood sugar 2 times per day dx code 250.02     pantoprazole 40 MG tablet  Commonly known as:  PROTONIX  Take 40 mg by mouth daily.     polyethylene glycol packet  Commonly known as:  MIRALAX / GLYCOLAX  Take 17 g by mouth daily as needed for mild constipation.     VICTOZA 18 MG/3ML Sopn  Generic drug:  Liraglutide  INJECT 1.2MG  SUBCUTANEOUSLY DAILY     warfarin 5 MG tablet  Commonly known as:  COUMADIN  USE AS DIRECTED         Physical Exam: Filed Vitals:   12/13/14 1630  BP: 117/63  Pulse: 63  Temp: 98.5 F (36.9 C)  Resp: 16  Weight: 205 lb 3.2 oz (93.078 kg)  SpO2: 97%    General- elderly female, obese, in no acute distress Head- normocephalic, atraumatic Nose- normal nasal mucosa, no maxillary or frontal sinus tenderness, no nasal discharge Throat- moist mucus membrane Eyes- PERRLA, EOMI, no pallor, no icterus, no discharge, normal conjunctiva, normal sclera Neck- no cervical  lymphadenopathy Cardiovascular- normal s1,s2, no murmurs, 1+ leg edema mainly around ankle   Respiratory- bilateral clear to auscultation, no wheeze, no rhonchi, no crackles, no use of accessory muscles Abdomen- bowel sounds present, soft, non tender Musculoskeletal- able to move all 4 extremities, generalized weakness, on wheelchair and uses walker  Neurological- no focal deficit, alert and oriented  to person, place and time Skin- warm and dry, erythema to right medial ankle area and erythema on the left leg with stasis changes also on left leg Psychiatry- normal mood and affect   Labs reviewed: Basic Metabolic Panel:  Recent Labs  12/06/14 0540 12/07/14 0540 12/08/14 0825 12/08/14 1501  NA 140 144 139 138  K 2.7* 2.9* 3.0* 3.3*  CL 100* 105 104 103  CO2 31 29 27 26   GLUCOSE 239* 215* 173* 213*  BUN 45* 39* 36* 37*  CREATININE 2.01* 1.74* 1.54* 1.65*  CALCIUM 9.0 9.1 8.7* 8.6*  MG 1.6* 1.6* 1.8  --    Liver Function Tests:  Recent Labs  07/27/14 0934 11/23/14 0900 12/04/14 0448  AST 38* 32 21  ALT 35 23 17  ALKPHOS 73 50 34*  BILITOT 0.5 0.6 1.0  PROT 6.9 7.5 6.2*  ALBUMIN 3.4* 3.8 2.8*   No results for input(s): LIPASE, AMYLASE in the last 8760 hours. No results for input(s): AMMONIA in the last 8760 hours. CBC:  Recent Labs  12/06/14 0540 12/07/14 0540 12/08/14 0825  WBC 13.2* 12.9* 11.8*  HGB 12.3 11.9* 11.5*  HCT 38.8 38.0 36.5  MCV 82.2 81.9 81.3  PLT 222 253 254   Cardiac Enzymes: No results for input(s): CKTOTAL, CKMB, CKMBINDEX, TROPONINI in the last 8760 hours. BNP: Invalid input(s): POCBNP CBG:  Recent Labs  12/08/14 0825 12/08/14 1157 12/08/14 1655  GLUCAP 170* 230* 179*    Radiological Exams: Dg Chest 2 View  12/04/2014   CLINICAL DATA:  Shortness of Breath  EXAM: CHEST  2 VIEW  COMPARISON:  December 03, 2014  FINDINGS: There is patchy left lower lobe infiltrate. There is subsegmental atelectasis in the right base. The lungs  elsewhere clear. Heart is mildly enlarged with pulmonary vascularity within normal limits. No adenopathy. No bone lesions. There are surgical clips in the lateral right breast region.  IMPRESSION: Patchy infiltrate left lower lobe. Slight atelectasis right base. Lungs elsewhere clear. No change in cardiac silhouette.   Electronically Signed   By: Lowella Grip III M.D.   On: 12/04/2014 14:46   Dg Ribs Unilateral W/chest Right  12/03/2014   CLINICAL DATA:  Fall. Injury to right lower rib cage. Pain with breathing.  EXAM: RIGHT RIBS AND CHEST - 3+ VIEW  COMPARISON:  08/25/2013  FINDINGS: Low lung volumes. Cardiac enlargement is identified. There is aortic atherosclerosis. Pulmonary vascular congestion identified. No airspace opacity. Fractures involving the anterior aspect of the an seventh, eighth and tenth ribs.  IMPRESSION: 1. Acute anterior right rib fractures.   Electronically Signed   By: Kerby Moors M.D.   On: 12/03/2014 15:50   Ct Head Wo Contrast  12/03/2014   CLINICAL DATA:  Head injury after fall at home. No loss of consciousness.  EXAM: CT HEAD WITHOUT CONTRAST  CT CERVICAL SPINE WITHOUT CONTRAST  TECHNIQUE: Multidetector CT imaging of the head and cervical spine was performed following the standard protocol without intravenous contrast. Multiplanar CT image reconstructions of the cervical spine were also generated.  COMPARISON:  None.  FINDINGS: CT HEAD FINDINGS  Bony calvarium appears intact. Mild diffuse cortical atrophy is noted. Mild chronic ischemic white matter disease is noted. No mass effect or midline shift is noted. Ventricular size is within normal limits. There is no evidence of mass lesion, hemorrhage or acute infarction.  CT CERVICAL SPINE FINDINGS  No fracture is noted. Grade 1 anterolisthesis of C2-3 is noted. Severe degenerative disc disease is noted at  C3-4, C4-5 and C5-6 with anterior and posterior osteophyte formation. Moderate degenerative disc disease is noted at C6-7.  Visualized lung apices appear normal.  IMPRESSION: Mild diffuse cortical atrophy. Mild chronic ischemic white matter disease. No acute intracranial abnormality seen.  Severe multilevel degenerative disc disease is noted in the cervical spine. No fracture or other acute abnormality is noted.   Electronically Signed   By: Marijo Conception, M.D.   On: 12/03/2014 16:29   Ct Cervical Spine Wo Contrast  12/03/2014   CLINICAL DATA:  Head injury after fall at home. No loss of consciousness.  EXAM: CT HEAD WITHOUT CONTRAST  CT CERVICAL SPINE WITHOUT CONTRAST  TECHNIQUE: Multidetector CT imaging of the head and cervical spine was performed following the standard protocol without intravenous contrast. Multiplanar CT image reconstructions of the cervical spine were also generated.  COMPARISON:  None.  FINDINGS: CT HEAD FINDINGS  Bony calvarium appears intact. Mild diffuse cortical atrophy is noted. Mild chronic ischemic white matter disease is noted. No mass effect or midline shift is noted. Ventricular size is within normal limits. There is no evidence of mass lesion, hemorrhage or acute infarction.  CT CERVICAL SPINE FINDINGS  No fracture is noted. Grade 1 anterolisthesis of C2-3 is noted. Severe degenerative disc disease is noted at C3-4, C4-5 and C5-6 with anterior and posterior osteophyte formation. Moderate degenerative disc disease is noted at C6-7. Visualized lung apices appear normal.  IMPRESSION: Mild diffuse cortical atrophy. Mild chronic ischemic white matter disease. No acute intracranial abnormality seen.  Severe multilevel degenerative disc disease is noted in the cervical spine. No fracture or other acute abnormality is noted.   Electronically Signed   By: Marijo Conception, M.D.   On: 12/03/2014 16:29     Assessment/Plan  Physical deconditioning Will have her work with physical therapy and occupational therapy team to help with gait training and muscle strengthening exercises.fall precautions. Skin care.  Encourage to be out of bed.   Anterior rib fracture Start tramadol 50 mg bid and tylenol 650 mg q6h prn pain and reassess  CAP Currently on ceftin 250 mg bid until 12/14/14, breathing stable and clinically improved, monitor  LLE cellulitis Currently on doxycycline 100 mg bid until 12/14/14, clinically improved per husband, monitor cbc with diff  supratherapeutic inr inr today 3.8. Her being on antibiotic likely contributing some.No signs of bleed. Hold coumadin for now and check inr 12/14/14  Anemia of chronic disease continue feso4 325 mg daily and MVI, monitor h&h  ckd stage 4 Monitor bmp, s/p iv fluids in hospital  Leukocytosis From her cellulitis and CAP, currently on antibiotics until tomorrow. Check cbc with diff   afib Rate controlled. Continue amiodarone 200 mg daily and lopressor 50 mg bid with coumadin with goal inr 2-3  Poor distal pulses With stasis changes to the legs. Get ABI to assess for arterial insufficiency given her rest pain  HTN Stable bp, continue amlodipine 10 mg daily, lopressor 50 mg bid and monitor bp  chf Continue lasix 80 mg daily, metoprolol 50 mg bid and kcl supplement,monitor bmp and weight  Hypokalemia Currently on kcl 30 meq am and 30 meq pm, monitor bmp and d/c 20 meq daily order  gerd Continue nexium 20 mg daily and monitor. D/c protonix for now  Constipation Continue miralax daily for now and monitor  DM Monitor cbg, continue victoza and lipitor  Goals of care: short term rehabilitation   Labs/tests ordered: cbc, cmp next lab  Family/ staff Communication:  reviewed care plan with patient and nursing supervisor    Blanchie Serve, MD  [facility] [phone] (Monday-Friday 8 am - 5 pm) [phone] (afterhours)

## 2014-12-14 LAB — ABI

## 2014-12-15 ENCOUNTER — Non-Acute Institutional Stay (SKILLED_NURSING_FACILITY): Payer: Medicare Other | Admitting: Adult Health

## 2014-12-15 ENCOUNTER — Encounter: Payer: Self-pay | Admitting: Adult Health

## 2014-12-15 DIAGNOSIS — Z7901 Long term (current) use of anticoagulants: Secondary | ICD-10-CM | POA: Diagnosis not present

## 2014-12-15 DIAGNOSIS — I482 Chronic atrial fibrillation, unspecified: Secondary | ICD-10-CM

## 2014-12-17 NOTE — Progress Notes (Signed)
Patient ID: [name], female   DOB: [birth year], 81 y.o.   MRN: [id number] Subjective:     Indication: atrial fibrillation Bleeding signs/symptoms: None Thromboembolic signs/symptoms: None  Missed Coumadin doses: 3 due to being supratherapeutic Medication changes: no Dietary changes: no  Bacterial/viral infection: yes - Cellulitis Other concerns: no    Review of Systems A comprehensive review of systems was negative.   Objective:    INR Today: 2.6 Current dose:  5 mg; held X 3 days  Assessment:    Therapeutic INR for goal of 2-3   Plan:    1. New dose: decrease Coumadin to 4 mg PO daily   2. Next INR:  12/19/14

## 2014-12-27 NOTE — Progress Notes (Signed)
Patient ID: [name], female   DOB: Aug 07, 1933, 81 y.o.   MRN: [id number]    DATE:  12/11/14 MRN:  [id number]  BIRTHDAY: 10/01/1933  Facility:  Nursing Home Location:  [place] Room Number: 1202-P  LEVEL OF CARE:  SNF [phone])  Contact Information    Name Relation Home Work Mobile   [place] Spouse [phone]     Bujak,Nicole Daughter [phone]  [phone]   Clark,Antionette Sister [phone]  [phone]       Chief Complaint  Patient presents with  . Hospitalization Follow-up    Intractable pain secondary to anterior rib fracture, chronic kidney disease stage III, community-acquired pneumonia, hypertension, atrial fibrillation, hypokalemia, constipation, cellulitis, diabetes mellitus, protein calorie malnutrition, anemia, CHF, hyperlipidemia and GERD    HISTORY OF PRESENT ILLNESS:  This is a 81 year old female who has been admitted to [facility] on 12/08/14 from [facility]. She has PMH of hypertension, diabetes mellitus, atrial fibrillation, GERD and diastolic CHF. She had a fall in which she hit her right side lower chest wall and her head. She has been using Tylenol for her chest wall pain but pain got intractable so she came to ED. She was found to have acute anterior right rib fracture and pneumonia.  She has been admitted for a short-term rehabilitation.  PAST MEDICAL HISTORY:  Past Medical History  Diagnosis Date  . Hypertension   . Diabetes mellitus without complication   . Irregular heart beat   . Renal disorder   . Ileus, postoperative 12/17/2012     CURRENT MEDICATIONS: Reviewed  Patient's Medications  New Prescriptions   No medications on file  Previous Medications   AMIODARONE (PACERONE) 200 MG TABLET    TAKE 1 TABLET (200 MG TOTAL) BY MOUTH DAILY.   AMLODIPINE (NORVASC) 10 MG TABLET    Take 10 mg by mouth daily.    ATORVASTATIN (LIPITOR) 40 MG TABLET    Take 40 mg by mouth daily.   CEFUROXIME  (CEFTIN) 250 MG TABLET    Take 1 tablet (250 mg total) by mouth 2 (two) times daily with a meal.   CRANBERRY 250 MG CAPS    Take 250 mg by mouth daily.   DOXYCYCLINE (VIBRA-TABS) 100 MG TABLET    Take 1 tablet (100 mg total) by mouth every 12 (twelve) hours.   ECONAZOLE NITRATE 1 % CREAM    Apply 1 application topically daily as needed (to groin rash as needed).   ESOMEPRAZOLE (NEXIUM) 20 MG CAPSULE    Take 20 mg by mouth daily before breakfast.    FERROUS SULFATE 325 (65 FE) MG TABLET    Take 325 mg by mouth daily with breakfast.   FLUTICASONE (FLONASE) 50 MCG/ACT NASAL SPRAY    Place 2 sprays into the nose daily as needed for allergies.   FUROSEMIDE (LASIX) 80 MG TABLET    Take 80 mg by mouth daily. 1 am   GLUCOSE BLOOD (ONETOUCH VERIO) TEST STRIP    Use as instructed to check blood sugar 2 times per day dx code E11.65   HUMALOG KWIKPEN 100 UNIT/ML KIWKPEN    Inject 7 Units into the skin 2 (two) times daily with a meal.   INSULIN PEN NEEDLE 31G X 5 MM MISC    Use 3 pen needles per day   KLOR-CON M10 10 MEQ TABLET    TAKE 3 TABLETS IN THE MORNING AND 3 TABLET IN THE EVENING   METOPROLOL (LOPRESSOR) 50  MG TABLET    Take 50 mg by mouth 2 (two) times daily.   MULTIPLE VITAMINS-MINERALS (CENTRUM SILVER ADULT 50+) TABS    Take 1 tablet by mouth daily.    ONETOUCH DELICA LANCETS FINE MISC    Use to check blood sugar 2 times per day dx code 250.02   PANTOPRAZOLE (PROTONIX) 40 MG TABLET    Take 40 mg by mouth daily.   POLYETHYLENE GLYCOL (MIRALAX / GLYCOLAX) PACKET    Take 17 g by mouth daily as needed for mild constipation.   POTASSIUM CHLORIDE SA (K-DUR,KLOR-CON) 20 MEQ TABLET    Take 1 tablet (20 mEq total) by mouth daily.   VICTOZA 18 MG/3ML SOPN    INJECT 1.2MG SUBCUTANEOUSLY DAILY   WARFARIN (COUMADIN) 5 MG TABLET    USE AS DIRECTED  Modified Medications   No medications on file  Discontinued Medications   No medications on file     Allergies  Allergen Reactions  . Lotensin [Benazepril  Hcl] Anaphylaxis  . Zantac [Ranitidine Hcl] Anaphylaxis  . Tape Itching and Rash    Please use "paper" tape only.     REVIEW OF SYSTEMS:  GENERAL: no change in appetite, no fatigue, no weight changes, no fever, chills or weakness EYES: Denies change in vision, dry eyes, eye pain, itching or discharge EARS: Denies change in hearing, ringing in ears, or earache NOSE: Denies nasal congestion or epistaxis MOUTH and THROAT: Denies oral discomfort, gingival pain or bleeding, pain from teeth or hoarseness   RESPIRATORY: no cough, SOB, DOE, wheezing, hemoptysis CARDIAC: no chest pain, edema or palpitations GI: no abdominal pain, diarrhea, constipation, heart burn, nausea or vomiting GU: Denies dysuria, frequency, hematuria, incontinence, or discharge PSYCHIATRIC: Denies feeling of depression or anxiety. No report of hallucinations, insomnia, paranoia, or agitation   PHYSICAL EXAMINATION  GENERAL APPEARANCE: Well nourished. In no acute distress. Obese SKIN:  LLE is erythematous HEAD: Normal in size and contour. No evidence of trauma EYES: Lids open and close normally. No blepharitis, entropion or ectropion. PERRL. Conjunctivae are clear and sclerae are white. Lenses are without opacity EARS: Pinnae are normal. Patient hears normal voice tunes of the examiner MOUTH and THROAT: Lips are without lesions. Oral mucosa is moist and without lesions. Tongue is normal in shape, size, and color and without lesions NECK: supple, trachea midline, no neck masses, no thyroid tenderness, no thyromegaly LYMPHATICS: no LAN in the neck, no supraclavicular LAN RESPIRATORY: breathing is even & unlabored, BS CTAB CARDIAC: RRR, no murmur,no extra heart sounds, BLE edema 2+ GI: abdomen soft, normal BS, no masses, no tenderness, no hepatomegaly, no splenomegaly EXTREMITIES:  Able to move 4 extremities PSYCHIATRIC: Alert and oriented X 3. Affect and behavior are appropriate   LABS/RADIOLOGY: Labs  reviewed: Basic Metabolic Panel:  Recent Labs  12/06/14 0540 12/07/14 0540 12/08/14 0825 12/08/14 1501  NA 140 144 139 138  K 2.7* 2.9* 3.0* 3.3*  CL 100* 105 104 103  CO2 _0 GLUCOSE 239* 215* 173* 213*  BUN 45* 39* 36* 37*  CREATININE 2.01* 1.74* 1.54* 1.65*  CALCIUM 9.0 9.1 8.7* 8.6*  MG 1.6* 1.6* 1.8  --    Liver Function Tests:  Recent Labs  07/27/14 0934 11/23/14 0900 12/04/14 0448  AST 38* 32 21  ALT 35 23 17  ALKPHOS 73 50 34*  BILITOT 0.5 0.6 1.0  PROT 6.9 7.5 6.2*  ALBUMIN 3.4* 3.8 2.8*   CBC:  Recent Labs  12/06/14 0540 12/07/14  0540 12/08/14 0825  WBC 13.2* 12.9* 11.8*  HGB 12.3 11.9* 11.5*  HCT 38.8 38.0 36.5  MCV 82.2 81.9 81.3  PLT 222 253 254   A1C: Invalid input(s): A1C Lipid Panel:  Recent Labs  01/26/14 1418 10/27/14 0944  HDL 46.70 51.60   CBG:  Recent Labs  12/08/14 0825 12/08/14 1157 12/08/14 1655  GLUCAP 170* 230* 179*    Dg Chest 2 View  12/04/2014   CLINICAL DATA:  Shortness of Breath  EXAM: CHEST  2 VIEW  COMPARISON:  December 03, 2014  FINDINGS: There is patchy left lower lobe infiltrate. There is subsegmental atelectasis in the right base. The lungs elsewhere clear. Heart is mildly enlarged with pulmonary vascularity within normal limits. No adenopathy. No bone lesions. There are surgical clips in the lateral right breast region.  IMPRESSION: Patchy infiltrate left lower lobe. Slight atelectasis right base. Lungs elsewhere clear. No change in cardiac silhouette.   Electronically Signed   By: Lowella Grip III M.D.   On: 12/04/2014 14:46   Dg Ribs Unilateral W/chest Right  12/03/2014   CLINICAL DATA:  Fall. Injury to right lower rib cage. Pain with breathing.  EXAM: RIGHT RIBS AND CHEST - 3+ VIEW  COMPARISON:  08/25/2013  FINDINGS: Low lung volumes. Cardiac enlargement is identified. There is aortic atherosclerosis. Pulmonary vascular congestion identified. No airspace opacity. Fractures involving the  anterior aspect of the an seventh, eighth and tenth ribs.  IMPRESSION: 1. Acute anterior right rib fractures.   Electronically Signed   By: Kerby Moors M.D.   On: 12/03/2014 15:50   Ct Head Wo Contrast  12/03/2014   CLINICAL DATA:  Head injury after fall at home. No loss of consciousness.  EXAM: CT HEAD WITHOUT CONTRAST  CT CERVICAL SPINE WITHOUT CONTRAST  TECHNIQUE: Multidetector CT imaging of the head and cervical spine was performed following the standard protocol without intravenous contrast. Multiplanar CT image reconstructions of the cervical spine were also generated.  COMPARISON:  None.  FINDINGS: CT HEAD FINDINGS  Bony calvarium appears intact. Mild diffuse cortical atrophy is noted. Mild chronic ischemic white matter disease is noted. No mass effect or midline shift is noted. Ventricular size is within normal limits. There is no evidence of mass lesion, hemorrhage or acute infarction.  CT CERVICAL SPINE FINDINGS  No fracture is noted. Grade 1 anterolisthesis of C2-3 is noted. Severe degenerative disc disease is noted at C3-4, C4-5 and C5-6 with anterior and posterior osteophyte formation. Moderate degenerative disc disease is noted at C6-7. Visualized lung apices appear normal.  IMPRESSION: Mild diffuse cortical atrophy. Mild chronic ischemic white matter disease. No acute intracranial abnormality seen.  Severe multilevel degenerative disc disease is noted in the cervical spine. No fracture or other acute abnormality is noted.   Electronically Signed   By: Marijo Conception, M.D.   On: 12/03/2014 16:29   Ct Cervical Spine Wo Contrast  12/03/2014   CLINICAL DATA:  Head injury after fall at home. No loss of consciousness.  EXAM: CT HEAD WITHOUT CONTRAST  CT CERVICAL SPINE WITHOUT CONTRAST  TECHNIQUE: Multidetector CT imaging of the head and cervical spine was performed following the standard protocol without intravenous contrast. Multiplanar CT image reconstructions of the cervical spine were also  generated.  COMPARISON:  None.  FINDINGS: CT HEAD FINDINGS  Bony calvarium appears intact. Mild diffuse cortical atrophy is noted. Mild chronic ischemic white matter disease is noted. No mass effect or midline shift is noted. Ventricular size is within  normal limits. There is no evidence of mass lesion, hemorrhage or acute infarction.  CT CERVICAL SPINE FINDINGS  No fracture is noted. Grade 1 anterolisthesis of C2-3 is noted. Severe degenerative disc disease is noted at C3-4, C4-5 and C5-6 with anterior and posterior osteophyte formation. Moderate degenerative disc disease is noted at C6-7. Visualized lung apices appear normal.  IMPRESSION: Mild diffuse cortical atrophy. Mild chronic ischemic white matter disease. No acute intracranial abnormality seen.  Severe multilevel degenerative disc disease is noted in the cervical spine. No fracture or other acute abnormality is noted.   Electronically Signed   By: Marijo Conception, M.D.   On: 12/03/2014 16:29    ASSESSMENT/PLAN:  Intractable pain secondary to anterior rib fracture - encourage incentive spirometer  Chronic kidney disease stage III - creatinine 1.54; check BMP  Community acquired pneumonia - continue Cipro 4 pox I'm 250 mg by mouth twice a day and doxycycline 100 mg by mouth every 12 hours  Hypertension - well controlled; continue amlodipine 10 mg 1 tab by mouth daily and metoprolol 50 mg 1 tab by mouth twice a day  Atrial fibrillation - rate controlled; continue Coumadin, amiodarone 200 mg 1 tab by mouth daily and metoprolol  Constipation - continue MiraLAX when necessary and start senna S2 tabs by mouth twice a day  Cellulitis LLE - continue doxycycline 100 mg 1 tab by mouth every 12 hours  Diabetes mellitus type 2 - hemoglobin A1c 8; continue Humalog 7 units subcutaneous twice a day, Victoza 1.2 mg daily and insulin sliding scale  Protein calorie malnutrition, severe - albumin 2.8; RD consult  Anemia, chronic blood loss - hemoglobin  11.5; continue ferrous sulfate 325 mg 1 tab by mouth daily  Chronic diastolic heart failure - Daily weights; continue Lasix 80 mg 1 tab by mouth daily  Hyperlipidemia - continue atorvastatin 40 mg 1 tab by mouth daily  GERD - continue Protonix 40 mg 1 tab by mouth daily  Hypokalemia - K3.3; continue K Dur 10 TMB every by mouth daily      Goals of care:  Short-term rehabilitation   [facility], NP [employer] [phone]

## 2014-12-28 ENCOUNTER — Non-Acute Institutional Stay (SKILLED_NURSING_FACILITY): Payer: Medicare Other | Admitting: Adult Health

## 2014-12-28 ENCOUNTER — Encounter: Payer: Self-pay | Admitting: Adult Health

## 2014-12-28 DIAGNOSIS — E43 Unspecified severe protein-calorie malnutrition: Secondary | ICD-10-CM | POA: Diagnosis not present

## 2014-12-28 DIAGNOSIS — I482 Chronic atrial fibrillation, unspecified: Secondary | ICD-10-CM

## 2014-12-28 DIAGNOSIS — K5900 Constipation, unspecified: Secondary | ICD-10-CM

## 2014-12-28 DIAGNOSIS — I10 Essential (primary) hypertension: Secondary | ICD-10-CM | POA: Diagnosis not present

## 2014-12-28 DIAGNOSIS — D638 Anemia in other chronic diseases classified elsewhere: Secondary | ICD-10-CM

## 2014-12-28 DIAGNOSIS — R52 Pain, unspecified: Secondary | ICD-10-CM | POA: Diagnosis not present

## 2014-12-28 DIAGNOSIS — N183 Chronic kidney disease, stage 3 unspecified: Secondary | ICD-10-CM

## 2014-12-28 DIAGNOSIS — E785 Hyperlipidemia, unspecified: Secondary | ICD-10-CM | POA: Diagnosis not present

## 2014-12-28 DIAGNOSIS — E1121 Type 2 diabetes mellitus with diabetic nephropathy: Secondary | ICD-10-CM

## 2014-12-28 DIAGNOSIS — R5381 Other malaise: Secondary | ICD-10-CM | POA: Diagnosis not present

## 2014-12-28 DIAGNOSIS — E876 Hypokalemia: Secondary | ICD-10-CM

## 2014-12-28 DIAGNOSIS — K219 Gastro-esophageal reflux disease without esophagitis: Secondary | ICD-10-CM | POA: Diagnosis not present

## 2014-12-28 DIAGNOSIS — I5032 Chronic diastolic (congestive) heart failure: Secondary | ICD-10-CM | POA: Diagnosis not present

## 2014-12-28 DIAGNOSIS — Z794 Long term (current) use of insulin: Secondary | ICD-10-CM

## 2015-01-03 LAB — POCT INR: INR: 2.5

## 2015-01-05 ENCOUNTER — Ambulatory Visit (INDEPENDENT_AMBULATORY_CARE_PROVIDER_SITE_OTHER): Payer: Medicare Other | Admitting: Cardiology

## 2015-01-05 DIAGNOSIS — I482 Chronic atrial fibrillation, unspecified: Secondary | ICD-10-CM

## 2015-01-05 DIAGNOSIS — Z5181 Encounter for therapeutic drug level monitoring: Secondary | ICD-10-CM

## 2015-01-09 ENCOUNTER — Other Ambulatory Visit: Payer: Self-pay | Admitting: Endocrinology

## 2015-01-09 ENCOUNTER — Encounter: Payer: Self-pay | Admitting: *Deleted

## 2015-01-09 LAB — HM DIABETES EYE EXAM

## 2015-01-11 ENCOUNTER — Ambulatory Visit (INDEPENDENT_AMBULATORY_CARE_PROVIDER_SITE_OTHER): Payer: Medicare Other | Admitting: Cardiology

## 2015-01-11 DIAGNOSIS — Z5181 Encounter for therapeutic drug level monitoring: Secondary | ICD-10-CM

## 2015-01-11 DIAGNOSIS — I482 Chronic atrial fibrillation, unspecified: Secondary | ICD-10-CM

## 2015-01-11 LAB — POCT INR: INR: 1.8

## 2015-01-12 ENCOUNTER — Encounter: Payer: Self-pay | Admitting: Vascular Surgery

## 2015-01-16 ENCOUNTER — Encounter: Payer: Self-pay | Admitting: Vascular Surgery

## 2015-01-16 ENCOUNTER — Ambulatory Visit (INDEPENDENT_AMBULATORY_CARE_PROVIDER_SITE_OTHER): Payer: Medicare Other | Admitting: Vascular Surgery

## 2015-01-16 VITALS — BP 141/73 | HR 63 | Temp 97.7°F | Resp 16 | Ht 66.0 in | Wt 207.0 lb

## 2015-01-16 DIAGNOSIS — I872 Venous insufficiency (chronic) (peripheral): Secondary | ICD-10-CM | POA: Diagnosis not present

## 2015-01-16 NOTE — Progress Notes (Signed)
Patient name: [name] MRN: [id number] DOB: [birth year] Sex: female   Referred by: [facility]  Reason for referral:  Chief Complaint  Patient presents with  . New Evaluation    Pt. is a Resident of and Ref. by [facility].  C/O Left leg cellulitis and discoloration 6-8 yrs    HISTORY OF PRESENT ILLNESS: Patient presents today for evaluation of lower extremity arterial and venous pathology. She is a very pleasant alert off 81 year old female who is a resident of [facility]. She is here today with her husband and daughter. She has a long history of swelling and discoloration in her left leg she reports that it becomes more swollen over the course of the day. She has been treated with Unna boot in the past and also with several courses of anabolic for cellulitis. She has no other tissue loss. She denies any specific resting symptoms in her feet. She does have symptoms consistent with neuropathy with numbness and a "funny feeling" in the bottoms of her feet bilaterally. She is on chronic Coumadin therapy for chronic atrial fibrillation. She denies any history of DVT  Past Medical History  Diagnosis Date  . Hypertension   . Diabetes mellitus without complication ([place])   . Irregular heart beat   . Renal disorder   . Ileus, postoperative 12/17/2012  . Fall at home Sept. 3, 2016    Fx  3 ribs    Past Surgical History  Procedure Laterality Date  . Hernia repair    . Right lumpectomy    . Cataract extraction, bilateral    . Ventral hernia repair N/A 12/12/2012    Procedure: LAPAROSCOPIC VENTRAL HERNIA;  Surgeon: Madilyn Hook, DO;  Location: [facility];  Service: General;  Laterality: N/A;  . Insertion of mesh N/A 12/12/2012    Procedure: INSERTION OF MESH;  Surgeon: Madilyn Hook, DO;  Location: [facility];  Service: General;  Laterality: N/A;  . Laparoscopic lysis of adhesions N/A 12/12/2012    Procedure: LAPAROSCOPIC LYSIS OF ADHESIONS;  Surgeon: Madilyn Hook, DO;  Location: [facility];   Service: General;  Laterality: N/A;    Social History   Social History  . Marital Status: Married    Spouse Name: N/A  . Number of Children: N/A  . Years of Education: N/A   Occupational History  . Not on file.   Social History Main Topics  . Smoking status: Former Smoker    Types: Cigarettes  . Smokeless tobacco: Never Used  . Alcohol Use: No  . Drug Use: No  . Sexual Activity: No   Other Topics Concern  . Not on file   Social History Narrative    Family History  Problem Relation Age of Onset  . Diabetes Mother   . Diabetes Sister     Allergies as of 01/16/2015 - Review Complete 01/16/2015  Allergen Reaction Noted  . Lotensin [benazepril hcl] Anaphylaxis 12/09/2012  . Zantac [ranitidine hcl] Anaphylaxis 12/09/2012  . Tape Itching and Rash 12/03/2014    Current Outpatient Prescriptions on File Prior to Visit  Medication Sig Dispense Refill  . amiodarone (PACERONE) 200 MG tablet TAKE 1 TABLET (200 MG TOTAL) BY MOUTH DAILY. 30 tablet 6  . amLODipine (NORVASC) 10 MG tablet Take 10 mg by mouth daily.     Marland Kitchen atorvastatin (LIPITOR) 40 MG tablet Take 40 mg by mouth daily.    . Cranberry 250 MG CAPS Take 250 mg by mouth daily.    Marland Kitchen econazole nitrate  1 % cream Apply 1 application topically daily as needed (to groin rash as needed).    Marland Kitchen esomeprazole (NEXIUM) 20 MG capsule Take 20 mg by mouth daily before breakfast.     . ferrous sulfate 325 (65 FE) MG tablet Take 325 mg by mouth daily with breakfast.    . fluticasone (FLONASE) 50 MCG/ACT nasal spray Place 2 sprays into the nose daily as needed for allergies.    . furosemide (LASIX) 80 MG tablet Take 80 mg by mouth daily. 1 am    . HUMALOG KWIKPEN 100 UNIT/ML KiwkPen Inject 7 Units into the skin 2 (two) times daily with a meal.  3  . Insulin Pen Needle 31G X 5 MM MISC Use 3 pen needles per day 100 each 3  . KLOR-CON M10 10 MEQ tablet TAKE 3 TABLETS IN THE MORNING AND 3 TABLET IN THE EVENING 180 tablet 3  . metoprolol  (LOPRESSOR) 50 MG tablet Take 50 mg by mouth 2 (two) times daily.    . Multiple Vitamins-Minerals (CENTRUM SILVER ADULT 50+) TABS Take 1 tablet by mouth daily.     Glory Rosebush DELICA LANCETS FINE MISC Use to check blood sugar 2 times per day dx code 250.02 100 each 1  . ONETOUCH VERIO test strip USE ONE STRIP TO CHECK GLUCOSE TWICE DAILY AS DIRECTED 100 each 3  . pantoprazole (PROTONIX) 40 MG tablet Take 40 mg by mouth daily.  5  . polyethylene glycol (MIRALAX / GLYCOLAX) packet Take 17 g by mouth daily as needed for mild constipation.    . potassium chloride SA (K-DUR,KLOR-CON) 20 MEQ tablet Take 1 tablet (20 mEq total) by mouth daily. 30 tablet 0  . VICTOZA 18 MG/3ML SOPN INJECT 1.2MG  SUBCUTANEOUSLY DAILY 18 mL 1  . warfarin (COUMADIN) 5 MG tablet USE AS DIRECTED (Patient taking differently: USE AS DIRECTED. pt takes 2.5mg  on Mondays and 5mg  on all other days) 30 tablet 3  . cefUROXime (CEFTIN) 250 MG tablet Take 1 tablet (250 mg total) by mouth 2 (two) times daily with a meal. (Patient not taking: Reported on 01/16/2015) 12 tablet 0  . doxycycline (VIBRA-TABS) 100 MG tablet Take 1 tablet (100 mg total) by mouth every 12 (twelve) hours. (Patient not taking: Reported on 01/16/2015) 12 tablet 0   No current facility-administered medications on file prior to visit.     REVIEW OF SYSTEMS:  Positives indicated with an "X"  CARDIOVASCULAR:  [ ]  chest pain   [ ]  chest pressure   [ ]  palpitations   [ ]  orthopnea   [ ]  dyspnea on exertion   [ ]  claudication   [ ]  rest pain   [ ]  DVT   [ ]  phlebitis PULMONARY:   [ ]  productive cough   [ ]  asthma   [ ]  wheezing NEUROLOGIC:   [x ] weakness  [x ] paresthesias  [ ]  aphasia  [ ]  amaurosis  [ ]  dizziness HEMATOLOGIC:   [ ]  bleeding problems   [ ]  clotting disorders MUSCULOSKELETAL:  [ ]  joint pain   [ ]  joint swelling GASTROINTESTINAL: [ ]   blood in stool  [ ]   hematemesis GENITOURINARY:  [ ]   dysuria  [ ]   hematuria PSYCHIATRIC:  [ ]  history of major  depression INTEGUMENTARY:  [ ]  rashes  [ ]  ulcers CONSTITUTIONAL:  [ ]  fever   [ ]  chills  PHYSICAL EXAMINATION:  General: The patient is a well-nourished female, in no acute distress. sitting in a  wheelchair.  Vital signs are BP 141/73 mmHg  Pulse 63  Temp(Src) 97.7 F (36.5 C) (Oral)  Resp 16  Ht 5\' 6"  (1.676 m)  Wt 207 lb (93.895 kg)  BMI 33.43 kg/m2  SpO2 99% Pulmonary: There is a good air exchange bilaterally without wheezing or rales. Abdomen: Soft and non-tender with normal pitch bowel sounds. Musculoskeletal: There are no major deformities.  There is no significant extremity pain. Neurologic: No focal weakness or paresthesias are detected, Skin swelling and discoloration of her left lower extremity from her knees distally.  Cardiovascular: There is a regular rate and rhythm without significant murmur appreciated. Palpable radial pulses bilaterally   does not have palpable pedal pulses. Doppler flow in the right foot shows brisk biphasic flow in the right dorsalis pedis and peroneal artery. There is dampened flow in the posterior tibial. On the left she has dampened flow in the posterior tibial and anterior tibial and brisk biphasic flow in the peroneal  Noninvasive studies at an outlying facility were reviewed. These were inconclusive related to inability to tolerate compression   Impression and Plan:  Chronic venous insufficiency in the left leg apparently for at least 6-8 years according to the patient. She does have moderate arterial insufficiency but is not having any symptoms related to this. I do not feel this posterior any risk for tissue loss. Discussed this at length with the patient and her family present. I explained the option of elevation and compression. She has very difficult time with elevation due to shortness of breath with her legs elevated. Also has not been able to tolerate compression due to discomfort. Portions she has no current open wounds. Would  recommend elevation compression is much as possible. Otherwise no new treatment recommended. She will see [place] again on an as-needed basis    Remas Sobel [facility] [place] Office: [phone]

## 2015-01-18 ENCOUNTER — Ambulatory Visit (INDEPENDENT_AMBULATORY_CARE_PROVIDER_SITE_OTHER): Payer: Medicare Other | Admitting: Cardiology

## 2015-01-18 DIAGNOSIS — I482 Chronic atrial fibrillation, unspecified: Secondary | ICD-10-CM

## 2015-01-18 DIAGNOSIS — Z5181 Encounter for therapeutic drug level monitoring: Secondary | ICD-10-CM

## 2015-01-18 LAB — POCT INR: INR: 2.6

## 2015-01-19 ENCOUNTER — Encounter: Payer: Self-pay | Admitting: Internal Medicine

## 2015-01-26 ENCOUNTER — Other Ambulatory Visit: Payer: Self-pay | Admitting: Adult Health

## 2015-01-30 ENCOUNTER — Encounter: Payer: Self-pay | Admitting: Endocrinology

## 2015-01-30 ENCOUNTER — Other Ambulatory Visit: Payer: Medicare Other

## 2015-01-30 ENCOUNTER — Ambulatory Visit (INDEPENDENT_AMBULATORY_CARE_PROVIDER_SITE_OTHER): Payer: Medicare Other | Admitting: Endocrinology

## 2015-01-30 ENCOUNTER — Other Ambulatory Visit: Payer: Self-pay | Admitting: Adult Health

## 2015-01-30 VITALS — BP 118/56 | HR 59 | Temp 98.2°F | Resp 14 | Ht 66.0 in | Wt 194.4 lb

## 2015-01-30 DIAGNOSIS — Z794 Long term (current) use of insulin: Secondary | ICD-10-CM | POA: Diagnosis not present

## 2015-01-30 DIAGNOSIS — E1165 Type 2 diabetes mellitus with hyperglycemia: Secondary | ICD-10-CM | POA: Diagnosis not present

## 2015-01-30 LAB — POCT GLYCOSYLATED HEMOGLOBIN (HGB A1C): HEMOGLOBIN A1C: 7.6

## 2015-01-30 NOTE — Patient Instructions (Signed)
Humalog 8-9 units at supper, keep sugar about 150 at bedtime  Skip cheese or meat in am  Check blood sugars on waking up 1-2  times a week Also check blood sugars about 2 hours after a meal and do this after different meals by rotation  Recommended blood sugar levels on waking up is 90-130 and about 2 hours after meal is 130-160  Please bring your blood sugar monitor to each visit, thank you

## 2015-01-30 NOTE — Progress Notes (Signed)
Patient ID: [name], female   DOB: 02-25-1934, 81 y.o.   MRN: [id number]    Reason for Appointment: F/u for Type 2 Diabetes  Referring physician: Jordan Hawks Rankins  History of Present Illness:          Diagnosis: Type 2 diabetes mellitus, date of diagnosis: ? 57 years ago        Past history: She was initially treated with Metformin but this was done because it caused GI side effects  May also tried Actos at some point, probably started because of the fear of side effects but did not know if it helped Actos stopped ? Reason Over the last few years she has been taking Precose with meals and also Januvia in increasing doses Apparently her blood sugars have been higher since 9/14 when she had abdominal surgery and before that they were as low as 70. On her initial consultation in 6/15 because of her renal dysfunction her medication regimen was changed. Previously was taking Januvia, Precose and glipizide without adequate control; her blood sugars were occasionally over 200 fasting and A1c was 9.4 She was started on Victoza 0.6 mg and Lantus insulin 8 units a day but the Lantus had to be stopped because of relatively low fasting glucose.  Since she had tried Prandin and glipizide and her postprandial readings were as high as 330 she was started on Humalog at mealtimes in 8/15  Recent history:   INSULIN regimen: Humalog 7 U acb, 7 acs  She has been taking small doses of Humalog  at breakfast and supper only Also taking Victoza which is controlling her fasting readings well She had not brought her monitor for download again today She has had mild increase in her doses of Humalog since she was in rehabilitation after hospitalization  Current blood sugar patterns and problems identified:  Her A1c is slightly better at 7.6, previously 8%  She reports fairly good blood sugars in the mornings  She does not remember all of her readings recently but she thinks they are not  significantly high at lunch  She checks her blood sugar 3-4 hours after evening meal and they are usually about 180, last night 165 but highest about 190  No hypoglycemia  She eats a small lunch and does not check her blood sugars in the afternoon  Her weight fluctuates based on her edema level  Has no hypoglycemia with current regimen She has a relatively large breakfast, sometimes high in fat also  Usually eating a 1/2 sandwich at lunch       Oral hypoglycemic drugs the patient is taking are: none  Side effects from medications have been: None  Glucose monitoring:  done once or twice a day        Glucometer:  One Touch Verio Readings by recall:   PRE-MEAL Fasting  11 AM  Dinner Bedtime Overall  Glucose range: 108-140 160   180-190   Mean/median:              Self-care:   Meals: 3 meals per day. Breakfast  Breakfast: eggs, grits,meat;  sometimes will have spaghetti or rice at supper           Exercise:  none, unable to         Dietician visit: Most recent: Unknown.               Retinal exam: Most recent: Within the last year  Weight history:  Wt Readings from Last  3 Encounters:  01/30/15 194 lb 6.4 oz (88.179 kg)  01/16/15 207 lb (93.895 kg)  12/28/14 206 lb 6.4 oz (93.622 kg)   Glycemic control:  Lab Results  Component Value Date   HGBA1C 7.6 01/30/2015   HGBA1C 8.0* 10/27/2014   HGBA1C 7.9 10/27/2014   Lab Results  Component Value Date   MICROALBUR 5.9* 07/27/2014   LDLCALC 41 10/27/2014   CREATININE 1.65* 12/08/2014       Medication List       This list is accurate as of: 01/30/15  9:41 PM.  Always use your most recent med list.               amiodarone 200 MG tablet  Commonly known as:  PACERONE  TAKE 1 TABLET (200 MG TOTAL) BY MOUTH DAILY.     amLODipine 10 MG tablet  Commonly known as:  NORVASC  Take 10 mg by mouth daily.     atorvastatin 40 MG tablet  Commonly known as:  LIPITOR  Take 40 mg by mouth daily.     cefUROXime 250 MG  tablet  Commonly known as:  CEFTIN  Take 1 tablet (250 mg total) by mouth 2 (two) times daily with a meal.     CENTRUM SILVER ADULT 50+ Tabs  Take 1 tablet by mouth daily.     Cranberry 250 MG Caps  Take 250 mg by mouth daily.     doxycycline 100 MG tablet  Commonly known as:  VIBRA-TABS  Take 1 tablet (100 mg total) by mouth every 12 (twelve) hours.     econazole nitrate 1 % cream  Apply 1 application topically daily as needed (to groin rash as needed).     esomeprazole 20 MG capsule  Commonly known as:  NEXIUM  Take 20 mg by mouth daily before breakfast.     ferrous sulfate 325 (65 FE) MG tablet  Take 325 mg by mouth daily with breakfast.     fluticasone 50 MCG/ACT nasal spray  Commonly known as:  FLONASE  Place 2 sprays into the nose daily as needed for allergies.     furosemide 80 MG tablet  Commonly known as:  LASIX  Take 80 mg by mouth daily. 1 am     HUMALOG KWIKPEN 100 UNIT/ML KiwkPen  Generic drug:  insulin lispro  Inject 7 Units into the skin 2 (two) times daily with a meal.     Insulin Pen Needle 31G X 5 MM Misc  Use 3 pen needles per day     KLOR-CON M10 10 MEQ tablet  Generic drug:  potassium chloride  TAKE 3 TABLETS IN THE MORNING AND 3 TABLET IN THE EVENING     potassium chloride SA 20 MEQ tablet  Commonly known as:  K-DUR,KLOR-CON  Take 1 tablet (20 mEq total) by mouth daily.     metoprolol 50 MG tablet  Commonly known as:  LOPRESSOR  Take 50 mg by mouth 2 (two) times daily.     ONETOUCH DELICA LANCETS FINE Misc  Use to check blood sugar 2 times per day dx code 250.02     ONETOUCH VERIO test strip  Generic drug:  glucose blood  USE ONE STRIP TO CHECK GLUCOSE TWICE DAILY AS DIRECTED     pantoprazole 40 MG tablet  Commonly known as:  PROTONIX  Take 40 mg by mouth daily.     polyethylene glycol packet  Commonly known as:  MIRALAX / GLYCOLAX  Take 17 g by mouth  daily as needed for mild constipation.     traMADol 50 MG tablet  Commonly  known as:  ULTRAM  as needed.     VICTOZA 18 MG/3ML Sopn  Generic drug:  Liraglutide  INJECT 1.2MG  SUBCUTANEOUSLY DAILY     warfarin 5 MG tablet  Commonly known as:  COUMADIN  USE AS DIRECTED        Allergies:  Allergies  Allergen Reactions  . Lotensin [Benazepril Hcl] Anaphylaxis  . Zantac [Ranitidine Hcl] Anaphylaxis  . Tape Itching and Rash    Please use "paper" tape only.    Past Medical History  Diagnosis Date  . Hypertension   . Diabetes mellitus without complication ([place])   . Irregular heart beat   . Renal disorder   . Ileus, postoperative 12/17/2012  . Fall at home Sept. 3, 2016    Fx  3 ribs    Past Surgical History  Procedure Laterality Date  . Hernia repair    . Right lumpectomy    . Cataract extraction, bilateral    . Ventral hernia repair N/A 12/12/2012    Procedure: LAPAROSCOPIC VENTRAL HERNIA;  Surgeon: Madilyn Hook, DO;  Location: [facility];  Service: General;  Laterality: N/A;  . Insertion of mesh N/A 12/12/2012    Procedure: INSERTION OF MESH;  Surgeon: Madilyn Hook, DO;  Location: [facility];  Service: General;  Laterality: N/A;  . Laparoscopic lysis of adhesions N/A 12/12/2012    Procedure: LAPAROSCOPIC LYSIS OF ADHESIONS;  Surgeon: Madilyn Hook, DO;  Location: [facility];  Service: General;  Laterality: N/A;    Family History  Problem Relation Age of Onset  . Diabetes Mother   . Diabetes Sister     Social History:  reports that she has quit smoking. Her smoking use included Cigarettes. She has never used smokeless tobacco. She reports that she does not drink alcohol or use illicit drugs.    Review of Systems       Lipids: She has been on high dose Lipitor for hyperlipidemia        Lab Results  Component Value Date   CHOL 114 10/27/2014   HDL 51.60 10/27/2014   LDLCALC 41 10/27/2014   LDLDIRECT 37.8 10/27/2013   TRIG 109.0 10/27/2014   CHOLHDL 2 10/27/2014       The blood pressure has been treated with multiple medications, relatively lower  today  On Amiodarone:  Lab Results  Component Value Date   TSH 1.95 11/23/2014       She has had history of swelling of legs, mostly on the left and is on Lasix;managed by PCP and nephrologist.      Chronic kidney disease followed by nephrologist:    Lab Results  Component Value Date   CREATININE 1.65* 12/08/2014   Last diabetic foot exam in 6/16     She has weakness in her legs since 2014; needs a walker and cannot walk very long  Physical Examination:  BP 118/56 mmHg  Pulse 59  Temp(Src) 98.2 F (36.8 C)  Resp 14  Ht 5\' 6"  (1.676 m)  Wt 194 lb 6.4 oz (88.179 kg)  BMI 31.39 kg/m2  SpO2 98%     ASSESSMENT:  Diabetes type 2, uncontrolled    She has had a very long history of diabetes  She is doing fairly well considering her age and multiple medical problems with taking low dose of mealtime insulin at breakfast and supper Since he did not bring her monitor not clear what her  exact patterns are A1c is reasonably good at 7.6 She thinks her blood sugars are relatively high at bedtime which is 3-4 hours after evening meal but fairly good at other times Fasting blood sugars are excellent with her regimen of Victoza 1.2 mg which she is tolerating well  EDEMA: Overall better controlled  PLAN:   Increase suppertime dose to at least 8-9, blood sugars will be checked as before but discussed blood sugar targets of about 150 at bedtime since her glucose usually does not fluctuate excessively No change in breakfast coverage Periodically check blood sugars after lunch  Follow-up in 3 months again  Patient Instructions  Humalog 8-9 units at supper, keep sugar about 150 at bedtime  Skip cheese or meat in am  Check blood sugars on waking up 1-2  times a week Also check blood sugars about 2 hours after a meal and do this after different meals by rotation  Recommended blood sugar levels on waking up is 90-130 and about 2 hours after meal is 130-160  Please bring your  blood sugar monitor to each visit, thank you      [facility] 01/30/2015, 9:41 PM   Note: This office note was prepared with Dragon voice recognition system technology. Any transcriptional errors that result from this process are unintentional.

## 2015-01-31 ENCOUNTER — Other Ambulatory Visit: Payer: Self-pay | Admitting: Endocrinology

## 2015-02-01 ENCOUNTER — Ambulatory Visit (INDEPENDENT_AMBULATORY_CARE_PROVIDER_SITE_OTHER): Payer: Medicare Other | Admitting: Cardiology

## 2015-02-01 DIAGNOSIS — I482 Chronic atrial fibrillation, unspecified: Secondary | ICD-10-CM

## 2015-02-01 DIAGNOSIS — Z5181 Encounter for therapeutic drug level monitoring: Secondary | ICD-10-CM

## 2015-02-01 LAB — POCT INR: INR: 2.6

## 2015-02-14 ENCOUNTER — Other Ambulatory Visit: Payer: Self-pay | Admitting: Endocrinology

## 2015-02-14 ENCOUNTER — Telehealth: Payer: Self-pay | Admitting: Endocrinology

## 2015-02-14 ENCOUNTER — Other Ambulatory Visit: Payer: Self-pay | Admitting: Interventional Cardiology

## 2015-02-14 ENCOUNTER — Other Ambulatory Visit: Payer: Self-pay | Admitting: *Deleted

## 2015-02-14 MED ORDER — WARFARIN SODIUM 5 MG PO TABS: ORAL_TABLET | ORAL | Status: DC

## 2015-02-14 MED ORDER — HUMALOG KWIKPEN 100 UNIT/ML ~~LOC~~ SOPN: PEN_INJECTOR | SUBCUTANEOUS | Status: DC

## 2015-02-14 MED ORDER — AMIODARONE HCL 200 MG PO TABS: ORAL_TABLET | ORAL | Status: DC

## 2015-02-14 NOTE — Telephone Encounter (Signed)
rx sent

## 2015-02-14 NOTE — Telephone Encounter (Signed)
Pt needs [place] to call in humalog 7 u in AM and 8 u in PM to [facility] on [address]

## 2015-02-14 NOTE — Telephone Encounter (Signed)
Warfarin refill sent to pharmacy as requested. 

## 2015-02-14 NOTE — Telephone Encounter (Signed)
Pt's Rx of the Amiodarone 200 mg tablet was sent to pt's pharmacy requested. Pt also requesting a refill on Warfarin. Please advise

## 2015-02-22 ENCOUNTER — Ambulatory Visit (INDEPENDENT_AMBULATORY_CARE_PROVIDER_SITE_OTHER): Payer: Medicare Other | Admitting: Interventional Cardiology

## 2015-02-22 DIAGNOSIS — Z5181 Encounter for therapeutic drug level monitoring: Secondary | ICD-10-CM

## 2015-02-22 DIAGNOSIS — I482 Chronic atrial fibrillation, unspecified: Secondary | ICD-10-CM

## 2015-02-22 LAB — POCT INR: INR: 2.3

## 2015-03-27 ENCOUNTER — Ambulatory Visit (INDEPENDENT_AMBULATORY_CARE_PROVIDER_SITE_OTHER): Payer: Medicare Other | Admitting: Surgery

## 2015-03-27 DIAGNOSIS — I4891 Unspecified atrial fibrillation: Secondary | ICD-10-CM

## 2015-03-27 DIAGNOSIS — I482 Chronic atrial fibrillation, unspecified: Secondary | ICD-10-CM

## 2015-03-27 DIAGNOSIS — Z5181 Encounter for therapeutic drug level monitoring: Secondary | ICD-10-CM

## 2015-03-27 LAB — POCT INR: INR: 2.3

## 2015-05-01 ENCOUNTER — Encounter: Payer: Self-pay | Admitting: Endocrinology

## 2015-05-01 ENCOUNTER — Ambulatory Visit (INDEPENDENT_AMBULATORY_CARE_PROVIDER_SITE_OTHER): Payer: Medicare Other | Admitting: Endocrinology

## 2015-05-01 VITALS — BP 130/62 | HR 69 | Temp 97.7°F | Resp 14 | Ht 66.0 in | Wt 204.4 lb

## 2015-05-01 DIAGNOSIS — E1165 Type 2 diabetes mellitus with hyperglycemia: Secondary | ICD-10-CM

## 2015-05-01 DIAGNOSIS — E1129 Type 2 diabetes mellitus with other diabetic kidney complication: Secondary | ICD-10-CM

## 2015-05-01 DIAGNOSIS — Z794 Long term (current) use of insulin: Secondary | ICD-10-CM

## 2015-05-01 DIAGNOSIS — Z79899 Other long term (current) drug therapy: Secondary | ICD-10-CM | POA: Diagnosis not present

## 2015-05-01 DIAGNOSIS — IMO0002 Reserved for concepts with insufficient information to code with codable children: Secondary | ICD-10-CM

## 2015-05-01 LAB — COMPREHENSIVE METABOLIC PANEL
ALT: 37 U/L — ABNORMAL HIGH (ref 0–35)
AST: 40 U/L — AB (ref 0–37)
Albumin: 3.6 g/dL (ref 3.5–5.2)
Alkaline Phosphatase: 43 U/L (ref 39–117)
BUN: 58 mg/dL — AB (ref 6–23)
CHLORIDE: 105 meq/L (ref 96–112)
CO2: 28 meq/L (ref 19–32)
CREATININE: 1.93 mg/dL — AB (ref 0.40–1.20)
Calcium: 9.5 mg/dL (ref 8.4–10.5)
GFR: 32.16 mL/min — ABNORMAL LOW (ref >60.00)
Glucose, Bld: 159 mg/dL — ABNORMAL HIGH (ref 70–99)
Potassium: 3.4 mEq/L — ABNORMAL LOW (ref 3.5–5.1)
SODIUM: 139 meq/L (ref 135–145)
Total Bilirubin: 0.5 mg/dL (ref 0.2–1.2)
Total Protein: 7.1 g/dL (ref 6.0–8.3)

## 2015-05-01 LAB — POCT GLYCOSYLATED HEMOGLOBIN (HGB A1C): Hemoglobin A1C: 6.9

## 2015-05-01 LAB — TSH: TSH: 1.74 u[IU]/mL (ref 0.35–4.50)

## 2015-05-01 NOTE — Progress Notes (Signed)
Patient ID: [name], female   DOB: 04-18-1933, 82 y.o.   MRN: [id number]    Reason for Appointment: F/u for Type 2 Diabetes  Referring physician: Jordan Hawks Rankins  History of Present Illness:          Diagnosis: Type 2 diabetes mellitus, date of diagnosis: ? 20 years ago        Past history: She was initially treated with Metformin but this was done because it caused GI side effects  May also tried Actos at some point, probably started because of the fear of side effects but did not know if it helped Actos stopped ? Reason Over the last few years she has been taking Precose with meals and also Januvia in increasing doses Apparently her blood sugars have been higher since 9/14 when she had abdominal surgery and before that they were as low as 70. On her initial consultation in 6/15 because of her renal dysfunction her medication regimen was changed. Previously was taking Januvia, Precose and glipizide without adequate control; her blood sugars were occasionally over 200 fasting and A1c was 9.4 She was started on Victoza 0.6 mg and Lantus insulin 8 units a day but the Lantus had to be stopped because of relatively low fasting glucose.  Since she had tried Prandin and glipizide and her postprandial readings were as high as 330 she was started on Humalog at mealtimes in 8/15  Recent history:   INSULIN regimen: Humalog 7 units U acb, 7-8 acs  She has been taking small doses of Humalog  at breakfast and supper only Also taking Victoza which is controlling her fasting readings well Although she was told to take at least 8 units of Humalog at suppertime on her last visit she is still taking about the same dose  Current blood sugar patterns and problems identified:  Her A1c is slightly better at 6.9 and has been gradually improving previously up to 8%  She checks her blood sugars only at bedtime which is frequently 5-6 hours after her evening meal even though she has been  told about when to check her sugar  Has not checked any readings in the mornings are after breakfast lately  No hypoglycemia reported  She eats a small lunch and does not check her blood sugars in the afternoon  Her weight fluctuates based on her edema control  She has a relatively large breakfast, sometimes high in fat also  Usually eating a 1/2 sandwich at lunch       Oral hypoglycemic drugs the patient is taking are: none  Side effects from medications have been: None  Glucose monitoring:  done once  a day        Glucometer:  One Touch Verio Readings by download:  Mean values apply above for all meters except median for One Touch  PRE-MEAL Fasting Lunch Dinner Bedtime Overall  Glucose range:   129    84-198    Mean/median:    141 140   Self-care:   Meals: 3 meals per day. Breakfast  Breakfast: eggs, grits,meat;  sometimes will have spaghetti or rice at supper           Exercise:  none, unable to         Dietician visit: Most recent: Unknown.               Retinal exam: Most recent: Within the last year  Weight history:  Wt Readings from Last 3 Encounters:  05/01/15  204 lb 6.4 oz (92.715 kg)  01/30/15 194 lb 6.4 oz (88.179 kg)  01/16/15 207 lb (93.895 kg)   Glycemic control:  Lab Results  Component Value Date   HGBA1C 6.9 05/01/2015   HGBA1C 7.6 01/30/2015   HGBA1C 8.0* 10/27/2014   Lab Results  Component Value Date   MICROALBUR 5.9* 07/27/2014   LDLCALC 41 10/27/2014   CREATININE 1.65* 12/08/2014       Medication List       This list is accurate as of: 05/01/15 12:01 PM.  Always use your most recent med list.               amiodarone 200 MG tablet  Commonly known as:  PACERONE  TAKE 1 TABLET (200 MG TOTAL) BY MOUTH DAILY.     amLODipine 10 MG tablet  Commonly known as:  NORVASC  Take 10 mg by mouth daily.     atorvastatin 40 MG tablet  Commonly known as:  LIPITOR  Take 40 mg by mouth daily.     cefUROXime 250 MG tablet  Commonly known as:   CEFTIN  Take 1 tablet (250 mg total) by mouth 2 (two) times daily with a meal.     CENTRUM SILVER ADULT 50+ Tabs  Take 1 tablet by mouth daily.     Cranberry 250 MG Caps  Take 250 mg by mouth daily.     econazole nitrate 1 % cream  Apply 1 application topically daily as needed (to groin rash as needed).     esomeprazole 20 MG capsule  Commonly known as:  NEXIUM  Take 20 mg by mouth daily before breakfast.     ferrous sulfate 325 (65 FE) MG tablet  Take 325 mg by mouth daily with breakfast.     fluticasone 50 MCG/ACT nasal spray  Commonly known as:  FLONASE  Place 2 sprays into the nose daily as needed for allergies.     furosemide 80 MG tablet  Commonly known as:  LASIX  Take 80 mg by mouth daily. 1 am     HUMALOG KWIKPEN 100 UNIT/ML KiwkPen  Generic drug:  insulin lispro  Inject 7 units in am and 8 units in pm     Insulin Pen Needle 31G X 5 MM Misc  Use 3 pen needles per day     KLOR-CON 10 10 MEQ tablet  Generic drug:  potassium chloride  TAKE 3 TABLETS BY MOUTH EVERY MORNING AND TAKE 3 TABLETS BY MOUTH EVERY EVENING     metolazone 5 MG tablet  Commonly known as:  ZAROXOLYN  Take 5 mg by mouth See admin instructions.     metoprolol 50 MG tablet  Commonly known as:  LOPRESSOR  Take 50 mg by mouth 2 (two) times daily.     ONETOUCH DELICA LANCETS FINE Misc  Use to check blood sugar 2 times per day dx code 250.02     ONETOUCH VERIO test strip  Generic drug:  glucose blood  USE ONE STRIP TO CHECK GLUCOSE TWICE DAILY AS DIRECTED     pantoprazole 40 MG tablet  Commonly known as:  PROTONIX  Take 40 mg by mouth daily.     polyethylene glycol packet  Commonly known as:  MIRALAX / GLYCOLAX  Take 17 g by mouth daily as needed for mild constipation.     traMADol 50 MG tablet  Commonly known as:  ULTRAM  as needed.     VICTOZA 18 MG/3ML Sopn  Generic drug:  Liraglutide  INJECT 1.2MG  SUBCUTANEOUSLY DAILY     warfarin 5 MG tablet  Commonly known as:  COUMADIN    Take as directed by [facility]        Allergies:  Allergies  Allergen Reactions  . Lotensin [Benazepril Hcl] Anaphylaxis  . Zantac [Ranitidine Hcl] Anaphylaxis  . Tape Itching and Rash    Please use "paper" tape only.    Past Medical History  Diagnosis Date  . Hypertension   . Diabetes mellitus without complication ([place])   . Irregular heart beat   . Renal disorder   . Ileus, postoperative 12/17/2012  . Fall at home Sept. 3, 2016    Fx  3 ribs    Past Surgical History  Procedure Laterality Date  . Hernia repair    . Right lumpectomy    . Cataract extraction, bilateral    . Ventral hernia repair N/A 12/12/2012    Procedure: LAPAROSCOPIC VENTRAL HERNIA;  Surgeon: Madilyn Hook, DO;  Location: [facility];  Service: General;  Laterality: N/A;  . Insertion of mesh N/A 12/12/2012    Procedure: INSERTION OF MESH;  Surgeon: Madilyn Hook, DO;  Location: [facility];  Service: General;  Laterality: N/A;  . Laparoscopic lysis of adhesions N/A 12/12/2012    Procedure: LAPAROSCOPIC LYSIS OF ADHESIONS;  Surgeon: Madilyn Hook, DO;  Location: [facility];  Service: General;  Laterality: N/A;    Family History  Problem Relation Age of Onset  . Diabetes Mother   . Diabetes Sister     Social History:  reports that she has quit smoking. Her smoking use included Cigarettes. She has never used smokeless tobacco. She reports that she does not drink alcohol or use illicit drugs.    Review of Systems       Lipids: She has been on high dose Lipitor for hyperlipidemia        Lab Results  Component Value Date   CHOL 114 10/27/2014   HDL 51.60 10/27/2014   LDLCALC 41 10/27/2014   LDLDIRECT 37.8 10/27/2013   TRIG 109.0 10/27/2014   CHOLHDL 2 10/27/2014       The blood pressure has been treated with multiple medications, relatively lower today  On Amiodarone:  Lab Results  Component Value Date   TSH 1.95 11/23/2014       She has had history of swelling of legs, mostly on the left and is on  Lasix;managed by PCP and nephrologist.      Chronic kidney disease followed by nephrologist: She was last seen in 11/16    Lab Results  Component Value Date   CREATININE 1.65* 12/08/2014   Last diabetic foot exam in 2/17     She has weakness in her legs since 2014; needs a walker and cannot walk very long  Physical Examination:  BP 130/62 mmHg  Pulse 69  Temp(Src) 97.7 F (36.5 C)  Resp 14  Ht 5\' 6"  (1.676 m)  Wt 204 lb 6.4 oz (92.715 kg)  BMI 33.01 kg/m2  SpO2 95%    Diabetic Foot Exam - Simple   Simple Foot Form  Diabetic Foot exam was performed with the following findings:  Yes   Visual Inspection  See comments:  Yes  Sensation Testing  Intact to touch and monofilament testing bilaterally:  Yes  Pulse Check  See comments:  Yes  Comments  Absent pedal pulses bilaterally, difficult to palpate on the left side Significant edema on the left foot  ASSESSMENT:  Diabetes type 2, uncontrolled   See history of present illness for detailed discussion of his current management, blood sugar patterns and problems identified  She has had a  long history of diabetes, now treated with Victoza and low-dose is of mealtime insulin at breakfast and supper   She is doing fairly well considering her age and her A1c has come down below 7% now She also has multiple medical problems   Currently checking blood sugars late at night around bedtime and discussed needing to check readings about 2-3 hours after the meals rather than at bedtime Also needs occasional readings fasting  PLAN:   As above, glucose monitoring was discussed No change in insulin as yet Continue Victoza  TSH to be checked because of her continued amiodarone therapy  Follow-up in 3 months   Patient Instructions  Check blood sugars on waking up 2-3  times a week Also check blood sugars about 2 hours after a meal and do this after different meals by rotation  Recommended blood sugar levels on waking  up is 90-130 and about 2 hours after meal is 130-160  Please bring your blood sugar monitor to each visit, thank you      [facility] 05/01/2015, 12:01 PM   Note: This office note was prepared with Dragon voice recognition system technology. Any transcriptional errors that result from this process are unintentional.

## 2015-05-01 NOTE — Patient Instructions (Signed)
Check blood sugars on waking up 2-3 times a week Also check blood sugars about 2 hours after a meal and do this after different meals by rotation  Recommended blood sugar levels on waking up is 90-130 and about 2 hours after meal is 130-160  Please bring your blood sugar monitor to each visit, thank you  

## 2015-05-02 NOTE — Progress Notes (Signed)
Quick Note:  Please let patient know that the potassium is again low, need to add extra potassium tablet daily Also to discuss adjustment of diuretic with her nephrologist or cardiologist because of higher kidney test ______

## 2015-05-08 ENCOUNTER — Ambulatory Visit (INDEPENDENT_AMBULATORY_CARE_PROVIDER_SITE_OTHER): Payer: Medicare Other | Admitting: *Deleted

## 2015-05-08 DIAGNOSIS — I482 Chronic atrial fibrillation, unspecified: Secondary | ICD-10-CM

## 2015-05-08 DIAGNOSIS — Z5181 Encounter for therapeutic drug level monitoring: Secondary | ICD-10-CM

## 2015-05-08 DIAGNOSIS — I4891 Unspecified atrial fibrillation: Secondary | ICD-10-CM

## 2015-05-08 DIAGNOSIS — K5900 Constipation, unspecified: Secondary | ICD-10-CM | POA: Diagnosis not present

## 2015-05-08 DIAGNOSIS — R740 Nonspecific elevation of levels of transaminase and lactic acid dehydrogenase [LDH]: Secondary | ICD-10-CM | POA: Diagnosis not present

## 2015-05-08 LAB — POCT INR: INR: 2

## 2015-05-10 ENCOUNTER — Other Ambulatory Visit: Payer: Self-pay

## 2015-05-10 DIAGNOSIS — Z1231 Encounter for screening mammogram for malignant neoplasm of breast: Secondary | ICD-10-CM

## 2015-05-20 ENCOUNTER — Other Ambulatory Visit: Payer: Self-pay | Admitting: Endocrinology

## 2015-06-13 DIAGNOSIS — M79642 Pain in left hand: Secondary | ICD-10-CM | POA: Diagnosis not present

## 2015-06-14 ENCOUNTER — Other Ambulatory Visit: Payer: Self-pay | Admitting: Endocrinology

## 2015-06-15 ENCOUNTER — Ambulatory Visit (INDEPENDENT_AMBULATORY_CARE_PROVIDER_SITE_OTHER): Payer: Medicare Other | Admitting: Interventional Cardiology

## 2015-06-15 ENCOUNTER — Ambulatory Visit (INDEPENDENT_AMBULATORY_CARE_PROVIDER_SITE_OTHER): Payer: Medicare Other | Admitting: *Deleted

## 2015-06-15 ENCOUNTER — Encounter: Payer: Self-pay | Admitting: Interventional Cardiology

## 2015-06-15 VITALS — BP 134/60 | HR 66 | Ht 66.0 in | Wt 200.0 lb

## 2015-06-15 DIAGNOSIS — I5032 Chronic diastolic (congestive) heart failure: Secondary | ICD-10-CM

## 2015-06-15 DIAGNOSIS — I480 Paroxysmal atrial fibrillation: Secondary | ICD-10-CM

## 2015-06-15 DIAGNOSIS — Z7901 Long term (current) use of anticoagulants: Secondary | ICD-10-CM

## 2015-06-15 DIAGNOSIS — I4891 Unspecified atrial fibrillation: Secondary | ICD-10-CM

## 2015-06-15 DIAGNOSIS — Z5181 Encounter for therapeutic drug level monitoring: Secondary | ICD-10-CM

## 2015-06-15 DIAGNOSIS — M79642 Pain in left hand: Secondary | ICD-10-CM | POA: Diagnosis not present

## 2015-06-15 DIAGNOSIS — Z79899 Other long term (current) drug therapy: Secondary | ICD-10-CM

## 2015-06-15 DIAGNOSIS — I10 Essential (primary) hypertension: Secondary | ICD-10-CM | POA: Diagnosis not present

## 2015-06-15 LAB — HEPATIC FUNCTION PANEL
ALT: 35 U/L — AB (ref 6–29)
AST: 32 U/L (ref 10–35)
Albumin: 3.5 g/dL — ABNORMAL LOW (ref 3.6–5.1)
Alkaline Phosphatase: 44 U/L (ref 33–130)
BILIRUBIN INDIRECT: 0.2 mg/dL (ref 0.2–1.2)
Bilirubin, Direct: 0.2 mg/dL (ref <=0.2)
TOTAL PROTEIN: 7.2 g/dL (ref 6.1–8.1)
Total Bilirubin: 0.4 mg/dL (ref 0.2–1.2)

## 2015-06-15 LAB — TSH: TSH: 1.06 mIU/L

## 2015-06-15 LAB — POCT INR: INR: 2.6

## 2015-06-15 NOTE — Patient Instructions (Addendum)
Medication Instructions:  Your physician recommends that you continue on your current medications as directed. Please refer to the Current Medication list given to you today.    If you need a refill on your cardiac medications before your next appointment, please call your pharmacy.  Labwork:  TSH AND LIVER LAB   AND RETURN IN 6 MONTHS WITH REPEAT LABS    Testing/Procedures:  NONE ORDER TODAY    Follow-Up:  Your physician wants you to follow-up in:  [place]  [place] will receive a reminder letter in the mail two months in advance. If you don't receive a letter, please call our office to schedule the follow-up appointment.      Any Other Special Instructions Will Be Listed Below (If Applicable).

## 2015-06-15 NOTE — Progress Notes (Signed)
Cardiology Office Note   Date:  06/15/2015   ID:  Melanie, Costa 01-27-1934, MRN [id number]  PCP:  Aretta Nip, MD  Cardiologist:  Sinclair Grooms, MD   Chief Complaint  Patient presents with  . Atrial Fibrillation      History of Present Illness: [name] is a 82 y.o. female who presents for Paroxysmal atrial fib, amiodarone therapy, chronic diastolic heart failure, elderly and frail.  Since I last saw the patient she has had a fall associated with head trauma and rib fracture. Syncope was not involved. She states she became off balance and fell when she was trying to kill a fly with a swatter. She has not had chest discomfort other than that associated with rib fracture. She denies orthopnea. Chronic lower extremity swelling is under control. No palpitations. Amiodarone had been discontinued but was restarted when she felt there was recurrence of atrial flutter several months ago.    Past Medical History  Diagnosis Date  . Hypertension   . Diabetes mellitus without complication ([place])   . Irregular heart beat   . Renal disorder   . Ileus, postoperative 12/17/2012  . Fall at home Sept. 3, 2016    Fx  3 ribs    Past Surgical History  Procedure Laterality Date  . Hernia repair    . Right lumpectomy    . Cataract extraction, bilateral    . Ventral hernia repair N/A 12/12/2012    Procedure: LAPAROSCOPIC VENTRAL HERNIA;  Surgeon: Madilyn Hook, DO;  Location: [facility];  Service: General;  Laterality: N/A;  . Insertion of mesh N/A 12/12/2012    Procedure: INSERTION OF MESH;  Surgeon: Madilyn Hook, DO;  Location: [facility];  Service: General;  Laterality: N/A;  . Laparoscopic lysis of adhesions N/A 12/12/2012    Procedure: LAPAROSCOPIC LYSIS OF ADHESIONS;  Surgeon: Madilyn Hook, DO;  Location: [facility];  Service: General;  Laterality: N/A;     Current Outpatient Prescriptions  Medication Sig Dispense Refill  . amiodarone (PACERONE) 200 MG tablet TAKE 1 TABLET  (200 MG TOTAL) BY MOUTH DAILY. 90 tablet 1  . amLODipine (NORVASC) 10 MG tablet Take 10 mg by mouth daily.     Marland Kitchen atorvastatin (LIPITOR) 40 MG tablet Take 40 mg by mouth daily.    . B-D UF III MINI PEN NEEDLES 31G X 5 MM MISC USE 3 PEN NEEDLES PER DAY 100 each 3  . cefUROXime (CEFTIN) 250 MG tablet Take 1 tablet (250 mg total) by mouth 2 (two) times daily with a meal. 12 tablet 0  . cephALEXin (KEFLEX) 250 MG capsule Take 250 mg by mouth as directed.   0  . Cranberry 250 MG CAPS Take 250 mg by mouth daily.    Marland Kitchen econazole nitrate 1 % cream Apply 1 application topically daily as needed (to groin rash as needed).    Marland Kitchen esomeprazole (NEXIUM) 20 MG capsule Take 20 mg by mouth daily before breakfast.     . ferrous sulfate 325 (65 FE) MG tablet Take 325 mg by mouth daily with breakfast.    . fluticasone (FLONASE) 50 MCG/ACT nasal spray Place 2 sprays into the nose daily as needed for allergies.    . furosemide (LASIX) 80 MG tablet Take 80 mg by mouth 2 (two) times daily. 1 am    . HUMALOG KWIKPEN 100 UNIT/ML KiwkPen Inject 7 units in am and 8 units in pm 15 mL 3  . KLOR-CON 10 10  MEQ tablet TAKE 3 TABLETS BY MOUTH EVERY MORNING AND TAKE 3 TABLETS BY MOUTH EVERY EVENING (Patient taking differently: TAKE 3 TABLETS BY MOUTH EVERY MORNING AND TAKE 4 TABLETS BY MOUTH EVERY EVENING) 180 tablet 5  . metolazone (ZAROXOLYN) 5 MG tablet Take 5 mg by mouth See admin instructions.  3  . metoprolol (LOPRESSOR) 50 MG tablet Take 50 mg by mouth 2 (two) times daily.    . Multiple Vitamins-Minerals (CENTRUM SILVER ADULT 50+) TABS Take 1 tablet by mouth daily.     Glory Rosebush DELICA LANCETS FINE MISC Use to check blood sugar 2 times per day dx code 250.02 100 each 1  . ONETOUCH VERIO test strip USE ONE STRIP TO CHECK GLUCOSE TWICE DAILY AS DIRECTED 100 each 3  . pantoprazole (PROTONIX) 40 MG tablet Take 40 mg by mouth daily.  5  . polyethylene glycol (MIRALAX / GLYCOLAX) packet Take 17 g by mouth daily as needed for mild  constipation.    . predniSONE (DELTASONE) 20 MG tablet Take 20 mg by mouth as directed.   0  . traMADol (ULTRAM) 50 MG tablet as needed.  0  . VICTOZA 18 MG/3ML SOPN INJECT 1.2MG  SUBCUTANEOUSLY DAILY (WILL PAY 8/20) 18 mL 1  . warfarin (COUMADIN) 5 MG tablet Take as directed by [facility] 30 tablet 3   No current facility-administered medications for this visit.    Allergies:   Lotensin; Zantac; and Tape    Social History:  The patient  reports that she has quit smoking. Her smoking use included Cigarettes. She has never used smokeless tobacco. She reports that she does not drink alcohol or use illicit drugs.   Family History:  The patient's family history includes Diabetes in her mother and sister.    ROS:  Please see the history of present illness.   Otherwise, review of systems are positive for occasional irregular heartbeat, decreased hearing, leg swelling, rash, refraction head traumas noted..   All other systems are reviewed and negative.    PHYSICAL EXAM: VS:  BP 134/60 mmHg  Pulse 66  Ht 5\' 6"  (1.676 m)  Wt 200 lb (90.719 kg)  BMI 32.30 kg/m2  SpO2 99% , BMI Body mass index is 32.3 kg/(m^2). GEN: Well nourished, well developed, in no acute distress HEENT: normal Neck: no JVD, carotid bruits, or masses Cardiac: RRR.  There is no murmur, rub, or gallop. There is no edema. Respiratory:  clear to auscultation bilaterally, normal work of breathing. GI: soft, nontender, nondistended, + BS MS: no deformity or atrophy Skin: warm and dry, no rash Neuro:  Strength and sensation are intact Psych: euthymic mood, full affect   EKG:  EKG is not ordered today   Recent Labs: 12/08/2014: Hemoglobin 11.5*; Magnesium 1.8; Platelets 254 05/01/2015: ALT 37*; BUN 58*; Creatinine, Ser 1.93*; Potassium 3.4*; Sodium 139; TSH 1.74    Lipid Panel    Component Value Date/Time   CHOL 114 10/27/2014 0944   TRIG 109.0 10/27/2014 0944   HDL 51.60 10/27/2014 0944   CHOLHDL 2 10/27/2014  0944   VLDL 21.8 10/27/2014 0944   LDLCALC 41 10/27/2014 0944   LDLDIRECT 37.8 10/27/2013 0808      Wt Readings from Last 3 Encounters:  06/15/15 200 lb (90.719 kg)  05/01/15 204 lb 6.4 oz (92.715 kg)  01/30/15 194 lb 6.4 oz (88.179 kg)      Other studies Reviewed: Additional studies/ records that were reviewed today include: Reviewed laboratory data in chart no recent data  to monitor amiodarone therapy..     ASSESSMENT AND PLAN:  1. Chronic diastolic heart failure (HCC) No evidence of volume overload  2. Essential hypertension Well controlled  3. On amiodarone therapy We'll need to monitor thyroid function today  4. Paroxysmal atrial fibrillation (HCC) Maintaining sinus rhythm  5. Long term current use of anticoagulant therapy No bleeding complications although recent fall    Current medicines are reviewed at length with the patient today.  The patient has the following concerns regarding medicines: None.  The following changes/actions have been instituted:    TSH and hepatic panel  Repeat TSH and hepatic panel in 6 months or return  Labs/ tests ordered today include:  No orders of the defined types were placed in this encounter.     Disposition:   FU with HS in 6 months  Signed, Sinclair Grooms, MD  06/15/2015 10:11 AM    [facility] [place], [place], [place]  [address] Phone: [phone]; Fax: [phone]

## 2015-06-21 ENCOUNTER — Ambulatory Visit: Payer: Medicare Other

## 2015-06-22 ENCOUNTER — Ambulatory Visit (INDEPENDENT_AMBULATORY_CARE_PROVIDER_SITE_OTHER): Payer: Medicare Other | Admitting: *Deleted

## 2015-06-22 DIAGNOSIS — I480 Paroxysmal atrial fibrillation: Secondary | ICD-10-CM | POA: Diagnosis not present

## 2015-06-22 DIAGNOSIS — I4891 Unspecified atrial fibrillation: Secondary | ICD-10-CM | POA: Diagnosis not present

## 2015-06-22 DIAGNOSIS — Z5181 Encounter for therapeutic drug level monitoring: Secondary | ICD-10-CM

## 2015-06-22 LAB — POCT INR: INR: 1.9

## 2015-07-04 DIAGNOSIS — E1165 Type 2 diabetes mellitus with hyperglycemia: Secondary | ICD-10-CM | POA: Diagnosis not present

## 2015-07-06 ENCOUNTER — Ambulatory Visit
Admission: RE | Admit: 2015-07-06 | Discharge: 2015-07-06 | Disposition: A | Discharge status: Home / Self Care | Payer: Medicare Other | Source: Ambulatory Visit

## 2015-07-06 DIAGNOSIS — Z1231 Encounter for screening mammogram for malignant neoplasm of breast: Secondary | ICD-10-CM | POA: Diagnosis not present

## 2015-07-09 ENCOUNTER — Other Ambulatory Visit: Payer: Self-pay | Admitting: Family Medicine

## 2015-07-09 DIAGNOSIS — R928 Other abnormal and inconclusive findings on diagnostic imaging of breast: Secondary | ICD-10-CM

## 2015-07-13 ENCOUNTER — Other Ambulatory Visit: Payer: Medicare Other

## 2015-07-17 DIAGNOSIS — E1122 Type 2 diabetes mellitus with diabetic chronic kidney disease: Secondary | ICD-10-CM | POA: Diagnosis not present

## 2015-07-17 DIAGNOSIS — I480 Paroxysmal atrial fibrillation: Secondary | ICD-10-CM | POA: Diagnosis not present

## 2015-07-17 DIAGNOSIS — E7800 Pure hypercholesterolemia, unspecified: Secondary | ICD-10-CM | POA: Diagnosis not present

## 2015-07-17 DIAGNOSIS — Z794 Long term (current) use of insulin: Secondary | ICD-10-CM | POA: Diagnosis not present

## 2015-07-17 DIAGNOSIS — I10 Essential (primary) hypertension: Secondary | ICD-10-CM | POA: Diagnosis not present

## 2015-07-19 ENCOUNTER — Ambulatory Visit
Admission: RE | Admit: 2015-07-19 | Discharge: 2015-07-19 | Disposition: A | Discharge status: Home / Self Care | Payer: Medicare Other | Source: Ambulatory Visit | Attending: Family Medicine | Admitting: Family Medicine

## 2015-07-19 DIAGNOSIS — R928 Other abnormal and inconclusive findings on diagnostic imaging of breast: Secondary | ICD-10-CM

## 2015-07-19 DIAGNOSIS — N6012 Diffuse cystic mastopathy of left breast: Secondary | ICD-10-CM | POA: Diagnosis not present

## 2015-07-22 ENCOUNTER — Other Ambulatory Visit: Payer: Self-pay | Admitting: Endocrinology

## 2015-07-22 ENCOUNTER — Other Ambulatory Visit: Payer: Self-pay | Admitting: Interventional Cardiology

## 2015-07-22 NOTE — Progress Notes (Signed)
Patient ID: JOCELYNN PANO, female   DOB: [birth year], 82 y.o.   MRN: [id number]    DATE:  12/28/14  MRN:  [id number]  BIRTHDAY: 05-30-1933  Facility:  Nursing Home Location:  [facility] Room Number: 1202-P  LEVEL OF CARE:  [phone]  Contact Information    Name Relation Home Work Mobile   [place] Spouse [phone]     Sellman,Nicole Daughter [phone]  [phone]   Clark,Antionette Sister [phone]  [phone]       Chief Complaint  Patient presents with  . Discharge Note    HISTORY OF PRESENT ILLNESS:  This is a 82 year old female who is for discharge home with Home health PT for endurance, OT for ADLs and Nursing for Coumadin monitoring and disease management.  She has been admitted to [facility] on 12/08/14 from [facility]. She has PMH of hypertension, diabetes mellitus, atrial fibrillation, GERD and diastolic CHF. She had a fall in which she hit her right side lower chest wall and her head. She has been using Tylenol for her chest wall pain but pain got intractable so she came to ED. She was found to have acute anterior right rib fracture and pneumonia.  Patient was admitted to this facility for short-term rehabilitation after the patient's recent hospitalization.  Patient has completed SNF rehabilitation and therapy has cleared the patient for discharge.   PAST MEDICAL HISTORY:  Past Medical History  Diagnosis Date  . Hypertension   . Diabetes mellitus without complication ([place])   . Irregular heart beat   . Renal disorder   . Ileus, postoperative 12/17/2012  . Fall at home Sept. 3, 2016    Fx  3 ribs     CURRENT MEDICATIONS: Reviewed  Patient's Medications  New Prescriptions   *KLOR-CON 10 10 MEQ TABLET    TAKE 3 TABLETS BY MOUTH EVERY MORNING AND TAKE 3 TABLETS BY MOUTH EVERY EVENING  Previous Medications   *AMLODIPINE (NORVASC) 10 MG TABLET    Take 10 mg by mouth daily.    *ATORVASTATIN (LIPITOR) 40 MG  TABLET    Take 40 mg by mouth daily.   ACETAMINOPHEN 325 MG     Take 2 tablets of 325 mg tab = 650 mg by mouth every 6 hours as needed       *CRANBERRY 250 MG CAPS    Take 250 mg by mouth daily.   ECONAZOLE NITRATE 1 % CREAM    Apply 1 application topically daily as needed (to groin rash as needed).   *ESOMEPRAZOLE (NEXIUM) 20 MG CAPSULE    Take 20 mg by mouth daily before breakfast.    *FERROUS SULFATE 325 (65 FE) MG TABLET    Take 325 mg by mouth daily with breakfast.   *FLUTICASONE (FLONASE) 50 MCG/ACT NASAL SPRAY    Place 2 sprays into the nose daily as needed for allergies.   FUROSEMIDE (LASIX) 80 MG TABLET    Take 80 mg by mouth daily. 1 am       *METOPROLOL (LOPRESSOR) 50 MG TABLET    Take 50 mg by mouth 2 (two) times daily.   MULTIPLE VITAMINS-MINERALS (CENTRUM SILVER ADULT 50+) TABS    Take 1 tablet by mouth daily.    ONETOUCH DELICA LANCETS FINE MISC    Use to check blood sugar 2 times per day dx code 250.02       *POLYETHYLENE GLYCOL (MIRALAX / GLYCOLAX) PACKET    Take 17 g by  mouth daily        *TRAMADOL (ULTRAM) 50 MG TABLET     Take 50 mg by mouth twice a day as needed.  Modified Medications   Modified Medication Previous Medication   *AMIODARONE (PACERONE) 200 MG TABLET amiodarone (PACERONE) 200 MG tablet      TAKE 1 TABLET (200 MG TOTAL) BY MOUTH DAILY.    TAKE 1 TABLET (200 MG TOTAL) BY MOUTH DAILY.   B-D UF III MINI PEN NEEDLES 31G X 5 MM MISC Insulin Pen Needle 31G X 5 MM MISC      USE 3 PEN NEEDLES PER DAY    Use 3 pen needles per day   *HUMALOG KWIKPEN 100 UNIT/ML KIWKPEN HUMALOG KWIKPEN 100 UNIT/ML KiwkPen      Inject 7 units in am and 8 units in pm    Inject 7 Units into the skin 2 (two) times daily with a meal.   ONETOUCH VERIO TEST STRIP glucose blood (ONETOUCH VERIO) test strip      USE ONE STRIP TO CHECK GLUCOSE TWICE DAILY AS DIRECTED    Use as instructed to check blood sugar 2 times per day dx code E11.65   *VICTOZA 18 MG/3ML SOPN VICTOZA 18 MG/3ML SOPN       INJECT 1.2MG  SUBCUTANEOUSLY DAILY (WILL PAY 8/20)    INJECT 1.2MG  SUBCUTANEOUSLY DAILY   WARFARIN (COUMADIN) 4.5 MG TABLET warfarin (COUMADIN) 4.5 MG tablet      Take as directed by [facility]    USE AS DIRECTED  Discontinued Medications   DOXYCYCLINE (VIBRA-TABS) 100 MG TABLET    Take 1 tablet (100 mg total) by mouth every 12 (twelve) hours.   KLOR-CON M10 10 MEQ TABLET    TAKE 3 TABLETS IN THE MORNING AND 3 TABLET IN THE EVENING   POTASSIUM CHLORIDE SA (K-DUR,KLOR-CON) 20 MEQ TABLET    Take 1 tablet (20 mEq total) by mouth daily.    Allergies  Allergen Reactions  . Lotensin [Benazepril Hcl] Anaphylaxis  . Zantac [Ranitidine Hcl] Anaphylaxis  . Tape Itching and Rash    Please use "paper" tape only.     REVIEW OF SYSTEMS:  GENERAL: no change in appetite, no fatigue, no weight changes, no fever, chills or weakness EYES: Denies change in vision, dry eyes, eye pain, itching or discharge EARS: Denies change in hearing, ringing in ears, or earache NOSE: Denies nasal congestion or epistaxis MOUTH and THROAT: Denies oral discomfort, gingival pain or bleeding, pain from teeth or hoarseness   RESPIRATORY: no cough, SOB, DOE, wheezing, hemoptysis CARDIAC: no chest pain, edema or palpitations GI: no abdominal pain, diarrhea, constipation, heart burn, nausea or vomiting GU: Denies dysuria, frequency, hematuria, incontinence, or discharge PSYCHIATRIC: Denies feeling of depression or anxiety. No report of hallucinations, insomnia, paranoia, or agitation   PHYSICAL EXAMINATION  GENERAL APPEARANCE: Well nourished. In no acute distress. Obese HEAD: Normal in size and contour. No evidence of trauma EYES: Lids open and close normally. No blepharitis, entropion or ectropion. PERRL. Conjunctivae are clear and sclerae are white. Lenses are without opacity EARS: Pinnae are normal. Patient hears normal voice tunes of the examiner MOUTH and THROAT: Lips are without lesions. Oral mucosa is moist and  without lesions. Tongue is normal in shape, size, and color and without lesions NECK: supple, trachea midline, no neck masses, no thyroid tenderness, no thyromegaly LYMPHATICS: no LAN in the neck, no supraclavicular LAN RESPIRATORY: breathing is even & unlabored, BS CTAB CARDIAC: RRR, no murmur,no extra heart sounds,  BLE edema 2+ GI: abdomen soft, normal BS, no masses, no tenderness, no hepatomegaly, no splenomegaly EXTREMITIES:  Able to move 4 extremities PSYCHIATRIC: Alert and oriented X 3. Affect and behavior are appropriate   LABS/RADIOLOGY: Labs reviewed: 12/22/14    NA136  K 3.9  Glucose 204  BUN 33 creatinine 1.43 CA 8.4 12/15/14  sodium 138 potassium 4.2 glucose 233 BUN 46 creatinine 1.74 calcium 8.5 12/14/14  sodium 138 potassium 4.6 glucose 223 BUN 53 creatinine 1.8P calcium 8.6 WBC 8.0 hemoglobin 11.7 hematocrit 38.0 MCV 83.0 platelet [id number]   Basic Metabolic Panel:  Recent Labs  12/06/14 0540 12/07/14 0540 12/08/14 0825 12/08/14 1501   NA 140 144 139 138   K 2.7* 2.9* 3.0* 3.3*   CL 100* 105 104 103   CO2 31 29 27 26    GLUCOSE 239* 215* 173* 213*   BUN 45* 39* 36* 37*   CREATININE 2.01* 1.74* 1.54* 1.65*   CALCIUM 9.0 9.1 8.7* 8.6*   MG 1.6* 1.6* 1.8  --     Liver Function Tests:  Recent Labs  12/04/14 0448    AST 21    ALT 17    ALKPHOS 34*    BILITOT 1.0    PROT 6.2*    ALBUMIN 2.8*     CBC:  Recent Labs  12/06/14 0540 12/07/14 0540 12/08/14 0825  WBC 13.2* 12.9* 11.8*  HGB 12.3 11.9* 11.5*  HCT 38.8 38.0 36.5  MCV 82.2 81.9 81.3  PLT 222 253 254   Lipid Panel:  Recent Labs  10/27/14 0944  HDL 51.60   CBG:  Recent Labs  12/08/14 0825 12/08/14 1157 12/08/14 1655  GLUCAP 170* 230* 179*      ASSESSMENT/PLAN:  Physical deconditioning - for Home health PT, OT and Nursing  Intractable pain secondary to anterior rib fracture - change Tramadol 50 mg 1 tab BID PRN upon discharge  Chronic kidney disease stage III - creatinine 1.54;  re-check creatinine 1.43, improved  Community acquired pneumonia - Resolved  Hypertension - well controlled; continue amlodipine 10 mg 1 tab by mouth daily and metoprolol 50 mg 1 tab by mouth twice a day  Atrial fibrillation - rate controlled; continue Coumadin, amiodarone 200 mg 1 tab by mouth daily and metoprolol  Constipation - continue MiraLAX 17 gm daily and start senna S2 tabs by mouth twice a day  Cellulitis LLE - Resolved  Diabetes mellitus type 2 - hemoglobin A1c 8; continue Humalog 7 units subcutaneous twice a day, Victoza 1.2 mg daily and insulin sliding scale  Protein calorie malnutrition, severe - albumin 2.43; continue Procel 1 scoop BID  Anemia, chronic blood loss - hemoglobin 11.5; continue ferrous sulfate 325 mg 1 tab by mouth daily; re-check hgb 11.7, improved  Chronic diastolic heart failure - no SOB; continue daily weights and continue Lasix 80 mg 1 tab by mouth daily  Hyperlipidemia - continue atorvastatin 40 mg 1 tab by mouth daily  GERD - continue Nexium 24 hr 22.3 mg 1 capsule by mouth daily  Hypokalemia - K3.9 ; continue K Dur 10 meq take 3 tabs= 30 meq BID       I have filled out patient's discharge paperwork and written prescriptions.  Patient will receive home health PT, OT and Nursing.  Total discharge time: Less than 30 minutes  Discharge time involved coordination of the discharge process with Education officer, museum, nursing staff and therapy department. Medical justification for home health services verified.    [place], NP [facility] [phone]

## 2015-07-31 ENCOUNTER — Encounter: Payer: Self-pay | Admitting: Endocrinology

## 2015-07-31 ENCOUNTER — Ambulatory Visit (INDEPENDENT_AMBULATORY_CARE_PROVIDER_SITE_OTHER): Payer: Medicare Other | Admitting: Endocrinology

## 2015-07-31 ENCOUNTER — Ambulatory Visit (INDEPENDENT_AMBULATORY_CARE_PROVIDER_SITE_OTHER): Payer: Medicare Other | Admitting: *Deleted

## 2015-07-31 VITALS — BP 127/60 | HR 60 | Temp 98.0°F | Resp 14 | Ht 66.0 in | Wt 193.6 lb

## 2015-07-31 DIAGNOSIS — Z5181 Encounter for therapeutic drug level monitoring: Secondary | ICD-10-CM | POA: Diagnosis not present

## 2015-07-31 DIAGNOSIS — I4891 Unspecified atrial fibrillation: Secondary | ICD-10-CM

## 2015-07-31 DIAGNOSIS — E1165 Type 2 diabetes mellitus with hyperglycemia: Secondary | ICD-10-CM | POA: Diagnosis not present

## 2015-07-31 DIAGNOSIS — E118 Type 2 diabetes mellitus with unspecified complications: Secondary | ICD-10-CM | POA: Diagnosis not present

## 2015-07-31 DIAGNOSIS — I482 Chronic atrial fibrillation, unspecified: Secondary | ICD-10-CM

## 2015-07-31 LAB — POCT GLUCOSE (DEVICE FOR HOME USE): GLUCOSE FASTING, POC: 162 mg/dL — AB (ref 70–99)

## 2015-07-31 LAB — POCT INR: INR: 2.1

## 2015-07-31 LAB — POCT GLYCOSYLATED HEMOGLOBIN (HGB A1C): HEMOGLOBIN A1C: 8.3

## 2015-07-31 NOTE — Progress Notes (Signed)
Patient ID: [name], female   DOB: [birth year], 82 y.o.   MRN: [id number]    Reason for Appointment: Follow-up for Type 2 Diabetes  Referring physician: Jordan Hawks Rankins  History of Present Illness:          Diagnosis: Type 2 diabetes mellitus, date of diagnosis: ? 4 years ago        Past history: She was initially treated with Metformin but this was done because it caused GI side effects  May also tried Actos at some point, probably started because of the fear of side effects but did not know if it helped Actos stopped ? Reason Over the last few years she has been taking Precose with meals and also Januvia in increasing doses Apparently her blood sugars have been higher since 9/14 when she had abdominal surgery and before that they were as low as 70. On her initial consultation in 6/15 because of her renal dysfunction her medication regimen was changed. Previously was taking Januvia, Precose and glipizide without adequate control; her blood sugars were occasionally over 200 fasting and A1c was 9.4 She was started on Victoza 0.6 mg and Lantus insulin 8 units a day but the Lantus had to be stopped because of relatively low fasting glucose.  Since she had tried Prandin and glipizide and her postprandial readings were as high as 330 she was started on Humalog at mealtimes in 8/15  Recent history:   INSULIN regimen: Humalog 7 units U acb, 7  acs  She has been taking small doses of Humalog  at breakfast and supper  Also taking Victoza which is controlling her fasting readings  Her blood sugars are poorly controlled and now she states that she had high readings in March and April when she got prednisone for gout  Current blood sugar patterns and problems identified:  Her A1c is much higher at 8.3, previously 6.9  She did not bring her monitor for download today  Again she checks her blood sugars mostly at bedtime  She thinks her blood sugars are not recently  consistently high but frequently over 170-180  She had readings over 200 and approaching 300 when she was taking prednisone previously  She thinks her fasting readings are not high when she checks them  No readings after lunch or breakfast but her glucose after breakfast today's 163  Has not checked any readings in the mornings or after breakfast lately  No hypoglycemia reported  She eats a small lunch usually but sometimes will eat a full sandwich and does not check her blood sugars in the afternoon  Her weight fluctuates based on her edema level  She has a relatively large breakfast, sometimes high in fat also  Usually eating a 1/2 sandwich at lunch       Oral hypoglycemic drugs the patient is taking are: none  Side effects from medications have been: None  Glucose monitoring:  done once  a day        Glucometer:  One Touch Verio Readings by recall:  Mean values apply above for all meters except median for One Touch  PRE-MEAL Fasting Lunch Dinner Bedtime Overall  Glucose range: 108-131   170-202   Mean/median:         Self-care:   Meals: 3 meals per day.   Breakfast: eggs, grits,meat;  sometimes will have spaghetti or rice at supper. Dinner 6 pm           Exercise:  none, unable  to         Dietician visit: Most recent: Unknown.                Weight history:  Wt Readings from Last 3 Encounters:  07/31/15 193 lb 9.6 oz (87.816 kg)  06/15/15 200 lb (90.719 kg)  05/01/15 204 lb 6.4 oz (92.715 kg)   Glycemic control:  Lab Results  Component Value Date   HGBA1C 8.3 07/31/2015   HGBA1C 6.9 05/01/2015   HGBA1C 7.6 01/30/2015   Lab Results  Component Value Date   MICROALBUR 5.9* 07/27/2014   LDLCALC 41 10/27/2014   CREATININE 1.93* 05/01/2015       Medication List       This list is accurate as of: 07/31/15  1:05 PM.  Always use your most recent med list.               amiodarone 200 MG tablet  Commonly known as:  PACERONE  TAKE 1 TABLET (200 MG TOTAL)  BY MOUTH DAILY.     amLODipine 10 MG tablet  Commonly known as:  NORVASC  Take 10 mg by mouth daily.     atorvastatin 40 MG tablet  Commonly known as:  LIPITOR  Take 40 mg by mouth daily.     B-D UF III MINI PEN NEEDLES 31G X 5 MM Misc  Generic drug:  Insulin Pen Needle  USE 3 PEN NEEDLES PER DAY     cefUROXime 250 MG tablet  Commonly known as:  CEFTIN  Take 1 tablet (250 mg total) by mouth 2 (two) times daily with a meal.     CENTRUM SILVER ADULT 50+ Tabs  Take 1 tablet by mouth daily.     cephALEXin 250 MG capsule  Commonly known as:  KEFLEX  Take 250 mg by mouth as directed. Reported on 07/31/2015     Cranberry 250 MG Caps  Take 250 mg by mouth daily.     econazole nitrate 1 % cream  Apply 1 application topically daily as needed (to groin rash as needed).     esomeprazole 20 MG capsule  Commonly known as:  NEXIUM  Take 20 mg by mouth daily before breakfast.     ferrous sulfate 325 (65 FE) MG tablet  Take 325 mg by mouth daily with breakfast.     fluticasone 50 MCG/ACT nasal spray  Commonly known as:  FLONASE  Place 2 sprays into the nose daily as needed for allergies.     furosemide 80 MG tablet  Commonly known as:  LASIX  Take 80 mg by mouth 2 (two) times daily. 1 am     HUMALOG KWIKPEN 100 UNIT/ML KiwkPen  Generic drug:  insulin lispro  Inject 7 units in am and 8 units in pm     KLOR-CON 10 10 MEQ tablet  Generic drug:  potassium chloride  TAKE 3 TABLETS BY MOUTH EVERY MORNING AND TAKE 3 TABLETS BY MOUTH EVERY EVENING     metolazone 5 MG tablet  Commonly known as:  ZAROXOLYN  Take 5 mg by mouth See admin instructions.     metoprolol 50 MG tablet  Commonly known as:  LOPRESSOR  Take 50 mg by mouth 2 (two) times daily.     ONETOUCH DELICA LANCETS FINE Misc  Use to check blood sugar 2 times per day dx code 250.02     ONETOUCH VERIO test strip  Generic drug:  glucose blood  USE ONE STRIP TO CHECK GLUCOSE TWICE DAILY  AS DIRECTED     pantoprazole 40  MG tablet  Commonly known as:  PROTONIX  Take 40 mg by mouth daily.     polyethylene glycol packet  Commonly known as:  MIRALAX / GLYCOLAX  Take 17 g by mouth daily as needed for mild constipation.     traMADol 50 MG tablet  Commonly known as:  ULTRAM  as needed.     VICTOZA 18 MG/3ML Sopn  Generic drug:  Liraglutide  INJECT 1.2MG  SUBCUTANEOUSLY DAILY (WILL PAY 8/20)     warfarin 5 MG tablet  Commonly known as:  COUMADIN  TAKE AS DIRECTED BY COUMADIN CLINIC        Allergies:  Allergies  Allergen Reactions  . Lotensin [Benazepril Hcl] Anaphylaxis  . Zantac [Ranitidine Hcl] Anaphylaxis  . Tape Itching and Rash    Please use "paper" tape only.    Past Medical History  Diagnosis Date  . Hypertension   . Diabetes mellitus without complication ([place])   . Irregular heart beat   . Renal disorder   . Ileus, postoperative 12/17/2012  . Fall at home Sept. 3, 2016    Fx  3 ribs    Past Surgical History  Procedure Laterality Date  . Hernia repair    . Right lumpectomy    . Cataract extraction, bilateral    . Ventral hernia repair N/A 12/12/2012    Procedure: LAPAROSCOPIC VENTRAL HERNIA;  Surgeon: Madilyn Hook, DO;  Location: [facility];  Service: General;  Laterality: N/A;  . Insertion of mesh N/A 12/12/2012    Procedure: INSERTION OF MESH;  Surgeon: Madilyn Hook, DO;  Location: [facility];  Service: General;  Laterality: N/A;  . Laparoscopic lysis of adhesions N/A 12/12/2012    Procedure: LAPAROSCOPIC LYSIS OF ADHESIONS;  Surgeon: Madilyn Hook, DO;  Location: [facility];  Service: General;  Laterality: N/A;    Family History  Problem Relation Age of Onset  . Diabetes Mother   . Diabetes Sister     Social History:  reports that she has quit smoking. Her smoking use included Cigarettes. She has never used smokeless tobacco. She reports that she does not drink alcohol or use illicit drugs.    Review of Systems   He has history of gout, had a flare in March      Lipids: She has  been on high dose Lipitor for hyperlipidemia        Lab Results  Component Value Date   CHOL 114 10/27/2014   HDL 51.60 10/27/2014   LDLCALC 41 10/27/2014   LDLDIRECT 37.8 10/27/2013   TRIG 109.0 10/27/2014   CHOLHDL 2 10/27/2014       The blood pressure has been treated with multiple medicationsFollowed by other physicians  On amiodarone with normal thyroid functions:  Lab Results  Component Value Date   TSH 1.06 06/15/2015       She has had history of swelling of legs, mostly on the left and is on Lasix;managed by PCP and nephrologist, recently better.      Chronic kidney disease followed by nephrologist regularly:    Lab Results  Component Value Date   CREATININE 1.93* 05/01/2015   Last diabetic foot exam in 2/17     She has weakness in her legs since 2014; needs a walker and cannot walk very long  Physical Examination:  BP 127/60 mmHg  Pulse 60  Temp(Src) 98 F (36.7 C)  Resp 14  Ht 5\' 6"  (1.676 m)  Wt 193 lb  9.6 oz (87.816 kg)  BMI 31.26 kg/m2  SpO2 98%    ASSESSMENT:  Diabetes type 2, uncontrolled   See history of present illness for detailed discussion of his current management, blood sugar patterns and problems identified  She has had a  long history of diabetes, now treated with Victoza and  mealtime insulin at breakfast and supper   Although her A1c had been reasonably good in below 7% previously now this has gone up significantly to over 8% now  She did not bring her monitor and not clear what her blood sugar patterns are Most likely she had much higher readings when she was taking prednisone on a couple of occasions in March and April for gout  However she thinks that she still has relatively high readings after supper Not clear if she has high readings after lunch periodically when she is eating more carbohydrate Glucose recently good after breakfast today She reports fairly good fasting readings Overall she thinks she is doing well on her  diet  PLAN:   Start monitoring blood sugars at home more consistently at various times, discussed targets She will at least increase her suppertime dose by 1-2 units Discussed starting insulin at lunch if postprandial readings are over 180 especially if eating a full sandwich Discussed adjustment of insulin if she ever has to take prednisone again Will need short-term follow-up in 6 weeks  Patient Instructions  Supper dose 8-9 units, not 7  More sugars at 2-4 pm, if >180 then start 4 units at lunch  With prednisone , need extra extra 4-6 units MORE       Counseling time on subjects discussed above is over 50% of today's 25 minute visit  Obdulio Mash 07/31/2015, 1:05 PM   Note: This office note was prepared with Estate agent. Any transcriptional errors that result from this process are unintentional.

## 2015-07-31 NOTE — Patient Instructions (Addendum)
Supper dose 8-9 units, not 7  More sugars at 2-4 pm, if >180 then start 4 units at lunch  With prednisone , need extra extra 4-6 units MORE

## 2015-08-07 DIAGNOSIS — N2581 Secondary hyperparathyroidism of renal origin: Secondary | ICD-10-CM | POA: Diagnosis not present

## 2015-08-07 DIAGNOSIS — D631 Anemia in chronic kidney disease: Secondary | ICD-10-CM | POA: Diagnosis not present

## 2015-08-07 DIAGNOSIS — N189 Chronic kidney disease, unspecified: Secondary | ICD-10-CM | POA: Diagnosis not present

## 2015-08-07 DIAGNOSIS — N183 Chronic kidney disease, stage 3 (moderate): Secondary | ICD-10-CM | POA: Diagnosis not present

## 2015-09-11 ENCOUNTER — Encounter: Payer: Self-pay | Admitting: Endocrinology

## 2015-09-11 ENCOUNTER — Other Ambulatory Visit: Payer: Self-pay | Admitting: Interventional Cardiology

## 2015-09-11 ENCOUNTER — Ambulatory Visit (INDEPENDENT_AMBULATORY_CARE_PROVIDER_SITE_OTHER): Payer: Medicare Other | Admitting: Endocrinology

## 2015-09-11 VITALS — BP 126/62 | HR 64 | Ht 66.0 in | Wt 193.0 lb

## 2015-09-11 DIAGNOSIS — E1165 Type 2 diabetes mellitus with hyperglycemia: Secondary | ICD-10-CM

## 2015-09-11 DIAGNOSIS — Z794 Long term (current) use of insulin: Secondary | ICD-10-CM

## 2015-09-11 LAB — MICROALBUMIN / CREATININE URINE RATIO
CREATININE, U: 40.5 mg/dL
MICROALB UR: 0.7 mg/dL (ref 0.0–1.9)
MICROALB/CREAT RATIO: 1.7 mg/g (ref 0.0–30.0)

## 2015-09-11 LAB — LIPID PANEL
CHOLESTEROL: 125 mg/dL (ref 0–200)
HDL: 56.7 mg/dL (ref >39.00)
LDL CALC: 51 mg/dL (ref 0–99)
NonHDL: 68.67
TRIGLYCERIDES: 86 mg/dL (ref 0.0–149.0)
Total CHOL/HDL Ratio: 2
VLDL: 17.2 mg/dL (ref 0.0–40.0)

## 2015-09-11 LAB — BASIC METABOLIC PANEL
BUN: 44 mg/dL — AB (ref 6–23)
CHLORIDE: 101 meq/L (ref 96–112)
CO2: 29 meq/L (ref 19–32)
Calcium: 9.6 mg/dL (ref 8.4–10.5)
Creatinine, Ser: 2.13 mg/dL — ABNORMAL HIGH (ref 0.40–1.20)
GFR: 28.68 mL/min — AB (ref >60.00)
GLUCOSE: 102 mg/dL — AB (ref 70–99)
POTASSIUM: 3.1 meq/L — AB (ref 3.5–5.1)
SODIUM: 139 meq/L (ref 135–145)

## 2015-09-11 LAB — URINALYSIS, ROUTINE W REFLEX MICROSCOPIC
Bilirubin Urine: NEGATIVE
HGB URINE DIPSTICK: NEGATIVE
Ketones, ur: NEGATIVE
Leukocytes, UA: NEGATIVE
NITRITE: NEGATIVE
Total Protein, Urine: NEGATIVE
URINE GLUCOSE: NEGATIVE
Urobilinogen, UA: 0.2 (ref 0.0–1.0)
pH: 6 (ref 5.0–8.0)

## 2015-09-11 NOTE — Patient Instructions (Signed)
Check blood sugars on waking up  1-2 times a week  Also check blood sugars about 2 hours after a meal and do this after different meals by rotation  Recommended blood sugar levels on waking up is 90-130 and about 2 hours after meal is 130-180  Please bring your blood sugar monitor to each visit, thank you

## 2015-09-11 NOTE — Progress Notes (Signed)
Patient ID: [name], female   DOB: 09/08/1933, 82 y.o.   MRN: [id number]    Reason for Appointment: Follow-up for Type 2 Diabetes  Referring physician: Jordan Hawks Rankins  History of Present Illness:          Diagnosis: Type 2 diabetes mellitus, date of diagnosis: ? 51 years ago        Past history: She was initially treated with Metformin but this was done because it caused GI side effects  May also tried Actos at some point, probably started because of the fear of side effects but did not know if it helped Actos stopped ? Reason Over the last few years she has been taking Precose with meals and also Januvia in increasing doses Apparently her blood sugars have been higher since 9/14 when she had abdominal surgery and before that they were as low as 70. On her initial consultation in 6/15 because of her renal dysfunction her medication regimen was changed. Previously was taking Januvia, Precose and glipizide without adequate control; her blood sugars were occasionally over 200 fasting and A1c was 9.4 She was started on Victoza 0.6 mg and Lantus insulin 8 units a day but the Lantus had to be stopped because of relatively low fasting glucose.  Since she had tried Prandin and glipizide and her postprandial readings were as high as 330 she was started on Humalog at mealtimes in 8/15  Recent history:   INSULIN regimen: Humalog 7 units U acb, 7  acs  She has been taking small doses of Humalog  at breakfast and supper  Also taking Victoza at bedtime which is usually controlling her fasting readings  She is here for short-term follow-up, previously had poor control related to taking steroids with A1c of 8.3  Current blood sugar patterns, current management  and problems identified:  She did bring  her monitor for download today  Again she checks her blood sugars mostly at bedtime Which is about 2-3 hours after eating   She has variable readings although recently they have  been mostly around 150-170  Has to readings after breakfast or on 117  No readings in the morning  She is still taking the same amount of Humalog even though she was told to take 8 units at supper  She has a relatively large breakfast, sometimes high in fat also  Usually eating a 1/2 sandwich at lunch       Oral hypoglycemic drugs the patient is taking are: none  Side effects from medications have been: None  Glucose monitoring:  done once  a day        Glucometer:  One Touch Verio Readings by   Mean values apply above for all meters except median for One Touch  PRE-MEAL Fasting Lunch Dinner PCS Overall  Glucose range:       Mean/median:     173   POST-MEAL PC Breakfast PC Lunch PC Dinner  Glucose range:     Mean/median:      :  Mean values apply above for all meters except median for One Touch  PRE-MEAL Fasting Lunch Dinner Bedtime Overall  Glucose range: 108-131   170-202   Mean/median:         Self-care:   Meals: 3 meals per day.   Breakfast: eggs, grits,meat;  sometimes will have spaghetti or rice at supper. Dinner 6 pm           Exercise:  none, unable to  Dietician visit: Most recent: Unknown.                Weight history:  Wt Readings from Last 3 Encounters:  09/11/15 193 lb (87.544 kg)  07/31/15 193 lb 9.6 oz (87.816 kg)  06/15/15 200 lb (90.719 kg)   Glycemic control:  Lab Results  Component Value Date   HGBA1C 8.3 07/31/2015   HGBA1C 6.9 05/01/2015   HGBA1C 7.6 01/30/2015   Lab Results  Component Value Date   MICROALBUR 5.9* 07/27/2014   LDLCALC 41 10/27/2014   CREATININE 1.93* 05/01/2015       Medication List       This list is accurate as of: 09/11/15 11:02 AM.  Always use your most recent med list.               amiodarone 200 MG tablet  Commonly known as:  PACERONE  TAKE 1 TABLET (200 MG TOTAL) BY MOUTH DAILY.     amLODipine 10 MG tablet  Commonly known as:  NORVASC  Take 10 mg by mouth daily.     atorvastatin 40  MG tablet  Commonly known as:  LIPITOR  Take 40 mg by mouth daily.     B-D UF III MINI PEN NEEDLES 31G X 5 MM Misc  Generic drug:  Insulin Pen Needle  USE 3 PEN NEEDLES PER DAY     CENTRUM SILVER ADULT 50+ Tabs  Take 1 tablet by mouth daily.     cephALEXin 250 MG capsule  Commonly known as:  KEFLEX  Take 250 mg by mouth as directed. Reported on 09/11/2015     Cranberry 250 MG Caps  Take 250 mg by mouth daily.     econazole nitrate 1 % cream  Apply 1 application topically daily as needed (to groin rash as needed).     esomeprazole 20 MG capsule  Commonly known as:  NEXIUM  Take 20 mg by mouth daily before breakfast.     ferrous sulfate 325 (65 FE) MG tablet  Take 325 mg by mouth daily with breakfast.     fluticasone 50 MCG/ACT nasal spray  Commonly known as:  FLONASE  Place 2 sprays into the nose daily as needed for allergies.     furosemide 80 MG tablet  Commonly known as:  LASIX  Take 80 mg by mouth 2 (two) times daily. 1 am     HUMALOG KWIKPEN 100 UNIT/ML KiwkPen  Generic drug:  insulin lispro  Inject 7 units in am and 8 units in pm     KLOR-CON 10 10 MEQ tablet  Generic drug:  potassium chloride  TAKE 3 TABLETS BY MOUTH EVERY MORNING AND TAKE 3 TABLETS BY MOUTH EVERY EVENING     metolazone 5 MG tablet  Commonly known as:  ZAROXOLYN  Take 5 mg by mouth See admin instructions.     metoprolol 50 MG tablet  Commonly known as:  LOPRESSOR  Take 50 mg by mouth 2 (two) times daily.     ONETOUCH DELICA LANCETS FINE Misc  Use to check blood sugar 2 times per day dx code 250.02     ONETOUCH VERIO test strip  Generic drug:  glucose blood  USE ONE STRIP TO CHECK GLUCOSE TWICE DAILY AS DIRECTED     pantoprazole 40 MG tablet  Commonly known as:  PROTONIX  Take 40 mg by mouth daily.     polyethylene glycol packet  Commonly known as:  MIRALAX / GLYCOLAX  Take 17 g  by mouth daily as needed for mild constipation.     traMADol 50 MG tablet  Commonly known as:  ULTRAM   as needed. Reported on 09/11/2015     VICTOZA 18 MG/3ML Sopn  Generic drug:  Liraglutide  INJECT 1.2MG  SUBCUTANEOUSLY DAILY (WILL PAY 8/20)     warfarin 5 MG tablet  Commonly known as:  COUMADIN  TAKE AS DIRECTED BY COUMADIN CLINIC        Allergies:  Allergies  Allergen Reactions  . Lotensin [Benazepril Hcl] Anaphylaxis  . Zantac [Ranitidine Hcl] Anaphylaxis  . Tape Itching and Rash    Please use "paper" tape only.    Past Medical History  Diagnosis Date  . Hypertension   . Diabetes mellitus without complication ([place])   . Irregular heart beat   . Renal disorder   . Ileus, postoperative 12/17/2012  . Fall at home Sept. 3, 2016    Fx  3 ribs    Past Surgical History  Procedure Laterality Date  . Hernia repair    . Right lumpectomy    . Cataract extraction, bilateral    . Ventral hernia repair N/A 12/12/2012    Procedure: LAPAROSCOPIC VENTRAL HERNIA;  Surgeon: Madilyn Hook, DO;  Location: [facility];  Service: General;  Laterality: N/A;  . Insertion of mesh N/A 12/12/2012    Procedure: INSERTION OF MESH;  Surgeon: Madilyn Hook, DO;  Location: [facility];  Service: General;  Laterality: N/A;  . Laparoscopic lysis of adhesions N/A 12/12/2012    Procedure: LAPAROSCOPIC LYSIS OF ADHESIONS;  Surgeon: Madilyn Hook, DO;  Location: [facility];  Service: General;  Laterality: N/A;    Family History  Problem Relation Age of Onset  . Diabetes Mother   . Diabetes Sister     Social History:  reports that she has quit smoking. Her smoking use included Cigarettes. She has never used smokeless tobacco. She reports that she does not drink alcohol or use illicit drugs.    Review of Systems        Lipids: She has been on high dose Lipitor for hyperlipidemia        Lab Results  Component Value Date   CHOL 114 10/27/2014   HDL 51.60 10/27/2014   LDLCALC 41 10/27/2014   LDLDIRECT 37.8 10/27/2013   TRIG 109.0 10/27/2014   CHOLHDL 2 10/27/2014       The blood pressure has been treated  with multiple medicationsFollowed by other physicians  On amiodarone with normal thyroid functions:  Lab Results  Component Value Date   TSH 1.06 06/15/2015       She has had history of swelling of legs, mostly on the left and is on Lasix;managed by PCP and nephrologist, recently better.      Chronic kidney disease followed by nephrologist regularly, Last visit was in 5/17 and no changes made:    Lab Results  Component Value Date   CREATININE 1.93* 05/01/2015   Last diabetic foot exam in 2/17     She has weakness in her legs since 2014; needs a walker and cannot walk very long  Physical Examination:  BP 126/62 mmHg  Pulse 64  Ht 5\' 6"  (1.676 m)  Wt 193 lb (87.544 kg)  BMI 31.17 kg/m2  SpO2 97%    ASSESSMENT:  Diabetes type 2, uncontrolled   See history of present illness for detailed discussion of his current management, blood sugar patterns and problems identified  She has had a  long history of diabetes, Usually  Fairly well controlled  with Victoza and  mealtime insulin at breakfast and supper  More recently with her being off steroids her blood sugars appear to be better but again she is only checking readings after supper not clear what her overall control is She is usually watching her diet Has sporadic high readings over 200 at night but not consistently Glucose after breakfast about 170, checked only twice   RENAL dysfunction: Followed by nephrologist  PLAN:   Start monitoring blood sugars at home more consistently at various times including after breakfast or lunch, discussed keeping up with this using a diary as she tends to forget Will also need to make sure she is still having good readings in the morning Fructosamine to be checked today Also needs follow-up of her lipids  Patient Instructions  Check blood sugars on waking up  1-2 times a week  Also check blood sugars about 2 hours after a meal and do this after different meals by  rotation  Recommended blood sugar levels on waking up is 90-130 and about 2 hours after meal is 130-180  Please bring your blood sugar monitor to each visit, thank you      [facility] 09/11/2015, 11:02 AM   Note: This office note was prepared with Dragon voice recognition system technology. Any transcriptional errors that result from this process are unintentional.

## 2015-09-12 LAB — FRUCTOSAMINE: Fructosamine: 327 umol/L — ABNORMAL HIGH (ref 0–285)

## 2015-09-14 ENCOUNTER — Ambulatory Visit (INDEPENDENT_AMBULATORY_CARE_PROVIDER_SITE_OTHER): Payer: Medicare Other

## 2015-09-14 DIAGNOSIS — I4891 Unspecified atrial fibrillation: Secondary | ICD-10-CM

## 2015-09-14 DIAGNOSIS — I482 Chronic atrial fibrillation, unspecified: Secondary | ICD-10-CM

## 2015-09-14 DIAGNOSIS — Z5181 Encounter for therapeutic drug level monitoring: Secondary | ICD-10-CM | POA: Diagnosis not present

## 2015-09-14 LAB — POCT INR: INR: 2

## 2015-09-19 ENCOUNTER — Ambulatory Visit (INDEPENDENT_AMBULATORY_CARE_PROVIDER_SITE_OTHER): Payer: Medicare Other | Admitting: Podiatry

## 2015-09-19 ENCOUNTER — Encounter: Payer: Self-pay | Admitting: Podiatry

## 2015-09-19 VITALS — BP 135/60 | HR 69 | Resp 12

## 2015-09-19 DIAGNOSIS — B351 Tinea unguium: Secondary | ICD-10-CM

## 2015-09-19 DIAGNOSIS — M79675 Pain in left toe(s): Secondary | ICD-10-CM

## 2015-09-19 DIAGNOSIS — M79674 Pain in right toe(s): Secondary | ICD-10-CM

## 2015-09-19 NOTE — Progress Notes (Signed)
   Subjective:    Patient ID: [name], female    DOB: [birth year], 82 y.o.   MRN: [id number]  HPI   This patient presents today with her daughter present in the treatment room. Patient is complaining of elongated and thickened toenails that are a cough or walking wearing shoes and requests toenail debridement. She has some previous podiatric care, however not recently. Patient is a diabetic and denies any history of ulceration, claudication or amputation   Review of Systems  HENT: Positive for hearing loss and sinus pressure.   Skin: Positive for color change.       Objective:   Physical Exam  Orientated 3  Vascular: There is no calf pain or calf tenderness to palpation bilaterally Bilateral peripheral edema bilaterally DP pulses 0/4 bilaterally PT pulses 0/4 bilaterally Capillary reflex delay bilaterally  Neurological: Sensation to 10 g monofilament wire intact 5/5 right and 4/5 left Vibratory sensation nonreactive bilaterally Ankle reflexes equal reactive bilaterally  Dermatological: No open skin lesions bilaterally Toenails are elongated, brittle, deformed, discolored and tender direct palpation 6-10  Musculoskeletal: Pes planus bilaterally There is no restriction ankle, subtalar, midtarsal joints bilaterally        Assessment & Plan:   Assessment: Type II diabetic with absent pedal pulses No history of foot ulceration Decrease sensation to tuning fork Symptomatic onychomycoses 6-10  Plan: Today I reviewed the results of exam with patient and daughter. At this time patient has had no history of foot ulceration and denies claudication and will continue surveillance at three-month intervals The toenails 6-10 were debrided mechanically and left without any bleeding  Reappoint as 3 months

## 2015-09-19 NOTE — Patient Instructions (Signed)
Purchase a heel protector to be worn at night as needed  Diabetes and Foot Care Diabetes may cause you to have problems because of poor blood supply (circulation) to your feet and legs. This may cause the skin on your feet to become thinner, break easier, and heal more slowly. Your skin may become dry, and the skin may peel and crack. You may also have nerve damage in your legs and feet causing decreased feeling in them. You may not notice minor injuries to your feet that could lead to infections or more serious problems. Taking care of your feet is one of the most important things you can do for yourself.  HOME CARE INSTRUCTIONS  Wear shoes at all times, even in the house. Do not go barefoot. Bare feet are easily injured.  Check your feet daily for blisters, cuts, and redness. If you cannot see the bottom of your feet, use a mirror or ask someone for help.  Wash your feet with warm water (do not use hot water) and mild soap. Then pat your feet and the areas between your toes until they are completely dry. Do not soak your feet as this can dry your skin.  Apply a moisturizing lotion or petroleum jelly (that does not contain alcohol and is unscented) to the skin on your feet and to dry, brittle toenails. Do not apply lotion between your toes.  Trim your toenails straight across. Do not dig under them or around the cuticle. File the edges of your nails with an emery board or nail file.  Do not cut corns or calluses or try to remove them with medicine.  Wear clean socks or stockings every day. Make sure they are not too tight. Do not wear knee-high stockings since they may decrease blood flow to your legs.  Wear shoes that fit properly and have enough cushioning. To break in new shoes, wear them for just a few hours a day. This prevents you from injuring your feet. Always look in your shoes before you put them on to be sure there are no objects inside.  Do not cross your legs. This may decrease  the blood flow to your feet.  If you find a minor scrape, cut, or break in the skin on your feet, keep it and the skin around it clean and dry. These areas may be cleansed with mild soap and water. Do not cleanse the area with peroxide, alcohol, or iodine.  When you remove an adhesive bandage, be sure not to damage the skin around it.  If you have a wound, look at it several times a day to make sure it is healing.  Do not use heating pads or hot water bottles. They may burn your skin. If you have lost feeling in your feet or legs, you may not know it is happening until it is too late.  Make sure your health care provider performs a complete foot exam at least annually or more often if you have foot problems. Report any cuts, sores, or bruises to your health care provider immediately. SEEK MEDICAL CARE IF:   You have an injury that is not healing.  You have cuts or breaks in the skin.  You have an ingrown nail.  You notice redness on your legs or feet.  You feel burning or tingling in your legs or feet.  You have pain or cramps in your legs and feet.  Your legs or feet are numb.  Your feet always feel  cold. SEEK IMMEDIATE MEDICAL CARE IF:   There is increasing redness, swelling, or pain in or around a wound.  There is a red line that goes up your leg.  Pus is coming from a wound.  You develop a fever or as directed by your health care provider.  You notice a bad smell coming from an ulcer or wound.   This information is not intended to replace advice given to you by your health care provider. Make sure you discuss any questions you have with your health care provider.   Document Released: 03/07/2000 Document Revised: 11/10/2012 Document Reviewed: 08/17/2012 Elsevier Interactive Patient Education [employer].

## 2015-10-12 DIAGNOSIS — M719 Bursopathy, unspecified: Secondary | ICD-10-CM | POA: Diagnosis not present

## 2015-10-12 DIAGNOSIS — M109 Gout, unspecified: Secondary | ICD-10-CM | POA: Diagnosis not present

## 2015-10-15 ENCOUNTER — Telehealth: Payer: Self-pay | Admitting: Endocrinology

## 2015-10-15 ENCOUNTER — Telehealth: Payer: Self-pay

## 2015-10-15 NOTE — Telephone Encounter (Signed)
Patient stated  Her dr put her on prednisone for 5 days.FYI

## 2015-10-15 NOTE — Telephone Encounter (Signed)
Called patient to notify of insulin changes, no answer and no voicemail to leave messages. Will try again later.

## 2015-10-15 NOTE — Telephone Encounter (Signed)
Please have check her blood sugar with for every meal and have her increase her Humalog insulin with each meal as follows:  Blood sugar 150-200: Extra 3 units 150-250 = extra 5 units Over 250: Extra 7 units Extra doses are on top of her usual dose

## 2015-10-16 ENCOUNTER — Telehealth: Payer: Self-pay

## 2015-10-16 NOTE — Telephone Encounter (Signed)
Tried to notify patient again of insulin changes. Patient did not answer, called twice. Will try again later.

## 2015-10-17 ENCOUNTER — Other Ambulatory Visit: Payer: Self-pay

## 2015-10-17 ENCOUNTER — Telehealth: Payer: Self-pay | Admitting: Endocrinology

## 2015-10-17 MED ORDER — GLUCOSE BLOOD VI STRP: ORAL_STRIP | 3 refills | Status: DC

## 2015-10-17 NOTE — Telephone Encounter (Signed)
PT needs to switch her test strip prescription to the [facility] on [address]

## 2015-10-17 NOTE — Telephone Encounter (Signed)
Test strips sent into patient pharmacy that was requested.

## 2015-10-18 ENCOUNTER — Telehealth: Payer: Self-pay

## 2015-10-18 NOTE — Telephone Encounter (Signed)
Tried to reach patient again about increasing insulin, no answer and no voicemail to leave message.

## 2015-10-19 ENCOUNTER — Other Ambulatory Visit: Payer: Self-pay

## 2015-10-19 DIAGNOSIS — E1022 Type 1 diabetes mellitus with diabetic chronic kidney disease: Secondary | ICD-10-CM

## 2015-10-19 MED ORDER — GLUCOSE BLOOD VI STRP: ORAL_STRIP | 3 refills | Status: DC

## 2015-10-22 ENCOUNTER — Other Ambulatory Visit: Payer: Self-pay

## 2015-10-22 ENCOUNTER — Telehealth: Payer: Self-pay | Admitting: Endocrinology

## 2015-10-22 DIAGNOSIS — E1022 Type 1 diabetes mellitus with diabetic chronic kidney disease: Secondary | ICD-10-CM

## 2015-10-22 MED ORDER — GLUCOSE BLOOD VI STRP: ORAL_STRIP | 3 refills | Status: DC

## 2015-10-22 NOTE — Telephone Encounter (Signed)
Rx submitted per pt's request.  

## 2015-10-22 NOTE — Telephone Encounter (Signed)
PT needs test strips sent to [facility] on [address]

## 2015-10-24 ENCOUNTER — Other Ambulatory Visit: Payer: Self-pay

## 2015-10-24 DIAGNOSIS — E1022 Type 1 diabetes mellitus with diabetic chronic kidney disease: Secondary | ICD-10-CM

## 2015-10-24 DIAGNOSIS — E119 Type 2 diabetes mellitus without complications: Secondary | ICD-10-CM | POA: Diagnosis not present

## 2015-10-24 MED ORDER — GLUCOSE BLOOD VI STRP: ORAL_STRIP | 3 refills | Status: DC

## 2015-10-26 ENCOUNTER — Ambulatory Visit (INDEPENDENT_AMBULATORY_CARE_PROVIDER_SITE_OTHER): Payer: Medicare Other | Admitting: Pharmacist

## 2015-10-26 ENCOUNTER — Encounter (INDEPENDENT_AMBULATORY_CARE_PROVIDER_SITE_OTHER): Payer: Self-pay

## 2015-10-26 DIAGNOSIS — Z5181 Encounter for therapeutic drug level monitoring: Secondary | ICD-10-CM | POA: Diagnosis not present

## 2015-10-26 DIAGNOSIS — I4891 Unspecified atrial fibrillation: Secondary | ICD-10-CM

## 2015-10-26 LAB — POCT INR: INR: 2.6

## 2015-11-29 ENCOUNTER — Other Ambulatory Visit: Payer: Self-pay | Admitting: Interventional Cardiology

## 2015-12-07 ENCOUNTER — Ambulatory Visit (INDEPENDENT_AMBULATORY_CARE_PROVIDER_SITE_OTHER): Payer: Medicare Other | Admitting: *Deleted

## 2015-12-07 DIAGNOSIS — Z5181 Encounter for therapeutic drug level monitoring: Secondary | ICD-10-CM | POA: Diagnosis not present

## 2015-12-07 DIAGNOSIS — I4891 Unspecified atrial fibrillation: Secondary | ICD-10-CM | POA: Diagnosis not present

## 2015-12-07 LAB — POCT INR: INR: 1.8

## 2015-12-11 ENCOUNTER — Telehealth: Payer: Self-pay | Admitting: Endocrinology

## 2015-12-11 ENCOUNTER — Ambulatory Visit (INDEPENDENT_AMBULATORY_CARE_PROVIDER_SITE_OTHER): Payer: Medicare Other | Admitting: Endocrinology

## 2015-12-11 ENCOUNTER — Other Ambulatory Visit: Payer: Self-pay | Admitting: Endocrinology

## 2015-12-11 ENCOUNTER — Encounter: Payer: Self-pay | Admitting: Endocrinology

## 2015-12-11 VITALS — BP 115/50 | HR 54 | Ht 65.0 in | Wt 186.0 lb

## 2015-12-11 DIAGNOSIS — E1165 Type 2 diabetes mellitus with hyperglycemia: Secondary | ICD-10-CM | POA: Diagnosis not present

## 2015-12-11 DIAGNOSIS — N184 Chronic kidney disease, stage 4 (severe): Secondary | ICD-10-CM | POA: Diagnosis not present

## 2015-12-11 DIAGNOSIS — I10 Essential (primary) hypertension: Secondary | ICD-10-CM | POA: Diagnosis not present

## 2015-12-11 DIAGNOSIS — Z23 Encounter for immunization: Secondary | ICD-10-CM | POA: Diagnosis not present

## 2015-12-11 DIAGNOSIS — Z794 Long term (current) use of insulin: Secondary | ICD-10-CM

## 2015-12-11 LAB — COMPREHENSIVE METABOLIC PANEL
ALBUMIN: 3.7 g/dL (ref 3.5–5.2)
ALK PHOS: 48 U/L (ref 39–117)
ALT: 54 U/L — AB (ref 0–35)
AST: 60 U/L — AB (ref 0–37)
BILIRUBIN TOTAL: 0.5 mg/dL (ref 0.2–1.2)
BUN: 102 mg/dL (ref 6–23)
CALCIUM: 9.5 mg/dL (ref 8.4–10.5)
CO2: 33 mEq/L — ABNORMAL HIGH (ref 19–32)
CREATININE: 3.28 mg/dL — AB (ref 0.40–1.20)
Chloride: 90 mEq/L — ABNORMAL LOW (ref 96–112)
GFR: 17.41 mL/min — AB (ref >60.00)
Glucose, Bld: 96 mg/dL (ref 70–99)
Potassium: 2.2 mEq/L — CL (ref 3.5–5.1)
Sodium: 134 mEq/L — ABNORMAL LOW (ref 135–145)
TOTAL PROTEIN: 7.6 g/dL (ref 6.0–8.3)

## 2015-12-11 LAB — HEMOGLOBIN A1C: Hgb A1c MFr Bld: 8 % — ABNORMAL HIGH (ref 4.6–6.5)

## 2015-12-11 NOTE — Patient Instructions (Signed)
Check sugar at 8-9 pm and if mostly below 140 reduce Humalog to 6-7 units  Amlodipine 1/2 of 10 mg

## 2015-12-11 NOTE — Progress Notes (Signed)
Patient ID: [name], female   DOB: Jan 27, 1934, 82 y.o.   MRN: [id number]    Reason for Appointment: Follow-up for Type 2 Diabetes  Referring physician: Jordan Hawks Rankins  History of Present Illness:          Diagnosis: Type 2 diabetes mellitus, date of diagnosis: ? 48 years ago        Past history: She was initially treated with Metformin but this was done because it caused GI side effects  May also tried Actos at some point, probably started because of the fear of side effects but did not know if it helped Actos stopped ? Reason Over the last few years she has been taking Precose with meals and also Januvia in increasing doses Apparently her blood sugars have been higher since 9/14 when she had abdominal surgery and before that they were as low as 70. On her initial consultation in 6/15 because of her renal dysfunction her medication regimen was changed. Previously was taking Januvia, Precose and glipizide without adequate control; her blood sugars were occasionally over 200 fasting and A1c was 9.4 She was started on Victoza 0.6 mg and Lantus insulin 8 units a day but the Lantus had to be stopped because of relatively low fasting glucose.  Since she had tried Prandin and glipizide and her postprandial readings were as high as 330 she was started on Humalog at mealtimes in 8/15  Recent history:   INSULIN regimen: Humalog 8 units U acb, 7-8  acs  She has been taking small doses of Humalog  at breakfast and supper  Also taking Victoza at bedtime which is usually controlling her fasting readings   Her last A1c was 8.3 compared to 6.9 because of high sugars from steroids  Current blood sugar patterns, current management  and problems identified:  She has had variable blood sugars  Again she checks her blood sugars mostly at bedtime is about 3-5 hours after eating   She does not know why her sugars were higher about 3 weeks ago at night but they are excellent now after  supper in the evenings.  Checking blood sugars only sporadically in the mornings and afternoons  Also had high readings after lunch last month, not taking any insulin to cover her half a sandwich at lunch  She thinks she is eating only one serving of carbohydrate at suppertime and usually will take 7 units, may take 8 units if eating a larger meal  She thinks her blood sugars are low when they are down to 116 or so but not clear if she is symptomatic.  She sometimes feels a little weak at this is nonspecific and no other associated symptoms  She will sometimes reduce her Victoza if she feels her sugars are low normal at night  She has a relatively large breakfast, sometimes high in fat also  Usually eating a 1/2 sandwich at lunch       Oral hypoglycemic drugs the patient is taking are: none  Side effects from medications have been: None  Glucose monitoring:  done once  a day        Glucometer:  One Touch Verio Readings by   Mean values apply above for all meters except median for One Touch  PRE-MEAL Fasting Lunch Dinner PCS Overall  Glucose range: 118-164  108, 152  115-317    Mean/median:    170 164    Self-care:   Meals: 3 meals per day.   Breakfast:  eggs, grits,meat;  sometimes will have spaghetti or rice at supper. Dinner 6 pm           Exercise:  none, unable to         Dietician visit: Most recent: Unknown.                Weight history:  Wt Readings from Last 3 Encounters:  12/11/15 186 lb (84.4 kg)  09/11/15 193 lb (87.5 kg)  07/31/15 193 lb 9.6 oz (87.8 kg)   Glycemic control:  Lab Results  Component Value Date   HGBA1C 8.3 07/31/2015   HGBA1C 6.9 05/01/2015   HGBA1C 7.6 01/30/2015   Lab Results  Component Value Date   MICROALBUR 0.7 09/11/2015   LDLCALC 51 09/11/2015   CREATININE 2.13 (H) 09/11/2015       Medication List       Accurate as of 12/11/15 11:15 AM. Always use your most recent med list.          amiodarone 200 MG tablet Commonly  known as:  PACERONE TAKE 1 TABLET BY MOUTH EVERY DAY   amLODipine 10 MG tablet Commonly known as:  NORVASC Take 10 mg by mouth daily.   atorvastatin 40 MG tablet Commonly known as:  LIPITOR Take 40 mg by mouth daily.   B-D UF III MINI PEN NEEDLES 31G X 5 MM Misc Generic drug:  Insulin Pen Needle USE 3 PEN NEEDLES PER DAY   CENTRUM SILVER ADULT 50+ Tabs Take 1 tablet by mouth daily.   cephALEXin 250 MG capsule Commonly known as:  KEFLEX Take 250 mg by mouth as directed. Reported on 09/11/2015   Cranberry 250 MG Caps Take 250 mg by mouth daily.   econazole nitrate 1 % cream Apply 1 application topically daily as needed (to groin rash as needed).   esomeprazole 20 MG capsule Commonly known as:  NEXIUM Take 20 mg by mouth daily before breakfast.   ferrous sulfate 325 (65 FE) MG tablet Take 325 mg by mouth daily with breakfast.   fluticasone 50 MCG/ACT nasal spray Commonly known as:  FLONASE Place 2 sprays into the nose daily as needed for allergies.   furosemide 80 MG tablet Commonly known as:  LASIX Take 80 mg by mouth 2 (two) times daily. 1 am   glucose blood test strip Commonly known as:  ONETOUCH VERIO USE ONE STRIP TO CHECK GLUCOSE TWICE DAILY AS DIRECTED   HUMALOG KWIKPEN 100 UNIT/ML KiwkPen Generic drug:  insulin lispro Inject 7 units in am and 8 units in pm   KLOR-CON 10 10 MEQ tablet Generic drug:  potassium chloride TAKE 3 TABLETS BY MOUTH EVERY MORNING AND TAKE 3 TABLETS BY MOUTH EVERY EVENING   metolazone 5 MG tablet Commonly known as:  ZAROXOLYN Take 5 mg by mouth See admin instructions.   metoprolol 50 MG tablet Commonly known as:  LOPRESSOR Take 50 mg by mouth 2 (two) times daily.   ONETOUCH DELICA LANCETS FINE Misc Use to check blood sugar 2 times per day dx code 250.02   pantoprazole 40 MG tablet Commonly known as:  PROTONIX Take 40 mg by mouth daily.   polyethylene glycol packet Commonly known as:  MIRALAX / GLYCOLAX Take 17 g by  mouth daily as needed for mild constipation.   traMADol 50 MG tablet Commonly known as:  ULTRAM as needed. Reported on 09/11/2015   VICTOZA 18 MG/3ML Sopn Generic drug:  Liraglutide INJECT 1.2MG  SUBCUTANEOUSLY DAILY (WILL PAY 8/20)   warfarin  5 MG tablet Commonly known as:  COUMADIN TAKE AS DIRECTED BY COUMADIN CLINIC       Allergies:  Allergies  Allergen Reactions  . Lotensin [Benazepril Hcl] Anaphylaxis  . Zantac [Ranitidine Hcl] Anaphylaxis  . Tape Itching and Rash    Please use "paper" tape only.    Past Medical History:  Diagnosis Date  . Diabetes mellitus without complication ([place])   . Fall at home Sept. 3, 2016   Fx  3 ribs  . Hypertension   . Ileus, postoperative 12/17/2012  . Irregular heart beat   . Renal disorder     Past Surgical History:  Procedure Laterality Date  . CATARACT EXTRACTION, BILATERAL    . HERNIA REPAIR    . INSERTION OF MESH N/A 12/12/2012   Procedure: INSERTION OF MESH;  Surgeon: Madilyn Hook, DO;  Location: [facility];  Service: General;  Laterality: N/A;  . LAPAROSCOPIC LYSIS OF ADHESIONS N/A 12/12/2012   Procedure: LAPAROSCOPIC LYSIS OF ADHESIONS;  Surgeon: Madilyn Hook, DO;  Location: [facility];  Service: General;  Laterality: N/A;  . Right Lumpectomy    . VENTRAL HERNIA REPAIR N/A 12/12/2012   Procedure: LAPAROSCOPIC VENTRAL HERNIA;  Surgeon: Madilyn Hook, DO;  Location: [facility];  Service: General;  Laterality: N/A;    Family History  Problem Relation Age of Onset  . Diabetes Mother   . Diabetes Sister     Social History:  reports that she has quit smoking. Her smoking use included Cigarettes. She has never used smokeless tobacco. She reports that she does not drink alcohol or use drugs.    Review of Systems        Lipids: She has been on high dose Lipitor for hyperlipidemia, Has had recent labs done        Lab Results  Component Value Date   CHOL 125 09/11/2015   HDL 56.70 09/11/2015   LDLCALC 51 09/11/2015   LDLDIRECT 37.8  10/27/2013   TRIG 86.0 09/11/2015   CHOLHDL 2 09/11/2015       The blood pressure has been treated with multiple medications including 10 mg amlodipine Followed by other physicians BP at home 125/70, sometimes lower.  At times may feel weak  On amiodarone with normal thyroid functions:  Lab Results  Component Value Date   TSH 1.06 06/15/2015       She has had history of swelling of legs, mostly on the left and is on Lasix;managed by PCP and nephrologist, recently better.      Chronic kidney disease followed by nephrologist regularly, Last visit was in 5/17 and no changes made:    Lab Results  Component Value Date   CREATININE 2.13 (H) 09/11/2015   Last diabetic foot exam in 2/17     She has weakness in her legs since 2014; needs a walker and cannot walk very long  Physical Examination:  BP (!) 115/50   Pulse (!) 54   Ht 5\' 5"  (1.651 m)   Wt 186 lb (84.4 kg)   BMI 30.95 kg/m     ASSESSMENT:  Diabetes type 2, uncontrolled   See history of present illness for detailed discussion of his current management, blood sugar patterns and problems identified  She has had a  long history of diabetes, Reasonably well controlled  with Victoza and  mealtime insulin at breakfast and supper She is usually watching her diet and limiting carbohydrates She is not checking her blood sugars consistently at different times and mostly at bedtime  which is sometimes 5 hours after evening meal Fasting readings are still appearing to be fairly well controlled overall and benefiting from Victoza  RENAL dysfunction: Followed by nephrologist, this has been stable  HYPERTENSION: Her blood pressure is relatively low, checked twice today  PLAN:  Discussed adjusting evening meal coverage based on amounts of carbohydrate Also may be better to have her check her sugars 2-3 hours after evening meals rather than late at night Start monitoring blood sugars at home in the morning or after breakfast or  lunch May need lunchtime coverage if glucose readings are going over 200 frequently She will also call if she is having blood sugars out of the range consistently at night  Check A1c today  For now she will reduce her amlodipine to 5 mg and follow-up with cardiologist next month, discussed that blood pressure may be relatively low for her age and renal function    Counseling time on subjects discussed above is over 50% of today's 25 minute visit   There are no Patient Instructions on file for this visit.   Attikus Bartoszek 12/11/2015, 11:15 AM   Note: This office note was prepared with Estate agent. Any transcriptional errors that result from this process are unintentional.

## 2015-12-11 NOTE — Telephone Encounter (Signed)
Potassium 2.2.  Patient says that she has been irregular with her potassium and does not have any. Advised her To hold off on any fluid pills until at least tomorrow, take a total of 6 potassium pills the night and resume regular treatment tomorrow.  Needs to follow-up with her PCP and nephrologist

## 2015-12-12 NOTE — Telephone Encounter (Signed)
Please change Rx as needed

## 2015-12-12 NOTE — Telephone Encounter (Signed)
Patient has some concerns about her medication and the dosage, she is short every month don't have enough med to last her the month.  Please advise

## 2015-12-13 NOTE — Telephone Encounter (Signed)
No answer when I tried calling her to find out what medicine she needed increased. Will try again today.

## 2015-12-14 ENCOUNTER — Telehealth: Payer: Self-pay | Admitting: *Deleted

## 2015-12-14 DIAGNOSIS — K5900 Constipation, unspecified: Secondary | ICD-10-CM | POA: Diagnosis not present

## 2015-12-14 DIAGNOSIS — M109 Gout, unspecified: Secondary | ICD-10-CM | POA: Diagnosis not present

## 2015-12-14 NOTE — Telephone Encounter (Signed)
Patient was started on Prednisone 20 mg for 5 days, please advise on what she should do for high sugars?

## 2015-12-14 NOTE — Telephone Encounter (Signed)
Patient called, she was started on prenisone 20 mg po 1 daily for 5 days for gout.

## 2015-12-14 NOTE — Telephone Encounter (Signed)
Pt was started on prednisone 20 mg for 5 days

## 2015-12-16 NOTE — Telephone Encounter (Signed)
As before:   Please have check her blood sugar with for every meal and have her increase her Humalog insulin with each meal as follows: Blood sugar 150-200: Extra 3 units 150-250 = extra 5 units Over 250: Extra 7 units Extra doses are on top of her usual dose

## 2015-12-18 ENCOUNTER — Ambulatory Visit (INDEPENDENT_AMBULATORY_CARE_PROVIDER_SITE_OTHER): Payer: Medicare Other | Admitting: *Deleted

## 2015-12-18 DIAGNOSIS — I4891 Unspecified atrial fibrillation: Secondary | ICD-10-CM

## 2015-12-18 DIAGNOSIS — Z5181 Encounter for therapeutic drug level monitoring: Secondary | ICD-10-CM

## 2015-12-18 LAB — POCT INR: INR: 2.8

## 2015-12-20 ENCOUNTER — Other Ambulatory Visit: Payer: Self-pay | Admitting: *Deleted

## 2015-12-20 ENCOUNTER — Other Ambulatory Visit: Payer: Self-pay | Admitting: Endocrinology

## 2015-12-20 MED ORDER — POTASSIUM CHLORIDE ER 10 MEQ PO TBCR: EXTENDED_RELEASE_TABLET | ORAL | 5 refills | Status: DC

## 2015-12-24 ENCOUNTER — Telehealth: Payer: Self-pay | Admitting: Endocrinology

## 2015-12-24 ENCOUNTER — Other Ambulatory Visit: Payer: Self-pay

## 2015-12-24 MED ORDER — POTASSIUM CHLORIDE ER 10 MEQ PO TBCR: EXTENDED_RELEASE_TABLET | ORAL | 5 refills | Status: DC

## 2015-12-24 NOTE — Telephone Encounter (Signed)
Ordered the potassium and called patient to let her know- she voiced an understanding

## 2015-12-24 NOTE — Telephone Encounter (Signed)
Potassium need to be refilled with new directions 3 tabs in AM and 4 tabs in PM  Call into [facility]

## 2015-12-25 ENCOUNTER — Other Ambulatory Visit: Payer: Medicare Other | Admitting: *Deleted

## 2015-12-25 ENCOUNTER — Ambulatory Visit (INDEPENDENT_AMBULATORY_CARE_PROVIDER_SITE_OTHER): Payer: Medicare Other | Admitting: Interventional Cardiology

## 2015-12-25 ENCOUNTER — Encounter: Payer: Self-pay | Admitting: Interventional Cardiology

## 2015-12-25 VITALS — BP 132/56 | HR 61 | Ht 66.0 in | Wt 183.2 lb

## 2015-12-25 DIAGNOSIS — E7800 Pure hypercholesterolemia, unspecified: Secondary | ICD-10-CM

## 2015-12-25 DIAGNOSIS — Z79899 Other long term (current) drug therapy: Secondary | ICD-10-CM

## 2015-12-25 DIAGNOSIS — I480 Paroxysmal atrial fibrillation: Secondary | ICD-10-CM | POA: Diagnosis not present

## 2015-12-25 DIAGNOSIS — N184 Chronic kidney disease, stage 4 (severe): Secondary | ICD-10-CM

## 2015-12-25 DIAGNOSIS — I482 Chronic atrial fibrillation, unspecified: Secondary | ICD-10-CM

## 2015-12-25 DIAGNOSIS — I10 Essential (primary) hypertension: Secondary | ICD-10-CM | POA: Diagnosis not present

## 2015-12-25 DIAGNOSIS — I5032 Chronic diastolic (congestive) heart failure: Secondary | ICD-10-CM

## 2015-12-25 DIAGNOSIS — E0822 Diabetes mellitus due to underlying condition with diabetic chronic kidney disease: Secondary | ICD-10-CM

## 2015-12-25 DIAGNOSIS — Z5181 Encounter for therapeutic drug level monitoring: Secondary | ICD-10-CM

## 2015-12-25 DIAGNOSIS — Z7901 Long term (current) use of anticoagulants: Secondary | ICD-10-CM

## 2015-12-25 LAB — HEPATIC FUNCTION PANEL
ALBUMIN: 3.5 g/dL — AB (ref 3.6–5.1)
ALK PHOS: 36 U/L (ref 33–130)
ALT: 64 U/L — ABNORMAL HIGH (ref 6–29)
AST: 64 U/L — AB (ref 10–35)
Bilirubin, Direct: 0.3 mg/dL — ABNORMAL HIGH (ref <=0.2)
Indirect Bilirubin: 0.6 mg/dL (ref 0.2–1.2)
TOTAL PROTEIN: 6.3 g/dL (ref 6.1–8.1)
Total Bilirubin: 0.9 mg/dL (ref 0.2–1.2)

## 2015-12-25 LAB — BASIC METABOLIC PANEL
BUN: 94 mg/dL — AB (ref 7–25)
CALCIUM: 9.3 mg/dL (ref 8.6–10.4)
CO2: 26 mmol/L (ref 20–31)
CREATININE: 2.67 mg/dL — AB (ref 0.60–0.88)
Chloride: 96 mmol/L — ABNORMAL LOW (ref 98–110)
GLUCOSE: 200 mg/dL — AB (ref 65–99)
Potassium: 3.7 mmol/L (ref 3.5–5.3)
Sodium: 137 mmol/L (ref 135–146)

## 2015-12-25 LAB — TSH: TSH: 1.41 mIU/L

## 2015-12-25 MED ORDER — AMIODARONE HCL 200 MG PO TABS: 100.0000 mg | ORAL_TABLET | Freq: Every day | ORAL | 2 refills | Status: DC

## 2015-12-25 NOTE — Addendum Note (Signed)
Addended by: Eulis Foster on: 12/25/2015 08:48 AM   Modules accepted: Orders

## 2015-12-25 NOTE — Progress Notes (Signed)
Cardiology Office Note    Date:  12/25/2015   ID:  Costa, Melanie 1934/02/08, MRN [id number]  PCP:  Aretta Nip, MD  Cardiologist: Sinclair Grooms, MD   Chief Complaint  Patient presents with  . Congestive Heart Failure    History of Present Illness:  [name] is a 82 y.o. female  who presents for Paroxysmal atrial fib, amiodarone therapy, chronic diastolic heart failure, elderly and frail.  She has chronic atrial fibrillation maintained in rhythm on amiodarone. Recent laboratory data demonstrated a potassium of 2.2 when she saw Dr. Dwyane Dee. Presumably the potassium has been repleted.  She denies orthopnea, PND, syncope, and lower extremity swelling. She is taking Zaroxolyn as needed.   Past Medical History:  Diagnosis Date  . Diabetes mellitus without complication ([name])   . Fall at home Sept. 3, 2016   Fx  3 ribs  . Hypertension   . Ileus, postoperative ([name]) 12/17/2012  . Irregular heart beat   . Renal disorder     Past Surgical History:  Procedure Laterality Date  . CATARACT EXTRACTION, BILATERAL    . HERNIA REPAIR    . INSERTION OF MESH [name] 12/12/2012   Procedure: INSERTION OF MESH;  Surgeon: Madilyn Hook, DO;  Location: [facility];  Service: General;  Laterality: [name];  . LAPAROSCOPIC LYSIS OF ADHESIONS [name] 12/12/2012   Procedure: LAPAROSCOPIC LYSIS OF ADHESIONS;  Surgeon: Madilyn Hook, DO;  Location: [facility];  Service: General;  Laterality: [name];  . Right Lumpectomy    . VENTRAL HERNIA REPAIR [name] 12/12/2012   Procedure: LAPAROSCOPIC VENTRAL HERNIA;  Surgeon: Madilyn Hook, DO;  Location: [facility];  Service: General;  Laterality: [name];    Current Medications: Outpatient Medications Prior to Visit  Medication Sig Dispense Refill  . amiodarone (PACERONE) 200 MG tablet TAKE 1 TABLET BY MOUTH EVERY DAY 90 tablet 2  . atorvastatin (LIPITOR) 40 MG tablet Take 40 mg by mouth daily.    . B-D UF III MINI PEN NEEDLES 31G X 5 MM MISC USE 3 PEN NEEDLES PER DAY 100  each 3  . Cranberry 250 MG CAPS Take 250 mg by mouth daily.    Marland Kitchen econazole nitrate 1 % cream Apply 1 application topically daily as needed (to groin rash as needed).    Marland Kitchen esomeprazole (NEXIUM) 20 MG capsule Take 20 mg by mouth daily before breakfast.     . ferrous sulfate 325 (65 FE) MG tablet Take 325 mg by mouth daily with breakfast.    . fluticasone (FLONASE) 50 MCG/ACT nasal spray Place 2 sprays into the nose daily as needed for allergies.    . furosemide (LASIX) 80 MG tablet Take 80 mg by mouth 2 (two) times daily. 1 am    . glucose blood (ONETOUCH VERIO) test strip USE ONE STRIP TO CHECK GLUCOSE TWICE DAILY AS DIRECTED 100 each 3  . HUMALOG KWIKPEN 100 UNIT/ML KiwkPen Inject 7 units in am and 8 units in pm 15 mL 3  . metoprolol (LOPRESSOR) 50 MG tablet Take 50 mg by mouth 2 (two) times daily.    . Multiple Vitamins-Minerals (CENTRUM SILVER ADULT 50+) TABS Take 1 tablet by mouth daily.     Glory Rosebush DELICA LANCETS FINE MISC Use to check blood sugar 2 times per day dx code 250.02 100 each 1  . pantoprazole (PROTONIX) 40 MG tablet Take 40 mg by mouth daily.  5  . polyethylene glycol (MIRALAX / GLYCOLAX) packet Take 17 g  by mouth daily as needed for mild constipation.    . potassium chloride (KLOR-CON 10) 10 MEQ tablet Take 3 tablets in the am and 4 tablets in the pm 210 tablet 5  . VICTOZA 18 MG/3ML SOPN INJECT 1.2MG  SUBCUTANEOUSLY DAILY 6 pen 2  . warfarin (COUMADIN) 5 MG tablet TAKE AS DIRECTED BY COUMADIN CLINIC 30 tablet 3  . amLODipine (NORVASC) 10 MG tablet Take 10 mg by mouth daily.     . cephALEXin (KEFLEX) 250 MG capsule Take 250 mg by mouth as directed. Reported on 09/11/2015  0  . metolazone (ZAROXOLYN) 5 MG tablet Take 5 mg by mouth See admin instructions.  3  . traMADol (ULTRAM) 50 MG tablet as needed. Reported on 09/11/2015  0   No facility-administered medications prior to visit.      Allergies:   Lotensin [benazepril hcl]; Zantac [ranitidine hcl]; and Tape   Social  History   Social History  . Marital status: Married    Spouse name: [name]  . Number of children: [name]  . Years of education: [name]   Social History Main Topics  . Smoking status: Former Smoker    Types: Cigarettes  . Smokeless tobacco: Never Used  . Alcohol use No  . Drug use: No  . Sexual activity: No   Other Topics Concern  . None   Social History Narrative  . None     Family History:  The patient's family history includes Diabetes in her mother and sister.   ROS:   Please see the history of present illness.    Gallop including tophi on her finger, right thumb. Lower extremity swelling. Difficulty with balance and walking. Significant weight loss. Appetite is stable.  All other systems reviewed and are negative.   PHYSICAL EXAM:   VS:  BP (!) 132/56   Pulse 61   Ht 5\' 6"  (1.676 m)   Wt 183 lb 3.2 oz (83.1 kg)   BMI 29.57 kg/m    GEN: Well nourished, well developed, in no acute distress  HEENT: normal  Neck: no JVD, carotid bruits, or masses Cardiac: RRR; no murmurs, rubs, or gallops,no edema  Respiratory:  clear to auscultation bilaterally, normal work of breathing GI: soft, nontender, nondistended, + BS MS: no deformity or atrophy  Skin: warm and dry, no rash Neuro:  Alert and Oriented x 3, Strength and sensation are intact Psych: euthymic mood, full affect  Wt Readings from Last 3 Encounters:  12/25/15 183 lb 3.2 oz (83.1 kg)  12/11/15 186 lb (84.4 kg)  09/11/15 193 lb (87.5 kg)      Studies/Labs Reviewed:   EKG:  EKG  Wandering baseline. Sinus bradycardia. PR interval is 260 ms. T-wave flattening and QT prolongation or suspected.  Recent Labs: 06/15/2015: TSH 1.06 12/11/2015: ALT 54; BUN 102; Creatinine, Ser 3.28; Potassium 2.2; Sodium 134   Lipid Panel    Component Value Date/Time   CHOL 125 09/11/2015 1041   TRIG 86.0 09/11/2015 1041   HDL 56.70 09/11/2015 1041   CHOLHDL 2 09/11/2015 1041   VLDL 17.2 09/11/2015 1041   LDLCALC 51 09/11/2015 1041     LDLDIRECT 37.8 10/27/2013 0808    Additional studies/ records that were reviewed today include:  Laboratory data is as noted above.    ASSESSMENT:    1. Chronic atrial fibrillation ([place])   2. Chronic diastolic heart failure ([place])   3. Essential hypertension   4. Chronic kidney disease, stage IV (severe) ([place])   5. Diabetes  mellitus due to underlying condition with chronic kidney disease, without long-term current use of insulin, unspecified CKD stage ([place])   6. On amiodarone therapy   7. Long term current use of anticoagulant therapy   8. Encounter for therapeutic drug monitoring      PLAN:  In order of problems listed above:  1. Sinus rhythm. Decrease amiodarone 100 mg per day. TSH, hepatic panel, and basic metabolic panel today. We will have to make sure that the potassium is repleted. 2. No evidence of volume overload 3. Excellent blood pressure control despite decrease in amlodipine dose of 5 mg by Dr. Dwyane Dee. 4. We will see what the kidney function is like on today's laboratory data 5. Not evaluated 6. Decrease amiodarone to 100 mg per day to decrease the potential for malignant arrhythmia in the setting of electrolyte disturbance. Hopefully this dose will maintain sinus rhythm. 7. No bleeding complications.    Medication Adjustments/Labs and Tests Ordered: Current medicines are reviewed at length with the patient today.  Concerns regarding medicines are outlined above.  Medication changes, Labs and Tests ordered today are listed in the Patient Instructions below. There are no Patient Instructions on file for this visit.   Signed, Sinclair Grooms, MD  12/25/2015 8:34 AM    [facility] [address], [place], [place]  [address] Phone: [phone]; Fax: [phone]

## 2015-12-25 NOTE — Addendum Note (Signed)
Addended by: Eulis Foster on: 12/25/2015 08:10 AM   Modules accepted: Orders

## 2015-12-25 NOTE — Patient Instructions (Addendum)
Medication Instructions:  Your physician has recommended you make the following change in your medication:  Decrease amiodarone to 100 mg by mouth daily. (this will be half of a 200 mg tablet)   Labwork: Lab work was already done today.  Testing/Procedures: None--Will have Chest X-ray done after you see Dr. Tamala Julian in 6 months.   Follow-Up: Your physician wants you to follow-up in: 6 months.  You will receive a reminder letter in the mail two months in advance. If you don't receive a letter, please call our office to schedule the follow-up appointment.   Any Other Special Instructions Will Be Listed Below (If Applicable).     If you need a refill on your cardiac medications before your next appointment, please call your pharmacy.

## 2015-12-26 ENCOUNTER — Telehealth: Payer: Self-pay | Admitting: *Deleted

## 2015-12-26 DIAGNOSIS — R7989 Other specified abnormal findings of blood chemistry: Secondary | ICD-10-CM

## 2015-12-26 DIAGNOSIS — R799 Abnormal finding of blood chemistry, unspecified: Secondary | ICD-10-CM

## 2015-12-26 MED ORDER — FUROSEMIDE 80 MG PO TABS: 80.0000 mg | ORAL_TABLET | Freq: Every day | ORAL | 3 refills | Status: DC

## 2015-12-26 MED ORDER — POTASSIUM CHLORIDE ER 10 MEQ PO TBCR
EXTENDED_RELEASE_TABLET | ORAL | 3 refills | Status: DC
Start: 2015-12-26 — End: 2016-12-09

## 2015-12-26 NOTE — Telephone Encounter (Signed)
-----   Message from Belva Crome, MD sent at 12/25/2015  6:31 PM EDT ----- Let the patient know blood work suggests volume contraction. Decrease furosemide to 80 mg once a day and take the metolazone as needed if leg swelling or fluid retention. Decrease potassium to 30 mEq twice daily. Repeat basic metabolic panel in one week. A copy will be sent to Aretta Nip, MD

## 2015-12-26 NOTE — Telephone Encounter (Signed)
Spoke with pt and advised her of recommendations per Dr. Tamala Julian.  Pt verbalized understanding and was in agreement with this plan.  Pt will have labs repeated on 10/12.

## 2015-12-28 ENCOUNTER — Telehealth: Payer: Self-pay | Admitting: *Deleted

## 2015-12-28 DIAGNOSIS — M109 Gout, unspecified: Secondary | ICD-10-CM | POA: Diagnosis not present

## 2015-12-28 NOTE — Telephone Encounter (Signed)
Pt called to inform CVRR because she started Prednisone 20mg  & she will be taking 40mg  on 10/6 and 10/7 and on 10/8, 10/9, 10/10 20mg .  Pt was advised to take 2.5mg  of Coumadin on 10/6 & 10/7 then resume normal dose.  Advised pt to have INR checked on Monday. Pt states that she does not have transportation and she would have call back. Educated her on the risk of having blood clots, stroke or bleeding & she verbalized. Pt called back & will return on 12/31/15 for an INR recheck.  Advised to call back with any other medication issues & she verbalized understanding.

## 2015-12-31 ENCOUNTER — Ambulatory Visit (INDEPENDENT_AMBULATORY_CARE_PROVIDER_SITE_OTHER): Payer: Medicare Other | Admitting: *Deleted

## 2015-12-31 DIAGNOSIS — I4891 Unspecified atrial fibrillation: Secondary | ICD-10-CM

## 2015-12-31 DIAGNOSIS — Z5181 Encounter for therapeutic drug level monitoring: Secondary | ICD-10-CM | POA: Diagnosis not present

## 2015-12-31 LAB — POCT INR: INR: 2.2

## 2016-01-03 ENCOUNTER — Telehealth: Payer: Self-pay | Admitting: *Deleted

## 2016-01-03 ENCOUNTER — Other Ambulatory Visit: Payer: Medicare Other | Admitting: *Deleted

## 2016-01-03 DIAGNOSIS — I480 Paroxysmal atrial fibrillation: Secondary | ICD-10-CM | POA: Diagnosis not present

## 2016-01-03 DIAGNOSIS — I10 Essential (primary) hypertension: Secondary | ICD-10-CM

## 2016-01-03 DIAGNOSIS — E7800 Pure hypercholesterolemia, unspecified: Secondary | ICD-10-CM

## 2016-01-03 LAB — BASIC METABOLIC PANEL
BUN: 71 mg/dL — AB (ref 7–25)
CHLORIDE: 102 mmol/L (ref 98–110)
CO2: 25 mmol/L (ref 20–31)
CREATININE: 2.23 mg/dL — AB (ref 0.60–0.88)
Calcium: 8.8 mg/dL (ref 8.6–10.4)
Glucose, Bld: 209 mg/dL — ABNORMAL HIGH (ref 65–99)
Potassium: 3.6 mmol/L (ref 3.5–5.3)
Sodium: 138 mmol/L (ref 135–146)

## 2016-01-03 NOTE — Telephone Encounter (Signed)
Advised pt of lab results and recommendations per Dr. Tamala Julian.  Labs scheduled for 02/05/16.  Pt verbalized understanding and was in agreement with this plan.

## 2016-01-03 NOTE — Telephone Encounter (Signed)
-----   Message from Belva Crome, MD sent at 01/03/2016  4:18 PM EDT ----- Let the patient know labs are improved. Continue taking furosemide 80 mg daily. Repeat basic metabolic panel in 1 month. Call if increasing shortness of breath or swelling A copy will be sent to Aretta Nip, MD

## 2016-01-03 NOTE — Addendum Note (Signed)
Addended by: Eulis Foster on: 01/03/2016 09:57 AM   Modules accepted: Orders

## 2016-01-10 ENCOUNTER — Telehealth: Payer: Self-pay | Admitting: *Deleted

## 2016-01-10 NOTE — Telephone Encounter (Signed)
Pt called stating that she started Prednisone for gout and the dose is to take Prednisone 60 mg on Oct 18th and on Oct 19th then 40 mg on Oct 20th and 21st then Sunday and Monday take 20 mg Appt made for recheck of INR on Friday Oct 20th and she states understanding

## 2016-01-11 ENCOUNTER — Ambulatory Visit (INDEPENDENT_AMBULATORY_CARE_PROVIDER_SITE_OTHER): Payer: Medicare Other | Admitting: Pharmacist

## 2016-01-11 DIAGNOSIS — I4891 Unspecified atrial fibrillation: Secondary | ICD-10-CM | POA: Diagnosis not present

## 2016-01-11 DIAGNOSIS — Z5181 Encounter for therapeutic drug level monitoring: Secondary | ICD-10-CM | POA: Diagnosis not present

## 2016-01-11 LAB — POCT INR: INR: 2.1

## 2016-01-15 ENCOUNTER — Ambulatory Visit (INDEPENDENT_AMBULATORY_CARE_PROVIDER_SITE_OTHER): Payer: Medicare Other | Admitting: Sports Medicine

## 2016-01-15 ENCOUNTER — Encounter: Payer: Self-pay | Admitting: Sports Medicine

## 2016-01-15 DIAGNOSIS — E1140 Type 2 diabetes mellitus with diabetic neuropathy, unspecified: Secondary | ICD-10-CM

## 2016-01-15 DIAGNOSIS — M79674 Pain in right toe(s): Secondary | ICD-10-CM

## 2016-01-15 DIAGNOSIS — I739 Peripheral vascular disease, unspecified: Secondary | ICD-10-CM

## 2016-01-15 DIAGNOSIS — B351 Tinea unguium: Secondary | ICD-10-CM

## 2016-01-15 DIAGNOSIS — M79675 Pain in left toe(s): Secondary | ICD-10-CM | POA: Diagnosis not present

## 2016-01-15 NOTE — Progress Notes (Signed)
Subjective: [name] is a 82 y.o. female patient with history of diabetes who presents to office today complaining of long, painful nails  while ambulating in shoes; unable to trim. Patient states that the glucose reading this morning was not recorded. However, has been up because she is on steroids for her gout flareup. Patient denies any new changes in medication or new problems. Patient denies any new cramping, numbness, burning or tingling in the legs.  Patient Active Problem List   Diagnosis Date Noted  . Community acquired pneumonia 12/07/2014  . Intractable pain 12/03/2014  . Type II diabetes mellitus, uncontrolled ([place]) 09/06/2013  . Chronic diastolic heart failure ([place]) 08/23/2013  . Anemia due to blood loss, chronic 08/23/2013  . Peptic esophagitis 08/23/2013  . Encounter for therapeutic drug monitoring 04/29/2013  . On amiodarone therapy 02/22/2013  . Long term current use of anticoagulant therapy 02/22/2013  . Candidal skin infection 12/23/2012  . Hyperlipidemia 12/23/2012  . GERD (gastroesophageal reflux disease) 12/23/2012  . Diabetes mellitus with renal complications ([place]) 17/49/4496  . Pulmonary edema 12/23/2012  . Ileus, postoperative ([place]) 12/17/2012  . Umbilical hernia, incarcerated - reducible 12/10/2012  . SBO (small bowel obstruction) 12/09/2012  . HTN (hypertension) 12/09/2012  . Dyslipidemia 12/09/2012  . Atrial fibrillation ([place]) 12/09/2012  . Chronic kidney disease, stage IV (severe) ([place]) 12/09/2012   Current Outpatient Prescriptions on File Prior to Visit  Medication Sig Dispense Refill  . amiodarone (PACERONE) 200 MG tablet Take 0.5 tablets (100 mg total) by mouth daily. 90 tablet 2  . amLODipine (NORVASC) 10 MG tablet Take 5 mg by mouth daily.  1  . atorvastatin (LIPITOR) 40 MG tablet Take 40 mg by mouth daily.    . B-D UF III MINI PEN NEEDLES 31G X 5 MM MISC USE 3 PEN NEEDLES PER DAY 100 each 3  . Cranberry 250 MG CAPS Take 250 mg by mouth daily.     Marland Kitchen econazole nitrate 1 % cream Apply 1 application topically daily as needed (to groin rash as needed).    Marland Kitchen esomeprazole (NEXIUM) 20 MG capsule Take 20 mg by mouth daily before breakfast.     . ferrous sulfate 325 (65 FE) MG tablet Take 325 mg by mouth daily with breakfast.    . fluticasone (FLONASE) 50 MCG/ACT nasal spray Place 2 sprays into the nose daily as needed for allergies.    . furosemide (LASIX) 80 MG tablet Take 1 tablet (80 mg total) by mouth daily. 90 tablet 3  . glucose blood (ONETOUCH VERIO) test strip USE ONE STRIP TO CHECK GLUCOSE TWICE DAILY AS DIRECTED 100 each 3  . HUMALOG KWIKPEN 100 UNIT/ML KiwkPen Inject 7 units in am and 8 units in pm 15 mL 3  . metolazone (ZAROXOLYN) 5 MG tablet Take 5 mg by mouth daily as needed for fluid.  3  . metoprolol (LOPRESSOR) 50 MG tablet Take 50 mg by mouth 2 (two) times daily.    . Multiple Vitamins-Minerals (CENTRUM SILVER ADULT 50+) TABS Take 1 tablet by mouth daily.     Glory Rosebush DELICA LANCETS FINE MISC Use to check blood sugar 2 times per day dx code 250.02 100 each 1  . pantoprazole (PROTONIX) 40 MG tablet Take 40 mg by mouth daily.  5  . polyethylene glycol (MIRALAX / GLYCOLAX) packet Take 17 g by mouth daily as needed for mild constipation.    . potassium chloride (KLOR-CON 10) 10 MEQ tablet Take 3 tablets in the am and  3 tablets in the pm 180 tablet 3  . VICTOZA 18 MG/3ML SOPN INJECT 1.2MG  SUBCUTANEOUSLY DAILY 6 pen 2  . warfarin (COUMADIN) 5 MG tablet TAKE AS DIRECTED BY COUMADIN CLINIC 30 tablet 3   No current facility-administered medications on file prior to visit.    Allergies  Allergen Reactions  . Lotensin [Benazepril Hcl] Anaphylaxis  . Zantac [Ranitidine Hcl] Anaphylaxis  . Tape Itching and Rash    Please use "paper" tape only.    Recent Results (from the past 2160 hour(s))  POCT INR     Status: None   Collection Time: 10/26/15 10:22 AM  Result Value Ref Range   INR 2.6   POCT INR     Status: None    Collection Time: 12/07/15 10:19 AM  Result Value Ref Range   INR 1.8   Hemoglobin A1c     Status: Abnormal   Collection Time: 12/11/15 11:43 AM  Result Value Ref Range   Hgb A1c MFr Bld 8.0 (H) 4.6 - 6.5 %    Comment: Glycemic Control Guidelines for People with Diabetes:Non Diabetic:  <6%Goal of Therapy: <7%Additional Action Suggested:  >8%   Comprehensive metabolic panel     Status: Abnormal   Collection Time: 12/11/15 11:43 AM  Result Value Ref Range   Sodium 134 (L) 135 - 145 mEq/L   Potassium 2.2 (LL) 3.5 - 5.1 mEq/L   Chloride 90 (L) 96 - 112 mEq/L   CO2 33 (H) 19 - 32 mEq/L   Glucose, Bld 96 70 - 99 mg/dL   BUN 102 (HH) 6 - 23 mg/dL   Creatinine, Ser 3.28 (H) 0.40 - 1.20 mg/dL   Total Bilirubin 0.5 0.2 - 1.2 mg/dL   Alkaline Phosphatase 48 39 - 117 U/L   AST 60 (H) 0 - 37 U/L   ALT 54 (H) 0 - 35 U/L   Total Protein 7.6 6.0 - 8.3 g/dL   Albumin 3.7 3.5 - 5.2 g/dL   Calcium 9.5 8.4 - 10.5 mg/dL   GFR 17.41 (L) >60.00 mL/min  POCT INR     Status: None   Collection Time: 12/18/15  9:01 AM  Result Value Ref Range   INR 2.8   Hepatic function panel     Status: Abnormal   Collection Time: 12/25/15  8:10 AM  Result Value Ref Range   Total Bilirubin 0.9 0.2 - 1.2 mg/dL   Bilirubin, Direct 0.3 (H) <=0.2 mg/dL   Indirect Bilirubin 0.6 0.2 - 1.2 mg/dL   Alkaline Phosphatase 36 33 - 130 U/L   AST 64 (H) 10 - 35 U/L   ALT 64 (H) 6 - 29 U/L   Total Protein 6.3 6.1 - 8.1 g/dL   Albumin 3.5 (L) 3.6 - 5.1 g/dL  TSH     Status: None   Collection Time: 12/25/15  8:10 AM  Result Value Ref Range   TSH 1.41 mIU/L    Comment:   Reference Range   > or = 20 Years  0.40-4.50   Pregnancy Range First trimester  0.26-2.66 Second trimester 0.55-2.73 Third trimester  0.43-2.91     Basic metabolic panel     Status: Abnormal   Collection Time: 12/25/15  8:48 AM  Result Value Ref Range   Sodium 137 135 - 146 mmol/L   Potassium 3.7 3.5 - 5.3 mmol/L   Chloride 96 (L) 98 - 110 mmol/L    CO2 26 20 - 31 mmol/L   Glucose, Bld 200 (H) 65 -  99 mg/dL   BUN 94 (H) 7 - 25 mg/dL   Creat 2.67 (H) 0.60 - 0.88 mg/dL    Comment:   For patients > or = 80 years of age: The upper reference limit for Creatinine is approximately 13% higher for people identified as African-American.      Calcium 9.3 8.6 - 10.4 mg/dL  POCT INR     Status: None   Collection Time: 12/31/15  9:20 AM  Result Value Ref Range   INR 2.2   Basic metabolic panel     Status: Abnormal   Collection Time: 01/03/16  9:57 AM  Result Value Ref Range   Sodium 138 135 - 146 mmol/L   Potassium 3.6 3.5 - 5.3 mmol/L   Chloride 102 98 - 110 mmol/L   CO2 25 20 - 31 mmol/L   Glucose, Bld 209 (H) 65 - 99 mg/dL   BUN 71 (H) 7 - 25 mg/dL   Creat 2.23 (H) 0.60 - 0.88 mg/dL    Comment:   For patients > or = 80 years of age: The upper reference limit for Creatinine is approximately 13% higher for people identified as African-American.      Calcium 8.8 8.6 - 10.4 mg/dL  POCT INR     Status: None   Collection Time: 01/11/16  9:31 AM  Result Value Ref Range   INR 2.1     Objective: General: Patient is awake, alert, and oriented x 3 and in no acute distress.  Integument: Skin is warm, dry and supple bilateral. Nails are tender, long, thickened and dystrophic with subungual debris, consistent with onychomycosis, 1-5 bilateral. No signs of infection. No open lesions or preulcerative lesions present bilateral. Remaining integument unremarkable.  Vasculature:  Dorsalis Pedis pulse 0/4 bilateral. Posterior Tibial pulse  0/4 bilateral.  Capillary fill time <5 sec 1-5 bilateral. No hair growth to the level of the digits. Temperature gradient within normal limits. Mild varicosities present bilateral. 1+ left over right edema present bilateral.   Neurology: The patient has intact sensation measured with a 5.07/10g Semmes Weinstein Monofilament at all pedal sites bilateral . Vibratory sensation diminished bilateral with tuning  fork. No Babinski sign present bilateral.   Musculoskeletal: Pes planus and mild hammertoe noted bilateral. Muscular strength 5/5 in all lower extremity muscular groups bilateral without pain on range of motion. No tenderness with calf compression bilateral. No acute signs of DVT.  Assessment and Plan: Problem List Items Addressed This Visit    None    Visit Diagnoses    Mycotic toenails    -  Primary   Pain in toes of both feet       PVD (peripheral vascular disease) ([place])       Type 2 diabetes mellitus with diabetic neuropathy, unspecified long term insulin use status ([place])         -Examined patient. -Discussed and educated patient on diabetic foot care, especially with  regards to the vascular, neurological and musculoskeletal systems.  -Stressed the importance of good glycemic control and the detriment of not  controlling glucose levels in relation to the foot. -Mechanically debrided all nails 1-5 bilateral using sterile nail nipper and filed with dremel without incident  -Answered all patient questions -Advised patient that if swelling continues to progress. May schedule repeat vascular testing. -Patient to return  in 3 months for at risk foot care -Patient advised to call the office if any problems or questions arise in the meantime.  Landis Martins, DPM

## 2016-01-18 DIAGNOSIS — H43813 Vitreous degeneration, bilateral: Secondary | ICD-10-CM | POA: Diagnosis not present

## 2016-01-18 DIAGNOSIS — H40023 Open angle with borderline findings, high risk, bilateral: Secondary | ICD-10-CM | POA: Diagnosis not present

## 2016-01-18 DIAGNOSIS — H35372 Puckering of macula, left eye: Secondary | ICD-10-CM | POA: Diagnosis not present

## 2016-01-18 DIAGNOSIS — E119 Type 2 diabetes mellitus without complications: Secondary | ICD-10-CM | POA: Diagnosis not present

## 2016-01-18 LAB — HM DIABETES EYE EXAM

## 2016-01-19 DIAGNOSIS — E119 Type 2 diabetes mellitus without complications: Secondary | ICD-10-CM | POA: Diagnosis not present

## 2016-01-25 ENCOUNTER — Ambulatory Visit (INDEPENDENT_AMBULATORY_CARE_PROVIDER_SITE_OTHER): Payer: Medicare Other | Admitting: *Deleted

## 2016-01-25 DIAGNOSIS — Z5181 Encounter for therapeutic drug level monitoring: Secondary | ICD-10-CM

## 2016-01-25 DIAGNOSIS — I4891 Unspecified atrial fibrillation: Secondary | ICD-10-CM

## 2016-01-25 LAB — POCT INR: INR: 2.3

## 2016-01-29 DIAGNOSIS — E1122 Type 2 diabetes mellitus with diabetic chronic kidney disease: Secondary | ICD-10-CM | POA: Diagnosis not present

## 2016-01-29 DIAGNOSIS — I480 Paroxysmal atrial fibrillation: Secondary | ICD-10-CM | POA: Diagnosis not present

## 2016-01-29 DIAGNOSIS — N184 Chronic kidney disease, stage 4 (severe): Secondary | ICD-10-CM | POA: Diagnosis not present

## 2016-01-29 DIAGNOSIS — Z794 Long term (current) use of insulin: Secondary | ICD-10-CM | POA: Diagnosis not present

## 2016-01-29 DIAGNOSIS — I5030 Unspecified diastolic (congestive) heart failure: Secondary | ICD-10-CM | POA: Diagnosis not present

## 2016-01-29 DIAGNOSIS — I10 Essential (primary) hypertension: Secondary | ICD-10-CM | POA: Diagnosis not present

## 2016-02-01 DIAGNOSIS — N189 Chronic kidney disease, unspecified: Secondary | ICD-10-CM | POA: Diagnosis not present

## 2016-02-01 DIAGNOSIS — N183 Chronic kidney disease, stage 3 (moderate): Secondary | ICD-10-CM | POA: Diagnosis not present

## 2016-02-01 DIAGNOSIS — D631 Anemia in chronic kidney disease: Secondary | ICD-10-CM | POA: Diagnosis not present

## 2016-02-01 DIAGNOSIS — N2581 Secondary hyperparathyroidism of renal origin: Secondary | ICD-10-CM | POA: Diagnosis not present

## 2016-02-01 DIAGNOSIS — Z6832 Body mass index (BMI) 32.0-32.9, adult: Secondary | ICD-10-CM | POA: Diagnosis not present

## 2016-02-05 ENCOUNTER — Other Ambulatory Visit: Payer: Medicare Other | Admitting: *Deleted

## 2016-02-05 ENCOUNTER — Other Ambulatory Visit: Payer: Self-pay | Admitting: Interventional Cardiology

## 2016-02-05 DIAGNOSIS — I10 Essential (primary) hypertension: Secondary | ICD-10-CM | POA: Diagnosis not present

## 2016-02-05 LAB — BASIC METABOLIC PANEL
BUN: 42 mg/dL — ABNORMAL HIGH (ref 7–25)
CALCIUM: 9 mg/dL (ref 8.6–10.4)
CO2: 23 mmol/L (ref 20–31)
CREATININE: 1.99 mg/dL — AB (ref 0.60–0.88)
Chloride: 108 mmol/L (ref 98–110)
Glucose, Bld: 175 mg/dL — ABNORMAL HIGH (ref 65–99)
Potassium: 3.9 mmol/L (ref 3.5–5.3)
SODIUM: 141 mmol/L (ref 135–146)

## 2016-02-22 ENCOUNTER — Ambulatory Visit (INDEPENDENT_AMBULATORY_CARE_PROVIDER_SITE_OTHER): Payer: Medicare Other

## 2016-02-22 DIAGNOSIS — Z5181 Encounter for therapeutic drug level monitoring: Secondary | ICD-10-CM | POA: Diagnosis not present

## 2016-02-22 DIAGNOSIS — I4891 Unspecified atrial fibrillation: Secondary | ICD-10-CM | POA: Diagnosis not present

## 2016-02-22 LAB — POCT INR: INR: 1.5

## 2016-03-07 ENCOUNTER — Ambulatory Visit (INDEPENDENT_AMBULATORY_CARE_PROVIDER_SITE_OTHER): Payer: Medicare Other

## 2016-03-07 DIAGNOSIS — I4891 Unspecified atrial fibrillation: Secondary | ICD-10-CM

## 2016-03-07 DIAGNOSIS — Z5181 Encounter for therapeutic drug level monitoring: Secondary | ICD-10-CM

## 2016-03-07 LAB — POCT INR: INR: 2.1

## 2016-03-14 ENCOUNTER — Ambulatory Visit: Payer: Medicare Other | Admitting: Endocrinology

## 2016-03-28 ENCOUNTER — Other Ambulatory Visit: Payer: Self-pay | Admitting: Endocrinology

## 2016-04-02 ENCOUNTER — Ambulatory Visit (INDEPENDENT_AMBULATORY_CARE_PROVIDER_SITE_OTHER): Payer: Medicare Other | Admitting: *Deleted

## 2016-04-02 DIAGNOSIS — Z5181 Encounter for therapeutic drug level monitoring: Secondary | ICD-10-CM | POA: Diagnosis not present

## 2016-04-02 DIAGNOSIS — I4891 Unspecified atrial fibrillation: Secondary | ICD-10-CM | POA: Diagnosis not present

## 2016-04-02 DIAGNOSIS — R600 Localized edema: Secondary | ICD-10-CM | POA: Diagnosis not present

## 2016-04-02 DIAGNOSIS — I5030 Unspecified diastolic (congestive) heart failure: Secondary | ICD-10-CM | POA: Diagnosis not present

## 2016-04-02 DIAGNOSIS — I480 Paroxysmal atrial fibrillation: Secondary | ICD-10-CM | POA: Diagnosis not present

## 2016-04-02 DIAGNOSIS — N184 Chronic kidney disease, stage 4 (severe): Secondary | ICD-10-CM | POA: Diagnosis not present

## 2016-04-02 DIAGNOSIS — L03116 Cellulitis of left lower limb: Secondary | ICD-10-CM | POA: Diagnosis not present

## 2016-04-02 LAB — POCT INR: INR: 1.7

## 2016-04-08 DIAGNOSIS — L03116 Cellulitis of left lower limb: Secondary | ICD-10-CM | POA: Diagnosis not present

## 2016-04-08 DIAGNOSIS — N949 Unspecified condition associated with female genital organs and menstrual cycle: Secondary | ICD-10-CM | POA: Diagnosis not present

## 2016-04-08 DIAGNOSIS — R600 Localized edema: Secondary | ICD-10-CM | POA: Diagnosis not present

## 2016-04-08 DIAGNOSIS — N184 Chronic kidney disease, stage 4 (severe): Secondary | ICD-10-CM | POA: Diagnosis not present

## 2016-04-13 ENCOUNTER — Encounter: Payer: Self-pay | Admitting: Endocrinology

## 2016-04-15 ENCOUNTER — Ambulatory Visit (INDEPENDENT_AMBULATORY_CARE_PROVIDER_SITE_OTHER): Payer: Medicare Other | Admitting: Sports Medicine

## 2016-04-15 ENCOUNTER — Encounter: Payer: Self-pay | Admitting: Sports Medicine

## 2016-04-15 DIAGNOSIS — M79675 Pain in left toe(s): Secondary | ICD-10-CM | POA: Diagnosis not present

## 2016-04-15 DIAGNOSIS — M79674 Pain in right toe(s): Secondary | ICD-10-CM | POA: Diagnosis not present

## 2016-04-15 DIAGNOSIS — B351 Tinea unguium: Secondary | ICD-10-CM

## 2016-04-15 DIAGNOSIS — I739 Peripheral vascular disease, unspecified: Secondary | ICD-10-CM

## 2016-04-15 DIAGNOSIS — E1140 Type 2 diabetes mellitus with diabetic neuropathy, unspecified: Secondary | ICD-10-CM

## 2016-04-15 LAB — HM DIABETES FOOT EXAM

## 2016-04-15 NOTE — Progress Notes (Signed)
Subjective: [name] is a 83 y.o. female patient with history of diabetes who returns to office today complaining of long, painful nails  while ambulating in shoes; unable to trim. Patient states that the glucose reading this morning was 120. States that she is dealing with cellulitis in left leg of which her PCP is treating with Keflex. Patient admits pain to back of left heel with in bed from pressure. Patient denies any new changes in medication or new problems. Patient denies any new cramping, numbness, burning or tingling in the legs.  Patient Active Problem List   Diagnosis Date Noted  . Community acquired pneumonia 12/07/2014  . Intractable pain 12/03/2014  . Type II diabetes mellitus, uncontrolled ([place]) 09/06/2013  . Chronic diastolic heart failure ([place]) 08/23/2013  . Anemia due to blood loss, chronic 08/23/2013  . Peptic esophagitis 08/23/2013  . Encounter for therapeutic drug monitoring 04/29/2013  . On amiodarone therapy 02/22/2013  . Long term current use of anticoagulant therapy 02/22/2013  . Candidal skin infection 12/23/2012  . Hyperlipidemia 12/23/2012  . GERD (gastroesophageal reflux disease) 12/23/2012  . Diabetes mellitus with renal complications ([place]) 52/84/1324  . Pulmonary edema 12/23/2012  . Ileus, postoperative ([place]) 12/17/2012  . Umbilical hernia, incarcerated - reducible 12/10/2012  . SBO (small bowel obstruction) 12/09/2012  . HTN (hypertension) 12/09/2012  . Dyslipidemia 12/09/2012  . Atrial fibrillation ([place]) 12/09/2012  . Chronic kidney disease, stage IV (severe) ([place]) 12/09/2012   Current Outpatient Prescriptions on File Prior to Visit  Medication Sig Dispense Refill  . amiodarone (PACERONE) 200 MG tablet Take 0.5 tablets (100 mg total) by mouth daily. 90 tablet 2  . amLODipine (NORVASC) 10 MG tablet Take 5 mg by mouth daily.  1  . atorvastatin (LIPITOR) 40 MG tablet Take 40 mg by mouth daily.    . B-D UF III MINI PEN NEEDLES 31G X 5 MM MISC USE 3  PEN NEEDLES PER DAY 100 each 3  . Cranberry 250 MG CAPS Take 250 mg by mouth daily.    Marland Kitchen econazole nitrate 1 % cream Apply 1 application topically daily as needed (to groin rash as needed).    Marland Kitchen esomeprazole (NEXIUM) 20 MG capsule Take 20 mg by mouth daily before breakfast.     . ferrous sulfate 325 (65 FE) MG tablet Take 325 mg by mouth daily with breakfast.    . fluticasone (FLONASE) 50 MCG/ACT nasal spray Place 2 sprays into the nose daily as needed for allergies.    . furosemide (LASIX) 80 MG tablet Take 1 tablet (80 mg total) by mouth daily. 90 tablet 3  . glucose blood (ONETOUCH VERIO) test strip USE ONE STRIP TO CHECK GLUCOSE TWICE DAILY AS DIRECTED 100 each 3  . HUMALOG KWIKPEN 100 UNIT/ML KiwkPen INJECT 7 UNITS SUBCUTANEOUSLY IN THE MORNING AND 8 UNITS IN THE EVENING 15 mL 2  . metolazone (ZAROXOLYN) 5 MG tablet Take 5 mg by mouth daily as needed for fluid.  3  . metoprolol (LOPRESSOR) 50 MG tablet Take 50 mg by mouth 2 (two) times daily.    . Multiple Vitamins-Minerals (CENTRUM SILVER ADULT 50+) TABS Take 1 tablet by mouth daily.     Glory Rosebush DELICA LANCETS FINE MISC Use to check blood sugar 2 times per day dx code 250.02 100 each 1  . pantoprazole (PROTONIX) 40 MG tablet Take 40 mg by mouth daily.  5  . polyethylene glycol (MIRALAX / GLYCOLAX) packet Take 17 g by mouth daily as needed for  mild constipation.    . potassium chloride (KLOR-CON 10) 10 MEQ tablet Take 3 tablets in the am and 3 tablets in the pm 180 tablet 3  . VICTOZA 18 MG/3ML SOPN INJECT 1.2MG  SUBCUTANEOUSLY DAILY 6 pen 2  . warfarin (COUMADIN) 5 MG tablet TAKE AS DIRECTED BY COUMADIN CLINIC 30 tablet 3   No current facility-administered medications on file prior to visit.    Allergies  Allergen Reactions  . Lotensin [Benazepril Hcl] Anaphylaxis  . Zantac [Ranitidine Hcl] Anaphylaxis  . Tape Itching and Rash    Please use "paper" tape only.    Recent Results (from the past 2160 hour(s))  HM DIABETES EYE EXAM      Status: None   Collection Time: 01/18/16 12:00 AM  Result Value Ref Range   HM Diabetic Eye Exam No Retinopathy No Retinopathy  POCT INR     Status: None   Collection Time: 01/25/16  9:29 AM  Result Value Ref Range   INR 2.3   Basic metabolic panel     Status: Abnormal   Collection Time: 02/05/16 10:25 AM  Result Value Ref Range   Sodium 141 135 - 146 mmol/L   Potassium 3.9 3.5 - 5.3 mmol/L   Chloride 108 98 - 110 mmol/L   CO2 23 20 - 31 mmol/L   Glucose, Bld 175 (H) 65 - 99 mg/dL   BUN 42 (H) 7 - 25 mg/dL   Creat 1.99 (H) 0.60 - 0.88 mg/dL    Comment:   For patients > or = 81 years of age: The upper reference limit for Creatinine is approximately 13% higher for people identified as African-American.      Calcium 9.0 8.6 - 10.4 mg/dL  POCT INR     Status: None   Collection Time: 02/22/16  9:26 AM  Result Value Ref Range   INR 1.5   POCT INR     Status: None   Collection Time: 03/07/16  9:39 AM  Result Value Ref Range   INR 2.1   POCT INR     Status: None   Collection Time: 04/02/16  1:45 PM  Result Value Ref Range   INR 1.7     Objective: General: Patient is awake, alert, and oriented x 3 and in no acute distress.  Integument: Skin is warm, dry and supple bilateral. Nails are tender, long, thickened and dystrophic with subungual debris, consistent with onychomycosis, 1-5 bilateral. No signs of infection. No open lesions or preulcerative lesions present bilateral. Mild warmth and redness to left leg consistent with cellulitis being treated by PCP with Keflex. No opening in skin. Remaining integument unremarkable.  Vasculature:  Dorsalis Pedis pulse 0/4 bilateral. Posterior Tibial pulse  0/4 bilateral. Capillary fill time <5 sec 1-5 bilateral. No hair growth to the level of the digits.Temperature gradient increased on left leg. Mild varicosities present bilateral. 1+ left over right edema present bilateral.   Neurology: The patient has intact sensation measured with a  5.07/10g Semmes Weinstein Monofilament at all pedal sites bilateral . Vibratory sensation diminished bilateral with tuning fork. No Babinski sign present bilateral.   Musculoskeletal: Pes planus and mild hammertoe noted bilateral. Muscular strength 5/5 in all lower extremity muscular groups bilateral without pain on range of motion. No tenderness with calf compression bilateral. No acute signs of DVT.  Assessment and Plan: Problem List Items Addressed This Visit    None    Visit Diagnoses    Mycotic toenails    -  Primary  Pain in toes of both feet       PVD (peripheral vascular disease) (H[facility])       Type 2 diabetes mellitus with diabetic neuropathy, unspecified long term insulin use status ([place])         -Examined patient. -Discussed and educated patient on diabetic foot care, especially with  regards to the vascular, neurological and musculoskeletal systems.  -Stressed the importance of good glycemic control and the detriment of not  controlling glucose levels in relation to the foot. -Mechanically debrided all nails 1-5 bilateral using sterile nail nipper and filed with dremel without incident -Gave left heel cushion for shoe and recommend offloading heel when in bed to assist with relieving area of pain -Continue with Keflex with PCP for cellulitis on left   -Answered all patient questions -Patient to return  in 3 months for at risk foot care -Patient advised to call the office if any problems or questions arise in the meantime.  Landis Martins, DPM

## 2016-04-18 ENCOUNTER — Ambulatory Visit (INDEPENDENT_AMBULATORY_CARE_PROVIDER_SITE_OTHER): Payer: Medicare Other

## 2016-04-18 DIAGNOSIS — Z5181 Encounter for therapeutic drug level monitoring: Secondary | ICD-10-CM

## 2016-04-18 DIAGNOSIS — I4891 Unspecified atrial fibrillation: Secondary | ICD-10-CM | POA: Diagnosis not present

## 2016-04-18 LAB — POCT INR: INR: 2

## 2016-04-21 ENCOUNTER — Ambulatory Visit: Payer: Medicare Other | Admitting: Endocrinology

## 2016-04-23 ENCOUNTER — Ambulatory Visit (INDEPENDENT_AMBULATORY_CARE_PROVIDER_SITE_OTHER): Payer: Medicare Other | Admitting: Endocrinology

## 2016-04-23 ENCOUNTER — Encounter: Payer: Self-pay | Admitting: Endocrinology

## 2016-04-23 VITALS — BP 140/70 | HR 64 | Ht 66.0 in | Wt 194.0 lb

## 2016-04-23 DIAGNOSIS — Z794 Long term (current) use of insulin: Secondary | ICD-10-CM | POA: Diagnosis not present

## 2016-04-23 DIAGNOSIS — E1165 Type 2 diabetes mellitus with hyperglycemia: Secondary | ICD-10-CM | POA: Diagnosis not present

## 2016-04-23 LAB — POCT GLYCOSYLATED HEMOGLOBIN (HGB A1C): HEMOGLOBIN A1C: 6.9

## 2016-04-23 NOTE — Progress Notes (Signed)
Patient ID: [name], female   DOB: 05-09-1933, 83 y.o.   MRN: [id number]    Reason for Appointment: Follow-up for Type 2 Diabetes  Referring physician: Jordan Hawks Rankins  History of Present Illness:          Diagnosis: Type 2 diabetes mellitus, date of diagnosis: ? 39 years ago        Past history: She was initially treated with Metformin but this was done because it caused GI side effects  May also tried Actos at some point, probably started because of the fear of side effects but did not know if it helped Actos stopped ? Reason Over the last few years she has been taking Precose with meals and also Januvia in increasing doses Apparently her blood sugars have been higher since 9/14 when she had abdominal surgery and before that they were as low as 70. On her initial consultation in 6/15 because of her renal dysfunction her medication regimen was changed. Previously was taking Januvia, Precose and glipizide without adequate control; her blood sugars were occasionally over 200 fasting and A1c was 9.4 She was started on Victoza 0.6 mg and Lantus insulin 8 units a day but the Lantus had to be stopped because of relatively low fasting glucose.  Since she had tried Prandin and glipizide and her postprandial readings were as high as 330 she was started on Humalog at mealtimes in 8/15  Recent history:   INSULIN regimen: Humalog 6 units U acb, 6  acs  She has been taking small doses of Humalog  at breakfast and supper  Also taking Victoza at bedtime which is usually controlling her fasting readings   Her last A1c was 8.3 compared to 6.9 because of high sugars from steroids  Current blood sugar patterns, current management  and problems identified:  She has had variable blood sugars  Again she checks her blood sugars mostly at bedtime is about 3-5 hours after eating   She does not know why her sugars were higher about 3 weeks ago at night but they are excellent now after  supper in the evenings.  Checking blood sugars only sporadically in the mornings and afternoons  Also had high readings after lunch last month, not taking any insulin to cover her half a sandwich at lunch  She thinks she is eating only one serving of carbohydrate at suppertime and usually will take 7 units, may take 8 units if eating a larger meal  She thinks her blood sugars are low when they are down to 116 or so but not clear if she is symptomatic.  She sometimes feels a little weak at this is nonspecific and no other associated symptoms    She has a relatively large breakfast, sometimes high in fat also  Usually eating a 1/2 sandwich at lunch       Oral hypoglycemic drugs the patient is taking are: none  Side effects from medications have been: None  Glucose monitoring:  done once  a day        Glucometer:  One Touch Verio Readings by download  Mean values apply above for all meters except median for One Touch  PRE-MEAL Fasting Lunch Dinner Bedtime Overall  Glucose range: ?  119 99-247   Mean/median:    150     Self-care:   Meals: 3 meals per day.   Breakfast: eggs, grits,meat;  sometimes will have spaghetti or rice at supper. Dinner 6 pm  Exercise:  none, unable to         Dietician visit: Most recent: Unknown.                Weight history:  Wt Readings from Last 3 Encounters:  04/23/16 194 lb (88 kg)  12/25/15 183 lb 3.2 oz (83.1 kg)  12/11/15 186 lb (84.4 kg)   Glycemic control:  Lab Results  Component Value Date   HGBA1C 6.9 04/23/2016   HGBA1C 8.0 (H) 12/11/2015   HGBA1C 8.3 07/31/2015   Lab Results  Component Value Date   MICROALBUR 0.7 09/11/2015   LDLCALC 51 09/11/2015   CREATININE 1.99 (H) 02/05/2016     Allergies as of 04/23/2016      Reactions   Lotensin [benazepril Hcl] Anaphylaxis   Zantac [ranitidine Hcl] Anaphylaxis   Tape Itching, Rash   Please use "paper" tape only.      Medication List       Accurate as of 04/23/16  1:22  PM. Always use your most recent med list.          amiodarone 200 MG tablet Commonly known as:  PACERONE Take 0.5 tablets (100 mg total) by mouth daily.   amLODipine 10 MG tablet Commonly known as:  NORVASC Take 5 mg by mouth daily.   atorvastatin 40 MG tablet Commonly known as:  LIPITOR Take 40 mg by mouth daily.   B-D UF III MINI PEN NEEDLES 31G X 5 MM Misc Generic drug:  Insulin Pen Needle USE 3 PEN NEEDLES PER DAY   CENTRUM SILVER ADULT 50+ Tabs Take 1 tablet by mouth daily.   cephALEXin 500 MG capsule Commonly known as:  KEFLEX TAKE 1 CAPSULE EVERY 12 HRS ORALLY FOR 14 DAYS   Cranberry 250 MG Caps Take 250 mg by mouth daily.   econazole nitrate 1 % cream Apply 1 application topically daily as needed (to groin rash as needed).   esomeprazole 20 MG capsule Commonly known as:  NEXIUM Take 20 mg by mouth daily before breakfast.   ferrous sulfate 325 (65 FE) MG tablet Take 325 mg by mouth daily with breakfast.   fluticasone 50 MCG/ACT nasal spray Commonly known as:  FLONASE Place 2 sprays into the nose daily as needed for allergies.   furosemide 80 MG tablet Commonly known as:  LASIX Take 1 tablet (80 mg total) by mouth daily.   glucose blood test strip Commonly known as:  ONETOUCH VERIO USE ONE STRIP TO CHECK GLUCOSE TWICE DAILY AS DIRECTED   HUMALOG KWIKPEN 100 UNIT/ML KiwkPen Generic drug:  insulin lispro INJECT 7 UNITS SUBCUTANEOUSLY IN THE MORNING AND 8 UNITS IN THE EVENING   metolazone 5 MG tablet Commonly known as:  ZAROXOLYN Take 5 mg by mouth daily as needed for fluid.   metoprolol 50 MG tablet Commonly known as:  LOPRESSOR Take 50 mg by mouth 2 (two) times daily.   ONETOUCH DELICA LANCETS FINE Misc Use to check blood sugar 2 times per day dx code 250.02   pantoprazole 40 MG tablet Commonly known as:  PROTONIX Take 40 mg by mouth daily.   polyethylene glycol packet Commonly known as:  MIRALAX / GLYCOLAX Take 17 g by mouth daily as  needed for mild constipation.   potassium chloride 10 MEQ tablet Commonly known as:  KLOR-CON 10 Take 3 tablets in the am and 3 tablets in the pm   VICTOZA 18 MG/3ML Sopn Generic drug:  liraglutide INJECT 1.2MG  SUBCUTANEOUSLY DAILY   warfarin 5  MG tablet Commonly known as:  COUMADIN TAKE AS DIRECTED BY COUMADIN CLINIC       Allergies:  Allergies  Allergen Reactions  . Lotensin [Benazepril Hcl] Anaphylaxis  . Zantac [Ranitidine Hcl] Anaphylaxis  . Tape Itching and Rash    Please use "paper" tape only.    Past Medical History:  Diagnosis Date  . Diabetes mellitus without complication ([place])   . Fall at home Sept. 3, 2016   Fx  3 ribs  . Hypertension   . Ileus, postoperative ([place]) 12/17/2012  . Irregular heart beat   . Renal disorder     Past Surgical History:  Procedure Laterality Date  . CATARACT EXTRACTION, BILATERAL    . HERNIA REPAIR    . INSERTION OF MESH N/A 12/12/2012   Procedure: INSERTION OF MESH;  Surgeon: Madilyn Hook, DO;  Location: [facility];  Service: General;  Laterality: N/A;  . LAPAROSCOPIC LYSIS OF ADHESIONS N/A 12/12/2012   Procedure: LAPAROSCOPIC LYSIS OF ADHESIONS;  Surgeon: Madilyn Hook, DO;  Location: [facility];  Service: General;  Laterality: N/A;  . Right Lumpectomy    . VENTRAL HERNIA REPAIR N/A 12/12/2012   Procedure: LAPAROSCOPIC VENTRAL HERNIA;  Surgeon: Madilyn Hook, DO;  Location: [facility];  Service: General;  Laterality: N/A;    Family History  Problem Relation Age of Onset  . Diabetes Mother   . Diabetes Sister     Social History:  reports that she has quit smoking. Her smoking use included Cigarettes. She has never used smokeless tobacco. She reports that she does not drink alcohol or use drugs.    Review of Systems        Lipids: She has been on high dose Lipitor for hyperlipidemia, Recent labs not available      Lab Results  Component Value Date   CHOL 125 09/11/2015   HDL 56.70 09/11/2015   LDLCALC 51 09/11/2015   LDLDIRECT  37.8 10/27/2013   TRIG 86.0 09/11/2015   CHOLHDL 2 09/11/2015       The blood pressure has been treated with multiple medications including 10 mg amlodipine  Followed by other physicians  BP at home: not checked  BP Readings from Last 3 Encounters:  04/23/16 140/70  12/25/15 (!) 132/56  12/11/15 (!) 115/50     On amiodarone with normal thyroid functions:  Lab Results  Component Value Date   TSH 1.41 12/25/2015       She has had history of swelling of legs, mostly on the left and is on Lasix;managed by PCP and nephrologist      Chronic kidney disease followed by nephrologist regularly Also seen by PCP recently, labs not available  Lab Results  Component Value Date   CREATININE 1.99 (H) 02/05/2016   Last diabetic foot exam in 2/17     She has weakness in her legs since 2014; needs a walker and cannot walk very long  Physical Examination:  BP 140/70   Pulse 64   Ht 5\' 6"  (1.676 m)   Wt 194 lb (88 kg)   SpO2 95%   BMI 31.31 kg/m     ASSESSMENT:  Diabetes type 2, uncontrolled   See history of present illness for detailed discussion of his current management, blood sugar patterns and problems identified  She has Recently better control However checking blood sugars mostly around bedtime instead of rotating the checking times and also not during the readings 2 hours after eating as directed Although she has cut back on her  suppertime dose by 1-2 units her bedtime readings are still not unusually high and tend to fluctuate without obvious change in diet  She usually compliant with her Victoza and  mealtime insulin at breakfast and supper Fasting readings need to be checked  RENAL dysfunction: Followed by nephrologist and PCP  HYPERTENSION: Her blood pressure is fairly good  PLAN:  She will try to change the timing of her glucose monitoring and do various readings in the morning or after breakfast Go back to 7 units for breakfast and Humalog Continue  consistent dose of Victoza every day She will call if she has consistently high or low readings Discussed blood sugar targets  Patient Instructions  7 Units in am  More sugars in am or after breakfast    [name] 04/23/2016, 1:22 PM   Note: This office note was prepared with Estate agent. Any transcriptional errors that result from this process are unintentional.

## 2016-04-23 NOTE — Patient Instructions (Signed)
7 Units in am  More sugars in am or after breakfast

## 2016-05-08 ENCOUNTER — Ambulatory Visit (INDEPENDENT_AMBULATORY_CARE_PROVIDER_SITE_OTHER): Payer: Medicare Other | Admitting: *Deleted

## 2016-05-08 DIAGNOSIS — I4891 Unspecified atrial fibrillation: Secondary | ICD-10-CM | POA: Diagnosis not present

## 2016-05-08 DIAGNOSIS — Z5181 Encounter for therapeutic drug level monitoring: Secondary | ICD-10-CM | POA: Diagnosis not present

## 2016-05-08 LAB — POCT INR: INR: 2.3

## 2016-05-13 IMAGING — CT CT HEAD W/O CM
4 of 7 series · 15 of 47 positions shown, 17 images · non-contrast
Comparison: None.

CLINICAL DATA: Head injury after fall at home. No loss of
consciousness.

EXAM:
CT HEAD WITHOUT CONTRAST
CT CERVICAL SPINE WITHOUT CONTRAST
TECHNIQUE: Multidetector CT imaging of the head and cervical spine was
performed following the standard protocol without intravenous
contrast. Multiplanar CT image reconstructions of the cervical spine
were also generated.

[Series 3: head 2.0 h70h · axial · 0.46mm/px · z∈[+1332,+1446]mm · 7 of 77 slices shown, 9 images]
[im 10/77  brain]
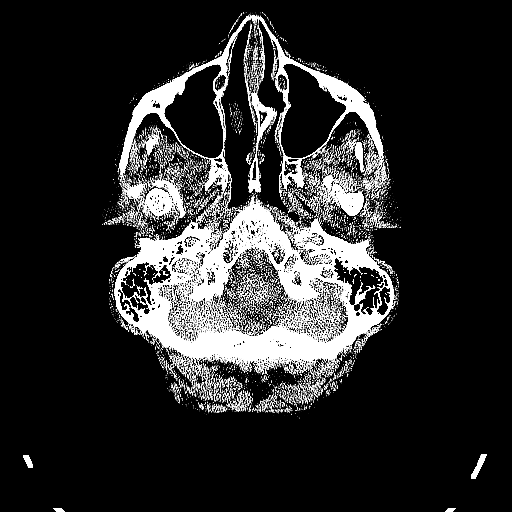
[im 10/77  bone]
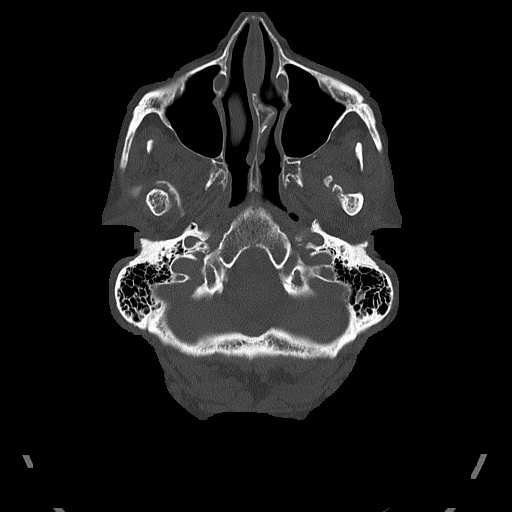
[im 20/77  brain]
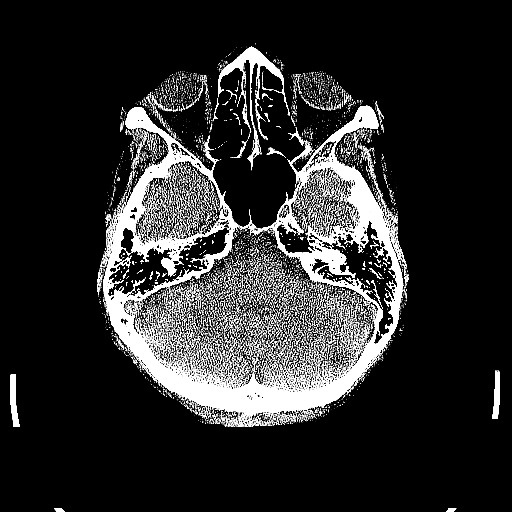
[im 29/77  brain]
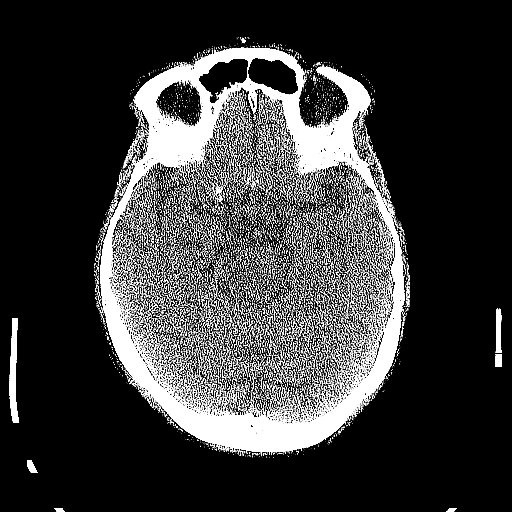
[im 39/77  brain]
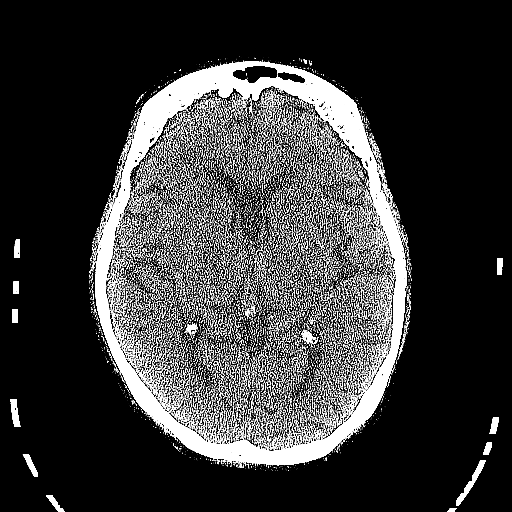
[im 48/77  brain]
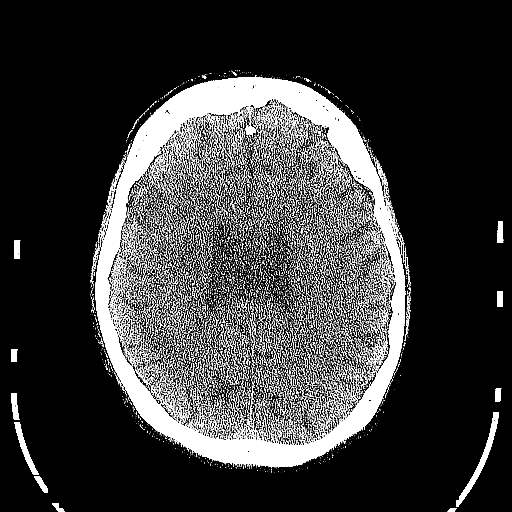
[im 48/77  bone]
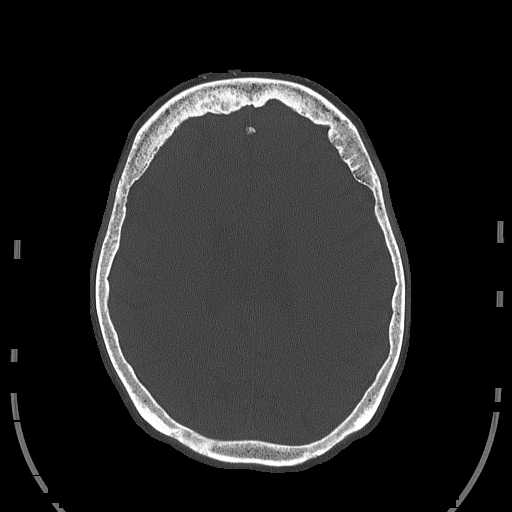
[im 58/77  brain]
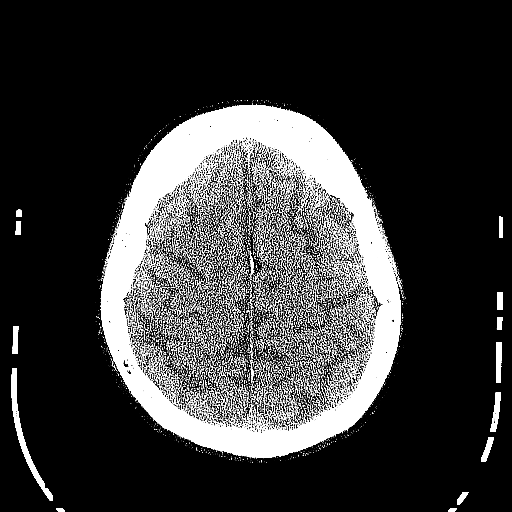
[im 67/77  brain]
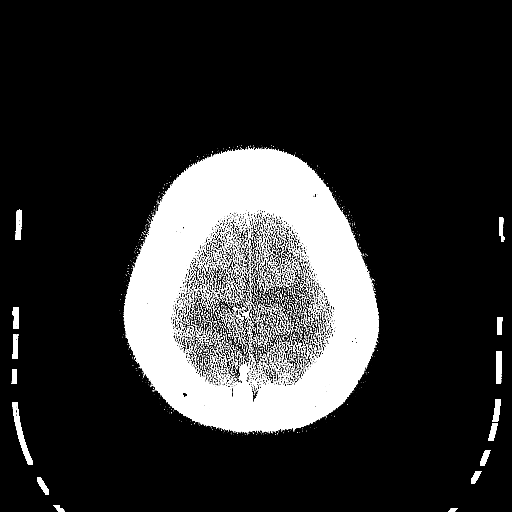

[Series 7: coronals · coronal · 0.26mm/px · 3 of 59 slices shown]
[im 12/59  brain]
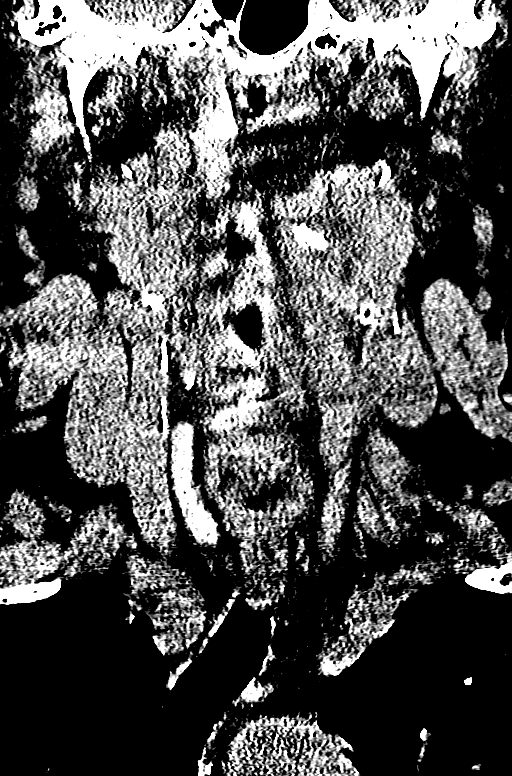
[im 24/59  brain]
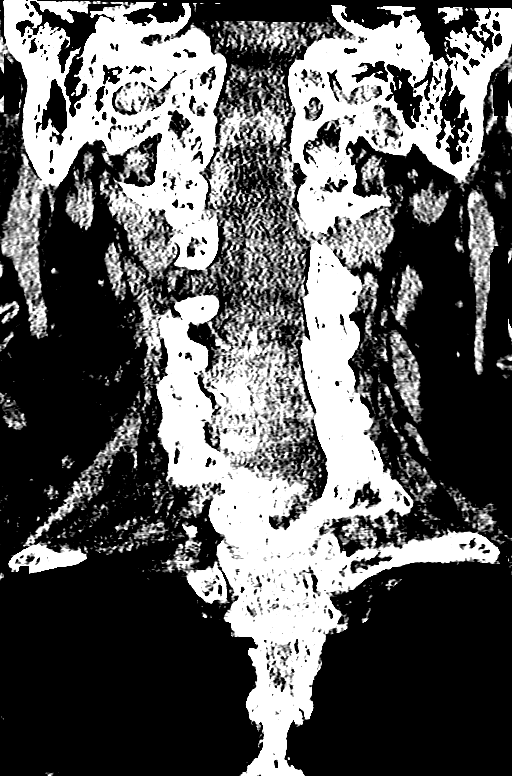
[im 35/59  brain]
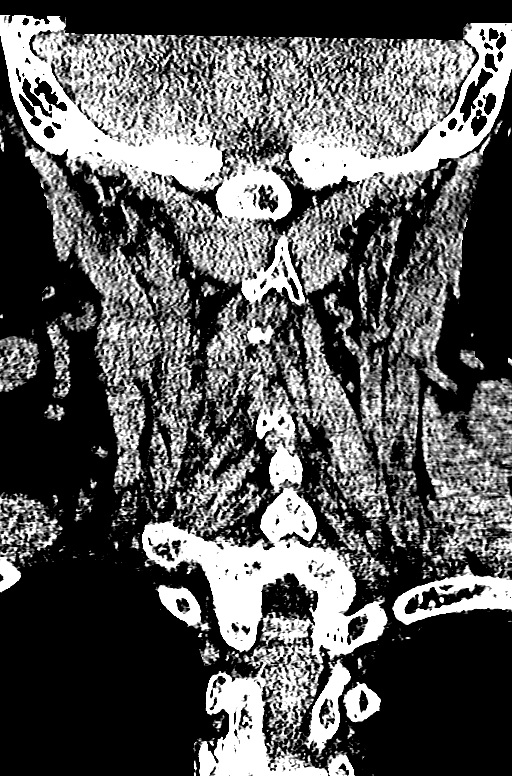

[Series 8: sagittals · sagittal · 0.30mm/px · 3 of 71 slices shown]
[im 24/71  brain]
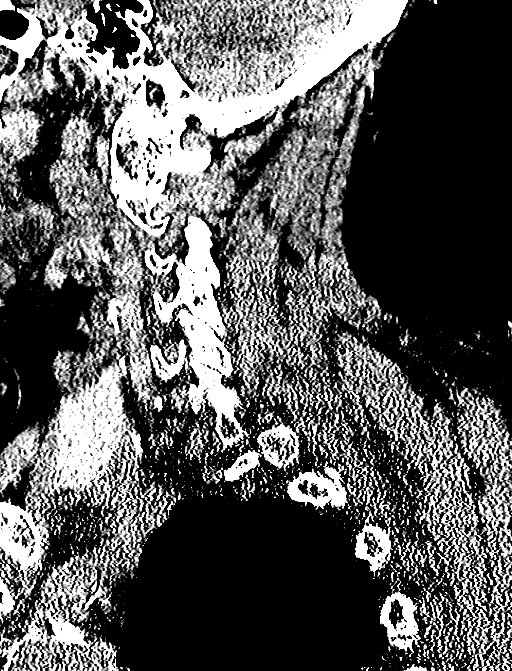
[im 36/71  brain]
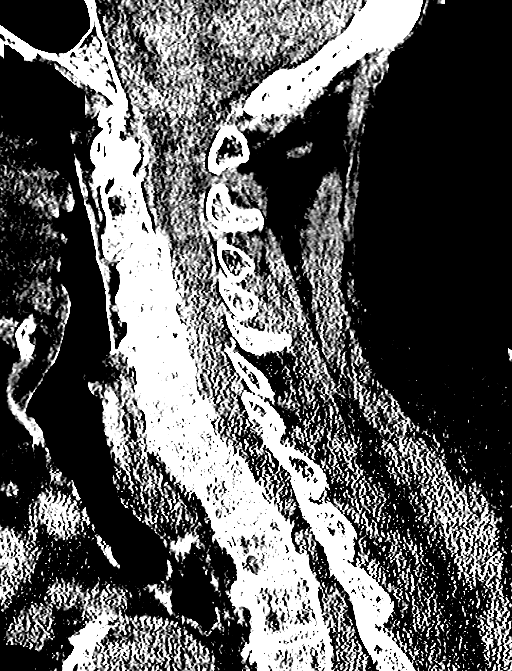
[im 47/71  brain]
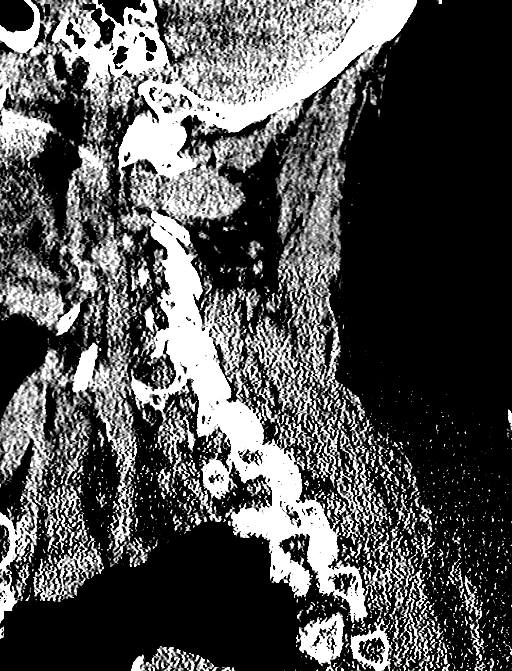

[Series 603: axial mpr lower · axial · 0.40mm/px · z∈[+1119,+1137]mm · 2 of 53 slices shown]
[im 11/53  brain]
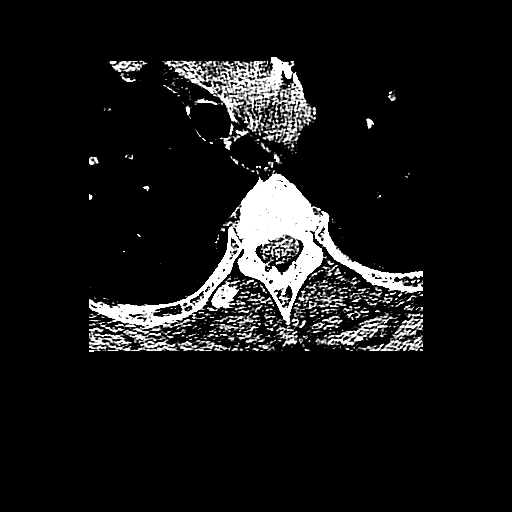
[im 21/53  brain]
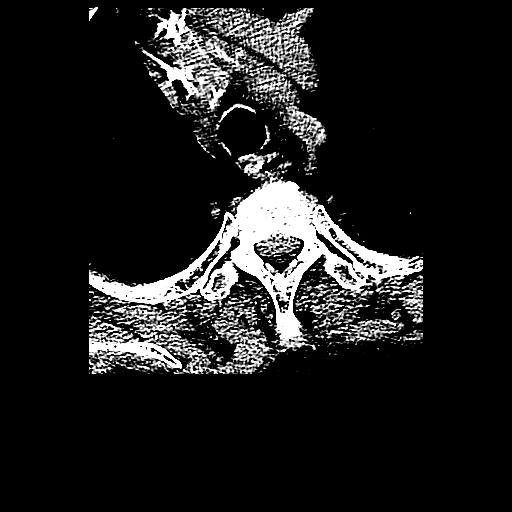

[15 of 47 positions shown; findings below may reference images not displayed]

FINDINGS: CT HEAD FINDINGS

Bony calvarium appears intact. Mild diffuse cortical atrophy is
noted. Mild chronic ischemic white matter disease is noted. No mass
effect or midline shift is noted. Ventricular size is within normal
limits. There is no evidence of mass lesion, hemorrhage or acute
infarction.

CT CERVICAL SPINE FINDINGS

No fracture is noted. Grade 1 anterolisthesis of C2-3 is noted.
Severe degenerative disc disease is noted at C3-4, C4-5 and C5-6
with anterior and posterior osteophyte formation. Moderate
degenerative disc disease is noted at C6-7. Visualized lung apices
appear normal.
IMPRESSION: Mild diffuse cortical atrophy. Mild chronic ischemic white matter
disease. No acute intracranial abnormality seen.

Severe multilevel degenerative disc disease is noted in the cervical
spine. No fracture or other acute abnormality is noted.

## 2016-05-15 ENCOUNTER — Encounter (HOSPITAL_COMMUNITY): Payer: Self-pay

## 2016-05-15 ENCOUNTER — Emergency Department (HOSPITAL_COMMUNITY): Payer: Medicare Other

## 2016-05-15 ENCOUNTER — Inpatient Hospital Stay (HOSPITAL_COMMUNITY)
Admission: EM | Admit: 2016-05-15 | Discharge: 2016-05-22 | DRG: 629 | Disposition: A | Discharge status: Home / Self Care | Payer: Medicare Other | Attending: Family Medicine | Admitting: Family Medicine

## 2016-05-15 DIAGNOSIS — Z87891 Personal history of nicotine dependence: Secondary | ICD-10-CM | POA: Diagnosis not present

## 2016-05-15 DIAGNOSIS — E1169 Type 2 diabetes mellitus with other specified complication: Principal | ICD-10-CM | POA: Diagnosis present

## 2016-05-15 DIAGNOSIS — E876 Hypokalemia: Secondary | ICD-10-CM | POA: Diagnosis present

## 2016-05-15 DIAGNOSIS — D649 Anemia, unspecified: Secondary | ICD-10-CM | POA: Diagnosis present

## 2016-05-15 DIAGNOSIS — I480 Paroxysmal atrial fibrillation: Secondary | ICD-10-CM | POA: Diagnosis not present

## 2016-05-15 DIAGNOSIS — L02611 Cutaneous abscess of right foot: Secondary | ICD-10-CM | POA: Diagnosis not present

## 2016-05-15 DIAGNOSIS — I130 Hypertensive heart and chronic kidney disease with heart failure and stage 1 through stage 4 chronic kidney disease, or unspecified chronic kidney disease: Secondary | ICD-10-CM | POA: Diagnosis not present

## 2016-05-15 DIAGNOSIS — Z7901 Long term (current) use of anticoagulants: Secondary | ICD-10-CM

## 2016-05-15 DIAGNOSIS — S91301A Unspecified open wound, right foot, initial encounter: Secondary | ICD-10-CM | POA: Diagnosis not present

## 2016-05-15 DIAGNOSIS — Z9842 Cataract extraction status, left eye: Secondary | ICD-10-CM | POA: Diagnosis not present

## 2016-05-15 DIAGNOSIS — Z833 Family history of diabetes mellitus: Secondary | ICD-10-CM | POA: Diagnosis not present

## 2016-05-15 DIAGNOSIS — Z888 Allergy status to other drugs, medicaments and biological substances status: Secondary | ICD-10-CM

## 2016-05-15 DIAGNOSIS — I10 Essential (primary) hypertension: Secondary | ICD-10-CM

## 2016-05-15 DIAGNOSIS — Z7951 Long term (current) use of inhaled steroids: Secondary | ICD-10-CM

## 2016-05-15 DIAGNOSIS — I5032 Chronic diastolic (congestive) heart failure: Secondary | ICD-10-CM | POA: Diagnosis present

## 2016-05-15 DIAGNOSIS — I129 Hypertensive chronic kidney disease with stage 1 through stage 4 chronic kidney disease, or unspecified chronic kidney disease: Secondary | ICD-10-CM | POA: Diagnosis not present

## 2016-05-15 DIAGNOSIS — I4891 Unspecified atrial fibrillation: Secondary | ICD-10-CM | POA: Diagnosis present

## 2016-05-15 DIAGNOSIS — E785 Hyperlipidemia, unspecified: Secondary | ICD-10-CM | POA: Diagnosis present

## 2016-05-15 DIAGNOSIS — L89152 Pressure ulcer of sacral region, stage 2: Secondary | ICD-10-CM | POA: Diagnosis not present

## 2016-05-15 DIAGNOSIS — E0822 Diabetes mellitus due to underlying condition with diabetic chronic kidney disease: Secondary | ICD-10-CM | POA: Diagnosis not present

## 2016-05-15 DIAGNOSIS — M868X7 Other osteomyelitis, ankle and foot: Secondary | ICD-10-CM | POA: Diagnosis not present

## 2016-05-15 DIAGNOSIS — L03818 Cellulitis of other sites: Secondary | ICD-10-CM

## 2016-05-15 DIAGNOSIS — M79674 Pain in right toe(s): Secondary | ICD-10-CM

## 2016-05-15 DIAGNOSIS — E1129 Type 2 diabetes mellitus with other diabetic kidney complication: Secondary | ICD-10-CM | POA: Diagnosis present

## 2016-05-15 DIAGNOSIS — L8990 Pressure ulcer of unspecified site, unspecified stage: Secondary | ICD-10-CM | POA: Insufficient documentation

## 2016-05-15 DIAGNOSIS — Z9841 Cataract extraction status, right eye: Secondary | ICD-10-CM | POA: Diagnosis not present

## 2016-05-15 DIAGNOSIS — Z794 Long term (current) use of insulin: Secondary | ICD-10-CM

## 2016-05-15 DIAGNOSIS — M1A9XX1 Chronic gout, unspecified, with tophus (tophi): Secondary | ICD-10-CM | POA: Diagnosis present

## 2016-05-15 DIAGNOSIS — N184 Chronic kidney disease, stage 4 (severe): Secondary | ICD-10-CM | POA: Diagnosis not present

## 2016-05-15 DIAGNOSIS — K219 Gastro-esophageal reflux disease without esophagitis: Secondary | ICD-10-CM | POA: Diagnosis not present

## 2016-05-15 DIAGNOSIS — E1122 Type 2 diabetes mellitus with diabetic chronic kidney disease: Secondary | ICD-10-CM | POA: Diagnosis not present

## 2016-05-15 DIAGNOSIS — E1165 Type 2 diabetes mellitus with hyperglycemia: Secondary | ICD-10-CM | POA: Diagnosis present

## 2016-05-15 DIAGNOSIS — L03031 Cellulitis of right toe: Secondary | ICD-10-CM | POA: Diagnosis not present

## 2016-05-15 DIAGNOSIS — L02415 Cutaneous abscess of right lower limb: Secondary | ICD-10-CM | POA: Diagnosis not present

## 2016-05-15 DIAGNOSIS — Z889 Allergy status to unspecified drugs, medicaments and biological substances status: Secondary | ICD-10-CM

## 2016-05-15 LAB — COMPREHENSIVE METABOLIC PANEL
ALK PHOS: 51 U/L (ref 38–126)
ALT: 18 U/L (ref 14–54)
ANION GAP: 8 (ref 5–15)
AST: 19 U/L (ref 15–41)
Albumin: 3.1 g/dL — ABNORMAL LOW (ref 3.5–5.0)
BILIRUBIN TOTAL: 0.5 mg/dL (ref 0.3–1.2)
BUN: 32 mg/dL — ABNORMAL HIGH (ref 6–20)
CALCIUM: 8.9 mg/dL (ref 8.9–10.3)
CO2: 25 mmol/L (ref 22–32)
CREATININE: 1.74 mg/dL — AB (ref 0.44–1.00)
Chloride: 106 mmol/L (ref 101–111)
GFR calc Af Amer: 31 mL/min — ABNORMAL LOW (ref >60)
GFR, EST NON AFRICAN AMERICAN: 27 mL/min — AB (ref >60)
Glucose, Bld: 141 mg/dL — ABNORMAL HIGH (ref 65–99)
Potassium: 3.6 mmol/L (ref 3.5–5.1)
Sodium: 139 mmol/L (ref 135–145)
TOTAL PROTEIN: 7 g/dL (ref 6.5–8.1)

## 2016-05-15 LAB — URINALYSIS, ROUTINE W REFLEX MICROSCOPIC
BILIRUBIN URINE: NEGATIVE
Glucose, UA: NEGATIVE mg/dL
Hgb urine dipstick: NEGATIVE
KETONES UR: NEGATIVE mg/dL
LEUKOCYTES UA: NEGATIVE
NITRITE: NEGATIVE
Protein, ur: NEGATIVE mg/dL
SPECIFIC GRAVITY, URINE: 1.009 (ref 1.005–1.030)
pH: 5 (ref 5.0–8.0)

## 2016-05-15 LAB — PROTIME-INR
INR: 2.39
INR: 2.48
PROTHROMBIN TIME: 26.5 s — AB (ref 11.4–15.2)
PROTHROMBIN TIME: 27.3 s — AB (ref 11.4–15.2)

## 2016-05-15 LAB — CBC WITH DIFFERENTIAL/PLATELET
BASOS ABS: 0 10*3/uL (ref 0.0–0.1)
BASOS PCT: 0 %
EOS ABS: 0.2 10*3/uL (ref 0.0–0.7)
Eosinophils Relative: 2 %
HCT: 36.8 % (ref 36.0–46.0)
Hemoglobin: 12.1 g/dL (ref 12.0–15.0)
Lymphocytes Relative: 13 %
Lymphs Abs: 1.7 10*3/uL (ref 0.7–4.0)
MCH: 26.7 pg (ref 26.0–34.0)
MCHC: 32.9 g/dL (ref 30.0–36.0)
MCV: 81.1 fL (ref 78.0–100.0)
MONOS PCT: 12 %
Monocytes Absolute: 1.5 10*3/uL — ABNORMAL HIGH (ref 0.1–1.0)
NEUTROS ABS: 9.3 10*3/uL — AB (ref 1.7–7.7)
NEUTROS PCT: 73 %
Platelets: 254 10*3/uL (ref 150–400)
RBC: 4.54 MIL/uL (ref 3.87–5.11)
RDW: 13.8 % (ref 11.5–15.5)
WBC: 12.7 10*3/uL — ABNORMAL HIGH (ref 4.0–10.5)

## 2016-05-15 LAB — GLUCOSE, CAPILLARY: Glucose-Capillary: 220 mg/dL — ABNORMAL HIGH (ref 65–99)

## 2016-05-15 LAB — I-STAT CG4 LACTIC ACID, ED: Lactic Acid, Venous: 1.12 mmol/L (ref 0.5–1.9)

## 2016-05-15 MED ORDER — SODIUM CHLORIDE 0.9% FLUSH: 3.0000 mL | INTRAVENOUS | Status: DC | PRN

## 2016-05-15 MED ORDER — FLUTICASONE PROPIONATE 50 MCG/ACT NA SUSP
2.0000 | Freq: Every day | NASAL | Status: DC | PRN
  Filled 2016-05-15: qty 16

## 2016-05-15 MED ORDER — ALBUTEROL SULFATE (2.5 MG/3ML) 0.083% IN NEBU: 2.5000 mg | INHALATION_SOLUTION | RESPIRATORY_TRACT | Status: DC | PRN

## 2016-05-15 MED ORDER — ADULT MULTIVITAMIN W/MINERALS CH
1.0000 | ORAL_TABLET | Freq: Every day | ORAL | Status: DC
  Administered 2016-05-16 – 2016-05-22 (×6): 1 via ORAL
  Filled 2016-05-15 (×7): qty 1

## 2016-05-15 MED ORDER — FERROUS SULFATE 325 (65 FE) MG PO TABS
325.0000 mg | ORAL_TABLET | Freq: Every day | ORAL | Status: DC
  Administered 2016-05-16 – 2016-05-22 (×6): 325 mg via ORAL
  Filled 2016-05-15 (×6): qty 1

## 2016-05-15 MED ORDER — TRAMADOL HCL 50 MG PO TABS
50.0000 mg | ORAL_TABLET | Freq: Four times a day (QID) | ORAL | Status: DC | PRN
  Administered 2016-05-15 – 2016-05-19 (×8): 50 mg via ORAL
  Filled 2016-05-15 (×8): qty 1

## 2016-05-15 MED ORDER — FUROSEMIDE 40 MG PO TABS
80.0000 mg | ORAL_TABLET | Freq: Every day | ORAL | Status: DC
  Administered 2016-05-16 – 2016-05-21 (×5): 80 mg via ORAL
  Filled 2016-05-15 (×5): qty 2

## 2016-05-15 MED ORDER — ACETAMINOPHEN 650 MG RE SUPP: 650.0000 mg | Freq: Four times a day (QID) | RECTAL | Status: DC | PRN

## 2016-05-15 MED ORDER — POLYETHYLENE GLYCOL 3350 17 G PO PACK: 17.0000 g | PACK | Freq: Every day | ORAL | Status: DC | PRN

## 2016-05-15 MED ORDER — CRANBERRY 250 MG PO CAPS: 750.0000 mg | ORAL_CAPSULE | Freq: Every day | ORAL | Status: DC

## 2016-05-15 MED ORDER — INSULIN ASPART 100 UNIT/ML ~~LOC~~ SOLN
0.0000 [IU] | Freq: Three times a day (TID) | SUBCUTANEOUS | Status: DC
  Administered 2016-05-16 (×2): 2 [IU] via SUBCUTANEOUS
  Administered 2016-05-16 – 2016-05-17 (×3): 1 [IU] via SUBCUTANEOUS
  Administered 2016-05-17: 2 [IU] via SUBCUTANEOUS
  Administered 2016-05-18: 3 [IU] via SUBCUTANEOUS
  Administered 2016-05-18: 2 [IU] via SUBCUTANEOUS

## 2016-05-15 MED ORDER — POTASSIUM CHLORIDE CRYS ER 10 MEQ PO TBCR
30.0000 meq | EXTENDED_RELEASE_TABLET | Freq: Two times a day (BID) | ORAL | Status: DC
  Administered 2016-05-15 – 2016-05-22 (×13): 30 meq via ORAL
  Filled 2016-05-15 (×16): qty 3

## 2016-05-15 MED ORDER — PANTOPRAZOLE SODIUM 40 MG PO TBEC
40.0000 mg | DELAYED_RELEASE_TABLET | Freq: Every evening | ORAL | Status: DC
  Administered 2016-05-15 – 2016-05-21 (×7): 40 mg via ORAL
  Filled 2016-05-15 (×7): qty 1

## 2016-05-15 MED ORDER — VANCOMYCIN HCL 10 G IV SOLR
1500.0000 mg | Freq: Once | INTRAVENOUS | Status: AC
  Administered 2016-05-15: 1500 mg via INTRAVENOUS
  Filled 2016-05-15: qty 1500

## 2016-05-15 MED ORDER — SODIUM CHLORIDE 0.9 % IV SOLN: 250.0000 mL | INTRAVENOUS | Status: DC | PRN

## 2016-05-15 MED ORDER — DIPHENHYDRAMINE HCL 12.5 MG/5ML PO ELIX
12.5000 mg | ORAL_SOLUTION | Freq: Three times a day (TID) | ORAL | Status: DC | PRN
  Administered 2016-05-15 – 2016-05-22 (×10): 12.5 mg via ORAL
  Filled 2016-05-15 (×11): qty 5

## 2016-05-15 MED ORDER — RISAQUAD PO CAPS
1.0000 | ORAL_CAPSULE | Freq: Every evening | ORAL | Status: DC
  Administered 2016-05-15 – 2016-05-21 (×7): 1 via ORAL
  Filled 2016-05-15 (×7): qty 1

## 2016-05-15 MED ORDER — ACETAMINOPHEN 325 MG PO TABS
650.0000 mg | ORAL_TABLET | Freq: Four times a day (QID) | ORAL | Status: DC | PRN
  Administered 2016-05-16 – 2016-05-19 (×2): 650 mg via ORAL
  Filled 2016-05-15 (×3): qty 2

## 2016-05-15 MED ORDER — INSULIN ASPART 100 UNIT/ML ~~LOC~~ SOLN
0.0000 [IU] | Freq: Every day | SUBCUTANEOUS | Status: DC
  Administered 2016-05-15 – 2016-05-18 (×2): 2 [IU] via SUBCUTANEOUS

## 2016-05-15 MED ORDER — ACETAMINOPHEN 325 MG PO TABS
650.0000 mg | ORAL_TABLET | Freq: Once | ORAL | Status: AC
  Administered 2016-05-15: 650 mg via ORAL
  Filled 2016-05-15: qty 2

## 2016-05-15 MED ORDER — FUROSEMIDE 40 MG PO TABS
40.0000 mg | ORAL_TABLET | Freq: Every day | ORAL | Status: DC
  Administered 2016-05-16 – 2016-05-21 (×6): 40 mg via ORAL
  Filled 2016-05-15 (×6): qty 1

## 2016-05-15 MED ORDER — SODIUM CHLORIDE 0.9% FLUSH
3.0000 mL | Freq: Two times a day (BID) | INTRAVENOUS | Status: DC
  Administered 2016-05-15 – 2016-05-20 (×4): 3 mL via INTRAVENOUS

## 2016-05-15 MED ORDER — METOPROLOL TARTRATE 50 MG PO TABS
50.0000 mg | ORAL_TABLET | Freq: Two times a day (BID) | ORAL | Status: DC
  Administered 2016-05-15 – 2016-05-22 (×12): 50 mg via ORAL
  Filled 2016-05-15 (×13): qty 1

## 2016-05-15 MED ORDER — WARFARIN SODIUM 5 MG PO TABS
5.0000 mg | ORAL_TABLET | Freq: Once | ORAL | Status: AC
  Administered 2016-05-15: 5 mg via ORAL
  Filled 2016-05-15: qty 1

## 2016-05-15 MED ORDER — WARFARIN - PHARMACIST DOSING INPATIENT: Freq: Every day | Status: DC

## 2016-05-15 MED ORDER — AMLODIPINE BESYLATE 5 MG PO TABS
5.0000 mg | ORAL_TABLET | Freq: Every day | ORAL | Status: DC
  Administered 2016-05-16 – 2016-05-22 (×6): 5 mg via ORAL
  Filled 2016-05-15 (×6): qty 1

## 2016-05-15 MED ORDER — PIPERACILLIN-TAZOBACTAM 3.375 G IVPB
3.3750 g | Freq: Three times a day (TID) | INTRAVENOUS | Status: DC
  Administered 2016-05-15 – 2016-05-21 (×17): 3.375 g via INTRAVENOUS
  Filled 2016-05-15 (×15): qty 50

## 2016-05-15 MED ORDER — AMIODARONE HCL 100 MG PO TABS
100.0000 mg | ORAL_TABLET | Freq: Every day | ORAL | Status: DC
  Administered 2016-05-16 – 2016-05-22 (×6): 100 mg via ORAL
  Filled 2016-05-15 (×7): qty 1

## 2016-05-15 MED ORDER — VANCOMYCIN HCL IN DEXTROSE 1-5 GM/200ML-% IV SOLN
1000.0000 mg | INTRAVENOUS | Status: DC
  Administered 2016-05-16 – 2016-05-19 (×4): 1000 mg via INTRAVENOUS
  Filled 2016-05-15 (×3): qty 200

## 2016-05-15 MED ORDER — FUROSEMIDE 40 MG PO TABS: 40.0000 mg | ORAL_TABLET | Freq: Two times a day (BID) | ORAL | Status: DC

## 2016-05-15 MED ORDER — ATORVASTATIN CALCIUM 40 MG PO TABS
40.0000 mg | ORAL_TABLET | Freq: Every evening | ORAL | Status: DC
  Administered 2016-05-15 – 2016-05-21 (×7): 40 mg via ORAL
  Filled 2016-05-15 (×7): qty 1

## 2016-05-15 MED ORDER — COLCHICINE 0.6 MG PO TABS
0.6000 mg | ORAL_TABLET | Freq: Two times a day (BID) | ORAL | Status: DC
  Administered 2016-05-15 – 2016-05-22 (×13): 0.6 mg via ORAL
  Filled 2016-05-15 (×14): qty 1

## 2016-05-15 MED ORDER — CAMPHOR-MENTHOL 0.5-0.5 % EX LOTN
TOPICAL_LOTION | CUTANEOUS | Status: DC | PRN
  Filled 2016-05-15: qty 222

## 2016-05-15 MED ORDER — PIPERACILLIN-TAZOBACTAM 3.375 G IVPB 30 MIN
3.3750 g | Freq: Once | INTRAVENOUS | Status: AC
  Administered 2016-05-15: 3.375 g via INTRAVENOUS
  Filled 2016-05-15: qty 50

## 2016-05-15 NOTE — ED Notes (Signed)
Patient transported to X-ray 

## 2016-05-15 NOTE — ED Notes (Signed)
Per Claiborne Billings, PA, blood cultures are not needed prior to antibiotic administration

## 2016-05-15 NOTE — Consult Note (Signed)
Reason for Consult:Right great toe possible infection Referring Physician: Dr. Rosana Fret is an 83 y.o. female.  HPI: 83 yo female with 4 day history of right great toe swelling and new onset tophaceous drainage from distal right great toe. She has a history of gout with frequent flare ups. Has had eruptions from tophaceous deposits in the past in her fingers and elbow. She denies fever or chills or systemic symptoms with this current episode. She saw her PCP today and was encouraged to go to the ER because of her toe  Past Medical History:  Diagnosis Date  . Diabetes mellitus without complication ([place])   . Fall at home Sept. 3, 2016   Fx  3 ribs  . Hypertension   . Ileus, postoperative ([place]) 12/17/2012  . Irregular heart beat   . Renal disorder     Past Surgical History:  Procedure Laterality Date  . CATARACT EXTRACTION, BILATERAL    . HERNIA REPAIR    . INSERTION OF MESH N/A 12/12/2012   Procedure: INSERTION OF MESH;  Surgeon: Madilyn Hook, DO;  Location: [facility];  Service: General;  Laterality: N/A;  . LAPAROSCOPIC LYSIS OF ADHESIONS N/A 12/12/2012   Procedure: LAPAROSCOPIC LYSIS OF ADHESIONS;  Surgeon: Madilyn Hook, DO;  Location: [facility];  Service: General;  Laterality: N/A;  . Right Lumpectomy    . VENTRAL HERNIA REPAIR N/A 12/12/2012   Procedure: LAPAROSCOPIC VENTRAL HERNIA;  Surgeon: Madilyn Hook, DO;  Location: [facility];  Service: General;  Laterality: N/A;    Family History  Problem Relation Age of Onset  . Diabetes Mother   . Diabetes Sister     Social History:  reports that she has quit smoking. Her smoking use included Cigarettes. She has never used smokeless tobacco. She reports that she does not drink alcohol or use drugs.  Allergies:  Allergies  Allergen Reactions  . Lotensin [Benazepril Hcl] Anaphylaxis  . Zantac [Ranitidine Hcl] Anaphylaxis  . Tape Itching and Rash    Medications: I have reviewed the patient's current medications.  Results for  orders placed or performed during the hospital encounter of 05/15/16 (from the past 48 hour(s))  Comprehensive metabolic panel     Status: Abnormal   Collection Time: 05/15/16  3:56 PM  Result Value Ref Range   Sodium 139 135 - 145 mmol/L   Potassium 3.6 3.5 - 5.1 mmol/L   Chloride 106 101 - 111 mmol/L   CO2 25 22 - 32 mmol/L   Glucose, Bld 141 (H) 65 - 99 mg/dL   BUN 32 (H) 6 - 20 mg/dL   Creatinine, Ser 1.74 (H) 0.44 - 1.00 mg/dL   Calcium 8.9 8.9 - 10.3 mg/dL   Total Protein 7.0 6.5 - 8.1 g/dL   Albumin 3.1 (L) 3.5 - 5.0 g/dL   AST 19 15 - 41 U/L   ALT 18 14 - 54 U/L   Alkaline Phosphatase 51 38 - 126 U/L   Total Bilirubin 0.5 0.3 - 1.2 mg/dL   GFR calc non Af Amer 27 (L) >60 mL/min   GFR calc Af Amer 31 (L) >60 mL/min    Comment: (NOTE) The eGFR has been calculated using the CKD EPI equation. This calculation has not been validated in all clinical situations. eGFR's persistently <60 mL/min signify possible Chronic Kidney Disease.    Anion gap 8 5 - 15  CBC with Differential     Status: Abnormal   Collection Time: 05/15/16  3:56 PM  Result  Value Ref Range   WBC 12.7 (H) 4.0 - 10.5 K/uL   RBC 4.54 3.87 - 5.11 MIL/uL   Hemoglobin 12.1 12.0 - 15.0 g/dL   HCT 36.8 36.0 - 46.0 %   MCV 81.1 78.0 - 100.0 fL   MCH 26.7 26.0 - 34.0 pg   MCHC 32.9 30.0 - 36.0 g/dL   RDW 13.8 11.5 - 15.5 %   Platelets 254 150 - 400 K/uL   Neutrophils Relative % 73 %   Neutro Abs 9.3 (H) 1.7 - 7.7 K/uL   Lymphocytes Relative 13 %   Lymphs Abs 1.7 0.7 - 4.0 K/uL   Monocytes Relative 12 %   Monocytes Absolute 1.5 (H) 0.1 - 1.0 K/uL   Eosinophils Relative 2 %   Eosinophils Absolute 0.2 0.0 - 0.7 K/uL   Basophils Relative 0 %   Basophils Absolute 0.0 0.0 - 0.1 K/uL  Urinalysis, Routine w reflex microscopic     Status: None   Collection Time: 05/15/16  3:56 PM  Result Value Ref Range   Color, Urine YELLOW YELLOW   APPearance CLEAR CLEAR   Specific Gravity, Urine 1.009 1.005 - 1.030   pH 5.0  5.0 - 8.0   Glucose, UA NEGATIVE NEGATIVE mg/dL   Hgb urine dipstick NEGATIVE NEGATIVE   Bilirubin Urine NEGATIVE NEGATIVE   Ketones, ur NEGATIVE NEGATIVE mg/dL   Protein, ur NEGATIVE NEGATIVE mg/dL   Nitrite NEGATIVE NEGATIVE   Leukocytes, UA NEGATIVE NEGATIVE  I-Stat CG4 Lactic Acid, ED     Status: None   Collection Time: 05/15/16  4:10 PM  Result Value Ref Range   Lactic Acid, Venous 1.12 0.5 - 1.9 mmol/L  Protime-INR     Status: Abnormal   Collection Time: 05/15/16  6:19 PM  Result Value Ref Range   Prothrombin Time 26.5 (H) 11.4 - 15.2 seconds   INR 2.39   Protime-INR     Status: Abnormal   Collection Time: 05/15/16  8:10 PM  Result Value Ref Range   Prothrombin Time 27.3 (H) 11.4 - 15.2 seconds   INR 2.48     Dg Foot Complete Right  Result Date: 05/15/2016 CLINICAL DATA:  RIGHT great toe discoloration and oozing is seen. Concern for osteomyelitis. EXAM: RIGHT FOOT COMPLETE - 3+ VIEW COMPARISON:  None. FINDINGS: There is diffuse soft tissue swelling. Osteopenia is noted. There is vascular calcification consistent with diabetes mellitus. There is no fracture or dislocation. There is minimal cortical irregularity of the dorsal aspect of the distal phalanx of the great toe, seen best on the oblique radiograph, which could represent early changes of osteomyelitis. IMPRESSION: Minimal cortical irregularity of the dorsal aspect of the distal phalanx great toe, which could represent early changes of osteomyelitis.If further investigation desired, and no contraindications, consider MRI. Electronically Signed   By: Staci Righter M.D.   On: 05/15/2016 16:53    ROS Blood pressure (!) 166/62, pulse 74, temperature 98.8 F (37.1 C), temperature source Oral, resp. rate 18, height _0  (1.676 m), weight 83.5 kg (184 lb 1.4 oz), SpO2 100 %. Physical Exam  WD very pleasant female in NAD LLE with chronic cellulitis lower leg without open ulcers Right foot with swelling and redness great toe  either representative of gout, infection or combination of both; has callous distal lateral aspect of great toe with drainage of chalky material consistent with gouty tophus; no palpable crepitus or fluid collection; no malodorous drainage; expressed tiny amount of pus versus tophaceous material; very tender over  IP joint and MTP joint of great toe.  X-ray- questionable bony erosion distal phalynx great toe versus degenerative change  Assessment/Plan: Great toe gouty ulceration versus infection- This is clinically acting more like gout but given the drainage and elevated white count would continue IV antibiotics, and elevation of foot. I really don't think that the x-ray represents osteomyelitis. I believe that this will respond to non-operative management as she has had similar gouty eruptions in her upper extremity that did not require surgery. May continue her Coumadin and allow her to eat tomorrow. We will follow along with you and consider surgery if there is any clinical deterioration or no response to non-op measures.  [name] 05/15/2016, 9:06 PM

## 2016-05-15 NOTE — ED Provider Notes (Signed)
[facility] DEPT Provider Note   CSN: [id number] Arrival date & time: 05/15/16  1543     History   Chief Complaint Chief Complaint  Patient presents with  . Foot Pain    HPI [name] is a 83 y.o. female who presents with right toe pain. PMH significant for A.fib on warfarin, insulin dependent Type 2 DM (A1c was 6.9 in Jan), gout (has tophi), chronic lower leg edema, CKD. She states over the past week she has had worsening great toe pain. She initially thought it was gout and was started on Colchicine. She saw her PCP today who was concerned for osteomyelitis and sent her here. She denies fever, chills. Saw Podiatrist on 1/23 who debrided her nails. She was on Keflex for chronic left leg cellulitis but is not on it currently.  HPI  Past Medical History:  Diagnosis Date  . Diabetes mellitus without complication ([place])   . Fall at home Sept. 3, 2016   Fx  3 ribs  . Hypertension   . Ileus, postoperative ([place]) 12/17/2012  . Irregular heart beat   . Renal disorder     Patient Active Problem List   Diagnosis Date Noted  . Community acquired pneumonia 12/07/2014  . Intractable pain 12/03/2014  . Type II diabetes mellitus, uncontrolled ([place]) 09/06/2013  . Chronic diastolic heart failure ([place]) 08/23/2013  . Anemia due to blood loss, chronic 08/23/2013  . Peptic esophagitis 08/23/2013  . Encounter for therapeutic drug monitoring 04/29/2013  . On amiodarone therapy 02/22/2013  . Long term current use of anticoagulant therapy 02/22/2013  . Candidal skin infection 12/23/2012  . Hyperlipidemia 12/23/2012  . GERD (gastroesophageal reflux disease) 12/23/2012  . Diabetes mellitus with renal complications ([place]) 71/08/2692  . Pulmonary edema 12/23/2012  . Ileus, postoperative ([place]) 12/17/2012  . Umbilical hernia, incarcerated - reducible 12/10/2012  . SBO (small bowel obstruction) 12/09/2012  . HTN (hypertension) 12/09/2012  . Dyslipidemia 12/09/2012  . Atrial fibrillation  ([place]) 12/09/2012  . Chronic kidney disease, stage IV (severe) ([place]) 12/09/2012    Past Surgical History:  Procedure Laterality Date  . CATARACT EXTRACTION, BILATERAL    . HERNIA REPAIR    . INSERTION OF MESH N/A 12/12/2012   Procedure: INSERTION OF MESH;  Surgeon: Madilyn Hook, DO;  Location: [facility];  Service: General;  Laterality: N/A;  . LAPAROSCOPIC LYSIS OF ADHESIONS N/A 12/12/2012   Procedure: LAPAROSCOPIC LYSIS OF ADHESIONS;  Surgeon: Madilyn Hook, DO;  Location: [facility];  Service: General;  Laterality: N/A;  . Right Lumpectomy    . VENTRAL HERNIA REPAIR N/A 12/12/2012   Procedure: LAPAROSCOPIC VENTRAL HERNIA;  Surgeon: Madilyn Hook, DO;  Location: [facility];  Service: General;  Laterality: N/A;    OB History    No data available       Home Medications    Prior to Admission medications   Medication Sig Start Date End Date Taking? Authorizing Provider  amiodarone (PACERONE) 200 MG tablet Take 0.5 tablets (100 mg total) by mouth daily. 12/25/15   Belva Crome, MD  amLODipine (NORVASC) 10 MG tablet Take 5 mg by mouth daily. 10/17/15   Historical Provider, MD  atorvastatin (LIPITOR) 40 MG tablet Take 40 mg by mouth daily.    Historical Provider, MD  B-D UF III MINI PEN NEEDLES 31G X 5 MM MISC USE 3 PEN NEEDLES PER DAY 05/21/15   Elayne Snare, MD  cephALEXin (KEFLEX) 500 MG capsule TAKE 1 CAPSULE EVERY 12 HRS ORALLY FOR 14 DAYS 04/14/16  Historical Provider, MD  Cranberry 250 MG CAPS Take 250 mg by mouth daily.    Historical Provider, MD  econazole nitrate 1 % cream Apply 1 application topically daily as needed (to groin rash as needed).    Historical Provider, MD  esomeprazole (NEXIUM) 20 MG capsule Take 20 mg by mouth daily before breakfast.     Historical Provider, MD  ferrous sulfate 325 (65 FE) MG tablet Take 325 mg by mouth daily with breakfast.    Historical Provider, MD  fluticasone (FLONASE) 50 MCG/ACT nasal spray Place 2 sprays into the nose daily as needed for allergies.     Historical Provider, MD  furosemide (LASIX) 80 MG tablet Take 1 tablet (80 mg total) by mouth daily. 12/26/15 03/25/16  Belva Crome, MD  glucose blood (ONETOUCH VERIO) test strip USE ONE STRIP TO CHECK GLUCOSE TWICE DAILY AS DIRECTED 10/24/15   Elayne Snare, MD  HUMALOG KWIKPEN 100 UNIT/ML KiwkPen INJECT 7 UNITS SUBCUTANEOUSLY IN THE MORNING AND 8 UNITS IN THE EVENING Patient taking differently: INJECT 6 UNITS SUBCUTANEOUSLY IN THE MORNING AND 6 UNITS IN THE EVENING 03/28/16   Elayne Snare, MD  metolazone (ZAROXOLYN) 5 MG tablet Take 5 mg by mouth daily as needed for fluid. 12/11/15   Historical Provider, MD  metoprolol (LOPRESSOR) 50 MG tablet Take 50 mg by mouth 2 (two) times daily.    Historical Provider, MD  Multiple Vitamins-Minerals (CENTRUM SILVER ADULT 50+) TABS Take 1 tablet by mouth daily.     Historical Provider, MD  [facility] DELICA LANCETS FINE MISC Use to check blood sugar 2 times per day dx code 250.02 09/15/13   Elayne Snare, MD  pantoprazole (PROTONIX) 40 MG tablet Take 40 mg by mouth daily. 04/10/14   Historical Provider, MD  polyethylene glycol (MIRALAX / GLYCOLAX) packet Take 17 g by mouth daily as needed for mild constipation.    Historical Provider, MD  potassium chloride (KLOR-CON 10) 10 MEQ tablet Take 3 tablets in the am and 3 tablets in the pm 12/26/15   Belva Crome, MD  VICTOZA 18 MG/3ML SOPN INJECT 1.2MG  SUBCUTANEOUSLY DAILY 12/11/15   Elayne Snare, MD  warfarin (COUMADIN) 5 MG tablet TAKE AS DIRECTED BY COUMADIN CLINIC 02/06/16   Belva Crome, MD    Family History Family History  Problem Relation Age of Onset  . Diabetes Mother   . Diabetes Sister     Social History Social History  Substance Use Topics  . Smoking status: Former Smoker    Types: Cigarettes  . Smokeless tobacco: Never Used  . Alcohol use No     Allergies   Lotensin [benazepril hcl]; Zantac [ranitidine hcl]; and Tape   Review of Systems Review of Systems  Constitutional: Negative for chills and fever.   Cardiovascular: Positive for leg swelling (chronic).  Musculoskeletal: Positive for arthralgias.  Skin: Positive for color change and wound.  Neurological: Positive for numbness.  All other systems reviewed and are negative.    Physical Exam Updated Vital Signs BP 183/77 (BP Location: Left Arm)   Pulse 72   Temp 98.9 F (37.2 C) (Oral)   Resp 16   Ht 5\' 6"  (1.676 m)   Wt 84.4 kg   SpO2 100%   BMI 30.02 kg/m   Physical Exam  Constitutional: She is oriented to person, place, and time. She appears well-developed and well-nourished. No distress.  HENT:  Head: Normocephalic and atraumatic.  Eyes: Conjunctivae are normal. Pupils are equal, round, and reactive to  light. Right eye exhibits no discharge. Left eye exhibits no discharge. No scleral icterus.  Neck: Normal range of motion.  Cardiovascular: Normal rate.   Pulmonary/Chest: Effort normal. No respiratory distress.  Abdominal: She exhibits no distension.  Musculoskeletal: She exhibits edema (2+ pitting edema bilaterally).  Left leg: Cellulitis extending from foot to mid shin  Right leg and foot: Swollen edematous foot with what appears to be purulent drainage from medial aspect of toe. Please see attached picture for details  Neurological: She is alert and oriented to person, place, and time.  Skin: Skin is warm and dry.  Psychiatric: She has a normal mood and affect. Her behavior is normal.  Nursing note and vitals reviewed.          ED Treatments / Results  Labs (all labs ordered are listed, but only abnormal results are displayed) Labs Reviewed  COMPREHENSIVE METABOLIC PANEL - Abnormal; Notable for the following:       Result Value   Glucose, Bld 141 (*)    BUN 32 (*)    Creatinine, Ser 1.74 (*)    Albumin 3.1 (*)    GFR calc non Af Amer 27 (*)    GFR calc Af Amer 31 (*)    All other components within normal limits  CBC WITH DIFFERENTIAL/PLATELET - Abnormal; Notable for the following:    WBC 12.7 (*)      Neutro Abs 9.3 (*)    Monocytes Absolute 1.5 (*)    All other components within normal limits  URINALYSIS, ROUTINE W REFLEX MICROSCOPIC  CREATININE, SERUM  PROTIME-INR  I-STAT CG4 LACTIC ACID, ED  I-STAT CG4 LACTIC ACID, ED    EKG  EKG Interpretation None       Radiology Dg Foot Complete Right  Result Date: 05/15/2016 CLINICAL DATA:  RIGHT great toe discoloration and oozing is seen. Concern for osteomyelitis. EXAM: RIGHT FOOT COMPLETE - 3+ VIEW COMPARISON:  None. FINDINGS: There is diffuse soft tissue swelling. Osteopenia is noted. There is vascular calcification consistent with diabetes mellitus. There is no fracture or dislocation. There is minimal cortical irregularity of the dorsal aspect of the distal phalanx of the great toe, seen best on the oblique radiograph, which could represent early changes of osteomyelitis. IMPRESSION: Minimal cortical irregularity of the dorsal aspect of the distal phalanx great toe, which could represent early changes of osteomyelitis.If further investigation desired, and no contraindications, consider MRI. Electronically Signed   By: Staci Righter M.D.   On: 05/15/2016 16:53    Procedures Procedures (including critical care time)  Medications Ordered in ED Medications  piperacillin-tazobactam (ZOSYN) IVPB 3.375 g (3.375 g Intravenous New Bag/Given 05/15/16 1821)  vancomycin (VANCOCIN) 1,500 mg in sodium chloride 0.9 % 500 mL IVPB (not administered)    Followed by  vancomycin (VANCOCIN) IVPB 1000 mg/200 mL premix (not administered)  acetaminophen (TYLENOL) tablet 650 mg (650 mg Oral Given 05/15/16 1822)     Initial Impression / Assessment and Plan / ED Course  I have reviewed the triage vital signs and the nursing notes.  Pertinent labs & imaging results that were available during my care of the patient were reviewed by me and considered in my medical decision making (see chart for details).  83 year old female presents with findings  concerning for osteomyelitis. She is hypertensive. Otherwise vitals are normal. CBC remarkable for leukocytosis of 12.7. CMP remarkable for SCr of 1.74 which is better than baseline. Lactic Acid is normal. She does not meet SIRS criteria. Spoke  with Dr. Maureen Ralphs who recommends antibiotics and medical admission and he will consult on patient. Vancomycin and Zosyn started. Tylenol given for pain.  Spoke with Dr. Algis Liming who will admit patient. Appreciate assistance  Final Clinical Impressions(s) / ED Diagnoses   Final diagnoses:  Great toe pain, right  Cellulitis of other specified site    New Prescriptions New Prescriptions   No medications on file     Recardo Evangelist, PA-C 05/15/16 [place], MD 05/16/16 1946

## 2016-05-15 NOTE — ED Triage Notes (Signed)
Pt sent from her PCP today. PCP wants to r/o osteomyelitis. Pt R great toe is discolored and per patient is oozing pus. She states that the pain radiates throughout her R foot. R foot is edematous. A&Ox4. Pt uses a walker to ambulate at baseline.

## 2016-05-15 NOTE — H&P (Signed)
History and Physical    [name] QPY:[id number] DOB: 05/13/1933 DOA: 05/15/2016   PCP: Aretta Nip, MD  Outpatient Specialists:  Cardiology: Dr. Daneen Schick  Nephrology: Dr. Edrick Oh Endocrinology: Dr. Elayne Snare   Patient coming from: Home   Chief Complaint: Pain, swelling, redness and drainage from right great toe.   HPI: [name] is a 83 y.o. female , Married, lives with spouse, ambulates with the help of a walker, PMH of type II DM with renal complications/IDDM, HTN, HLD, gout, A. fib on Coumadin, chronic diastolic CHF, stage IV chronic kidney disease, presented to ED on 05/15/16 due to worsening pain, swelling, redness and drainage from right great toe. She was in her usual state of health until approximately a week ago. At that time she noticed a white spot on the medial aspect of the right great toe with associated soreness. Couple days later, the spot burst oozing out whitish material. Since then she has had progressive pain, swelling, redness and drainage from that site. She denies fever or chills. Pain is described as throbbing, at times severe, worse on weightbearing. She thought that all of her symptoms were related to gout. She called PCPs office who advised her to call her nephrologist who prescribed her colchicine. There was no improvement in symptoms. She decided to go to her PCPs office today and was advised to come to the ED for further evaluation.  ED Course: Afebrile. Mildly elevated blood pressures. Lab work significant for WBC 12.7, creatinine 1.74 and x-ray of the right foot concerning for osteomyelitis. Empirically started on IV vancomycin and Zosyn. EDP consulted orthopedics. Hospitalist admission requested.   Review of Systems:  All other systems reviewed and apart from HPI, are negative.She gives history of chronic intermittent generalized pruritus of unclear etiology. She also has chronic faint redness, skin thickening of her left leg without new  changes. Denies any other symptoms.  Past Medical History:  Diagnosis Date  . Diabetes mellitus without complication ([place])   . Fall at home Sept. 3, 2016   Fx  3 ribs  . Hypertension   . Ileus, postoperative ([place]) 12/17/2012  . Irregular heart beat   . Renal disorder     Past Surgical History:  Procedure Laterality Date  . CATARACT EXTRACTION, BILATERAL    . HERNIA REPAIR    . INSERTION OF MESH N/A 12/12/2012   Procedure: INSERTION OF MESH;  Surgeon: Madilyn Hook, DO;  Location: [facility];  Service: General;  Laterality: N/A;  . LAPAROSCOPIC LYSIS OF ADHESIONS N/A 12/12/2012   Procedure: LAPAROSCOPIC LYSIS OF ADHESIONS;  Surgeon: Madilyn Hook, DO;  Location: [facility];  Service: General;  Laterality: N/A;  . Right Lumpectomy    . VENTRAL HERNIA REPAIR N/A 12/12/2012   Procedure: LAPAROSCOPIC VENTRAL HERNIA;  Surgeon: Madilyn Hook, DO;  Location: [facility];  Service: General;  Laterality: N/A;   Social history:   reports that she has quit smoking. Her smoking use included Cigarettes. She has never used smokeless tobacco. She reports that she does not drink alcohol or use drugs.  Allergies  Allergen Reactions  . Lotensin [Benazepril Hcl] Anaphylaxis  . Zantac [Ranitidine Hcl] Anaphylaxis  . Tape Itching and Rash    Family History  Problem Relation Age of Onset  . Diabetes Mother   . Diabetes Sister      Prior to Admission medications   Medication Sig Start Date End Date Taking? Authorizing Provider  acidophilus (RISAQUAD) CAPS capsule Take 1 capsule by  mouth every evening.    Yes Historical Provider, MD  amiodarone (PACERONE) 200 MG tablet Take 0.5 tablets (100 mg total) by mouth daily. 12/25/15  Yes Belva Crome, MD  amLODipine (NORVASC) 10 MG tablet Take 5 mg by mouth daily at 12 noon.    Yes Historical Provider, MD  atorvastatin (LIPITOR) 40 MG tablet Take 40 mg by mouth every evening.    Yes Historical Provider, MD  colchicine 0.6 MG tablet Take 0.6 mg by mouth 2 (two) times  daily.   Yes Historical Provider, MD  Cranberry 250 MG CAPS Take 750 mg by mouth daily.    Yes Historical Provider, MD  econazole nitrate 1 % cream Apply 1 application topically daily as needed (to groin rash).    Yes Historical Provider, MD  esomeprazole (NEXIUM) 20 MG capsule Take 20 mg by mouth daily before breakfast.    Yes Historical Provider, MD  ferrous sulfate 325 (65 FE) MG tablet Take 325 mg by mouth daily with breakfast.   Yes Historical Provider, MD  fluticasone (FLONASE) 50 MCG/ACT nasal spray Place 2 sprays into both nostrils daily as needed for rhinitis.    Yes Historical Provider, MD  furosemide (LASIX) 80 MG tablet Take 40-80 mg by mouth 2 (two) times daily. Pt takes one tablet in the morning and one-half tablet in the evening.   Yes Historical Provider, MD  HUMALOG KWIKPEN 100 UNIT/ML KiwkPen INJECT 7 UNITS SUBCUTANEOUSLY IN THE MORNING AND 8 UNITS IN THE EVENING Patient taking differently: INJECT 7 UNITS SUBCUTANEOUSLY IN THE MORNING AND 6 UNITS IN THE EVENING 03/28/16  Yes Elayne Snare, MD  metolazone (ZAROXOLYN) 5 MG tablet Take 5 mg by mouth daily as needed (for edema).    Yes Historical Provider, MD  metoprolol (LOPRESSOR) 50 MG tablet Take 50 mg by mouth 2 (two) times daily.   Yes Historical Provider, MD  Multiple Vitamin (MULTIVITAMIN WITH MINERALS) TABS tablet Take 1 tablet by mouth daily.   Yes Historical Provider, MD  pantoprazole (PROTONIX) 40 MG tablet Take 40 mg by mouth every evening.    Yes Historical Provider, MD  polyethylene glycol (MIRALAX / GLYCOLAX) packet Take 17 g by mouth daily as needed for mild constipation.   Yes Historical Provider, MD  potassium chloride (KLOR-CON 10) 10 MEQ tablet Take 3 tablets in the am and 3 tablets in the pm Patient taking differently: Take 30 mEq by mouth 2 (two) times daily.  12/26/15  Yes Belva Crome, MD  [place] 18 MG/3ML SOPN INJECT 1.2MG  SUBCUTANEOUSLY DAILY Patient taking differently: INJECT 1.2MG  SUBCUTANEOUSLY AT BEDTIME  12/11/15  Yes Elayne Snare, MD  warfarin (COUMADIN) 5 MG tablet Take 2.5-5 mg by mouth every evening. Pt takes one-half tablet on Monday and one tablet all other days.   Yes Historical Provider, MD    Physical Exam: Vitals:   05/15/16 1551 05/15/16 1552 05/15/16 1818  BP: 183/77  173/72  Pulse: 72  75  Resp: 16  16  Temp: 98.9 F (37.2 C)    TempSrc: Oral    SpO2: 100%  97%  Weight:  84.4 kg (186 lb)   Height:  5\' 6"  (1.676 m)       Constitutional: Pleasant elderly female patient lying comfortably propped up in the gurney in the ED.  Eyes: PERTLA, lids and conjunctivae normal. Bilateral arcus senilis.  ENMT: Mucous membranes are moist. Posterior pharynx clear of any exudate or lesions. Normal dentition.  Neck: normal, supple, no masses, no thyromegaly Respiratory:  clear to auscultation bilaterally, no wheezing, no crackles. Normal respiratory effort. No accessory muscle use.  Cardiovascular: S1 & S2 heard, regular rate and rhythm, no murmurs / rubs / gallops.1+ pitting bilateral leg edema. 1+ pedal pulses. No carotid bruits.  Abdomen: No distension, no tenderness, no masses palpated. No hepatosplenomegaly. Bowel sounds normal.  Musculoskeletal: no clubbing / cyanosis. No joint deformity upper and lower extremities. Good ROM, no contractures. Normal muscle tone.  Skin:Left lower extremity below mid leg shows chronic edema with hyperpigmentation of skin and thickening of skin without open wounds and no acute findings. Right great toe swollen, erythematous, darkish discoloration at places, pinhole-sized wound over the medial aspect which is extruding whitish/purulent material, surrounding fluctuance and tenderness. Some of the erythema, warmth and tenderness extends to the medial aspect of the dorsal forefoot. No crepitus. Please see pictures below for further details.  Neurologic: CN 2-12 grossly intact. Sensation intact, DTR normal. Strength 5/5 in all 4 limbs.  Psychiatric: Normal judgment  and insight. Alert and oriented x 3. Normal mood.   Right great toe on admission   The right great toe and foot on admission   Left lower extremity on admission     Labs on Admission: I have personally reviewed following labs and imaging studies  CBC:  Recent Labs Lab 05/15/16 1556  WBC 12.7*  NEUTROABS 9.3*  HGB 12.1  HCT 36.8  MCV 81.1  PLT 127   Basic Metabolic Panel:  Recent Labs Lab 05/15/16 1556  NA 139  K 3.6  CL 106  CO2 25  GLUCOSE 141*  BUN 32*  CREATININE 1.74*  CALCIUM 8.9   Liver Function Tests:  Recent Labs Lab 05/15/16 1556  AST 19  ALT 18  ALKPHOS 51  BILITOT 0.5  PROT 7.0  ALBUMIN 3.1*   Urine analysis:    Component Value Date/Time   COLORURINE YELLOW 05/15/2016 [place] 05/15/2016 1556   LABSPEC 1.009 05/15/2016 1556   PHURINE 5.0 05/15/2016 1556   GLUCOSEU NEGATIVE 05/15/2016 1556   GLUCOSEU NEGATIVE 09/11/2015 1041   [place] 05/15/2016 [place] 05/15/2016 1556   BILIRUBINUR Neg 07/27/2014 1106   [place] 05/15/2016 1556   PROTEINUR NEGATIVE 05/15/2016 1556   UROBILINOGEN 0.2 09/11/2015 1041   NITRITE NEGATIVE 05/15/2016 1556   LEUKOCYTESUR NEGATIVE 05/15/2016 1556   Radiological Exams on Admission: Dg Foot Complete Right  Result Date: 05/15/2016 CLINICAL DATA:  RIGHT great toe discoloration and oozing is seen. Concern for osteomyelitis. EXAM: RIGHT FOOT COMPLETE - 3+ VIEW COMPARISON:  None. FINDINGS: There is diffuse soft tissue swelling. Osteopenia is noted. There is vascular calcification consistent with diabetes mellitus. There is no fracture or dislocation. There is minimal cortical irregularity of the dorsal aspect of the distal phalanx of the great toe, seen best on the oblique radiograph, which could represent early changes of osteomyelitis. IMPRESSION: Minimal cortical irregularity of the dorsal aspect of the distal phalanx great toe, which could represent early  changes of osteomyelitis.If further investigation desired, and no contraindications, consider MRI. Electronically Signed   By: Staci Righter M.D.   On: 05/15/2016 16:53     Assessment/Plan Principal Problem:   Cellulitis of great toe of right foot Active Problems:   HTN (hypertension)   Dyslipidemia   Atrial fibrillation (HCC)   Chronic kidney disease, stage IV (severe) (HCC)   Hyperlipidemia   GERD (gastroesophageal reflux disease)   Diabetes mellitus with renal complications (HCC)   Chronic diastolic heart  failure ([place])     1. Right great toe cellulitis, abscess, possible osteomyelitis: Differential diagnosis: Cellulitis complicating tophaceous gout. Admitted to medical floor. Orthopedics consulted and I have discussed with Dr. Wynelle Link who will evaluate and determine need for surgical intervention. In the interim continue empirically started IV vancomycin and Zosyn. 2. Uncontrolled type II DM with renal complications: Hold Victoza. Place on NovoLog SSI. Monitor closely and depending on CBGs may consider Lantus. 3. Essential hypertension: Mildly uncontrolled. Continue home medications and monitor closely. 4. Chronic diastolic CHF: She seems to have chronic lower extremity edema but otherwise appears compensated. Continue Lasix and when necessary metolazone. 5. Stage IV chronic kidney disease: Creatinine appears to be at baseline. Follow BMP closely. 6. Gout: Her presentation may well be due to tophaceous gout with secondary cellulitis. Await orthopedic evaluation. 7. Hyperlipidemia: Continue statins. 8. Paroxysmal A. fib: Controlled ventricular rate. Continue amiodarone. Also on Coumadin at [facility] per pharmacy. Check INR. As discussed with orthopedics, they will hold if surgery planned. 9. GERD: PPI.  DVT prophylaxis: On Coumadin at home and probably anticoagulated. Await INR.  Code Status: Full  Family Communication: Discussed in detail with patient's daughter at bedside.  Updated care and answered questions.  Disposition Plan: DC home when medically stable, possibly in 3-4 days.  Consults called: Orthopedics/Dr. Lazarus Gowda  Admission status: Inpatient, medical bed.    [facility] MD [facility] Pager [phone]  If 7PM-7AM, please contact night-coverage [email or web address] Password [facility]  05/15/2016, 6:32 PM

## 2016-05-15 NOTE — Progress Notes (Signed)
Pharmacy Antibiotic Note  [name] is a 83 y.o. female admitted on 05/15/2016 with cellulitis/abscess and possible osteomyelitis of R great toe. Purulent drainage noted. Pharmacy has been consulted for vancomycin and Zosyn dosing.  Plan:  Vancomycin 1500 mg IV now, then 1000 mg IV q24 hr; goal trough 15-20 mcg/mL with possible osteo (can reduce goal if osteo r/o)  Measure vancomycin trough levels at steady state as indicated  Zosyn 3.375 g IV given once over 30 minutes, then every 8 hrs by 4-hr infusion  SCr daily x 5 days ordered given concomitant vanc + Zosyn    Height: 5\' 6"  (167.6 cm) Weight: 186 lb (84.4 kg) IBW/kg (Calculated) : 59.3  Temp (24hrs), Avg:98.9 F (37.2 C), Min:98.9 F (37.2 C), Max:98.9 F (37.2 C)   Recent Labs Lab 05/15/16 1556 05/15/16 1610  WBC 12.7*  --   CREATININE 1.74*  --   LATICACIDVEN  --  1.12    Estimated Creatinine Clearance: 28.2 mL/min (by C-G formula based on SCr of 1.74 mg/dL (H)).    Allergies  Allergen Reactions  . Lotensin [Benazepril Hcl] Anaphylaxis  . Zantac [Ranitidine Hcl] Anaphylaxis  . Tape Itching and Rash    Antimicrobials this admission: Vanc 2/22 >>  Zosyn 2/22 >>   Dose adjustments this admission: ---  Microbiology results: None sent yet  Thank you for allowing pharmacy to be a part of this patient's care.  Reuel Boom, PharmD, BCPS Pager: [phone] 05/15/2016, 7:01 PM

## 2016-05-15 NOTE — Progress Notes (Signed)
[place] for warfarin Indication: Afib  Allergies  Allergen Reactions  . Lotensin [Benazepril Hcl] Anaphylaxis  . Zantac [Ranitidine Hcl] Anaphylaxis  . Tape Itching and Rash    Patient Measurements: Height: 5\' 6"  (167.6 cm) Weight: 186 lb (84.4 kg) IBW/kg (Calculated) : 59.3  Vital Signs: Temp: 98.6 F (37 C) (02/22 1907) Temp Source: Oral (02/22 1907) BP: 168/65 (02/22 1907) Pulse Rate: 77 (02/22 1907)  Labs:  Recent Labs  05/15/16 1556 05/15/16 1819  HGB 12.1  --   HCT 36.8  --   PLT 254  --   LABPROT  --  26.5*  INR  --  2.39  CREATININE 1.74*  --     Estimated Creatinine Clearance: 28.2 mL/min (by C-G formula based on SCr of 1.74 mg/dL (H)).   Medical History: Past Medical History:  Diagnosis Date  . Diabetes mellitus without complication ([place])   . Fall at home Sept. 3, 2016   Fx  3 ribs  . Hypertension   . Ileus, postoperative ([place]) 12/17/2012  . Irregular heart beat   . Renal disorder     Medications:  Prescriptions Prior to Admission  Medication Sig Dispense Refill Last Dose  . acidophilus (RISAQUAD) CAPS capsule Take 1 capsule by mouth every evening.    05/14/2016 at Unknown time  . amiodarone (PACERONE) 200 MG tablet Take 0.5 tablets (100 mg total) by mouth daily. 90 tablet 2 05/15/2016 at Unknown time  . amLODipine (NORVASC) 10 MG tablet Take 5 mg by mouth daily at 12 noon.   1 05/15/2016 at Unknown time  . atorvastatin (LIPITOR) 40 MG tablet Take 40 mg by mouth every evening.    05/14/2016 at Unknown time  . colchicine 0.6 MG tablet Take 0.6 mg by mouth 2 (two) times daily.   05/15/2016 at Unknown time  . Cranberry 250 MG CAPS Take 750 mg by mouth daily.    05/15/2016 at Unknown time  . econazole nitrate 1 % cream Apply 1 application topically daily as needed (to groin rash).    Past Week at Unknown time  . esomeprazole (NEXIUM) 20 MG capsule Take 20 mg by mouth daily before breakfast.    05/15/2016 at Unknown  time  . ferrous sulfate 325 (65 FE) MG tablet Take 325 mg by mouth daily with breakfast.   05/15/2016 at Unknown time  . fluticasone (FLONASE) 50 MCG/ACT nasal spray Place 2 sprays into both nostrils daily as needed for rhinitis.    05/14/2016 at Unknown time  . furosemide (LASIX) 80 MG tablet Take 40-80 mg by mouth 2 (two) times daily. Pt takes one tablet in the morning and one-half tablet in the evening.   05/15/2016 at Unknown time  . HUMALOG KWIKPEN 100 UNIT/ML KiwkPen INJECT 7 UNITS SUBCUTANEOUSLY IN THE MORNING AND 8 UNITS IN THE EVENING (Patient taking differently: INJECT 7 UNITS SUBCUTANEOUSLY IN THE MORNING AND 6 UNITS IN THE EVENING) 15 mL 2 05/15/2016 at Unknown time  . metolazone (ZAROXOLYN) 5 MG tablet Take 5 mg by mouth daily as needed (for edema).   3 Past Month at Unknown time  . metoprolol (LOPRESSOR) 50 MG tablet Take 50 mg by mouth 2 (two) times daily.   05/15/2016 at 0800  . Multiple Vitamin (MULTIVITAMIN WITH MINERALS) TABS tablet Take 1 tablet by mouth daily.   05/15/2016 at Unknown time  . pantoprazole (PROTONIX) 40 MG tablet Take 40 mg by mouth every evening.   5 05/14/2016 at Unknown time  .  polyethylene glycol (MIRALAX / GLYCOLAX) packet Take 17 g by mouth daily as needed for mild constipation.   Past Month at Unknown time  . potassium chloride (KLOR-CON 10) 10 MEQ tablet Take 3 tablets in the am and 3 tablets in the pm (Patient taking differently: Take 30 mEq by mouth 2 (two) times daily. ) 180 tablet 3 05/15/2016 at Unknown time  . VICTOZA 18 MG/3ML SOPN INJECT 1.2MG  SUBCUTANEOUSLY DAILY (Patient taking differently: INJECT 1.2MG  SUBCUTANEOUSLY AT BEDTIME) 6 pen 2 05/14/2016 at Unknown time  . warfarin (COUMADIN) 5 MG tablet Take 2.5-5 mg by mouth every evening. Pt takes one-half tablet on Monday and one tablet all other days.   05/14/2016 at 1800   Scheduled:  . acidophilus  1 capsule Oral QPM  . [START ON 05/16/2016] amiodarone  100 mg Oral Daily  . [START ON 05/16/2016] amLODipine   5 mg Oral Q1200  . atorvastatin  40 mg Oral QPM  . colchicine  0.6 mg Oral BID  . [START ON 05/16/2016] ferrous sulfate  325 mg Oral Q breakfast  . [START ON 05/16/2016] furosemide  40 mg Oral q1800  . [START ON 05/16/2016] furosemide  80 mg Oral Q breakfast  . insulin aspart  0-5 Units Subcutaneous QHS  . [START ON 05/16/2016] insulin aspart  0-9 Units Subcutaneous TID WC  . metoprolol  50 mg Oral BID  . [START ON 05/16/2016] multivitamin with minerals  1 tablet Oral Daily  . pantoprazole  40 mg Oral QPM  . piperacillin-tazobactam (ZOSYN)  IV  3.375 g Intravenous Q8H  . potassium chloride  30 mEq Oral BID  . sodium chloride flush  3 mL Intravenous Q12H  . vancomycin  1,500 mg Intravenous Once   Followed by  . [START ON 05/16/2016] vancomycin  1,000 mg Intravenous Q24H   Assessment: 34 yoF known to pharmacy from antibiotic dosing. PMH DMT2, HTN, HLD, gout, AFib on warfarin, dCHF, CKD4. Admitted for cellulitis/?osteo of toe; pharmacy to dose warfarin while admitted.   Baseline INR therapeutic  Prior anticoagulation: warfarin 5 mg daily except 2.5 mg on Mondays; last dose 2/21.  Significant events:  Today, 05/15/2016:  CBC: wnl  INR therapeutic  Major drug interactions: none, on broad-spectrum abx  No bleeding issues per nursing  DM diet ordered  Goal of Therapy: INR 2-3  Plan:  Warfarin 5 mg PO tonight. Anticipate INR may rise with acute illness  Daily INR  CBC at least q72 hr while on warfarin  Monitor for signs of bleeding or thrombosis   Reuel Boom, PharmD Pager: [phone] 05/15/2016, 7:09 PM

## 2016-05-15 NOTE — Progress Notes (Signed)
PHARMACIST - PHYSICIAN ORDER COMMUNICATION  CONCERNING: P&T Medication Policy on Herbal Medications  DESCRIPTION:  This patient's order for:  Cranberry  has been noted.  This product(s) is classified as an "herbal" or natural product. Due to a lack of definitive safety studies or FDA approval, nonstandard manufacturing practices, plus the potential risk of unknown drug-drug interactions while on inpatient medications, the Pharmacy and Therapeutics Committee does not permit the use of "herbal" or natural products of this type within [facility].   ACTION TAKEN: The pharmacy department is unable to verify this order at this time and your patient has been informed of this safety policy. Please reevaluate patient's clinical condition at discharge and address if the herbal or natural product(s) should be resumed at that time.  Peggyann Juba, PharmD, BCPS Pager: [phone] 05/15/2016 7:01 PM

## 2016-05-15 NOTE — ED Notes (Signed)
Hospitalist at bedside 

## 2016-05-16 LAB — CBC
HCT: 33.4 % — ABNORMAL LOW (ref 36.0–46.0)
Hemoglobin: 11.3 g/dL — ABNORMAL LOW (ref 12.0–15.0)
MCH: 27.3 pg (ref 26.0–34.0)
MCHC: 33.8 g/dL (ref 30.0–36.0)
MCV: 80.7 fL (ref 78.0–100.0)
PLATELETS: 232 10*3/uL (ref 150–400)
RBC: 4.14 MIL/uL (ref 3.87–5.11)
RDW: 13.7 % (ref 11.5–15.5)
WBC: 11.6 10*3/uL — ABNORMAL HIGH (ref 4.0–10.5)

## 2016-05-16 LAB — BASIC METABOLIC PANEL
Anion gap: 7 (ref 5–15)
BUN: 30 mg/dL — AB (ref 6–20)
CALCIUM: 8.5 mg/dL — AB (ref 8.9–10.3)
CHLORIDE: 107 mmol/L (ref 101–111)
CO2: 25 mmol/L (ref 22–32)
Creatinine, Ser: 1.61 mg/dL — ABNORMAL HIGH (ref 0.44–1.00)
GFR calc non Af Amer: 29 mL/min — ABNORMAL LOW (ref >60)
GFR, EST AFRICAN AMERICAN: 34 mL/min — AB (ref >60)
Glucose, Bld: 138 mg/dL — ABNORMAL HIGH (ref 65–99)
Potassium: 3.7 mmol/L (ref 3.5–5.1)
Sodium: 139 mmol/L (ref 135–145)

## 2016-05-16 LAB — GLUCOSE, CAPILLARY
GLUCOSE-CAPILLARY: 152 mg/dL — AB (ref 65–99)
Glucose-Capillary: 143 mg/dL — ABNORMAL HIGH (ref 65–99)
Glucose-Capillary: 182 mg/dL — ABNORMAL HIGH (ref 65–99)
Glucose-Capillary: 161 mg/dL — ABNORMAL HIGH (ref 65–99)

## 2016-05-16 LAB — PROTIME-INR
INR: 2.54
Prothrombin Time: 27.8 seconds — ABNORMAL HIGH (ref 11.4–15.2)

## 2016-05-16 MED ORDER — WARFARIN SODIUM 5 MG PO TABS
5.0000 mg | ORAL_TABLET | Freq: Once | ORAL | Status: AC
  Administered 2016-05-16: 5 mg via ORAL
  Filled 2016-05-16: qty 1

## 2016-05-16 NOTE — Progress Notes (Signed)
   Subjective: Hospital day - 1 Patient reports pain as mild.   Patient seen in rounds with Dr. Wynelle Link. Family in room at bedside. Patient is well, but has had some minor complaints of pain in the foot and toe, requiring pain medications. She states the pain has improved since last night.  Encouraged her to keep elevated due to the swelling. Foot is better today. No surgery indicated at this time.  Objective: Vital signs in last 24 hours: Temp:  [98.6 F (37 C)-99.3 F (37.4 C)] 99.3 F (37.4 C) (02/23 0521) Pulse Rate:  [70-77] 70 (02/23 0521) Resp:  [16-18] 18 (02/23 0521) BP: (146-183)/(62-77) 146/62 (02/23 0521) SpO2:  [97 %-100 %] 98 % (02/23 0521) Weight:  [83.5 kg (184 lb 1.4 oz)-84.4 kg (186 lb)] 83.5 kg (184 lb 1.4 oz) (02/22 1907)  Intake/Output from previous day:  Intake/Output Summary (Last 24 hours) at 05/16/16 0813 Last data filed at 05/16/16 0528  Gross per 24 hour  Intake              100 ml  Output                1 ml  Net               99 ml    Intake/Output this shift: No intake/output data recorded.  Labs:  Recent Labs  05/15/16 1556 05/16/16 0542  HGB 12.1 11.3*    Recent Labs  05/15/16 1556 05/16/16 0542  WBC 12.7* 11.6*  RBC 4.54 4.14  HCT 36.8 33.4*  PLT 254 232    Recent Labs  05/15/16 1556 05/16/16 0542  NA 139 139  K 3.6 3.7  CL 106 107  CO2 25 25  BUN 32* 30*  CREATININE 1.74* 1.61*  GLUCOSE 141* 138*  CALCIUM 8.9 8.5*    Recent Labs  05/15/16 1819 05/15/16 2010  INR 2.39 2.48    EXAM General - Patient is Alert, Appropriate and Oriented Extremity - Neurovascular intact Swelling improved since last night.  No active drainage at this time. Toe - still with some erythema but but less swelling  Motor Function - intact, moving foot and toes well on exam.   Past Medical History:  Diagnosis Date  . Diabetes mellitus without complication ([place])   . Fall at home Sept. 3, 2016   Fx  3 ribs  . Hypertension   .  Ileus, postoperative ([place]) 12/17/2012  . Irregular heart beat   . Renal disorder     Assessment/Plan: Hospital day - 1 Principal Problem:   Cellulitis of great toe of right foot Active Problems:   HTN (hypertension)   Dyslipidemia   Atrial fibrillation (HCC)   Chronic kidney disease, stage IV (severe) (HCC)   Hyperlipidemia   GERD (gastroesophageal reflux disease)   Diabetes mellitus with renal complications (HCC)   Chronic diastolic heart failure (HCC)  Estimated body mass index is 29.71 kg/m as calculated from the following:   Height as of this encounter: 5\' 6"  (1.676 m).   Weight as of this encounter: 83.5 kg (184 lb 1.4 oz).  Continue to encourage elevation of foot and toe up on two pillows when not up ambulating.  No indications for surgery at this time. Likely gouty toe with tophus type discharge.   Stable and slightly improved WBC (down to 11.6) Continue current treatment. Will follow up this weekend to ensure continued improvement.  Arlee Muslim, PA-C Orthopaedic Surgery 05/16/2016, 8:13 AM

## 2016-05-16 NOTE — Progress Notes (Signed)
[place] for warfarin Indication: Afib  Allergies  Allergen Reactions  . Lotensin [Benazepril Hcl] Anaphylaxis  . Zantac [Ranitidine Hcl] Anaphylaxis  . Tape Itching and Rash    Patient Measurements: Height: 5\' 6"  (167.6 cm) Weight: 184 lb 1.4 oz (83.5 kg) IBW/kg (Calculated) : 59.3  Vital Signs: Temp: 99.3 F (37.4 C) (02/23 0521) Temp Source: Oral (02/23 0521) BP: 146/62 (02/23 0521) Pulse Rate: 70 (02/23 0521)  Labs:  Recent Labs  05/15/16 1556 05/15/16 1819 05/15/16 2010 05/16/16 0542 05/16/16 1035  HGB 12.1  --   --  11.3*  --   HCT 36.8  --   --  33.4*  --   PLT 254  --   --  232  --   LABPROT  --  26.5* 27.3*  --  27.8*  INR  --  2.39 2.48  --  2.54  CREATININE 1.74*  --   --  1.61*  --     Estimated Creatinine Clearance: 30.4 mL/min (by C-G formula based on SCr of 1.61 mg/dL (H)).   Medical History: Past Medical History:  Diagnosis Date  . Diabetes mellitus without complication ([place])   . Fall at home Sept. 3, 2016   Fx  3 ribs  . Hypertension   . Ileus, postoperative ([place]) 12/17/2012  . Irregular heart beat   . Renal disorder     Medications:  Prescriptions Prior to Admission  Medication Sig Dispense Refill Last Dose  . acidophilus (RISAQUAD) CAPS capsule Take 1 capsule by mouth every evening.    05/14/2016 at Unknown time  . amiodarone (PACERONE) 200 MG tablet Take 0.5 tablets (100 mg total) by mouth daily. 90 tablet 2 05/15/2016 at Unknown time  . amLODipine (NORVASC) 10 MG tablet Take 5 mg by mouth daily at 12 noon.   1 05/15/2016 at Unknown time  . atorvastatin (LIPITOR) 40 MG tablet Take 40 mg by mouth every evening.    05/14/2016 at Unknown time  . colchicine 0.6 MG tablet Take 0.6 mg by mouth 2 (two) times daily.   05/15/2016 at Unknown time  . Cranberry 250 MG CAPS Take 750 mg by mouth daily.    05/15/2016 at Unknown time  . econazole nitrate 1 % cream Apply 1 application topically daily as needed (to  groin rash).    Past Week at Unknown time  . esomeprazole (NEXIUM) 20 MG capsule Take 20 mg by mouth daily before breakfast.    05/15/2016 at Unknown time  . ferrous sulfate 325 (65 FE) MG tablet Take 325 mg by mouth daily with breakfast.   05/15/2016 at Unknown time  . fluticasone (FLONASE) 50 MCG/ACT nasal spray Place 2 sprays into both nostrils daily as needed for rhinitis.    05/14/2016 at Unknown time  . furosemide (LASIX) 80 MG tablet Take 40-80 mg by mouth 2 (two) times daily. Pt takes one tablet in the morning and one-half tablet in the evening.   05/15/2016 at Unknown time  . HUMALOG KWIKPEN 100 UNIT/ML KiwkPen INJECT 7 UNITS SUBCUTANEOUSLY IN THE MORNING AND 8 UNITS IN THE EVENING (Patient taking differently: INJECT 7 UNITS SUBCUTANEOUSLY IN THE MORNING AND 6 UNITS IN THE EVENING) 15 mL 2 05/15/2016 at Unknown time  . metolazone (ZAROXOLYN) 5 MG tablet Take 5 mg by mouth daily as needed (for edema).   3 Past Month at Unknown time  . metoprolol (LOPRESSOR) 50 MG tablet Take 50 mg by mouth 2 (two) times daily.  05/15/2016 at 0800  . Multiple Vitamin (MULTIVITAMIN WITH MINERALS) TABS tablet Take 1 tablet by mouth daily.   05/15/2016 at Unknown time  . pantoprazole (PROTONIX) 40 MG tablet Take 40 mg by mouth every evening.   5 05/14/2016 at Unknown time  . polyethylene glycol (MIRALAX / GLYCOLAX) packet Take 17 g by mouth daily as needed for mild constipation.   Past Month at Unknown time  . potassium chloride (KLOR-CON 10) 10 MEQ tablet Take 3 tablets in the am and 3 tablets in the pm (Patient taking differently: Take 30 mEq by mouth 2 (two) times daily. ) 180 tablet 3 05/15/2016 at Unknown time  . VICTOZA 18 MG/3ML SOPN INJECT 1.2MG  SUBCUTANEOUSLY DAILY (Patient taking differently: INJECT 1.2MG  SUBCUTANEOUSLY AT BEDTIME) 6 pen 2 05/14/2016 at Unknown time  . warfarin (COUMADIN) 5 MG tablet Take 2.5-5 mg by mouth every evening. Pt takes one-half tablet on Monday and one tablet all other days.   05/14/2016  at 1800   Scheduled:  . acidophilus  1 capsule Oral QPM  . amiodarone  100 mg Oral Daily  . amLODipine  5 mg Oral Q1200  . atorvastatin  40 mg Oral QPM  . colchicine  0.6 mg Oral BID  . ferrous sulfate  325 mg Oral Q breakfast  . furosemide  40 mg Oral q1800  . furosemide  80 mg Oral Q breakfast  . insulin aspart  0-5 Units Subcutaneous QHS  . insulin aspart  0-9 Units Subcutaneous TID WC  . metoprolol  50 mg Oral BID  . multivitamin with minerals  1 tablet Oral Daily  . pantoprazole  40 mg Oral QPM  . piperacillin-tazobactam (ZOSYN)  IV  3.375 g Intravenous Q8H  . potassium chloride  30 mEq Oral BID  . sodium chloride flush  3 mL Intravenous Q12H  . vancomycin  1,000 mg Intravenous Q24H  . Warfarin - Pharmacist Dosing Inpatient   Does not apply q1800   Assessment: 89 yoF known to pharmacy from antibiotic dosing. PMH DMT2, HTN, HLD, gout, AFib on warfarin, dCHF, CKD4. Admitted for cellulitis/?osteo of toe; pharmacy to dose warfarin while admitted.   Baseline INR therapeutic  Prior anticoagulation: warfarin 5 mg daily except 2.5 mg on Mondays; last dose 2/21.  Significant events:  Today, 05/16/2016:  CBC: stable  INR therapeutic, 5mg  warfarin given yesterday 2/22  Major drug interactions: none, on broad-spectrum abx  No bleeding issues per nursing  DM diet ordered  Goal of Therapy: INR 2-3  Plan: 1) Repeat 5mg  warfarin tonight as per home regimen 2) Daily INR   Adrian Saran, PharmD, BCPS Pager [phone] 05/16/2016 11:32 AM

## 2016-05-16 NOTE — Consult Note (Addendum)
[place] Nurse wound consult note Reason for Consult:Right great toe.  Dr. Peri Maris consult and note is appreciated. Seen today for provision of conservative topical wound care orders for Nursing. Wound type:Infectious vs gouty ulceration Pressure Injury POA: No Measurement: entire RGT is involved, there is a msall (<0.2cm round x 0.1cm) open area near the nail bed. Left thumb has early signs of a similar eruption underway with a pinpoint lesion that is closed, but with yellow fluid beneath the surface. Wound YSH:UOHF, moist Drainage (amount, consistency, odor) scant amount of serosanguinous exudate Patient reports it is much improved over presentation yesterday Periwound:edeamtous, erythematous Dressing procedure/placement/frequency: I have provided Nursing with twice daily orders to cleanse and dress the toe and left thumb with an topical antimicrobial dressing (xeroform). [place] nursing team will not follow, but will remain available to this patient, the nursing and medical teams.  Please re-consult if needed. Thanks, Maudie Flakes, MSN, RN, [facility], Arther Abbott  Pager# [phone]

## 2016-05-16 NOTE — Progress Notes (Signed)
PROGRESS NOTE  Melanie FORDHAM  JOA:[id number] DOB: Jan 13, 1934  DOA: 05/15/2016 PCP: Aretta Nip, MD   Brief Narrative:  83 y.o. female , Married, lives with spouse, ambulates with the help of a walker, PMH of type II DM with renal complications/IDDM, HTN, HLD, gout, A. fib on Coumadin, chronic diastolic CHF, stage IV chronic kidney disease, presented to ED on 05/15/16 due to worsening pain, swelling, redness and drainage from right great toe. She gives history of eruptions from tophaceous deposits in the past in her fingers and elbow. Admitted for cellulitis complicating tophaceous gout. Orthopedics consulting.   Assessment & Plan:   Principal Problem:   Cellulitis of great toe of right foot Active Problems:   HTN (hypertension)   Dyslipidemia   Atrial fibrillation (HCC)   Chronic kidney disease, stage IV (severe) (HCC)   Hyperlipidemia   GERD (gastroesophageal reflux disease)   Diabetes mellitus with renal complications (HCC)   Chronic diastolic heart failure ([place])    1. Cellulitis of the right great toe complicating tophaceous gout: Patient was initially felt to have cellulitis, possible abscess and osteomyelitis. However given her prior history of eruptions from tophaceous gout (currently has one other such eruption on her right thumb) and clinical picture, it is felt that she has cellulitis complicating underlying tophaceous gout. Orthopedics was consulted and recommended nonoperative management with foot elevation and IV antibiotics. Continue empiric IV vancomycin and Zosyn. Improving. . 2. Uncontrolled type II DM with renal complications: Held Victoza. Placed on NovoLog SSI. Monitor closely and depending on CBGs may consider Lantus. Reasonable inpatient control. 3. Essential hypertension: Mildly uncontrolled. Continue home medications and monitor closely. 4. Chronic diastolic CHF: She seems to have chronic lower extremity edema but otherwise appears compensated. Continue Lasix  and when necessary metolazone. 5. Stage IV chronic kidney disease: Creatinine appears to be at baseline. Follow BMP closely. 6. Gout:  management as per problem #1. 7. Hyperlipidemia: Continue statins. 8. Paroxysmal A. fib: Controlled ventricular rate. Continue amiodarone and Coumadin. INR therapeutic 9. GERD: PPI. 10. Anemia: Follow CBCs   DVT prophylaxis: Anticoagulated on Coumadin  Code Status: Full Family Communication: Discussed with patient's spouse at bedside. Updated care and answered questions. Disposition Plan: DC home when medically improved, possibly in 3-4 days.   Consultants:   Orthopedics  Procedures:   None  Antimicrobials:   IV vancomycin 2/22 >  IV Zosyn 2/22 >    Subjective: Feels better. Decreased pain, redness and swelling of right great toe and drainage has stopped.  Objective:  Vitals:   05/15/16 1818 05/15/16 1907 05/15/16 2049 05/16/16 0521  BP: 173/72 (!) 168/65 (!) 166/62 (!) 146/62  Pulse: 75 77 74 70  Resp: 16 18 18 18   Temp:  98.6 F (37 C) 98.8 F (37.1 C) 99.3 F (37.4 C)  TempSrc:  Oral Oral Oral  SpO2: 97% 100% 100% 98%  Weight:  83.5 kg (184 lb 1.4 oz)    Height:  5\' 6"  (1.676 m)      Intake/Output Summary (Last 24 hours) at 05/16/16 0828 Last data filed at 05/16/16 0528  Gross per 24 hour  Intake              100 ml  Output                1 ml  Net               99 ml   Filed Weights   05/15/16 1552 05/15/16 1907  Weight: 84.4 kg (186 lb) 83.5 kg (184 lb 1.4 oz)    Examination:  General exam: Pleasant elderly female lying comfortably propped up in bed. Respiratory system: Clear to auscultation. Respiratory effort normal. Cardiovascular system: S1 & S2 heard, RRR. No JVD, murmurs, rubs, gallops or clicks. No pedal edema. Gastrointestinal system: Abdomen is nondistended, soft and nontender. No organomegaly or masses felt. Normal bowel sounds heard. Central nervous system: Alert and oriented. No focal neurological  deficits. Extremities: Symmetric 5 x 5 power. Skin: Left lower extremity below mid leg shows chronic edema with hyperpigmentation of skin and thickening of skin without open wounds and no acute findings. On admission, right great toe swollen, erythematous, darkish discoloration at places, pinhole-sized wound over the medial aspect which is extruding whitish/purulent material, surrounding fluctuance and tenderness. Some of the erythema, warmth and tenderness extends to the medial aspect of the dorsal forefoot. No crepitus. Please see pictures below for further details. All findings from admission have improved with no further drainage, decreased swelling, erythema, warmth. Still tender. No further drainage. Also has subcutaneous tophaceous gout on distal phalanx of right thumb. Psychiatry: Judgement and insight appear normal. Mood & affect appropriate.     Data Reviewed: I have personally reviewed following labs and imaging studies  CBC:  Recent Labs Lab 05/15/16 1556 05/16/16 0542  WBC 12.7* 11.6*  NEUTROABS 9.3*  --   HGB 12.1 11.3*  HCT 36.8 33.4*  MCV 81.1 80.7  PLT 254 161   Basic Metabolic Panel:  Recent Labs Lab 05/15/16 1556 05/16/16 0542  NA 139 139  K 3.6 3.7  CL 106 107  CO2 25 25  GLUCOSE 141* 138*  BUN 32* 30*  CREATININE 1.74* 1.61*  CALCIUM 8.9 8.5*   GFR: Estimated Creatinine Clearance: 30.4 mL/min (by C-G formula based on SCr of 1.61 mg/dL (H)). Liver Function Tests:  Recent Labs Lab 05/15/16 1556  AST 19  ALT 18  ALKPHOS 51  BILITOT 0.5  PROT 7.0  ALBUMIN 3.1*   No results for input(s): LIPASE, AMYLASE in the last 168 hours. No results for input(s): AMMONIA in the last 168 hours. Coagulation Profile:  Recent Labs Lab 05/15/16 1819 05/15/16 2010  INR 2.39 2.48   Cardiac Enzymes: No results for input(s): CKTOTAL, CKMB, CKMBINDEX, TROPONINI in the last 168 hours. BNP (last 3 results) No results for input(s): PROBNP in the last 8760  hours. HbA1C: No results for input(s): HGBA1C in the last 72 hours. CBG:  Recent Labs Lab 05/15/16 2238 05/16/16 0719  GLUCAP 220* 143*   Lipid Profile: No results for input(s): CHOL, HDL, LDLCALC, TRIG, CHOLHDL, LDLDIRECT in the last 72 hours. Thyroid Function Tests: No results for input(s): TSH, T4TOTAL, FREET4, T3FREE, THYROIDAB in the last 72 hours. Anemia Panel: No results for input(s): VITAMINB12, FOLATE, FERRITIN, TIBC, IRON, RETICCTPCT in the last 72 hours.  Sepsis Labs:  Recent Labs Lab 05/15/16 1556 05/15/16 1610 05/16/16 0542  WBC 12.7*  --  11.6*  LATICACIDVEN  --  1.12  --     No results found for this or any previous visit (from the past 240 hour(s)).       Radiology Studies: Dg Foot Complete Right  Result Date: 05/15/2016 CLINICAL DATA:  RIGHT great toe discoloration and oozing is seen. Concern for osteomyelitis. EXAM: RIGHT FOOT COMPLETE - 3+ VIEW COMPARISON:  None. FINDINGS: There is diffuse soft tissue swelling. Osteopenia is noted. There is vascular calcification consistent with diabetes mellitus. There is no fracture or dislocation. There  is minimal cortical irregularity of the dorsal aspect of the distal phalanx of the great toe, seen best on the oblique radiograph, which could represent early changes of osteomyelitis. IMPRESSION: Minimal cortical irregularity of the dorsal aspect of the distal phalanx great toe, which could represent early changes of osteomyelitis.If further investigation desired, and no contraindications, consider MRI. Electronically Signed   By: Staci Righter M.D.   On: 05/15/2016 16:53        Scheduled Meds: . acidophilus  1 capsule Oral QPM  . amiodarone  100 mg Oral Daily  . amLODipine  5 mg Oral Q1200  . atorvastatin  40 mg Oral QPM  . colchicine  0.6 mg Oral BID  . ferrous sulfate  325 mg Oral Q breakfast  . furosemide  40 mg Oral q1800  . furosemide  80 mg Oral Q breakfast  . insulin aspart  0-5 Units Subcutaneous  QHS  . insulin aspart  0-9 Units Subcutaneous TID WC  . metoprolol  50 mg Oral BID  . multivitamin with minerals  1 tablet Oral Daily  . pantoprazole  40 mg Oral QPM  . piperacillin-tazobactam (ZOSYN)  IV  3.375 g Intravenous Q8H  . potassium chloride  30 mEq Oral BID  . sodium chloride flush  3 mL Intravenous Q12H  . vancomycin  1,000 mg Intravenous Q24H  . Warfarin - Pharmacist Dosing Inpatient   Does not apply q1800   Continuous Infusions:   LOS: 1 day      [facility], MD [facility] Pager [phone]  If 7PM-7AM, please contact night-coverage [email or web address] Password [facility] 05/16/2016, 8:28 AM

## 2016-05-17 LAB — BASIC METABOLIC PANEL
ANION GAP: 6 (ref 5–15)
BUN: 25 mg/dL — AB (ref 6–20)
CHLORIDE: 107 mmol/L (ref 101–111)
CO2: 27 mmol/L (ref 22–32)
Calcium: 8.9 mg/dL (ref 8.9–10.3)
Creatinine, Ser: 1.72 mg/dL — ABNORMAL HIGH (ref 0.44–1.00)
GFR calc Af Amer: 31 mL/min — ABNORMAL LOW (ref >60)
GFR calc non Af Amer: 27 mL/min — ABNORMAL LOW (ref >60)
GLUCOSE: 141 mg/dL — AB (ref 65–99)
POTASSIUM: 3.4 mmol/L — AB (ref 3.5–5.1)
Sodium: 140 mmol/L (ref 135–145)

## 2016-05-17 LAB — CBC
HEMATOCRIT: 34.8 % — AB (ref 36.0–46.0)
HEMOGLOBIN: 11.3 g/dL — AB (ref 12.0–15.0)
MCH: 26.2 pg (ref 26.0–34.0)
MCHC: 32.5 g/dL (ref 30.0–36.0)
MCV: 80.7 fL (ref 78.0–100.0)
Platelets: 225 10*3/uL (ref 150–400)
RBC: 4.31 MIL/uL (ref 3.87–5.11)
RDW: 13.6 % (ref 11.5–15.5)
WBC: 11 10*3/uL — ABNORMAL HIGH (ref 4.0–10.5)

## 2016-05-17 LAB — GLUCOSE, CAPILLARY
GLUCOSE-CAPILLARY: 140 mg/dL — AB (ref 65–99)
GLUCOSE-CAPILLARY: 176 mg/dL — AB (ref 65–99)
GLUCOSE-CAPILLARY: 143 mg/dL — AB (ref 65–99)
Glucose-Capillary: 186 mg/dL — ABNORMAL HIGH (ref 65–99)

## 2016-05-17 LAB — PROTIME-INR
INR: 2.69
Prothrombin Time: 29.1 seconds — ABNORMAL HIGH (ref 11.4–15.2)

## 2016-05-17 MED ORDER — WARFARIN SODIUM 5 MG PO TABS
5.0000 mg | ORAL_TABLET | Freq: Once | ORAL | Status: AC
  Administered 2016-05-17: 5 mg via ORAL
  Filled 2016-05-17: qty 1

## 2016-05-17 NOTE — Progress Notes (Signed)
[place] for warfarin Indication: Afib  Allergies  Allergen Reactions  . Lotensin [Benazepril Hcl] Anaphylaxis  . Zantac [Ranitidine Hcl] Anaphylaxis  . Tape Itching and Rash   Patient Measurements: Height: 5\' 6"  (167.6 cm) Weight: 184 lb 1.4 oz (83.5 kg) IBW/kg (Calculated) : 59.3  Vital Signs: Temp: 99 F (37.2 C) (02/24 0605) Temp Source: Oral (02/24 0605) BP: 168/56 (02/24 0605) Pulse Rate: 63 (02/24 0605)  Labs:  Recent Labs  05/15/16 1556  05/15/16 2010 05/16/16 0542 05/16/16 1035 05/17/16 0543  HGB 12.1  --   --  11.3*  --  11.3*  HCT 36.8  --   --  33.4*  --  34.8*  PLT 254  --   --  232  --  225  LABPROT  --   < > 27.3*  --  27.8* 29.1*  INR  --   < > 2.48  --  2.54 2.69  CREATININE 1.74*  --   --  1.61*  --  1.72*  < > = values in this interval not displayed.  Estimated Creatinine Clearance: 28.4 mL/min (by C-G formula based on SCr of 1.72 mg/dL (H)).  Medical History: Past Medical History:  Diagnosis Date  . Diabetes mellitus without complication ([place])   . Fall at home Sept. 3, 2016   Fx  3 ribs  . Hypertension   . Ileus, postoperative ([place]) 12/17/2012  . Irregular heart beat   . Renal disorder    Medications:  Prescriptions Prior to Admission  Medication Sig Dispense Refill Last Dose  . acidophilus (RISAQUAD) CAPS capsule Take 1 capsule by mouth every evening.    05/14/2016 at Unknown time  . amiodarone (PACERONE) 200 MG tablet Take 0.5 tablets (100 mg total) by mouth daily. 90 tablet 2 05/15/2016 at Unknown time  . amLODipine (NORVASC) 10 MG tablet Take 5 mg by mouth daily at 12 noon.   1 05/15/2016 at Unknown time  . atorvastatin (LIPITOR) 40 MG tablet Take 40 mg by mouth every evening.    05/14/2016 at Unknown time  . colchicine 0.6 MG tablet Take 0.6 mg by mouth 2 (two) times daily.   05/15/2016 at Unknown time  . Cranberry 250 MG CAPS Take 750 mg by mouth daily.    05/15/2016 at Unknown time  . econazole  nitrate 1 % cream Apply 1 application topically daily as needed (to groin rash).    Past Week at Unknown time  . esomeprazole (NEXIUM) 20 MG capsule Take 20 mg by mouth daily before breakfast.    05/15/2016 at Unknown time  . ferrous sulfate 325 (65 FE) MG tablet Take 325 mg by mouth daily with breakfast.   05/15/2016 at Unknown time  . fluticasone (FLONASE) 50 MCG/ACT nasal spray Place 2 sprays into both nostrils daily as needed for rhinitis.    05/14/2016 at Unknown time  . furosemide (LASIX) 80 MG tablet Take 40-80 mg by mouth 2 (two) times daily. Pt takes one tablet in the morning and one-half tablet in the evening.   05/15/2016 at Unknown time  . HUMALOG KWIKPEN 100 UNIT/ML KiwkPen INJECT 7 UNITS SUBCUTANEOUSLY IN THE MORNING AND 8 UNITS IN THE EVENING (Patient taking differently: INJECT 7 UNITS SUBCUTANEOUSLY IN THE MORNING AND 6 UNITS IN THE EVENING) 15 mL 2 05/15/2016 at Unknown time  . metolazone (ZAROXOLYN) 5 MG tablet Take 5 mg by mouth daily as needed (for edema).   3 Past Month at Unknown time  .  metoprolol (LOPRESSOR) 50 MG tablet Take 50 mg by mouth 2 (two) times daily.   05/15/2016 at 0800  . Multiple Vitamin (MULTIVITAMIN WITH MINERALS) TABS tablet Take 1 tablet by mouth daily.   05/15/2016 at Unknown time  . pantoprazole (PROTONIX) 40 MG tablet Take 40 mg by mouth every evening.   5 05/14/2016 at Unknown time  . polyethylene glycol (MIRALAX / GLYCOLAX) packet Take 17 g by mouth daily as needed for mild constipation.   Past Month at Unknown time  . potassium chloride (KLOR-CON 10) 10 MEQ tablet Take 3 tablets in the am and 3 tablets in the pm (Patient taking differently: Take 30 mEq by mouth 2 (two) times daily. ) 180 tablet 3 05/15/2016 at Unknown time  . VICTOZA 18 MG/3ML SOPN INJECT 1.2MG  SUBCUTANEOUSLY DAILY (Patient taking differently: INJECT 1.2MG  SUBCUTANEOUSLY AT BEDTIME) 6 pen 2 05/14/2016 at Unknown time  . warfarin (COUMADIN) 5 MG tablet Take 2.5-5 mg by mouth every evening. Pt takes  one-half tablet on Monday and one tablet all other days.   05/14/2016 at 1800   Scheduled:  . acidophilus  1 capsule Oral QPM  . amiodarone  100 mg Oral Daily  . amLODipine  5 mg Oral Q1200  . atorvastatin  40 mg Oral QPM  . colchicine  0.6 mg Oral BID  . ferrous sulfate  325 mg Oral Q breakfast  . furosemide  40 mg Oral q1800  . furosemide  80 mg Oral Q breakfast  . insulin aspart  0-5 Units Subcutaneous QHS  . insulin aspart  0-9 Units Subcutaneous TID WC  . metoprolol  50 mg Oral BID  . multivitamin with minerals  1 tablet Oral Daily  . pantoprazole  40 mg Oral QPM  . piperacillin-tazobactam (ZOSYN)  IV  3.375 g Intravenous Q8H  . potassium chloride  30 mEq Oral BID  . sodium chloride flush  3 mL Intravenous Q12H  . vancomycin  1,000 mg Intravenous Q24H  . Warfarin - Pharmacist Dosing Inpatient   Does not apply q1800   Assessment: 66 yoF known to pharmacy from antibiotic dosing. PMH DMT2, HTN, HLD, gout, AFib on warfarin, dCHF, CKD4. Admitted for cellulitis/?osteo/gout flare of R great toe; pharmacy to dose warfarin while admitted.   Baseline INR therapeutic  Prior anticoagulation: warfarin 5 mg daily except 2.5 mg on Mondays; last dose 2/21.  Significant events:  Today, 05/17/2016:  CBC: stable  INR 2.69, therapeutic on home regimen  Major drug interactions: none, broad-spectrum abx could increase INR  No bleeding issues per nursing  regular diet ordered  Goal of Therapy: INR 2-3  Plan: 1) Repeat 5mg  warfarin today at 1800, as per home regimen 2) Daily INR  Minda Ditto PharmD Pager [phone] 05/17/2016, 9:41 AM

## 2016-05-17 NOTE — Progress Notes (Signed)
Subjective: Patient feels like her foot is continuing to improve. Has pain only when she hangs her foot in a dependent position for a few minutes.   Objective: Vital signs in last 24 hours: Temp:  [98.1 F (36.7 C)-99 F (37.2 C)] 99 F (37.2 C) (02/24 0605) Pulse Rate:  [57-63] 63 (02/24 0605) Resp:  [18] 18 (02/24 0605) BP: (141-168)/(53-62) 168/56 (02/24 0605) SpO2:  [98 %-100 %] 98 % (02/24 0605)  Intake/Output from previous day: 02/23 0701 - 02/24 0700 In: 950 [P.O.:600; IV Piggyback:350] Out: 6 [Urine:3; Stool:3] Intake/Output this shift: No intake/output data recorded.   Recent Labs  05/15/16 1556 05/16/16 0542 05/17/16 0543  HGB 12.1 11.3* 11.3*    Recent Labs  05/16/16 0542 05/17/16 0543  WBC 11.6* 11.0*  RBC 4.14 4.31  HCT 33.4* 34.8*  PLT 232 225    Recent Labs  05/16/16 0542 05/17/16 0543  NA 139 140  K 3.7 3.4*  CL 107 107  CO2 25 27  BUN 30* 25*  CREATININE 1.61* 1.72*  GLUCOSE 138* 141*  CALCIUM 8.5* 8.9    Recent Labs  05/16/16 1035 05/17/16 0543  INR 2.54 2.69    right great toe swelling greatly improved. no drainage or erythema from sight of previous gouty eruption  Assessment/Plan: Gouty eruption right great toe- continues to improve. No indication for surgical intervention. Should not need antibiotics post discharge. Will sign off at this time. Call if any other questions or concerns.   [name] 05/17/2016, 7:27 AM

## 2016-05-17 NOTE — Evaluation (Signed)
Physical Therapy Evaluation Patient Details Name: [name] MRN: [id number] DOB: 05/03/1933 Today's Date: 05/17/2016   History of Present Illness  Pt is an 83 year old female with PMH of type II DM with renal complications/IDDM, HTN, HLD, gout, A. fib on Coumadin, chronic diastolic CHF, stage IV chronic kidney disease, presented to ED on 05/15/16 due to worsening pain, swelling, redness and drainage from right great toe. History of eruptions from tophaceous deposits in the past in her fingers and elbow. Admitted for cellulitis complicating tophaceous gout  Clinical Impression  Patient evaluated by Physical Therapy with no further acute PT needs identified. All education has been completed and the patient has no further questions.  Pt tolerated ambulation well however slow pace and a little painful.  Pt would benefit from HHPT for home safety evaluation upon d/c. Pt uses RW at baseline. PT is signing off. Thank you for this referral.     Follow Up Recommendations Home health PT    Equipment Recommendations  None recommended by PT    Recommendations for Other Services       Precautions / Restrictions Precautions Precautions: Fall      Mobility  Bed Mobility Overal bed mobility: Needs Assistance Bed Mobility: Supine to Sit     Supine to sit: Supervision;HOB elevated        Transfers Overall transfer level: Needs assistance Equipment used: Rolling walker (2 wheeled) Transfers: Sit to/from Stand Sit to Stand: Min guard;Supervision            Ambulation/Gait Ambulation/Gait assistance: Min guard;Supervision Ambulation Distance (Feet): 80 Feet Assistive device: Rolling walker (2 wheeled) Gait Pattern/deviations: Step-to pattern;Decreased stance time - left;Antalgic;Step-through pattern     General Gait Details: encouraged more WBing on RW when putting weight on R foot, also stated pt could perform heel weight bearing to reduce pain however she was able to tolerate  entire foot contact   Stairs Stairs:  (verbally discussed leading with nonpainful foot stepping up and down with painful foot first)          Wheelchair Mobility    Modified Rankin (Stroke Patients Only)       Balance                                             Pertinent Vitals/Pain Pain Assessment: 0-10 Pain Score: 4  Pain Location: R great toe with dependent position Pain Descriptors / Indicators: Throbbing Pain Intervention(s): Limited activity within patient's tolerance;Monitored during session;Repositioned    Home Living Family/patient expects to be discharged to:: Private residence Living Arrangements: Spouse/significant other   Type of Home: House Home Access: Stairs to enter Entrance Stairs-Rails: None Technical brewer of Steps: 1 Home Layout: One level Home Equipment: Environmental consultant - 2 wheels      Prior Function Level of Independence: Independent with assistive device(s)   Gait / Transfers Assistance Needed: uses RW           Hand Dominance        Extremity/Trunk Assessment        Lower Extremity Assessment Lower Extremity Assessment: RLE deficits/detail RLE Deficits / Details: grossly at least 3+/5 throughout based on functional observation, edematous R great toe which pt reports looks better then upon admission       Communication   Communication: No difficulties  Cognition Arousal/Alertness: Awake/alert Behavior During Therapy: [facility] for tasks assessed/performed  Overall Cognitive Status: Within Functional Limits for tasks assessed                      General Comments      Exercises     Assessment/Plan    PT Assessment All further PT needs can be met in the next venue of care  PT Problem List Decreased strength;Decreased activity tolerance;Pain;Decreased mobility       PT Treatment Interventions      PT Goals (Current goals can be found in the Care Plan section)  Acute Rehab PT Goals PT Goal  Formulation: All assessment and education complete, DC therapy    Frequency     Barriers to discharge        Co-evaluation               End of Session Equipment Utilized During Treatment: Gait belt Activity Tolerance: Patient tolerated treatment well Patient left: with call bell/phone within reach;Other (comment);with family/visitor present (BSC, RN aware) Nurse Communication: Mobility status PT Visit Diagnosis: Other abnormalities of gait and mobility (R26.89)         Time: 6389-3734 PT Time Calculation (min) (ACUTE ONLY): 10 min   Charges:   PT Evaluation $PT Eval Low Complexity: 1 Procedure     PT G Codes:         Ravon Mcilhenny,KATHrine E 05/17/2016, 1:01 PM Carmelia Bake, PT, DPT 05/17/2016 Pager: [phone]

## 2016-05-17 NOTE — Progress Notes (Signed)
PROGRESS NOTE  [name]  GUR:[id number] DOB: 06/21/1933  DOA: 05/15/2016 PCP: Aretta Nip, MD   Brief Narrative:  83 y.o. female , Married, lives with spouse, ambulates with the help of a walker, PMH of type II DM with renal complications/IDDM, HTN, HLD, gout, A. fib on Coumadin, chronic diastolic CHF, stage IV chronic kidney disease, presented to ED on 05/15/16 due to worsening pain, swelling, redness and drainage from right great toe. She gives history of eruptions from tophaceous deposits in the past in her fingers and elbow. Admitted for cellulitis complicating tophaceous gout. Orthopedics consulting.   Assessment & Plan:   Principal Problem:   Cellulitis of great toe of right foot Active Problems:   HTN (hypertension)   Dyslipidemia   Atrial fibrillation (HCC)   Chronic kidney disease, stage IV (severe) (HCC)   Hyperlipidemia   GERD (gastroesophageal reflux disease)   Diabetes mellitus with renal complications (HCC)   Chronic diastolic heart failure ([place])    1. Cellulitis of the right great toe complicating tophaceous gout: Patient was initially felt to have cellulitis, possible abscess and osteomyelitis. However given her prior history of eruptions from tophaceous gout (currently has one other such eruption on her right thumb) and clinical picture, it is felt that she has cellulitis complicating underlying tophaceous gout. Orthopedics was consulted and recommended nonoperative management with foot elevation and IV antibiotics. Continue empiric IV vancomycin and Zosyn. Improving. She has an area over the dorsum of her right great toe which is fluctuant and concerning for abscess. Discussed with orthopedics and requested that they reevaluate her on 2/25. 2. Uncontrolled type II DM with renal complications: Held Victoza. Placed on NovoLog SSI. Monitor closely and depending on CBGs may consider Lantus. Reasonable inpatient control. 3. Essential hypertension: Mildly  uncontrolled. Continue home medications and monitor closely. 4. Chronic diastolic CHF: She seems to have chronic lower extremity edema but otherwise appears compensated. Continue Lasix and when necessary metolazone. 5. Stage IV chronic kidney disease: Creatinine appears to be at baseline. Follow BMP closely. 6. Gout:  management as per problem #1. 7. Hyperlipidemia: Continue statins. 8. Paroxysmal A. fib: Controlled ventricular rate. Continue amiodarone and Coumadin. INR therapeutic 9. GERD: PPI. 10. Anemia: Stable. 11. Hypokalemia: Continue potassium replacement and follow BMP.   DVT prophylaxis: Anticoagulated on Coumadin  Code Status: Full Family Communication: Discussed with patient's spouse at bedside. Updated care and answered questions. Disposition Plan: DC home when medically improved, possibly in 2-3 days.   Consultants:   Orthopedics  Procedures:   None  Antimicrobials:   IV vancomycin 2/22 >  IV Zosyn 2/22 >    Subjective: Continues to feel better with improved pain, swelling and redness of right great toe.  Objective:  Vitals:   05/16/16 1350 05/16/16 2025 05/17/16 0605 05/17/16 1037  BP: (!) 167/62 (!) 141/53 (!) 168/56 (!) 165/56  Pulse: (!) 58 (!) 57 63 62  Resp: 18 18 18 18   Temp: 98.1 F (36.7 C) 98.5 F (36.9 C) 99 F (37.2 C) 98.4 F (36.9 C)  TempSrc: Oral Oral Oral Oral  SpO2: 100% 99% 98% 100%  Weight:      Height:        Intake/Output Summary (Last 24 hours) at 05/17/16 1242 Last data filed at 05/17/16 0845  Gross per 24 hour  Intake              950 ml  Output  2 ml  Net              948 ml   Filed Weights   05/15/16 1552 05/15/16 1907  Weight: 84.4 kg (186 lb) 83.5 kg (184 lb 1.4 oz)    Examination:  General exam: Pleasant elderly female lying comfortably propped up in bed. Respiratory system: Clear to auscultation. Respiratory effort normal. Cardiovascular system: S1 & S2 heard, RRR. No JVD, murmurs, rubs,  gallops or clicks. No pedal edema. Gastrointestinal system: Abdomen is nondistended, soft and nontender. No organomegaly or masses felt. Normal bowel sounds heard. Central nervous system: Alert and oriented. No focal neurological deficits. Extremities: Symmetric 5 x 5 power. Skin: Left lower extremity below mid leg shows chronic edema with hyperpigmentation of skin and thickening of skin without open wounds and no acute findings. On admission, right great toe swollen, erythematous, darkish discoloration at places, pinhole-sized wound over the medial aspect which is extruding whitish/purulent material, surrounding fluctuance and tenderness. Some of the erythema, warmth and tenderness extends to the medial aspect of the dorsal forefoot. No crepitus. Please see pictures below for further details. All findings from admission have improved with no further drainage, decreased swelling, erythema, warmth. Still tender. Dorsum of right great toe shows some new fluctuance and? Purulent drainage. Also has subcutaneous tophaceous gout on distal phalanx of right thumb. Psychiatry: Judgement and insight appear normal. Mood & affect appropriate.     Data Reviewed: I have personally reviewed following labs and imaging studies  CBC:  Recent Labs Lab 05/15/16 1556 05/16/16 0542 05/17/16 0543  WBC 12.7* 11.6* 11.0*  NEUTROABS 9.3*  --   --   HGB 12.1 11.3* 11.3*  HCT 36.8 33.4* 34.8*  MCV 81.1 80.7 80.7  PLT 254 232 809   Basic Metabolic Panel:  Recent Labs Lab 05/15/16 1556 05/16/16 0542 05/17/16 0543  NA 139 139 140  K 3.6 3.7 3.4*  CL 106 107 107  CO2 25 25 27   GLUCOSE 141* 138* 141*  BUN 32* 30* 25*  CREATININE 1.74* 1.61* 1.72*  CALCIUM 8.9 8.5* 8.9   GFR: Estimated Creatinine Clearance: 28.4 mL/min (by C-G formula based on SCr of 1.72 mg/dL (H)). Liver Function Tests:  Recent Labs Lab 05/15/16 1556  AST 19  ALT 18  ALKPHOS 51  BILITOT 0.5  PROT 7.0  ALBUMIN 3.1*   No results  for input(s): LIPASE, AMYLASE in the last 168 hours. No results for input(s): AMMONIA in the last 168 hours. Coagulation Profile:  Recent Labs Lab 05/15/16 1819 05/15/16 2010 05/16/16 1035 05/17/16 0543  INR 2.39 2.48 2.54 2.69   Cardiac Enzymes: No results for input(s): CKTOTAL, CKMB, CKMBINDEX, TROPONINI in the last 168 hours. BNP (last 3 results) No results for input(s): PROBNP in the last 8760 hours. HbA1C: No results for input(s): HGBA1C in the last 72 hours. CBG:  Recent Labs Lab 05/16/16 1148 05/16/16 1724 05/16/16 2138 05/17/16 0746 05/17/16 1143  GLUCAP 182* 161* 152* 140* 176*   Lipid Profile: No results for input(s): CHOL, HDL, LDLCALC, TRIG, CHOLHDL, LDLDIRECT in the last 72 hours. Thyroid Function Tests: No results for input(s): TSH, T4TOTAL, FREET4, T3FREE, THYROIDAB in the last 72 hours. Anemia Panel: No results for input(s): VITAMINB12, FOLATE, FERRITIN, TIBC, IRON, RETICCTPCT in the last 72 hours.  Sepsis Labs:  Recent Labs Lab 05/15/16 1556 05/15/16 1610 05/16/16 0542 05/17/16 0543  WBC 12.7*  --  11.6* 11.0*  LATICACIDVEN  --  1.12  --   --  No results found for this or any previous visit (from the past 240 hour(s)).       Radiology Studies: Dg Foot Complete Right  Result Date: 05/15/2016 CLINICAL DATA:  RIGHT great toe discoloration and oozing is seen. Concern for osteomyelitis. EXAM: RIGHT FOOT COMPLETE - 3+ VIEW COMPARISON:  None. FINDINGS: There is diffuse soft tissue swelling. Osteopenia is noted. There is vascular calcification consistent with diabetes mellitus. There is no fracture or dislocation. There is minimal cortical irregularity of the dorsal aspect of the distal phalanx of the great toe, seen best on the oblique radiograph, which could represent early changes of osteomyelitis. IMPRESSION: Minimal cortical irregularity of the dorsal aspect of the distal phalanx great toe, which could represent early changes of  osteomyelitis.If further investigation desired, and no contraindications, consider MRI. Electronically Signed   By: Staci Righter M.D.   On: 05/15/2016 16:53        Scheduled Meds: . acidophilus  1 capsule Oral QPM  . amiodarone  100 mg Oral Daily  . amLODipine  5 mg Oral Q1200  . atorvastatin  40 mg Oral QPM  . colchicine  0.6 mg Oral BID  . ferrous sulfate  325 mg Oral Q breakfast  . furosemide  40 mg Oral q1800  . furosemide  80 mg Oral Q breakfast  . insulin aspart  0-5 Units Subcutaneous QHS  . insulin aspart  0-9 Units Subcutaneous TID WC  . metoprolol  50 mg Oral BID  . multivitamin with minerals  1 tablet Oral Daily  . pantoprazole  40 mg Oral QPM  . piperacillin-tazobactam (ZOSYN)  IV  3.375 g Intravenous Q8H  . potassium chloride  30 mEq Oral BID  . sodium chloride flush  3 mL Intravenous Q12H  . vancomycin  1,000 mg Intravenous Q24H  . warfarin  5 mg Oral ONCE-1800  . Warfarin - Pharmacist Dosing Inpatient   Does not apply q1800   Continuous Infusions:   LOS: 2 days      [facility], MD [facility] Pager [phone]  If 7PM-7AM, please contact night-coverage [email or web address] Password [facility] 05/17/2016, 12:42 PM

## 2016-05-18 ENCOUNTER — Inpatient Hospital Stay (HOSPITAL_COMMUNITY): Payer: Medicare Other

## 2016-05-18 LAB — BASIC METABOLIC PANEL
Anion gap: 8 (ref 5–15)
BUN: 23 mg/dL — ABNORMAL HIGH (ref 6–20)
CALCIUM: 9.3 mg/dL (ref 8.9–10.3)
CO2: 29 mmol/L (ref 22–32)
CREATININE: 1.63 mg/dL — AB (ref 0.44–1.00)
Chloride: 102 mmol/L (ref 101–111)
GFR calc non Af Amer: 29 mL/min — ABNORMAL LOW (ref >60)
GFR, EST AFRICAN AMERICAN: 33 mL/min — AB (ref >60)
GLUCOSE: 156 mg/dL — AB (ref 65–99)
Potassium: 3.6 mmol/L (ref 3.5–5.1)
Sodium: 139 mmol/L (ref 135–145)

## 2016-05-18 LAB — GLUCOSE, CAPILLARY
GLUCOSE-CAPILLARY: 172 mg/dL — AB (ref 65–99)
GLUCOSE-CAPILLARY: 214 mg/dL — AB (ref 65–99)
Glucose-Capillary: 112 mg/dL — ABNORMAL HIGH (ref 65–99)
Glucose-Capillary: 224 mg/dL — ABNORMAL HIGH (ref 65–99)

## 2016-05-18 LAB — PROTIME-INR
INR: 2.62
Prothrombin Time: 28.5 seconds — ABNORMAL HIGH (ref 11.4–15.2)

## 2016-05-18 LAB — VANCOMYCIN, TROUGH: Vancomycin Tr: 18 ug/mL (ref 15–20)

## 2016-05-18 MED ORDER — WARFARIN SODIUM 5 MG PO TABS
5.0000 mg | ORAL_TABLET | Freq: Once | ORAL | Status: AC
  Administered 2016-05-18: 5 mg via ORAL
  Filled 2016-05-18: qty 1

## 2016-05-18 NOTE — Progress Notes (Signed)
Subjective:    Patient admitted 3 days ago with concerns of R hallux cellulitis vs gouty flare up with expression of tophi.  Patient has hx of chronic gouty flare ups with current flare up in L thumb.  Patient has been on empiric vanc and zosyn.  Patient denies hx of fever, chills, N/V.  Radiographs were concerning for potential osteomyelitis.  Reports that pain and symptoms have continued to improve.  Resting comfortably this morning.  Objective:   VITALS:  Temp:  [98.4 F (36.9 C)-99.2 F (37.3 C)] 99.2 F (37.3 C) (02/25 0436) Pulse Rate:  [60-62] 61 (02/25 0436) Resp:  [18-20] 18 (02/25 0436) BP: (165-177)/(56-61) 177/57 (02/25 0436) SpO2:  [99 %-100 %] 100 % (02/25 0436)  General: WDWN patient in NAD. Psych:  Appropriate mood and affect. Neuro:  A&O x 3, Moving all extremities, sensation intact to light touch HEENT:  EOMs intact Chest:  Even non-labored respirations Skin:  Dressing removed, evidence of milky drainage and superficial tophi at R medial hallux IP joint.  Extremities: Point tender over distal phalanx at IP joint.  moderate edema, mild erythema encompassing entire toe, no streaking up leg.  No lymphadenopathy. Pulses: Dorsalis pedis and post tibialis 2+ MSK:  ROM: EHL/FHL intact, MMT: patient is able to perform quad set, (-) Homan's    LABS  Recent Labs  05/15/16 1556 05/16/16 0542 05/17/16 0543  HGB 12.1 11.3* 11.3*  WBC 12.7* 11.6* 11.0*  PLT 254 232 225    Recent Labs  05/17/16 0543 05/18/16 0524  NA 140 139  K 3.4* 3.6  CL 107 102  CO2 27 29  BUN 25* 23*  CREATININE 1.72* 1.63*  GLUCOSE 141* 156*    Recent Labs  05/17/16 0543 05/18/16 0524  INR 2.69 2.62     Assessment/Plan:    With no history of fever, chills, N/V, and only mild elevation of white count this is likely complication of gouty flare up with expression of tophi.  Cortical irregularities on plain film may also be indicative of chronic gouty flare ups.  However, due to the  fact that the patient continues to experience persistent drainage I believe it's medically necessary to obtain an MRI of her R foot to r/o osteomyelitis.  Aspirate/culture would not be beneficial due to the fact that she has been on ABX for extended period of time.    MRI R foot ordered  If MRI is negative I would recommend D/C patient on oral doxy 100 mg BID for 5 days so that she has been on ABX for a week and have her f/u with her PCP.  With her hx of chronic gout flare ups she may eventually need an outpatient rheumatology consult to help manage/prevent future flare ups.  Patient would likely need [facility] nursing for wound management while this heals as she and her husband are likely unable to appropriately manage the wound.  WBAT R LE in flat post-op shoe.   Mechele Claude, PA-C, [employer] Office:  [phone]

## 2016-05-18 NOTE — Progress Notes (Signed)
[facility] for warfarin Indication: Afib  Allergies  Allergen Reactions  . Lotensin [Benazepril Hcl] Anaphylaxis  . Zantac [Ranitidine Hcl] Anaphylaxis  . Tape Itching and Rash   Patient Measurements: Height: 5\' 6"  (167.6 cm) Weight: 185 lb 3 oz (84 kg) IBW/kg (Calculated) : 59.3  Vital Signs: Temp: 99.2 F (37.3 C) (02/25 0436) Temp Source: Oral (02/25 0436) BP: 177/57 (02/25 0436) Pulse Rate: 61 (02/25 0436)  Labs:  Recent Labs  05/15/16 1556  05/16/16 0542 05/16/16 1035 05/17/16 0543 05/18/16 0524  HGB 12.1  --  11.3*  --  11.3*  --   HCT 36.8  --  33.4*  --  34.8*  --   PLT 254  --  232  --  225  --   LABPROT  --   < >  --  27.8* 29.1* 28.5*  INR  --   < >  --  2.54 2.69 2.62  CREATININE 1.74*  --  1.61*  --  1.72* 1.63*  < > = values in this interval not displayed.  Estimated Creatinine Clearance: 30.1 mL/min (by C-G formula based on SCr of 1.63 mg/dL (H)).  Medical History: Past Medical History:  Diagnosis Date  . Diabetes mellitus without complication ([place])   . Fall at home Sept. 3, 2016   Fx  3 ribs  . Hypertension   . Ileus, postoperative ([place]) 12/17/2012  . Irregular heart beat   . Renal disorder    Medications: this admission Scheduled:  . acidophilus  1 capsule Oral QPM  . amiodarone  100 mg Oral Daily  . amLODipine  5 mg Oral Q1200  . atorvastatin  40 mg Oral QPM  . colchicine  0.6 mg Oral BID  . ferrous sulfate  325 mg Oral Q breakfast  . furosemide  40 mg Oral q1800  . furosemide  80 mg Oral Q breakfast  . insulin aspart  0-5 Units Subcutaneous QHS  . insulin aspart  0-9 Units Subcutaneous TID WC  . metoprolol  50 mg Oral BID  . multivitamin with minerals  1 tablet Oral Daily  . pantoprazole  40 mg Oral QPM  . piperacillin-tazobactam (ZOSYN)  IV  3.375 g Intravenous Q8H  . potassium chloride  30 mEq Oral BID  . sodium chloride flush  3 mL Intravenous Q12H  . vancomycin  1,000 mg Intravenous Q24H   . Warfarin - Pharmacist Dosing Inpatient   Does not apply q1800   Assessment: 68 yoF known to pharmacy from antibiotic dosing. PMH DMT2, HTN, HLD, gout, AFib on warfarin, dCHF, CKD4. Admitted for cellulitis/?osteo/gout flare of R great toe; pharmacy to dose warfarin while admitted.   Baseline INR therapeutic  Prior anticoagulation: warfarin 5 mg daily except 2.5 mg on Mondays; last dose 2/21.  Significant events:  Today, 05/18/2016:  CBC: stable  INR 2.62, therapeutic on home regimen  Major drug interactions: none, broad-spectrum abx could increase INR  No bleeding issues per nursing  regular diet ordered  Goal of Therapy: INR 2-3  Plan: 1) Repeat 5mg  warfarin today at 1800, as per home regimen 2) Daily INR  Minda Ditto PharmD Pager [phone] 05/18/2016, 9:16 AM

## 2016-05-18 NOTE — Progress Notes (Addendum)
Addendum   Reviewed MRI right foot results. Concerning for large abscess around the right great toe + osteomyelitis.  Discussed with Orthopedic MD on call> recommended making her NPO after MN tonight for possible intervention in AM, ? I&D and advised no need to hold Coumadin for tonight.  Vernell Leep, MD, FACP, FHM. [facility] Pager [phone]  If 7PM-7AM, please contact night-coverage [email or web address] Password [email or web address] 05/18/2016, 2:18 PM

## 2016-05-18 NOTE — Progress Notes (Signed)
Pharmacy Antibiotic Note  [name] is a 83 y.o. female admitted on 05/15/2016 with cellulitis/abscess and possible osteomyelitis of R great toe. Purulent drainage noted. Pharmacy has been consulted for vancomycin and Zosyn dosing.  Plan:  Vancomycin 1500 mg IV now, then 1000 mg IV q24 hr; goal trough 15-20 mcg/mL with osteomyelitid  Vancomycin trough today in therapeutic range - continue same Vanc  Zosyn 3.375 g IV x1 30 min, then every 8 hrs by 4-hr infusion  SCr daily   Osteomyelitis per MRI: plan I&D in am 2/26  Height: 5\' 6"  (167.6 cm) Weight: 185 lb 3 oz (84 kg) IBW/kg (Calculated) : 59.3  Temp (24hrs), Avg:98.9 F (37.2 C), Min:98.4 F (36.9 C), Max:99.2 F (37.3 C)   Recent Labs Lab 05/15/16 1556 05/15/16 1610 05/16/16 0542 05/17/16 0543 05/18/16 0524 05/18/16 1701  WBC 12.7*  --  11.6* 11.0*  --   --   CREATININE 1.74*  --  1.61* 1.72* 1.63*  --   LATICACIDVEN  --  1.12  --   --   --   --   VANCOTROUGH  --   --   --   --   --  18    Estimated Creatinine Clearance: 30.1 mL/min (by C-G formula based on SCr of 1.63 mg/dL (H)).    Allergies  Allergen Reactions  . Lotensin [Benazepril Hcl] Anaphylaxis  . Zantac [Ranitidine Hcl] Anaphylaxis  . Tape Itching and Rash   Antimicrobials this admission: Vanc 2/22 >>  Zosyn 2/22 >>   Dose adjustments this admission: 1700 VT = 18 mcg/ml on 1gm q24, prior to 3rd dose  Microbiology results: No cultures  Thank you for allowing pharmacy to be a part of this patient's care.  Minda Ditto PharmD Pager [phone] 05/18/2016, 6:11 PM

## 2016-05-18 NOTE — Progress Notes (Signed)
PROGRESS NOTE  [name]  GGY:[id number] DOB: 1933/04/26  DOA: 05/15/2016 PCP: Aretta Nip, MD   Brief Narrative:  83 y.o. female , Married, lives with spouse, ambulates with the help of a walker, PMH of type II DM with renal complications/IDDM, HTN, HLD, gout, A. fib on Coumadin, chronic diastolic CHF, stage IV chronic kidney disease, presented to ED on 05/15/16 due to worsening pain, swelling, redness and drainage from right great toe. She gives history of eruptions from tophaceous deposits in the past in her fingers and elbow. Admitted for cellulitis complicating tophaceous gout. Orthopedics consulting.   Assessment & Plan:   Principal Problem:   Cellulitis of great toe of right foot Active Problems:   HTN (hypertension)   Dyslipidemia   Atrial fibrillation (HCC)   Chronic kidney disease, stage IV (severe) (HCC)   Hyperlipidemia   GERD (gastroesophageal reflux disease)   Diabetes mellitus with renal complications (HCC)   Chronic diastolic heart failure ([place])    1. Cellulitis of the right great toe complicating tophaceous gout: Patient was initially felt to have cellulitis, possible abscess and osteomyelitis. However given her prior history of eruptions from tophaceous gout (currently has one other such eruption on her right thumb) and clinical picture, it is felt that she has cellulitis complicating underlying tophaceous gout. Orthopedics was consulted and recommended nonoperative management with foot elevation and IV antibiotics. Continue empiric IV vancomycin and Zosyn. Improving. She had an area over the dorsum of her right great toe which seemed fluctuant and concerning for abscess, on 2/24. Orthopedic follow-up 2/25 appreciated: Suspect all due to gout flare and tophi expression but getting MRI to rule out osteomyelitis. If MRI negative, they recommend discharging on oral doxycycline 5 days to complete total one-week treatment, home health RN for wound management and WBAT  RLE in flat postop shoe. Follow MRI results. 2. Uncontrolled type II DM with renal complications: Held Victoza. Placed on NovoLog SSI. Monitor closely and depending on CBGs may consider Lantus. Reasonable inpatient control. 3. Essential hypertension: Mildly uncontrolled. Continue home medications and monitor closely. 4. Chronic diastolic CHF: She seems to have chronic lower extremity edema but otherwise appears compensated. Continue Lasix and when necessary metolazone. 5. Stage IV chronic kidney disease: Creatinine appears at baseline. 6. Gout:  management as per problem #1. Consider outpatient rheumatology consultation for management of tophaceous gout. 7. Hyperlipidemia: Continue statins. 8. Paroxysmal A. fib: Controlled ventricular rate. Continue amiodarone and Coumadin. INR therapeutic 9. GERD: PPI. 10. Anemia: Stable. 11. Hypokalemia: Replaced.   DVT prophylaxis: Anticoagulated on Coumadin  Code Status: Full Family Communication: Discussed with patient's spouse at bedside. Updated care and answered questions. Disposition Plan: DC home when medically improved, possibly 2/26 pending follow-up of MRI results.   Consultants:   Orthopedics  Procedures:   None  Antimicrobials:   IV vancomycin 2/22 >  IV Zosyn 2/22 >    Subjective: Continues to feel better with improved pain, swelling and redness of right great toe. No drainage for 2 days. Pain slightly worse this morning after manipulation by orthopedics.  Objective:  Vitals:   05/17/16 1336 05/17/16 2020 05/18/16 0436 05/18/16 0909  BP: (!) 168/59 (!) 166/61 (!) 177/57   Pulse: 60 60 61   Resp: 20 18 18    Temp: 98.5 F (36.9 C) 99.1 F (37.3 C) 99.2 F (37.3 C)   TempSrc: Oral Oral Oral   SpO2: 100% 99% 100%   Weight:    84 kg (185 lb 3 oz)  Height:  Intake/Output Summary (Last 24 hours) at 05/18/16 1300 Last data filed at 05/18/16 0811  Gross per 24 hour  Intake              240 ml  Output                 0 ml  Net              240 ml   Filed Weights   05/15/16 1552 05/15/16 1907 05/18/16 0909  Weight: 84.4 kg (186 lb) 83.5 kg (184 lb 1.4 oz) 84 kg (185 lb 3 oz)    Examination:  General exam: Pleasant elderly female lying comfortably propped up in bed. Respiratory system: Clear to auscultation. Respiratory effort normal. Cardiovascular system: S1 & S2 heard, RRR. No JVD, murmurs, rubs, gallops or clicks. No pedal edema. Gastrointestinal system: Abdomen is nondistended, soft and nontender. No organomegaly or masses felt. Normal bowel sounds heard. Central nervous system: Alert and oriented. No focal neurological deficits. Extremities: Symmetric 5 x 5 power. Skin: Left lower extremity below mid leg shows chronic edema with hyperpigmentation of skin and thickening of skin without open wounds and no acute findings. On admission, right great toe swollen, erythematous, darkish discoloration at places, pinhole-sized wound over the medial aspect which is extruding whitish/purulent material, surrounding fluctuance and tenderness. Some of the erythema, warmth and tenderness extends to the medial aspect of the dorsal forefoot. No crepitus. Please see pictures below for further details. All findings from admission have improved with no further drainage, decreased swelling, erythema, warmth. Still tender. Dorsum of right great toe showed some new fluctuance and? Purulent drainage on 2/24, but today just appears to be macerated skin. Also has subcutaneous tophaceous gout on distal phalanx of right thumb. Psychiatry: Judgement and insight appear normal. Mood & affect appropriate.     Data Reviewed: I have personally reviewed following labs and imaging studies  CBC:  Recent Labs Lab 05/15/16 1556 05/16/16 0542 05/17/16 0543  WBC 12.7* 11.6* 11.0*  NEUTROABS 9.3*  --   --   HGB 12.1 11.3* 11.3*  HCT 36.8 33.4* 34.8*  MCV 81.1 80.7 80.7  PLT 254 232 628   Basic Metabolic Panel:  Recent  Labs Lab 05/15/16 1556 05/16/16 0542 05/17/16 0543 05/18/16 0524  NA 139 139 140 139  K 3.6 3.7 3.4* 3.6  CL 106 107 107 102  CO2 25 25 27 29   GLUCOSE 141* 138* 141* 156*  BUN 32* 30* 25* 23*  CREATININE 1.74* 1.61* 1.72* 1.63*  CALCIUM 8.9 8.5* 8.9 9.3   GFR: Estimated Creatinine Clearance: 30.1 mL/min (by C-G formula based on SCr of 1.63 mg/dL (H)). Liver Function Tests:  Recent Labs Lab 05/15/16 1556  AST 19  ALT 18  ALKPHOS 51  BILITOT 0.5  PROT 7.0  ALBUMIN 3.1*   No results for input(s): LIPASE, AMYLASE in the last 168 hours. No results for input(s): AMMONIA in the last 168 hours. Coagulation Profile:  Recent Labs Lab 05/15/16 1819 05/15/16 2010 05/16/16 1035 05/17/16 0543 05/18/16 0524  INR 2.39 2.48 2.54 2.69 2.62   Cardiac Enzymes: No results for input(s): CKTOTAL, CKMB, CKMBINDEX, TROPONINI in the last 168 hours. BNP (last 3 results) No results for input(s): PROBNP in the last 8760 hours. HbA1C: No results for input(s): HGBA1C in the last 72 hours. CBG:  Recent Labs Lab 05/17/16 1143 05/17/16 1657 05/17/16 2152 05/18/16 0723 05/18/16 1137  GLUCAP 176* 143* 186* 172* 214*   Lipid Profile: No results  for input(s): CHOL, HDL, LDLCALC, TRIG, CHOLHDL, LDLDIRECT in the last 72 hours. Thyroid Function Tests: No results for input(s): TSH, T4TOTAL, FREET4, T3FREE, THYROIDAB in the last 72 hours. Anemia Panel: No results for input(s): VITAMINB12, FOLATE, FERRITIN, TIBC, IRON, RETICCTPCT in the last 72 hours.  Sepsis Labs:  Recent Labs Lab 05/15/16 1556 05/15/16 1610 05/16/16 0542 05/17/16 0543  WBC 12.7*  --  11.6* 11.0*  LATICACIDVEN  --  1.12  --   --     No results found for this or any previous visit (from the past 240 hour(s)).       Radiology Studies: No results found.      Scheduled Meds: . acidophilus  1 capsule Oral QPM  . amiodarone  100 mg Oral Daily  . amLODipine  5 mg Oral Q1200  . atorvastatin  40 mg Oral QPM   . colchicine  0.6 mg Oral BID  . ferrous sulfate  325 mg Oral Q breakfast  . furosemide  40 mg Oral q1800  . furosemide  80 mg Oral Q breakfast  . insulin aspart  0-5 Units Subcutaneous QHS  . insulin aspart  0-9 Units Subcutaneous TID WC  . metoprolol  50 mg Oral BID  . multivitamin with minerals  1 tablet Oral Daily  . pantoprazole  40 mg Oral QPM  . piperacillin-tazobactam (ZOSYN)  IV  3.375 g Intravenous Q8H  . potassium chloride  30 mEq Oral BID  . sodium chloride flush  3 mL Intravenous Q12H  . vancomycin  1,000 mg Intravenous Q24H  . warfarin  5 mg Oral ONCE-1800  . Warfarin - Pharmacist Dosing Inpatient   Does not apply q1800   Continuous Infusions:   LOS: 3 days      [facility], MD [facility] Pager [phone]  If 7PM-7AM, please contact night-coverage [email or web address] Password [email or web address] 05/18/2016, 1:00 PM

## 2016-05-19 ENCOUNTER — Inpatient Hospital Stay (HOSPITAL_COMMUNITY): Payer: Medicare Other | Admitting: Registered Nurse

## 2016-05-19 ENCOUNTER — Encounter (HOSPITAL_COMMUNITY)
Admission: EM | Disposition: A | Discharge status: Home / Self Care | Payer: Self-pay | Source: Home / Self Care | Attending: Internal Medicine

## 2016-05-19 ENCOUNTER — Encounter (HOSPITAL_COMMUNITY): Payer: Self-pay | Admitting: Registered Nurse

## 2016-05-19 HISTORY — PX: I & D EXTREMITY: SHX5045

## 2016-05-19 LAB — GLUCOSE, CAPILLARY
GLUCOSE-CAPILLARY: 130 mg/dL — AB (ref 65–99)
GLUCOSE-CAPILLARY: 85 mg/dL (ref 65–99)
GLUCOSE-CAPILLARY: 212 mg/dL — AB (ref 65–99)
Glucose-Capillary: 155 mg/dL — ABNORMAL HIGH (ref 65–99)
Glucose-Capillary: 88 mg/dL (ref 65–99)

## 2016-05-19 LAB — CBC
HCT: 36.7 % (ref 36.0–46.0)
HEMOGLOBIN: 12.2 g/dL (ref 12.0–15.0)
MCH: 26.8 pg (ref 26.0–34.0)
MCHC: 33.2 g/dL (ref 30.0–36.0)
MCV: 80.5 fL (ref 78.0–100.0)
PLATELETS: 275 10*3/uL (ref 150–400)
RBC: 4.56 MIL/uL (ref 3.87–5.11)
RDW: 13.5 % (ref 11.5–15.5)
WBC: 10.8 10*3/uL — ABNORMAL HIGH (ref 4.0–10.5)

## 2016-05-19 LAB — CREATININE, SERUM
Creatinine, Ser: 1.7 mg/dL — ABNORMAL HIGH (ref 0.44–1.00)
GFR calc Af Amer: 32 mL/min — ABNORMAL LOW (ref >60)
GFR calc non Af Amer: 27 mL/min — ABNORMAL LOW (ref >60)

## 2016-05-19 LAB — PROTIME-INR
INR: 2.61
PROTHROMBIN TIME: 28.5 s — AB (ref 11.4–15.2)

## 2016-05-19 LAB — MRSA PCR SCREENING: MRSA BY PCR: NEGATIVE

## 2016-05-19 SURGERY — MINOR IRRIGATION AND DEBRIDEMENT EXTREMITY
Anesthesia: General | Site: Toe | Laterality: Right

## 2016-05-19 MED ORDER — ACETAMINOPHEN 10 MG/ML IV SOLN
INTRAVENOUS | Status: AC
  Filled 2016-05-19: qty 100

## 2016-05-19 MED ORDER — PROMETHAZINE HCL 25 MG/ML IJ SOLN: 6.2500 mg | INTRAMUSCULAR | Status: DC | PRN

## 2016-05-19 MED ORDER — PROPOFOL 10 MG/ML IV BOLUS
INTRAVENOUS | Status: DC | PRN
  Administered 2016-05-19: 120 mg via INTRAVENOUS

## 2016-05-19 MED ORDER — METOCLOPRAMIDE HCL 5 MG PO TABS: 5.0000 mg | ORAL_TABLET | Freq: Three times a day (TID) | ORAL | Status: DC | PRN

## 2016-05-19 MED ORDER — FENTANYL CITRATE (PF) 100 MCG/2ML IJ SOLN
INTRAMUSCULAR | Status: AC
  Administered 2016-05-19: 50 ug via INTRAVENOUS
  Filled 2016-05-19: qty 2

## 2016-05-19 MED ORDER — SODIUM CHLORIDE 0.9 % IR SOLN
Status: DC | PRN
  Administered 2016-05-19: 1000 mL

## 2016-05-19 MED ORDER — LACTATED RINGERS IV SOLN
INTRAVENOUS | Status: DC | PRN
  Administered 2016-05-19: 13:00:00 via INTRAVENOUS

## 2016-05-19 MED ORDER — LIDOCAINE 2% (20 MG/ML) 5 ML SYRINGE
INTRAMUSCULAR | Status: DC | PRN
  Administered 2016-05-19: 80 mg via INTRAVENOUS

## 2016-05-19 MED ORDER — FENTANYL CITRATE (PF) 100 MCG/2ML IJ SOLN
25.0000 ug | INTRAMUSCULAR | Status: DC | PRN
  Administered 2016-05-19 (×3): 50 ug via INTRAVENOUS
  Administered 2016-05-19: 25 ug via INTRAVENOUS

## 2016-05-19 MED ORDER — INSULIN ASPART 100 UNIT/ML ~~LOC~~ SOLN
0.0000 [IU] | SUBCUTANEOUS | Status: DC
  Administered 2016-05-19: 2 [IU] via SUBCUTANEOUS
  Administered 2016-05-19: 1 [IU] via SUBCUTANEOUS

## 2016-05-19 MED ORDER — FENTANYL CITRATE (PF) 100 MCG/2ML IJ SOLN
INTRAMUSCULAR | Status: AC
  Filled 2016-05-19: qty 2

## 2016-05-19 MED ORDER — WARFARIN SODIUM 2.5 MG PO TABS
2.5000 mg | ORAL_TABLET | Freq: Once | ORAL | Status: AC
  Administered 2016-05-19: 2.5 mg via ORAL
  Filled 2016-05-19: qty 1

## 2016-05-19 MED ORDER — PIPERACILLIN-TAZOBACTAM 3.375 G IVPB
INTRAVENOUS | Status: AC
  Filled 2016-05-19: qty 50

## 2016-05-19 MED ORDER — ONDANSETRON HCL 4 MG PO TABS: 4.0000 mg | ORAL_TABLET | Freq: Four times a day (QID) | ORAL | Status: DC | PRN

## 2016-05-19 MED ORDER — ONDANSETRON HCL 4 MG/2ML IJ SOLN
INTRAMUSCULAR | Status: DC | PRN
  Administered 2016-05-19: 4 mg via INTRAVENOUS

## 2016-05-19 MED ORDER — METHOCARBAMOL 500 MG PO TABS
500.0000 mg | ORAL_TABLET | Freq: Four times a day (QID) | ORAL | Status: DC | PRN
  Administered 2016-05-20: 500 mg via ORAL
  Filled 2016-05-19: qty 1

## 2016-05-19 MED ORDER — METHOCARBAMOL 1000 MG/10ML IJ SOLN
500.0000 mg | Freq: Four times a day (QID) | INTRAVENOUS | Status: DC | PRN
  Filled 2016-05-19: qty 5

## 2016-05-19 MED ORDER — FENTANYL CITRATE (PF) 100 MCG/2ML IJ SOLN
INTRAMUSCULAR | Status: DC | PRN
  Administered 2016-05-19 (×2): 25 ug via INTRAVENOUS
  Administered 2016-05-19: 50 ug via INTRAVENOUS

## 2016-05-19 MED ORDER — ONDANSETRON HCL 4 MG/2ML IJ SOLN
INTRAMUSCULAR | Status: AC
  Filled 2016-05-19: qty 2

## 2016-05-19 MED ORDER — ONDANSETRON HCL 4 MG/2ML IJ SOLN: 4.0000 mg | Freq: Four times a day (QID) | INTRAMUSCULAR | Status: DC | PRN

## 2016-05-19 MED ORDER — HYDROCODONE-ACETAMINOPHEN 5-325 MG PO TABS
1.0000 | ORAL_TABLET | ORAL | Status: DC | PRN
  Administered 2016-05-19 (×2): 1 via ORAL
  Administered 2016-05-20: 2 via ORAL
  Administered 2016-05-20: 1 via ORAL
  Administered 2016-05-20: 2 via ORAL
  Administered 2016-05-21 – 2016-05-22 (×6): 1 via ORAL
  Filled 2016-05-19 (×3): qty 1
  Filled 2016-05-19: qty 2
  Filled 2016-05-19 (×2): qty 1
  Filled 2016-05-19: qty 2
  Filled 2016-05-19 (×2): qty 1
  Filled 2016-05-19: qty 2
  Filled 2016-05-19: qty 1

## 2016-05-19 MED ORDER — ACETAMINOPHEN 325 MG PO TABS: 650.0000 mg | ORAL_TABLET | Freq: Four times a day (QID) | ORAL | Status: DC | PRN

## 2016-05-19 MED ORDER — ACETAMINOPHEN 10 MG/ML IV SOLN
1000.0000 mg | Freq: Once | INTRAVENOUS | Status: AC
  Administered 2016-05-19: 1000 mg via INTRAVENOUS

## 2016-05-19 MED ORDER — INSULIN ASPART 100 UNIT/ML ~~LOC~~ SOLN
0.0000 [IU] | Freq: Three times a day (TID) | SUBCUTANEOUS | Status: DC
  Administered 2016-05-19: 3 [IU] via SUBCUTANEOUS
  Administered 2016-05-20: 1 [IU] via SUBCUTANEOUS
  Administered 2016-05-20: 2 [IU] via SUBCUTANEOUS
  Administered 2016-05-20 (×2): 1 [IU] via SUBCUTANEOUS
  Administered 2016-05-21: 2 [IU] via SUBCUTANEOUS
  Administered 2016-05-21: 3 [IU] via SUBCUTANEOUS
  Administered 2016-05-21 (×2): 1 [IU] via SUBCUTANEOUS
  Administered 2016-05-22: 2 [IU] via SUBCUTANEOUS
  Administered 2016-05-22: 1 [IU] via SUBCUTANEOUS

## 2016-05-19 MED ORDER — ACETAMINOPHEN 650 MG RE SUPP: 650.0000 mg | Freq: Four times a day (QID) | RECTAL | Status: DC | PRN

## 2016-05-19 MED ORDER — FENTANYL CITRATE (PF) 100 MCG/2ML IJ SOLN
INTRAMUSCULAR | Status: AC
Start: 2016-05-19 — End: 2016-05-19
  Administered 2016-05-19: 50 ug via INTRAVENOUS
  Filled 2016-05-19: qty 2

## 2016-05-19 MED ORDER — PROPOFOL 10 MG/ML IV BOLUS
INTRAVENOUS | Status: AC
  Filled 2016-05-19: qty 20

## 2016-05-19 MED ORDER — METOCLOPRAMIDE HCL 5 MG/ML IJ SOLN: 5.0000 mg | Freq: Three times a day (TID) | INTRAMUSCULAR | Status: DC | PRN

## 2016-05-19 MED ORDER — SODIUM CHLORIDE 0.9 % IV SOLN
INTRAVENOUS | Status: DC
  Administered 2016-05-19 – 2016-05-22 (×3): via INTRAVENOUS

## 2016-05-19 SURGICAL SUPPLY — 32 items
BAG ZIPLOCK 12X15 (MISCELLANEOUS) ×2 IMPLANT
BANDAGE ELASTIC 6 VELCRO ST LF (GAUZE/BANDAGES/DRESSINGS) ×2 IMPLANT
BLADE MIC 41X13 (BLADE) ×2 IMPLANT
BLADE SURG SZ10 CARB STEEL (BLADE) ×4 IMPLANT
BNDG GAUZE ELAST 4 BULKY (GAUZE/BANDAGES/DRESSINGS) ×2 IMPLANT
DRAIN PENROSE 18X1/2 LTX STRL (DRAIN) ×2 IMPLANT
DRAIN PENROSE 18X1/4 LTX STRL (WOUND CARE) ×2 IMPLANT
DRSG ADAPTIC 3X8 NADH LF (GAUZE/BANDAGES/DRESSINGS) ×2 IMPLANT
DRSG EMULSION OIL 3X16 NADH (GAUZE/BANDAGES/DRESSINGS) ×2 IMPLANT
DRSG PAD ABDOMINAL 8X10 ST (GAUZE/BANDAGES/DRESSINGS) ×2 IMPLANT
ELECT REM PT RETURN 9FT ADLT (ELECTROSURGICAL) ×2
ELECTRODE REM PT RTRN 9FT ADLT (ELECTROSURGICAL) ×1 IMPLANT
GAUZE SPONGE 4X4 12PLY STRL (GAUZE/BANDAGES/DRESSINGS) ×2 IMPLANT
GLOVE BIO SURGEON STRL SZ7.5 (GLOVE) ×2 IMPLANT
GLOVE BIO SURGEON STRL SZ8 (GLOVE) ×4 IMPLANT
GLOVE BIOGEL PI IND STRL 8 (GLOVE) ×1 IMPLANT
GLOVE BIOGEL PI INDICATOR 8 (GLOVE) ×1
GOWN STRL REUS W/TWL LRG LVL3 (GOWN DISPOSABLE) ×2 IMPLANT
GOWN STRL REUS W/TWL XL LVL3 (GOWN DISPOSABLE) ×2 IMPLANT
KIT BASIN OR (CUSTOM PROCEDURE TRAY) ×2 IMPLANT
MANIFOLD NEPTUNE II (INSTRUMENTS) ×2 IMPLANT
NS IRRIG 1000ML POUR BTL (IV SOLUTION) ×2 IMPLANT
PACK ORTHO EXTREMITY (CUSTOM PROCEDURE TRAY) ×2 IMPLANT
PAD ABD 8X10 STRL (GAUZE/BANDAGES/DRESSINGS) ×2 IMPLANT
PADDING CAST COTTON 6X4 STRL (CAST SUPPLIES) ×4 IMPLANT
POSITIONER SURGICAL ARM (MISCELLANEOUS) ×2 IMPLANT
SUT ETHILON 3 0 PS 1 (SUTURE) ×2 IMPLANT
SUT NYLON 3 0 (SUTURE) ×2 IMPLANT
SWAB COLLECTION DEVICE MRSA (MISCELLANEOUS) ×2 IMPLANT
SWABSTICK PVP 13S (MISCELLANEOUS) ×2 IMPLANT
TOWEL OR 17X26 10 PK STRL BLUE (TOWEL DISPOSABLE) ×4 IMPLANT
WATER STERILE IRR 1500ML POUR (IV SOLUTION) ×2 IMPLANT

## 2016-05-19 NOTE — Brief Op Note (Signed)
05/15/2016 - 05/19/2016  2:31 PM  PATIENT:  [name]  83 y.o. female  PRE-OPERATIVE DIAGNOSIS:  cellultis/abscess possible osteomyelitis of right great toe  POST-OPERATIVE DIAGNOSIS:  Infected great toe  PROCEDURE:  Procedure(s): MINOR IRRIGATION AND DEBRIDEMENT Right great toe (Right)  SURGEON:  Surgeon(s) and Role:    * Gaynelle Arabian, MD - Primary  PHYSICIAN ASSISTANT:   ASSISTANTS: none   ANESTHESIA:   general  EBL:  Total I/O In: -  Out: 5 [Blood:5]  BLOOD ADMINISTERED:none  DRAINS: none   LOCAL MEDICATIONS USED:  NONE  COUNTS:  YES  TOURNIQUET:   Total Tourniquet Time Documented: Thigh (Right) - 13 minutes Total: Thigh (Right) - 13 minutes   DICTATION: .Other Dictation: Dictation Number [id number]  PLAN OF CARE: Admit to inpatient   PATIENT DISPOSITION:  PACU - hemodynamically stable.

## 2016-05-19 NOTE — Progress Notes (Signed)
[place] for warfarin Indication: Afib  Allergies  Allergen Reactions  . Lotensin [Benazepril Hcl] Anaphylaxis  . Zantac [Ranitidine Hcl] Anaphylaxis  . Tape Itching and Rash   Patient Measurements: Height: 5\' 6"  (167.6 cm) Weight: 190 lb 4.1 oz (86.3 kg) IBW/kg (Calculated) : 59.3  Vital Signs: Temp: 98.7 F (37.1 C) (02/26 0425) Temp Source: Oral (02/26 0425) BP: 137/68 (02/26 0425) Pulse Rate: 57 (02/26 0425)  Labs:  Recent Labs  05/17/16 0543 05/18/16 0524 05/19/16 0521  HGB 11.3*  --  12.2  HCT 34.8*  --  36.7  PLT 225  --  275  LABPROT 29.1* 28.5* 28.5*  INR 2.69 2.62 2.61  CREATININE 1.72* 1.63* 1.70*    Estimated Creatinine Clearance: 29.2 mL/min (by C-G formula based on SCr of 1.7 mg/dL (H)).  Medical History: Past Medical History:  Diagnosis Date  . Diabetes mellitus without complication ([place])   . Fall at home Sept. 3, 2016   Fx  3 ribs  . Hypertension   . Ileus, postoperative ([place]) 12/17/2012  . Irregular heart beat   . Renal disorder    Medications: this admission Scheduled:  . acidophilus  1 capsule Oral QPM  . amiodarone  100 mg Oral Daily  . amLODipine  5 mg Oral Q1200  . atorvastatin  40 mg Oral QPM  . colchicine  0.6 mg Oral BID  . ferrous sulfate  325 mg Oral Q breakfast  . furosemide  40 mg Oral q1800  . furosemide  80 mg Oral Q breakfast  . insulin aspart  0-5 Units Subcutaneous QHS  . insulin aspart  0-9 Units Subcutaneous TID WC  . metoprolol  50 mg Oral BID  . multivitamin with minerals  1 tablet Oral Daily  . pantoprazole  40 mg Oral QPM  . piperacillin-tazobactam (ZOSYN)  IV  3.375 g Intravenous Q8H  . potassium chloride  30 mEq Oral BID  . sodium chloride flush  3 mL Intravenous Q12H  . vancomycin  1,000 mg Intravenous Q24H  . Warfarin - Pharmacist Dosing Inpatient   Does not apply q1800   Assessment: 50 yoF known to pharmacy from antibiotic dosing. PMH DMT2, HTN, HLD, gout, AFib on  warfarin, dCHF, CKD4. Admitted for cellulitis/osteo/gout flare of R great toe; pharmacy to dose warfarin while admitted.   Baseline INR therapeutic  Prior anticoagulation: warfarin 5 mg daily except 2.5 mg on Mondays; last dose 2/21.  Significant events:  Today, 05/19/2016:  CBC: stable  INR 2.61, therapeutic on home regimen  Major drug interactions: none, broad-spectrum abx could increase INR  No bleeding issues per nursing  Regular diet ordered- eating 100%, NPO overnight for I&D today.   Goal of Therapy: INR 2-3  Plan: 1) Coumadin 2.5mg  po x1 tonight per home regimen 2) Daily INR  Netta Cedars, PharmD, BCPS Pager: [phone] 05/19/2016, 7:35 AM

## 2016-05-19 NOTE — Anesthesia Procedure Notes (Signed)
Procedure Name: LMA Insertion Date/Time: 05/19/2016 1:38 PM Performed by: Carleene Cooper A Pre-anesthesia Checklist: Patient identified, Timeout performed, Emergency Drugs available, Suction available and Patient being monitored Patient Re-evaluated:Patient Re-evaluated prior to inductionOxygen Delivery Method: Circle system utilized Preoxygenation: Pre-oxygenation with 100% oxygen Intubation Type: IV induction LMA: LMA with gastric port inserted LMA Size: 4.0 Number of attempts: 1 Placement Confirmation: positive ETCO2 and breath sounds checked- equal and bilateral Tube secured with: Tape Dental Injury: Teeth and Oropharynx as per pre-operative assessment

## 2016-05-19 NOTE — H&P (View-Only) (Signed)
Subjective: Patient feels like her foot is continuing to improve. Has pain only when she hangs her foot in a dependent position for a few minutes.   Objective: Vital signs in last 24 hours: Temp:  [98.1 F (36.7 C)-99 F (37.2 C)] 99 F (37.2 C) (02/24 0605) Pulse Rate:  [57-63] 63 (02/24 0605) Resp:  [18] 18 (02/24 0605) BP: (141-168)/(53-62) 168/56 (02/24 0605) SpO2:  [98 %-100 %] 98 % (02/24 0605)  Intake/Output from previous day: 02/23 0701 - 02/24 0700 In: 950 [P.O.:600; IV Piggyback:350] Out: 6 [Urine:3; Stool:3] Intake/Output this shift: No intake/output data recorded.   Recent Labs  05/15/16 1556 05/16/16 0542 05/17/16 0543  HGB 12.1 11.3* 11.3*    Recent Labs  05/16/16 0542 05/17/16 0543  WBC 11.6* 11.0*  RBC 4.14 4.31  HCT 33.4* 34.8*  PLT 232 225    Recent Labs  05/16/16 0542 05/17/16 0543  NA 139 140  K 3.7 3.4*  CL 107 107  CO2 25 27  BUN 30* 25*  CREATININE 1.61* 1.72*  GLUCOSE 138* 141*  CALCIUM 8.5* 8.9    Recent Labs  05/16/16 1035 05/17/16 0543  INR 2.54 2.69    right great toe swelling greatly improved. no drainage or erythema from sight of previous gouty eruption  Assessment/Plan: Gouty eruption right great toe- continues to improve. No indication for surgical intervention. Should not need antibiotics post discharge. Will sign off at this time. Call if any other questions or concerns.   [name] 05/17/2016, 7:27 AM

## 2016-05-19 NOTE — Interval H&P Note (Signed)
History and Physical Interval Note:  05/19/2016 1:26 PM  [name]  has presented today for surgery, with the diagnosis of cellultis/abscess possible osteomyelitis of right great toe  The various methods of treatment have been discussed with the patient and family. After consideration of risks, benefits and other options for treatment, the patient has consented to  Procedure(s): MINOR IRRIGATION AND DEBRIDEMENT Right great toe (Right) as a surgical intervention .  The patient's history has been reviewed, patient examined, no change in status, stable for surgery.  I have reviewed the patient's chart and labs.  Questions were answered to the patient's satisfaction.     [name]

## 2016-05-19 NOTE — Anesthesia Postprocedure Evaluation (Addendum)
Anesthesia Post Note  Patient: [name]  Procedure(s) Performed: Procedure(s) (LRB): MINOR IRRIGATION AND DEBRIDEMENT Right great toe (Right)  Patient location during evaluation: PACU Anesthesia Type: General Level of consciousness: awake and alert Pain management: pain level controlled Vital Signs Assessment: post-procedure vital signs reviewed and stable Respiratory status: spontaneous breathing, nonlabored ventilation, respiratory function stable and patient connected to nasal cannula oxygen Cardiovascular status: blood pressure returned to baseline and stable Postop Assessment: no signs of nausea or vomiting Anesthetic complications: no       Last Vitals:  Vitals:   05/19/16 0425 05/19/16 1439  BP: 137/68 (!) 177/75  Pulse: (!) 57 60  Resp: 18   Temp: 37.1 C 36.7 C    Last Pain:  Vitals:   05/19/16 1439  TempSrc:   PainSc: 10-Worst pain ever                 Jayven Naill S

## 2016-05-19 NOTE — Anesthesia Preprocedure Evaluation (Signed)
Anesthesia Evaluation  Patient identified by MRN, date of birth, ID band Patient awake    Reviewed: Allergy & Precautions, NPO status , Patient's Chart, lab work & pertinent test results  Airway Mallampati: II  TM Distance: >3 FB Neck ROM: Full    Dental no notable dental hx.    Pulmonary neg pulmonary ROS, former smoker,    Pulmonary exam normal breath sounds clear to auscultation       Cardiovascular hypertension, + dysrhythmias Atrial Fibrillation  Rhythm:Irregular Rate:Normal     Neuro/Psych negative neurological ROS  negative psych ROS   GI/Hepatic negative GI ROS, Neg liver ROS,   Endo/Other  diabetes  Renal/GU negative Renal ROS  negative genitourinary   Musculoskeletal negative musculoskeletal ROS (+)   Abdominal   Peds negative pediatric ROS (+)  Hematology negative hematology ROS (+)   Anesthesia Other Findings   Reproductive/Obstetrics negative OB ROS                             Anesthesia Physical Anesthesia Plan  ASA: III  Anesthesia Plan: General   Post-op Pain Management:    Induction: Intravenous  Airway Management Planned: LMA  Additional Equipment:   Intra-op Plan:   Post-operative Plan: Extubation in OR  Informed Consent: I have reviewed the patients History and Physical, chart, labs and discussed the procedure including the risks, benefits and alternatives for the proposed anesthesia with the patient or authorized representative who has indicated his/her understanding and acceptance.   Dental advisory given  Plan Discussed with: CRNA and Surgeon  Anesthesia Plan Comments:         Anesthesia Quick Evaluation

## 2016-05-19 NOTE — Transfer of Care (Signed)
Immediate Anesthesia Transfer of Care Note  Patient: [name]  Procedure(s) Performed: Procedure(s): MINOR IRRIGATION AND DEBRIDEMENT Right great toe (Right)  Patient Location: PACU  Anesthesia Type:General  Level of Consciousness: awake, alert , oriented and patient cooperative  Airway & Oxygen Therapy: Patient Spontanous Breathing and Patient connected to face mask oxygen  Post-op Assessment: Report given to RN, Post -op Vital signs reviewed and stable and Patient moving all extremities  Post vital signs: Reviewed and stable  Last Vitals:  Vitals:   05/19/16 0425 05/19/16 1439  BP: 137/68 (!) (P) 177/75  Pulse: (!) 57 (P) 60  Resp: 18   Temp: 37.1 C (P) 36.7 C    Last Pain:  Vitals:   05/19/16 0815  TempSrc:   PainSc: 0-No pain      Patients Stated Pain Goal: 0 (81/44/81 8563)  Complications: No apparent anesthesia complications

## 2016-05-19 NOTE — Care Management Important Message (Signed)
Important Message  Patient Details  Name: LYNN RECENDIZ MRN: [id number] Date of Birth: 08-24-1933   Medicare Important Message Given:  Yes    Kerin Salen 05/19/2016, 12:20 [place] Message  Patient Details  Name: MARCELIA PETERSEN MRN: [id number] Date of Birth: 12-29-1933   Medicare Important Message Given:  Yes    Kerin Salen 05/19/2016, 12:20 PM

## 2016-05-19 NOTE — Progress Notes (Signed)
Subjective: Patient with increased drainage right great toe since I saw her 2 days ago. Had MRI showing abscess and changes suggestive of osteomyelitis   Objective: Vital signs in last 24 hours: Temp:  [98.4 F (36.9 C)-98.7 F (37.1 C)] 98.7 F (37.1 C) (02/26 0425) Pulse Rate:  [52-57] 57 (02/26 0425) Resp:  [18-19] 18 (02/26 0425) BP: (137-180)/(60-68) 137/68 (02/26 0425) SpO2:  [97 %-100 %] 99 % (02/26 0425) Weight:  [83.9 kg (185 lb)-86.3 kg (190 lb 4.1 oz)] 86.3 kg (190 lb 4.1 oz) (02/26 0425)  Intake/Output from previous day: 02/25 0701 - 02/26 0700 In: 1660 [P.O.:960; IV Piggyback:700] Out: -  Intake/Output this shift: Total I/O In: 540 [P.O.:240; IV Piggyback:300] Out: -    Recent Labs  05/17/16 0543 05/19/16 0521  HGB 11.3* 12.2    Recent Labs  05/17/16 0543 05/19/16 0521  WBC 11.0* 10.8*  RBC 4.31 4.56  HCT 34.8* 36.7  PLT 225 275    Recent Labs  05/17/16 0543 05/18/16 0524 05/19/16 0521  NA 140 139  --   K 3.4* 3.6  --   CL 107 102  --   CO2 27 29  --   BUN 25* 23*  --   CREATININE 1.72* 1.63* 1.70*  GLUCOSE 141* 156*  --   CALCIUM 8.9 9.3  --     Recent Labs  05/18/16 0524 05/19/16 0521  INR 2.62 2.61    Right great toe with drainage from dorsum at IP joint level  Assessment/Plan: Right great toe abscess- Plan irrigation and debridement. Will attempt to save great toe but if debridement unsuccessful may eventually necessitate amputation. Discussed in detail with patient who elects to proceed.   [name] 05/19/2016, 6:47 AM

## 2016-05-19 NOTE — Progress Notes (Signed)
PROGRESS NOTE  [name]  ZOX:[id number] DOB: 12/24/1933  DOA: 05/15/2016 PCP: Aretta Nip, MD   Brief Narrative:  83 y.o. female , Married, lives with spouse, ambulates with the help of a walker, PMH of type II DM with renal complications/IDDM, HTN, HLD, gout, A. fib on Coumadin, chronic diastolic CHF, stage IV chronic kidney disease, presented to ED on 05/15/16 due to worsening pain, swelling, redness and drainage from right great toe. She gives history of eruptions from tophaceous deposits in the past in her fingers and elbow. Admitted for cellulitis complicating tophaceous gout Vs abscess & osteomyelitis. Initially treated as cellulitis complicating tophaceous gout but subsequently concerning for abscess. MRI of right foot 2/25 confirmed abscess and possible osteomyelitis. Orthopedics consulting and plan I&D on 2/26.   Assessment & Plan:   Principal Problem:   Cellulitis of great toe of right foot Active Problems:   HTN (hypertension)   Dyslipidemia   Atrial fibrillation (HCC)   Chronic kidney disease, stage IV (severe) (HCC)   Hyperlipidemia   GERD (gastroesophageal reflux disease)   Diabetes mellitus with renal complications (HCC)   Chronic diastolic heart failure ([place])    1. Cellulitis of the right great toe complicating tophaceous gout Vs Abscess & osteomyelitis: Patient was initially felt to have cellulitis, possible abscess and osteomyelitis. However given her prior history of eruptions from tophaceous gout (currently has one other such eruption on her right thumb) and clinical picture, it was felt that she has cellulitis complicating underlying tophaceous gout. Orthopedics was consulted and initially recommended nonoperative management with foot elevation and IV antibiotics. Continued empiric IV vancomycin and Zosyn. Although she improved, she had an area over the dorsum of her right great toe which seemed fluctuant and concerning for abscess and hence MRI right foot  performed on 2/25 which shows features consistent with abscess of right great toe and osteomyelitis. Orthopedics follow-up appreciated and plan I&D on 2/26. 2. Uncontrolled type II DM with renal complications: Held Victoza. Placed on NovoLog SSI. Monitor closely and depending on CBGs may consider Lantus. Reasonable inpatient control. Changed SSI to every 4 hours while nothing by mouth and postop can resume before meals and at bedtime. 3. Essential hypertension: Mildly uncontrolled. Continue home medications and monitor closely. 4. Chronic diastolic CHF: She seems to have chronic lower extremity edema but otherwise appears compensated. Continue Lasix and when necessary metolazone. 5. Stage IV chronic kidney disease: Creatinine appears at baseline. 6. Gout:  management as per problem #1. Consider outpatient rheumatology consultation for management of tophaceous gout. 7. Hyperlipidemia: Continue statins. 8. Paroxysmal A. fib: Controlled ventricular rate. Continue amiodarone and Coumadin. INR therapeutic 9. GERD: PPI. 10. Anemia: Stable. 11. Hypokalemia: Replaced.   DVT prophylaxis: Anticoagulated on Coumadin  Code Status: Full Family Communication: Discussed with patient's spouse at bedside. Updated care and answered questions. Disposition Plan: DC home when medically improved. Discharge date not clear at this time.   Consultants:   Orthopedics  Procedures:   None  Antimicrobials:   IV vancomycin 2/22 >  IV Zosyn 2/22 >    Subjective: Some pain and swelling of right big toe. Seen this morning prior to procedure. Is NPO. As per RN, no acute issues.  Objective:  Vitals:   05/18/16 0909 05/18/16 1700 05/18/16 2053 05/19/16 0425  BP:  (!) 180/60 (!) 148/63 137/68  Pulse:  (!) 52 (!) 56 (!) 57  Resp:  18 19 18   Temp:  98.4 F (36.9 C) 98.4 F (36.9 C) 98.7 F (  37.1 C)  TempSrc:  Oral Oral Oral  SpO2:  100% 97% 99%  Weight: 84 kg (185 lb 3 oz)   86.3 kg (190 lb 4.1 oz)    Height:        Intake/Output Summary (Last 24 hours) at 05/19/16 1053 Last data filed at 05/19/16 0508  Gross per 24 hour  Intake             1420 ml  Output                0 ml  Net             1420 ml   Filed Weights   05/15/16 1907 05/18/16 0909 05/19/16 0425  Weight: 83.5 kg (184 lb 1.4 oz) 84 kg (185 lb 3 oz) 86.3 kg (190 lb 4.1 oz)    Examination:  General exam: Pleasant elderly female lying comfortably propped up in bed. Respiratory system: Clear to auscultation. Respiratory effort normal. Cardiovascular system: S1 & S2 heard, RRR. No JVD, murmurs, rubs, gallops or clicks. No pedal edema. Gastrointestinal system: Abdomen is nondistended, soft and nontender. No organomegaly or masses felt. Normal bowel sounds heard. Central nervous system: Alert and oriented. No focal neurological deficits. Extremities: Symmetric 5 x 5 power. Skin: Left lower extremity below mid leg shows chronic edema with hyperpigmentation of skin and thickening of skin without open wounds and no acute findings. Moderate swelling, mild erythema and tenderness of right great toe with fluctuant area and drainage over dorsum of right great toe. Draining wound on the medial aspect of the great toe is not draining anymore. Also has subcutaneous tophaceous gout on distal phalanx of right thumb. Psychiatry: Judgement and insight appear normal. Mood & affect appropriate.     Data Reviewed: I have personally reviewed following labs and imaging studies  CBC:  Recent Labs Lab 05/15/16 1556 05/16/16 0542 05/17/16 0543 05/19/16 0521  WBC 12.7* 11.6* 11.0* 10.8*  NEUTROABS 9.3*  --   --   --   HGB 12.1 11.3* 11.3* 12.2  HCT 36.8 33.4* 34.8* 36.7  MCV 81.1 80.7 80.7 80.5  PLT 254 232 225 601   Basic Metabolic Panel:  Recent Labs Lab 05/15/16 1556 05/16/16 0542 05/17/16 0543 05/18/16 0524 05/19/16 0521  NA 139 139 140 139  --   K 3.6 3.7 3.4* 3.6  --   CL 106 107 107 102  --   CO2 25 25 27 29   --    GLUCOSE 141* 138* 141* 156*  --   BUN 32* 30* 25* 23*  --   CREATININE 1.74* 1.61* 1.72* 1.63* 1.70*  CALCIUM 8.9 8.5* 8.9 9.3  --    GFR: Estimated Creatinine Clearance: 29.2 mL/min (by C-G formula based on SCr of 1.7 mg/dL (H)). Liver Function Tests:  Recent Labs Lab 05/15/16 1556  AST 19  ALT 18  ALKPHOS 51  BILITOT 0.5  PROT 7.0  ALBUMIN 3.1*   No results for input(s): LIPASE, AMYLASE in the last 168 hours. No results for input(s): AMMONIA in the last 168 hours. Coagulation Profile:  Recent Labs Lab 05/15/16 2010 05/16/16 1035 05/17/16 0543 05/18/16 0524 05/19/16 0521  INR 2.48 2.54 2.69 2.62 2.61   Cardiac Enzymes: No results for input(s): CKTOTAL, CKMB, CKMBINDEX, TROPONINI in the last 168 hours. BNP (last 3 results) No results for input(s): PROBNP in the last 8760 hours. HbA1C: No results for input(s): HGBA1C in the last 72 hours. CBG:  Recent Labs Lab 05/18/16 0723 05/18/16  1137 05/18/16 1717 05/18/16 2139 05/19/16 0741  GLUCAP 172* 214* 112* 224* 155*   Lipid Profile: No results for input(s): CHOL, HDL, LDLCALC, TRIG, CHOLHDL, LDLDIRECT in the last 72 hours. Thyroid Function Tests: No results for input(s): TSH, T4TOTAL, FREET4, T3FREE, THYROIDAB in the last 72 hours. Anemia Panel: No results for input(s): VITAMINB12, FOLATE, FERRITIN, TIBC, IRON, RETICCTPCT in the last 72 hours.  Sepsis Labs:  Recent Labs Lab 05/15/16 1556 05/15/16 1610 05/16/16 0542 05/17/16 0543 05/19/16 0521  WBC 12.7*  --  11.6* 11.0* 10.8*  LATICACIDVEN  --  1.12  --   --   --     Recent Results (from the past 240 hour(s))  MRSA PCR Screening     Status: None   Collection Time: 05/19/16  8:06 AM  Result Value Ref Range Status   MRSA by PCR NEGATIVE NEGATIVE Final    Comment:        The GeneXpert MRSA Assay (FDA approved for NASAL specimens only), is one component of a comprehensive MRSA colonization surveillance program. It is not intended to diagnose  MRSA infection nor to guide or monitor treatment for MRSA infections.          Radiology Studies: Mr Foot Right Wo Contrast  Result Date: 05/18/2016 CLINICAL DATA:  Ulceration and cellulitis of the right great toe in a diabetic patient. The patient first noticed changes in the toe approximately 1 week ago. EXAM: MRI OF THE RIGHT FOREFOOT WITHOUT CONTRAST TECHNIQUE: Multiplanar, multisequence MR imaging of the right foot was performed. No intravenous contrast was administered. COMPARISON:  Plain films of the right foot 05/15/2016. FINDINGS: Bones/Joint/Cartilage Marked marrow edema is seen throughout the proximal and distal phalanges of the great toe, worst in the distal phalanx. Patchy marrow edema extends into the first metatarsal to the level of the proximal metaphysis. Edema in the first metatarsal is most intense in its distal 2.5 cm. No fracture is identified. Ligaments Intact. Muscles and Tendons Intact. Soft tissues A focal fluid collection extends from the dorsal aspect of the IP joint of the great toe to the level the first MTP joint. It measures 3.3 cm long by approximately 2.1 cm transverse by 1 cm craniocaudal. At the level of the neck of the proximal phalanx, the collection is circum pharyngeal about the proximal phalanx extending in the plantar soft tissues at the base of the distal phalanx. Tiny focus of susceptibility artifact in the cutaneous tissues thumb distal phalanx is compatible with the presence of a foreign body, possibly microscopic. Skin ulceration along the medial aspect of the tuft is identified. There is some soft tissue swelling over the dorsum of the foot. IMPRESSION: Skin ulceration with a large abscess extending the length of the proximal phalanx of the great toe over its dorsal margin. The abscess is circumferential about the head and neck of the proximal phalanx of the great toe and extends in the plantar soft tissues of the toe along the distal phalanx.  Osteomyelitis throughout the proximal and distal phalanges of the great toe. Marrow signal abnormality in the distal 2.5 cm of the first metatarsal is consistent with osteomyelitis. Milder degree of marrow edema extends in the first metatarsal to the proximal metaphysis. This signal change could be reactive but is worrisome for osteomyelitis. Electronically Signed   By: Inge Rise M.D.   On: 05/18/2016 13:43        Scheduled Meds: . acidophilus  1 capsule Oral QPM  . amiodarone  100 mg Oral  Daily  . amLODipine  5 mg Oral Q1200  . atorvastatin  40 mg Oral QPM  . colchicine  0.6 mg Oral BID  . ferrous sulfate  325 mg Oral Q breakfast  . furosemide  40 mg Oral q1800  . furosemide  80 mg Oral Q breakfast  . insulin aspart  0-9 Units Subcutaneous Q4H  . metoprolol  50 mg Oral BID  . multivitamin with minerals  1 tablet Oral Daily  . pantoprazole  40 mg Oral QPM  . piperacillin-tazobactam (ZOSYN)  IV  3.375 g Intravenous Q8H  . potassium chloride  30 mEq Oral BID  . sodium chloride flush  3 mL Intravenous Q12H  . vancomycin  1,000 mg Intravenous Q24H  . warfarin  2.5 mg Oral ONCE-1800  . Warfarin - Pharmacist Dosing Inpatient   Does not apply q1800   Continuous Infusions:   LOS: 4 days      [facility], MD [facility] Pager [phone]  If 7PM-7AM, please contact night-coverage [email or web address] Password [facility] 05/19/2016, 10:53 AM

## 2016-05-19 NOTE — Op Note (Signed)
[name], [name] NO.:  [id number]  MEDICAL RECORD NO.:  [id number]  LOCATION:                                 FACILITY:  PHYSICIAN:  Gaynelle Arabian, M.D.    DATE OF BIRTH:  10-17-1933  DATE OF PROCEDURE:  05/19/2016 DATE OF DISCHARGE:                              OPERATIVE REPORT   PREOPERATIVE DIAGNOSIS:  Right great toe abscess.  POSTOPERATIVE DIAGNOSIS:  Right great toe abscess.  PROCEDURE:  Irrigation and debridement of right great toe.  SURGEON:  Gaynelle Arabian, M.D.  ASSISTANT:  None.  ANESTHESIA:  General.  ESTIMATED BLOOD LOSS:  Minimal.  DRAINS:  None.  TOURNIQUET TIME:  13 minutes at 300 mmHg.  COMPLICATIONS:  None.  CONDITION:  Stable to recovery.  BRIEF CLINICAL NOTE:  [name] is an 83 year old female with diabetes and gout who presented 4 days ago to the emergency room with a swollen right great toe with tophaceous material draining from the toe.  She also had an elevated white blood cell count.  X-rays were questionable for osteomyelitis of the distal phalanx.  She has been getting IV vancomycin and Zosyn.  For the first two days, the toe appeared better, but then in the past 36 hours it is worsened with increased drainage. She had an MRI yesterday suggesting osteomyelitis of the proximal and distal phalanx with edema in the distal metatarsal.  She presents now for irrigation and debridement.  PROCEDURE IN DETAIL:  After successful administration of general anesthetic, tourniquet was placed on the right calf, the right lower extremity was prepped and draped in the usual sterile fashion. Extremity was elevated, tourniquet was inflated to 300 mmHg.  An incision was made on the medial aspect of the great toe starting at the distal metatarsal level and coursing distally to where the drainage was occurring.  I ellipsed out the area distally.  The skin was cut with a 10 blade through the subcutaneous tissue.  Pus was unidentified  at the plantar aspect of the great toe distally.  Cultures were sent.  Gram stain, culture, sensitivity, anaerobic, and anaerobic cultures.  The abnormal tissue was debrided.  It was debrided down to periosteum, but no evidence of any purulent pockets in the bone, no evidence of any bony destruction.  I thoroughly irrigated the toe with about 500 mL of saline.  There was also an eruption occurred at a plantar aspect of the toe.  I thoroughly cored that out and debrided it.  We then released tourniquet for a total time of 13 minutes.  Minor bleeding was stopped with cautery.  There is definitely flow to the distal aspect of the toe.  We then closed subcu with interrupted 2-0 Vicryl and skin with interrupted 4-0 nylon.  The incision was cleaned and dried and a bulky sterile dressing applied. She is awakened and transported to recovery in stable condition.     Gaynelle Arabian, M.D.     FA/MEDQ  D:  05/19/2016  T:  05/19/2016  Job:  [id number]

## 2016-05-20 DIAGNOSIS — L03031 Cellulitis of right toe: Secondary | ICD-10-CM

## 2016-05-20 LAB — BASIC METABOLIC PANEL
Anion gap: 6 (ref 5–15)
BUN: 22 mg/dL — AB (ref 6–20)
CHLORIDE: 106 mmol/L (ref 101–111)
CO2: 25 mmol/L (ref 22–32)
CREATININE: 1.71 mg/dL — AB (ref 0.44–1.00)
Calcium: 8.5 mg/dL — ABNORMAL LOW (ref 8.9–10.3)
GFR calc Af Amer: 31 mL/min — ABNORMAL LOW (ref >60)
GFR calc non Af Amer: 27 mL/min — ABNORMAL LOW (ref >60)
Glucose, Bld: 131 mg/dL — ABNORMAL HIGH (ref 65–99)
POTASSIUM: 3.9 mmol/L (ref 3.5–5.1)
Sodium: 137 mmol/L (ref 135–145)

## 2016-05-20 LAB — CBC
HEMATOCRIT: 35.4 % — AB (ref 36.0–46.0)
HEMOGLOBIN: 11.5 g/dL — AB (ref 12.0–15.0)
MCH: 26.6 pg (ref 26.0–34.0)
MCHC: 32.5 g/dL (ref 30.0–36.0)
MCV: 81.8 fL (ref 78.0–100.0)
Platelets: 246 10*3/uL (ref 150–400)
RBC: 4.33 MIL/uL (ref 3.87–5.11)
RDW: 13.5 % (ref 11.5–15.5)
WBC: 13.3 10*3/uL — ABNORMAL HIGH (ref 4.0–10.5)

## 2016-05-20 LAB — GLUCOSE, CAPILLARY
GLUCOSE-CAPILLARY: 200 mg/dL — AB (ref 65–99)
Glucose-Capillary: 126 mg/dL — ABNORMAL HIGH (ref 65–99)
Glucose-Capillary: 137 mg/dL — ABNORMAL HIGH (ref 65–99)
Glucose-Capillary: 124 mg/dL — ABNORMAL HIGH (ref 65–99)

## 2016-05-20 LAB — PROTIME-INR
INR: 3.23
Prothrombin Time: 33.7 seconds — ABNORMAL HIGH (ref 11.4–15.2)

## 2016-05-20 MED ORDER — ALUM & MAG HYDROXIDE-SIMETH 200-200-20 MG/5ML PO SUSP
15.0000 mL | ORAL | Status: DC | PRN
  Administered 2016-05-20: 15 mL via ORAL
  Filled 2016-05-20: qty 30

## 2016-05-20 NOTE — Progress Notes (Signed)
[place] for warfarin Indication: Afib  Allergies  Allergen Reactions  . Lotensin [Benazepril Hcl] Anaphylaxis  . Zantac [Ranitidine Hcl] Anaphylaxis  . Tape Itching and Rash   Patient Measurements: Height: 5\' 6"  (167.6 cm) Weight: 190 lb 4.1 oz (86.3 kg) IBW/kg (Calculated) : 59.3  Vital Signs: Temp: 99.6 F (37.6 C) (02/27 0509) Temp Source: Oral (02/27 0509) BP: 132/60 (02/27 0509) Pulse Rate: 57 (02/27 0540)  Labs:  Recent Labs  05/18/16 0524 05/19/16 0521 05/20/16 0501  HGB  --  12.2 11.5*  HCT  --  36.7 35.4*  PLT  --  275 246  LABPROT 28.5* 28.5* 33.7*  INR 2.62 2.61 3.23  CREATININE 1.63* 1.70* 1.71*    Estimated Creatinine Clearance: 29 mL/min (by C-G formula based on SCr of 1.71 mg/dL (H)).  Medical History: Past Medical History:  Diagnosis Date  . Diabetes mellitus without complication ([place])   . Fall at home Sept. 3, 2016   Fx  3 ribs  . Hypertension   . Ileus, postoperative ([place]) 12/17/2012  . Irregular heart beat   . Renal disorder    Medications: this admission Scheduled:  . acidophilus  1 capsule Oral QPM  . amiodarone  100 mg Oral Daily  . amLODipine  5 mg Oral Q1200  . atorvastatin  40 mg Oral QPM  . colchicine  0.6 mg Oral BID  . ferrous sulfate  325 mg Oral Q breakfast  . furosemide  40 mg Oral q1800  . furosemide  80 mg Oral Q breakfast  . insulin aspart  0-9 Units Subcutaneous TID AC & HS  . metoprolol  50 mg Oral BID  . multivitamin with minerals  1 tablet Oral Daily  . pantoprazole  40 mg Oral QPM  . piperacillin-tazobactam (ZOSYN)  IV  3.375 g Intravenous Q8H  . potassium chloride  30 mEq Oral BID  . sodium chloride flush  3 mL Intravenous Q12H  . vancomycin  1,000 mg Intravenous Q24H  . Warfarin - Pharmacist Dosing Inpatient   Does not apply q1800   Assessment: 83 yoF known to pharmacy from antibiotic dosing. PMH DMT2, HTN, HLD, gout, AFib on warfarin, dCHF, CKD4. Admitted for  cellulitis/osteo/gout flare of R great toe; pharmacy to dose warfarin while admitted.   Baseline INR therapeutic  Prior anticoagulation: warfarin 5 mg daily except 2.5 mg on Mondays; last dose 2/21.  Significant events:  Today, 05/20/2016:  CBC: stable  INR 3.23, now supra-therapeutic on home regimen  Major drug interactions: none, broad-spectrum abx could increase INR  No bleeding issues per nursing  CHO modified diet ordered- NPO yesterday for I&D, previously eating 100%.   Scr trending up, +UOP however not measured  Goal of Therapy: INR 2-3  Plan: 1) Hold Coumadin tonight & let INR drift back to goal range 2) Daily INR  Netta Cedars, PharmD, BCPS Pager: [phone] 05/20/2016, 8:11 AM

## 2016-05-20 NOTE — Progress Notes (Signed)
   Subjective: 1 Day Post-Op Procedure(s) (LRB): MINOR IRRIGATION AND DEBRIDEMENT Right great toe (Right) Patient reports pain as mild.   Patient seen in rounds for Dr. Wynelle Link earlier this morning. Patient is well, but has had some minor complaints of pain in the foot and toe, requiring pain medications We will start therapy today.  [place] wedge shoe ordered this afternoon and placed the patient WBAT thru the heel only with the wedge shoe.   Objective: Vital signs in last 24 hours: Temp:  [98.1 F (36.7 C)-99.9 F (37.7 C)] 99.9 F (37.7 C) (02/27 2047) Pulse Rate:  [54-66] 66 (02/27 2047) Resp:  [16-18] 18 (02/27 2047) BP: (132-151)/(57-60) 139/60 (02/27 2047) SpO2:  [95 %-100 %] 95 % (02/27 2047)  Intake/Output from previous day: 02/26 0701 - 02/27 0700 In: 2591.3 [P.O.:410; I.V.:1981.3; IV Piggyback:200] Out: 155 [Urine:150; Blood:5] Intake/Output this shift: No intake/output data recorded.   Recent Labs  05/19/16 0521 05/20/16 0501  HGB 12.2 11.5*    Recent Labs  05/19/16 0521 05/20/16 0501  WBC 10.8* 13.3*  RBC 4.56 4.33  HCT 36.7 35.4*  PLT 275 246    Recent Labs  05/18/16 0524 05/19/16 0521 05/20/16 0501  NA 139  --  137  K 3.6  --  3.9  CL 102  --  106  CO2 29  --  25  BUN 23*  --  22*  CREATININE 1.63* 1.70* 1.71*  GLUCOSE 156*  --  131*  CALCIUM 9.3  --  8.5*    Recent Labs  05/19/16 0521 05/20/16 0501  INR 2.61 3.23    EXAM General - Patient is Alert and Appropriate Extremity - Sensation intact distally Dressing - dressing C/D/I Motor Function - intact, moving toes well on exam.   Past Medical History:  Diagnosis Date  . Diabetes mellitus without complication ([place])   . Fall at home Sept. 3, 2016   Fx  3 ribs  . Hypertension   . Ileus, postoperative ([place]) 12/17/2012  . Irregular heart beat   . Renal disorder     Assessment/Plan: 1 Day Post-Op Procedure(s) (LRB): MINOR IRRIGATION AND DEBRIDEMENT Right great toe  (Right) Principal Problem:   Cellulitis of great toe of right foot Active Problems:   HTN (hypertension)   Dyslipidemia   Atrial fibrillation (HCC)   Chronic kidney disease, stage IV (severe) (HCC)   Hyperlipidemia   GERD (gastroesophageal reflux disease)   Diabetes mellitus with renal complications (HCC)   Chronic diastolic heart failure (HCC)  Estimated body mass index is 30.71 kg/m as calculated from the following:   Height as of this encounter: 5\' 6"  (1.676 m).   Weight as of this encounter: 86.3 kg (190 lb 4.1 oz). Up with therapy   Weight-Bearing as tolerated to right heel only, no weight on the toes  G+ cocci noted on gram stain  Arlee Muslim, PA-C Orthopaedic Surgery 05/20/2016, 10:32 PM

## 2016-05-20 NOTE — Progress Notes (Addendum)
PROGRESS NOTE  [name]  NLZ:[id number] DOB: 08/20/1933  DOA: 05/15/2016 PCP: Aretta Nip, MD   Brief Narrative:  83 y.o.? 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 fib on Coumadin,  chronic diastolic CHF,  stage IV chronic kidney disease  presented to ED on 05/15/16 due to worsening pain, swelling, redness and drainage from right great toe.  She gives history of eruptions from tophaceous deposits in the past in her fingers and elbow.  Admitted for cellulitis complicating tophaceous gout Vs abscess & osteomyelitis.  Initially treated as cellulitis complicating tophaceous gout but subsequently concerning for abscess.   MRI of right foot 2/25 confirmed abscess and possible osteomyelitis. Orthopedics consulting and plan I&D on 2/26.   Assessment & Plan:   Principal Problem:   Cellulitis of great toe of right foot Active Problems:   HTN (hypertension)   Dyslipidemia   Atrial fibrillation (HCC)   Chronic kidney disease, stage IV (severe) (HCC)   Hyperlipidemia   GERD (gastroesophageal reflux disease)   Diabetes mellitus with renal complications (HCC)   Chronic diastolic heart failure ([place])    1. Cellulitis of the right great toe complicating tophaceous gout Vs Abscess & osteomyelitis:  initially felt to have cellulitis, possible abscess and osteomyelitis.  Orthopedics was consulted and initially recommended nonoperative management with foot elevation and IV antibiotics. Continued empiric IV vancomycin and Zosyn.  area over the dorsum of her right great toe which seemed fluctuant and concerning for abscess- MRI right foot performed on 2/25 which shows features consistent with abscess. Performed incision and drainage on 05/19/16.  Continue IV Antibiotics and probably will need 4 weeks total of PO abx.  From my persepctive is ok with only TTWB until seen and reviewed by Dr.  Maureen Ralphs for post-op recs 2. Uncontrolled type II DM with renal complications: Held Victoza. Placed on NovoLog SSI. Monitor closely and depending on CBGs may consider Lantus. Reasonable inpatient control. Blood sugars ranging 126 212 3. Essential hypertension: Mildly uncontrolled. Continue amlodipine 5, metoprolol 50 twice a day-reasonably controlled 4. Chronic diastolic CHF: She seems to have chronic lower extremity edema but otherwise appears compensated. Continue Lasix 80 mg daily and when necessary metolazone when discharged 5. Stage IV chronic kidney disease: Creatinine appears at baseline. 6. Gout:  management as per problem #1. Consider outpatient rheumatology consultation for management of tophaceous gout. 7. Hyperlipidemia: Continue statins. 8. Paroxysmal A. fib: Controlled ventricular rate. Continue amiodarone 100 daily, toprol 50 bid  and Coumadin. INR therapeutic 3.2 9. GERD: PPI. 10. Anemia: Stable. 11. Hypokalemia: Replaced.   DVT prophylaxis: Anticoagulated on Coumadin  Code Status: Full Family Communication: Discussed with patient's spouse at bedside.  Disposition Plan: DC home when medically improved. Discharge date not clear at this time. Await therapy evaluation   Consultants:   Orthopedics  Procedures:   None  Antimicrobials:   IV vancomycin 2/22 >  IV Zosyn 2/22 >    Subjective:  Fair no new issues Eating and drinking without any problem Not requiring oxygen supplemental  Objective:  Vitals:   05/19/16 2105 05/20/16 0139 05/20/16 0509 05/20/16 0540  BP: (!) 154/53  132/60   Pulse: (!) 57 (!) 55 (!) 54 (!) 57  Resp: 17 18 16    Temp: 98.3 F (36.8 C)  99.6 F (37.6 C)   TempSrc: Oral  Oral   SpO2: 100% 100% 100% 100%  Weight:      Height:        Intake/Output Summary (Last 24 hours) at 05/20/16 1120 Last data filed at 05/20/16 0640  Gross per 24 hour  Intake          2591.25 ml  Output              155 ml  Net          2436.25 ml    Filed Weights   05/15/16 1907 05/18/16 0909 05/19/16 0425  Weight: 83.5 kg (184 lb 1.4 oz) 84 kg (185 lb 3 oz) 86.3 kg (190 lb 4.1 oz)    Examination:  General exam: Pleasant elderly female lying comfortably propped up in bed. Respiratory system: Clear to auscultation. Respiratory effort normal. Cardiovascular system: S1 & S2 heard, RRR. No JVD, murmurs, rubs, gallops or clicks. No pedal edema. Gastrointestinal system: Abdomen is nondistended, soft and nontender. No organomegaly or masses felt. Normal bowel sounds heard. Central nervous system: Alert and oriented. No focal neurological deficits. Extremities: Symmetric 5 x 5 power. Skin: Left lower extremity below mid legis bandaged and not examined Psychiatry: Judgement and insight appear normal. Mood & affect appropriate.     Data Reviewed: I have personally reviewed following labs and imaging studies  CBC:  Recent Labs Lab 05/15/16 1556 05/16/16 0542 05/17/16 0543 05/19/16 0521 05/20/16 0501  WBC 12.7* 11.6* 11.0* 10.8* 13.3*  NEUTROABS 9.3*  --   --   --   --   HGB 12.1 11.3* 11.3* 12.2 11.5*  HCT 36.8 33.4* 34.8* 36.7 35.4*  MCV 81.1 80.7 80.7 80.5 81.8  PLT 254 232 225 275 161   Basic Metabolic Panel:  Recent Labs Lab 05/15/16 1556 05/16/16 0542 05/17/16 0543 05/18/16 0524 05/19/16 0521 05/20/16 0501  NA 139 139 140 139  --  137  K 3.6 3.7 3.4* 3.6  --  3.9  CL 106 107 107 102  --  106  CO2 25 25 27 29   --  25  GLUCOSE 141* 138* 141* 156*  --  131*  BUN 32* 30* 25* 23*  --  22*  CREATININE 1.74* 1.61* 1.72* 1.63* 1.70* 1.71*  CALCIUM 8.9 8.5* 8.9 9.3  --  8.5*   GFR: Estimated Creatinine Clearance: 29 mL/min (by C-G formula based on SCr of 1.71 mg/dL (H)). Liver Function Tests:  Recent Labs Lab 05/15/16 1556  AST 19  ALT 18  ALKPHOS 51  BILITOT 0.5  PROT 7.0  ALBUMIN 3.1*   No results for input(s): LIPASE, AMYLASE in the last 168 hours. No results for input(s): AMMONIA in the last 168  hours. Coagulation Profile:  Recent Labs Lab 05/16/16 1035 05/17/16 0543 05/18/16 0524 05/19/16 0521 05/20/16 0501  INR 2.54 2.69 2.62 2.61 3.23   Cardiac Enzymes: No results for input(s): CKTOTAL, CKMB, CKMBINDEX, TROPONINI in the last 168 hours. BNP (last 3 results) No results for input(s): PROBNP in the last 8760 hours. HbA1C: No results for input(s): HGBA1C in the last 72 hours. CBG:  Recent Labs Lab 05/19/16 1150 05/19/16 1442 05/19/16 1627 05/19/16 2104 05/20/16 0740  GLUCAP 130* 85 88 212* 126*   Lipid Profile: No results for input(s): CHOL, HDL, LDLCALC, TRIG, CHOLHDL, LDLDIRECT in the last 72 hours. Thyroid Function Tests: No results for input(s): TSH, T4TOTAL, FREET4, T3FREE, THYROIDAB in the last 72 hours. Anemia Panel: No results for input(s): VITAMINB12, FOLATE, FERRITIN, TIBC, IRON, RETICCTPCT in the last 72 hours.  Sepsis Labs:  Recent Labs Lab  05/15/16 1610 05/16/16 0542 05/17/16 0543 05/19/16 0521 05/20/16 0501  WBC  --  11.6* 11.0* 10.8* 13.3*  LATICACIDVEN 1.12  --   --   --   --     Recent Results (from the past 240 hour(s))  MRSA PCR Screening     Status: None   Collection Time: 05/19/16  8:06 AM  Result Value Ref Range Status   MRSA by PCR NEGATIVE NEGATIVE Final    Comment:        The GeneXpert MRSA Assay (FDA approved for NASAL specimens only), is one component of a comprehensive MRSA colonization surveillance program. It is not intended to diagnose MRSA infection nor to guide or monitor treatment for MRSA infections.   Aerobic/Anaerobic Culture (surgical/deep wound)     Status: None (Preliminary result)   Collection Time: 05/19/16  1:55 PM  Result Value Ref Range Status   Specimen Description TOE RIGHT GREAT  Final   Special Requests NONE  Final   Gram Stain   Final    FEW WBC PRESENT, PREDOMINANTLY PMN FEW GRAM POSITIVE COCCI IN CLUSTERS Gram Stain Report Called to,Read Back By and Verified With: DR. Maureen Ralphs [id number]  A.QUIZON    Culture PENDING  Incomplete   Report Status PENDING  Incomplete         Radiology Studies: Mr Foot Right Wo Contrast  Result Date: 05/18/2016 CLINICAL DATA:  Ulceration and cellulitis of the right great toe in a diabetic patient. The patient first noticed changes in the toe approximately 1 week ago. EXAM: MRI OF THE RIGHT FOREFOOT WITHOUT CONTRAST TECHNIQUE: Multiplanar, multisequence MR imaging of the right foot was performed. No intravenous contrast was administered. COMPARISON:  Plain films of the right foot 05/15/2016. FINDINGS: Bones/Joint/Cartilage Marked marrow edema is seen throughout the proximal and distal phalanges of the great toe, worst in the distal phalanx. Patchy marrow edema extends into the first metatarsal to the level of the proximal metaphysis. Edema in the first metatarsal is most intense in its distal 2.5 cm. No fracture is identified. Ligaments Intact. Muscles and Tendons Intact. Soft tissues A focal fluid collection extends from the dorsal aspect of the IP joint of the great toe to the level the first MTP joint. It measures 3.3 cm long by approximately 2.1 cm transverse by 1 cm craniocaudal. At the level of the neck of the proximal phalanx, the collection is circum pharyngeal about the proximal phalanx extending in the plantar soft tissues at the base of the distal phalanx. Tiny focus of susceptibility artifact in the cutaneous tissues thumb distal phalanx is compatible with the presence of a foreign body, possibly microscopic. Skin ulceration along the medial aspect of the tuft is identified. There is some soft tissue swelling over the dorsum of the foot. IMPRESSION: Skin ulceration with a large abscess extending the length of the proximal phalanx of the great toe over its dorsal margin. The abscess is circumferential about the head and neck of the proximal phalanx of the great toe and extends in the plantar soft tissues of the toe along the distal phalanx.  Osteomyelitis throughout the proximal and distal phalanges of the great toe. Marrow signal abnormality in the distal 2.5 cm of the first metatarsal is consistent with osteomyelitis. Milder degree of marrow edema extends in the first metatarsal to the proximal metaphysis. This signal change could be reactive but is worrisome for osteomyelitis. Electronically Signed   By: Inge Rise M.D.   On: 05/18/2016 13:43  Scheduled Meds: . acidophilus  1 capsule Oral QPM  . amiodarone  100 mg Oral Daily  . amLODipine  5 mg Oral Q1200  . atorvastatin  40 mg Oral QPM  . colchicine  0.6 mg Oral BID  . ferrous sulfate  325 mg Oral Q breakfast  . furosemide  40 mg Oral q1800  . furosemide  80 mg Oral Q breakfast  . insulin aspart  0-9 Units Subcutaneous TID AC & HS  . metoprolol  50 mg Oral BID  . multivitamin with minerals  1 tablet Oral Daily  . pantoprazole  40 mg Oral QPM  . piperacillin-tazobactam (ZOSYN)  IV  3.375 g Intravenous Q8H  . potassium chloride  30 mEq Oral BID  . sodium chloride flush  3 mL Intravenous Q12H  . vancomycin  1,000 mg Intravenous Q24H  . Warfarin - Pharmacist Dosing Inpatient   Does not apply q1800   Continuous Infusions: . sodium chloride 75 mL/hr at 05/20/16 0640     LOS: 5 days      Nita Sells, MD [facility] Pager [phone]  If 7PM-7AM, please contact night-coverage [email or web address] Password [facility] 05/20/2016, 11:20 AM

## 2016-05-20 NOTE — Progress Notes (Signed)
PT Cancellation Note  Patient Details Name: [name] MRN: [id number] DOB: 1933/11/05   Cancelled Treatment:    Reason Eval/Treat Not Completed: Order received. Awaiting recommendations from Ortho (WB status, need for post-op shoe) before peforming PT eval. Hospitialist has recommended TTWB until Ortho follows up. Will await Ortho recs concerning WB status as this will affect pt's mobility status (i.e TTWB vs WBAT). Spoke with RN who has contacted [employer] in an attempt to speak with PA. Will plan to see pt in a.m. pending ortho recs. Thanks.    Weston Anna, MPT Pager: [phone]

## 2016-05-21 DIAGNOSIS — L8990 Pressure ulcer of unspecified site, unspecified stage: Secondary | ICD-10-CM

## 2016-05-21 HISTORY — DX: Pressure ulcer of unspecified site, unspecified stage: L89.90

## 2016-05-21 LAB — PROTIME-INR
INR: 3.08
PROTHROMBIN TIME: 32.5 s — AB (ref 11.4–15.2)

## 2016-05-21 LAB — COMPREHENSIVE METABOLIC PANEL
ALT: 19 U/L (ref 14–54)
ANION GAP: 6 (ref 5–15)
AST: 20 U/L (ref 15–41)
Albumin: 2.4 g/dL — ABNORMAL LOW (ref 3.5–5.0)
Alkaline Phosphatase: 42 U/L (ref 38–126)
BILIRUBIN TOTAL: 0.5 mg/dL (ref 0.3–1.2)
BUN: 23 mg/dL — ABNORMAL HIGH (ref 6–20)
CO2: 26 mmol/L (ref 22–32)
Calcium: 8.6 mg/dL — ABNORMAL LOW (ref 8.9–10.3)
Chloride: 110 mmol/L (ref 101–111)
Creatinine, Ser: 2.01 mg/dL — ABNORMAL HIGH (ref 0.44–1.00)
GFR calc non Af Amer: 22 mL/min — ABNORMAL LOW (ref >60)
GFR, EST AFRICAN AMERICAN: 26 mL/min — AB (ref >60)
GLUCOSE: 124 mg/dL — AB (ref 65–99)
POTASSIUM: 3.6 mmol/L (ref 3.5–5.1)
Sodium: 142 mmol/L (ref 135–145)
TOTAL PROTEIN: 6.1 g/dL — AB (ref 6.5–8.1)

## 2016-05-21 LAB — CBC
HEMATOCRIT: 35.5 % — AB (ref 36.0–46.0)
Hemoglobin: 11.3 g/dL — ABNORMAL LOW (ref 12.0–15.0)
MCH: 25.9 pg — ABNORMAL LOW (ref 26.0–34.0)
MCHC: 31.8 g/dL (ref 30.0–36.0)
MCV: 81.4 fL (ref 78.0–100.0)
Platelets: 244 10*3/uL (ref 150–400)
RBC: 4.36 MIL/uL (ref 3.87–5.11)
RDW: 13.6 % (ref 11.5–15.5)
WBC: 13.2 10*3/uL — ABNORMAL HIGH (ref 4.0–10.5)

## 2016-05-21 LAB — GLUCOSE, CAPILLARY
GLUCOSE-CAPILLARY: 122 mg/dL — AB (ref 65–99)
GLUCOSE-CAPILLARY: 195 mg/dL — AB (ref 65–99)
GLUCOSE-CAPILLARY: 207 mg/dL — AB (ref 65–99)
Glucose-Capillary: 146 mg/dL — ABNORMAL HIGH (ref 65–99)

## 2016-05-21 MED ORDER — CEPHALEXIN 500 MG PO CAPS
500.0000 mg | ORAL_CAPSULE | Freq: Three times a day (TID) | ORAL | Status: DC
  Administered 2016-05-21 – 2016-05-22 (×2): 500 mg via ORAL
  Filled 2016-05-21 (×2): qty 1

## 2016-05-21 MED ORDER — CEFAZOLIN SODIUM-DEXTROSE 2-4 GM/100ML-% IV SOLN
2.0000 g | Freq: Two times a day (BID) | INTRAVENOUS | Status: DC
  Administered 2016-05-21: 2 g via INTRAVENOUS
  Filled 2016-05-21: qty 100

## 2016-05-21 MED ORDER — WARFARIN SODIUM 2.5 MG PO TABS
2.5000 mg | ORAL_TABLET | Freq: Once | ORAL | Status: AC
  Administered 2016-05-21: 2.5 mg via ORAL
  Filled 2016-05-21: qty 1

## 2016-05-21 NOTE — Progress Notes (Addendum)
Pharmacy Antibiotic Note  [name] is a 83 y.o. female admitted on 05/15/2016 with cellulitis/abscess and possible osteomyelitis of R great toe. Purulent drainage noted. S/P I&D on 2/26.  Currently on day#7 antibiotics.  Pharmacy was initially consulted for Vancomycin and Zosyn dosing. Vancomycin was stopped 2/27.  Cx growing staph aureus however -sensitivities pending.   05/21/2016:  Afebrile  Mild leukocytosis (13.2)  Worsening renal function (Scr 2.01, CrCl~80ml/min)  Plan:  Continue Zosyn 3.375gm IV Q8h to be infused over 4hrs (for CrCl>23ml/min)  Monitor renal function and cx data   F/U wound cx sensitivities to ensure patient on appropriate antibiotics  Height: 5\' 6"  (167.6 cm) Weight: 190 lb 4.1 oz (86.3 kg) IBW/kg (Calculated) : 59.3  Temp (24hrs), Avg:99 F (37.2 C), Min:98.1 F (36.7 C), Max:99.9 F (37.7 C)   Recent Labs Lab 05/15/16 1610 05/16/16 0542 05/17/16 0543 05/18/16 0524 05/18/16 1701 05/19/16 0521 05/20/16 0501 05/21/16 0526  WBC  --  11.6* 11.0*  --   --  10.8* 13.3* 13.2*  CREATININE  --  1.61* 1.72* 1.63*  --  1.70* 1.71* 2.01*  LATICACIDVEN 1.12  --   --   --   --   --   --   --   VANCOTROUGH  --   --   --   --  18  --   --   --     Estimated Creatinine Clearance: 24.7 mL/min (by C-G formula based on SCr of 2.01 mg/dL (H)).    Allergies  Allergen Reactions  . Lotensin [Benazepril Hcl] Anaphylaxis  . Zantac [Ranitidine Hcl] Anaphylaxis  . Tape Itching and Rash   Antimicrobials this admission: Vanc 2/22 >> 2/27 Zosyn 2/22 >> 2/28 Ancef 2/28 >>  Dose adjustments this admission: 1700 VT = 18 mcg/ml on 1gm q24, prior to 3rd dose  Microbiology results: Microbiology results: 2/26 Wound Cx (R great toe): SA- sens pending 2/26 MRSA: negative  Thank you for allowing pharmacy to be a part of this patient's care.  Netta Cedars, PharmD, BCPS Pager: [phone] 05/21/2016, 7:39 AM   Addendum: Pharmacy now consulted to  narrow Zosyn to Ancef for SA growing in wound cx.   *Note patient has previously been on chronic suppressive therapy with cephalexin 250mg  PO daily   Ancef 2gm IV q12h (adjusted for CrCl<33ml/min)  F/U cx data, renal function   Netta Cedars, PharmD, BCPS Pager: [phone] 05/21/2016@8 :29 AM

## 2016-05-21 NOTE — Progress Notes (Signed)
Subjective: 2 Days Post-Op Procedure(s) (LRB): MINOR IRRIGATION AND DEBRIDEMENT Right great toe (Right) Patient reports pain as mild.    Objective: Vital signs in last 24 hours: Temp:  [98.1 F (36.7 C)-99.9 F (37.7 C)] 98.9 F (37.2 C) (02/28 0413) Pulse Rate:  [58-66] 58 (02/28 0413) Resp:  [18] 18 (02/28 0413) BP: (139-151)/(57-60) 150/60 (02/28 0413) SpO2:  [95 %-98 %] 97 % (02/28 0413)  Intake/Output from previous day: 02/27 0701 - 02/28 0700 In: 1347.5 [P.O.:1200; I.V.:97.5; IV Piggyback:50] Out: -  Intake/Output this shift: No intake/output data recorded.   Recent Labs  05/19/16 0521 05/20/16 0501 05/21/16 0526  HGB 12.2 11.5* 11.3*    Recent Labs  05/20/16 0501 05/21/16 0526  WBC 13.3* 13.2*  RBC 4.33 4.36  HCT 35.4* 35.5*  PLT 246 244    Recent Labs  05/20/16 0501 05/21/16 0526  NA 137 142  K 3.9 3.6  CL 106 110  CO2 25 26  BUN 22* 23*  CREATININE 1.71* 2.01*  GLUCOSE 131* 124*  CALCIUM 8.5* 8.6*    Recent Labs  05/20/16 0501 05/21/16 0526  INR 3.23 3.08    No cellulitis present Compartment soft Dressing changed. Swelling decreased dramatically. No drainage. Overall appearance of toe greatly improved from pre-op  Assessment/Plan: 2 Days Post-Op Procedure(s) (LRB): MINOR IRRIGATION AND DEBRIDEMENT Right great toe (Right) Await cultures. Continue IV antibiotics today as WBC still elevated.  [name] 05/21/2016, 9:20 AM

## 2016-05-21 NOTE — Progress Notes (Signed)
PROGRESS NOTE  [name]  GEX:[id number] DOB: January 21, 1934  DOA: 05/15/2016 PCP: Aretta Nip, MD   Brief Narrative:  83 y.o.? 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 fib on Coumadin,  chronic diastolic CHF,  stage IV chronic kidney disease  presented to ED on 05/15/16 due to worsening pain, swelling, redness and drainage from right great toe.  She gives history of eruptions from tophaceous deposits in the past in her fingers and elbow.  Admitted for cellulitis complicating tophaceous gout Vs abscess & osteomyelitis.  Initially treated as cellulitis complicating tophaceous gout but subsequently concerning for abscess.   MRI of right foot 2/25 confirmed abscess and possible osteomyelitis. Orthopedics consulting did amputation and  I&D on 2/26.   Assessment & Plan:   Principal Problem:   Cellulitis of great toe of right foot Active Problems:   HTN (hypertension)   Dyslipidemia   Atrial fibrillation (HCC)   Chronic kidney disease, stage IV (severe) (HCC)   Hyperlipidemia   GERD (gastroesophageal reflux disease)   Diabetes mellitus with renal complications (HCC)   Chronic diastolic heart failure (HCC)   Pressure injury of skin    1. Cellulitis of the right great toe complicating tophaceous gout Vs Abscess & osteomyelitis:  initially felt to have cellulitis, possible abscess and osteomyelitis.  Orthopedics was consulted and initially recommended nonoperative management with foot elevation and IV antibiotics. Changed IV vancomycin and Zosyn-->Cefazolin-->kelfex 500 tid after d/w Dr. Megan Salon ID on 05/21/16--Ortho to please determine duration of abx  Performed incision and drainage on 05/19/16.  Continue IV Antibiotics and probably will need 4 weeks total of PO abx.  Home health on d/c with boot 2. Uncontrolled type II DM with renal complications: Held Victoza. Placed on  NovoLog SSI. Monitor closely and depending on CBGs may consider Lantus. Reasonable inpatient control. Blood sugars ranging 195--146 3. Essential hypertension: Mildly uncontrolled. Continue amlodipine 5, metoprolol 50 twice a day-reasonably controlled 4. Chronic diastolic CHF: She seems to have chronic lower extremity edema but otherwise appears compensated. Continue Lasix 80 mg daily and when necessary metolazone when discharged 5. Stage IV chronic kidney disease: Creatinine appears slightlt y elevated--forcing oral fluids.  Labs am 6. Gout:  management as per problem #1. Consider outpatient rheumatology consultation for management of tophaceous gout. 7. Hyperlipidemia: Continue statins. 8. Paroxysmal A. fib: Controlled ventricular rate. Continue amiodarone 100 daily, toprol 50 bid  and Coumadin. INR therapeutic 3.2 9. GERD: PPI. 10. Anemia: Stable. 11. Hypokalemia: Replaced.   DVT prophylaxis: Anticoagulated on Coumadin  Code Status: Full Family Communication: Discussed with patient's spouse at bedside.  Disposition Plan: DC home when medically improved-probably as early as 2/29/18   Consultants:   Orthopedics  Procedures:   None  Antimicrobials:   IV vancomycin 2/22 >  IV Zosyn 2/22 >    Subjective:  Well. No fever chill n/v/cp  Objective:  Vitals:   05/20/16 1359 05/20/16 2047 05/21/16 0413 05/21/16 1122  BP: (!) 151/57 139/60 (!) 150/60 (!) 154/61  Pulse: 64 66 (!) 58   Resp: 18 18 18    Temp: 98.1 F (36.7 C) 99.9 F (37.7 C) 98.9 F (37.2 C)   TempSrc: Oral Oral Oral   SpO2: 98% 95% 97%   Weight:      Height:        Intake/Output Summary (Last 24 hours) at 05/21/16 1623 Last data filed  at 05/21/16 0930  Gross per 24 hour  Intake           1057.5 ml  Output                0 ml  Net           1057.5 ml   Filed Weights   05/15/16 1907 05/18/16 0909 05/19/16 0425  Weight: 83.5 kg (184 lb 1.4 oz) 84 kg (185 lb 3 oz) 86.3 kg (190 lb 4.1 oz)     Examination:  General exam: Pleasant elderly female lying comfortably propped up in bed. Respiratory system: Clear to auscultation. Respiratory effort normal. Cardiovascular system: S1 & S2 heard, RRR. No JVD, murmurs, rubs, gallops or clicks. No pedal edema. Gastrointestinal system: Abdomen is nondistended, soft and nontender. No organomegaly or masses felt. Normal bowel sounds heard. Central nervous system: Alert and oriented. No focal neurological deficits. Extremities: Symmetric 5 x 5 power. Skin: Left lower extremity below mid legis bandaged and not examined Psychiatry: Judgement and insight appear normal. Mood & affect appropriate.     Data Reviewed: I have personally reviewed following labs and imaging studies  CBC:  Recent Labs Lab 05/15/16 1556 05/16/16 0542 05/17/16 0543 05/19/16 0521 05/20/16 0501 05/21/16 0526  WBC 12.7* 11.6* 11.0* 10.8* 13.3* 13.2*  NEUTROABS 9.3*  --   --   --   --   --   HGB 12.1 11.3* 11.3* 12.2 11.5* 11.3*  HCT 36.8 33.4* 34.8* 36.7 35.4* 35.5*  MCV 81.1 80.7 80.7 80.5 81.8 81.4  PLT 254 232 225 275 246 850   Basic Metabolic Panel:  Recent Labs Lab 05/16/16 0542 05/17/16 0543 05/18/16 0524 05/19/16 0521 05/20/16 0501 05/21/16 0526  NA 139 140 139  --  137 142  K 3.7 3.4* 3.6  --  3.9 3.6  CL 107 107 102  --  106 110  CO2 25 27 29   --  25 26  GLUCOSE 138* 141* 156*  --  131* 124*  BUN 30* 25* 23*  --  22* 23*  CREATININE 1.61* 1.72* 1.63* 1.70* 1.71* 2.01*  CALCIUM 8.5* 8.9 9.3  --  8.5* 8.6*   GFR: Estimated Creatinine Clearance: 24.7 mL/min (by C-G formula based on SCr of 2.01 mg/dL (H)). Liver Function Tests:  Recent Labs Lab 05/15/16 1556 05/21/16 0526  AST 19 20  ALT 18 19  ALKPHOS 51 42  BILITOT 0.5 0.5  PROT 7.0 6.1*  ALBUMIN 3.1* 2.4*   No results for input(s): LIPASE, AMYLASE in the last 168 hours. No results for input(s): AMMONIA in the last 168 hours. Coagulation Profile:  Recent Labs Lab  05/17/16 0543 05/18/16 0524 05/19/16 0521 05/20/16 0501 05/21/16 0526  INR 2.69 2.62 2.61 3.23 3.08   Cardiac Enzymes: No results for input(s): CKTOTAL, CKMB, CKMBINDEX, TROPONINI in the last 168 hours. BNP (last 3 results) No results for input(s): PROBNP in the last 8760 hours. HbA1C: No results for input(s): HGBA1C in the last 72 hours. CBG:  Recent Labs Lab 05/20/16 1227 05/20/16 1718 05/20/16 2046 05/21/16 0737 05/21/16 1129  GLUCAP 137* 124* 200* 122* 195*   Lipid Profile: No results for input(s): CHOL, HDL, LDLCALC, TRIG, CHOLHDL, LDLDIRECT in the last 72 hours. Thyroid Function Tests: No results for input(s): TSH, T4TOTAL, FREET4, T3FREE, THYROIDAB in the last 72 hours. Anemia Panel: No results for input(s): VITAMINB12, FOLATE, FERRITIN, TIBC, IRON, RETICCTPCT in the last 72 hours.  Sepsis Labs:  Recent Labs Lab 05/15/16 1610  05/17/16 0543 05/19/16 0521 05/20/16 0501 05/21/16 0526  WBC  --   < > 11.0* 10.8* 13.3* 13.2*  LATICACIDVEN 1.12  --   --   --   --   --   < > = values in this interval not displayed.  Recent Results (from the past 240 hour(s))  MRSA PCR Screening     Status: None   Collection Time: 05/19/16  8:06 AM  Result Value Ref Range Status   MRSA by PCR NEGATIVE NEGATIVE Final    Comment:        The GeneXpert MRSA Assay (FDA approved for NASAL specimens only), is one component of a comprehensive MRSA colonization surveillance program. It is not intended to diagnose MRSA infection nor to guide or monitor treatment for MRSA infections.   Aerobic/Anaerobic Culture (surgical/deep wound)     Status: None (Preliminary result)   Collection Time: 05/19/16  1:55 PM  Result Value Ref Range Status   Specimen Description TOE RIGHT GREAT  Final   Special Requests NONE  Final   Gram Stain   Final    FEW WBC PRESENT, PREDOMINANTLY PMN FEW GRAM POSITIVE COCCI IN CLUSTERS Gram Stain Report Called to,Read Back By and Verified With: DR. Maureen Ralphs  [phone] [email or web address]    Culture   Final    MODERATE STAPHYLOCOCCUS AUREUS NO ANAEROBES ISOLATED; CULTURE IN PROGRESS FOR 5 DAYS    Report Status PENDING  Incomplete   Organism ID, Bacteria STAPHYLOCOCCUS AUREUS  Final      Susceptibility   Staphylococcus aureus - MIC*    CIPROFLOXACIN <=0.5 SENSITIVE Sensitive     ERYTHROMYCIN <=0.25 SENSITIVE Sensitive     GENTAMICIN <=0.5 SENSITIVE Sensitive     OXACILLIN 0.5 SENSITIVE Sensitive     TETRACYCLINE <=1 SENSITIVE Sensitive     VANCOMYCIN 1 SENSITIVE Sensitive     TRIMETH/SULFA <=10 SENSITIVE Sensitive     CLINDAMYCIN <=0.25 SENSITIVE Sensitive     RIFAMPIN <=0.5 SENSITIVE Sensitive     Inducible Clindamycin NEGATIVE Sensitive     * MODERATE STAPHYLOCOCCUS AUREUS         Radiology Studies: No results found.      Scheduled Meds: . acidophilus  1 capsule Oral QPM  . amiodarone  100 mg Oral Daily  . amLODipine  5 mg Oral Q1200  . atorvastatin  40 mg Oral QPM  .  ceFAZolin (ANCEF) IV  2 g Intravenous Q12H  . colchicine  0.6 mg Oral BID  . ferrous sulfate  325 mg Oral Q breakfast  . furosemide  40 mg Oral q1800  . furosemide  80 mg Oral Q breakfast  . insulin aspart  0-9 Units Subcutaneous TID AC & HS  . metoprolol  50 mg Oral BID  . multivitamin with minerals  1 tablet Oral Daily  . pantoprazole  40 mg Oral QPM  . potassium chloride  30 mEq Oral BID  . sodium chloride flush  3 mL Intravenous Q12H  . warfarin  2.5 mg Oral ONCE-1800  . Warfarin - Pharmacist Dosing Inpatient   Does not apply q1800   Continuous Infusions: . sodium chloride 75 mL/hr at 05/20/16 0640     LOS: 6 days      Nita Sells, MD [facility] Pager [phone]  If 7PM-7AM, please contact night-coverage [email or web address] Password [facility] 05/21/2016, 4:23 PM

## 2016-05-21 NOTE — Evaluation (Addendum)
Physical Therapy Evaluation Patient Details Name: [name] MRN: [id number] DOB: 03/19/1934 Today's Date: 05/21/2016   History of Present Illness  Pt is an 83 year old female with PMH of type II DM with renal complications/IDDM, HTN, HLD, gout, A. fib on Coumadin, chronic diastolic CHF, stage IV chronic kidney disease, presented to ED on 05/15/16 due to worsening pain, swelling, redness and drainage from right great toe. History of eruptions from tophaceous deposits in the past in her fingers and elbow. Admitted for cellulitis complicating tophaceous gout. s/p I&D R great toe 05/19/16.   Clinical Impression  Pt admitted with above diagnosis. Pt currently with functional limitations due to the deficits listed below (see PT Problem List). Pt ambulated 35' with RW and R darco wedge shoe with supervision. PT recommending supervision for mobility for several days as pt adjusts to darco shoe and weightbearing thru R heel.  She reports her husband, sister, and daughter will be able to provide supervision at home.  Pt was instructed in AROM of R ankle and knee exercises to be done independently.  Pt will benefit from skilled PT to increase their independence and safety with mobility to allow discharge to the venue listed below.       Follow Up Recommendations Home health PT; supervision for mobility    Equipment Recommendations  None recommended by PT    Recommendations for Other Services       Precautions / Restrictions Precautions Precautions: Fall Precaution Comments: no falls in past 1 year, fell over a year ago and sustained rib fxs while trying to swat a bug Required Braces or Orthoses: Other Brace/Splint Other Brace/Splint: darco wedge shoe Restrictions Weight Bearing Restrictions: Yes RLE Weight Bearing: Weight bearing as tolerated Other Position/Activity Restrictions: WBAT thru R heel with darco wedge shoe      Mobility  Bed Mobility Overal bed mobility: Modified  Independent Bed Mobility: Supine to Sit     Supine to sit: HOB elevated     General bed mobility comments: used rail, HOB up 35*  Transfers Overall transfer level: Needs assistance Equipment used: Rolling walker (2 wheeled) Transfers: Sit to/from Stand Sit to Stand: Min guard;From elevated surface         General transfer comment: bed elevated, min/guard for safety, good hand placement, minimally increased time, pt initially anxious about standing on RLE for 1st time since surgery  Ambulation/Gait Ambulation/Gait assistance: Min guard;Supervision Ambulation Distance (Feet): 85 Feet Assistive device: Rolling walker (2 wheeled) Gait Pattern/deviations: Step-to pattern;Antalgic;Decreased step length - right;Decreased step length - left   Gait velocity interpretation: Below normal speed for age/gender General Gait Details: pt ambulated with R Darco wedge shoe WB on heel, no LOB, slow but steady, VCs for positioning in RW  Stairs            Wheelchair Mobility    Modified Rankin (Stroke Patients Only)       Balance Overall balance assessment: Needs assistance   Sitting balance-Leahy Scale: Good       Standing balance-Leahy Scale: Fair                               Pertinent Vitals/Pain Pain Score: 5  Pain Location: R great toe with walking Pain Descriptors / Indicators: Sore Pain Intervention(s): Limited activity within patient's tolerance;Monitored during session;Patient requesting pain meds-RN notified;Repositioned (RLE elevated with 2 pillows)    Home Living Family/patient expects to be discharged to:: Private  residence Living Arrangements: Spouse/significant other   Type of Home: House Home Access: Stairs to enter Entrance Stairs-Rails: None Entrance Stairs-Number of Steps: 1 Home Layout: One level Home Equipment: Environmental consultant - 2 wheels;Bedside commode      Prior Function Level of Independence: Independent with assistive device(s)   Gait  / Transfers Assistance Needed: uses RW  ADL's / Homemaking Assistance Needed: does cooking and light housework, doesn't drive        Hand Dominance        Extremity/Trunk Assessment   Upper Extremity Assessment Upper Extremity Assessment: Overall WFL for tasks assessed    Lower Extremity Assessment RLE Deficits / Details: able to actively PF/DF R ankle within normal range, did not do manual muscle testing on R ankle 2* pain/incision, R knee ext 4/5, sensation intact to light touch  R great toe       Communication   Communication: No difficulties  Cognition Arousal/Alertness: Awake/alert Behavior During Therapy: WFL for tasks assessed/performed Overall Cognitive Status: Within Functional Limits for tasks assessed                      General Comments      Exercises  ankle pumps, bilateral, x 10 AROM Long arc quad, R x 10 AROM   Assessment/Plan    PT Assessment Patient needs continued PT services  PT Problem List Decreased strength;Decreased activity tolerance;Pain;Decreased mobility;Decreased balance       PT Treatment Interventions DME instruction;Gait training;Stair training;Functional mobility training;Therapeutic exercise;Therapeutic activities;Balance training;Patient/family education    PT Goals (Current goals can be found in the Care Plan section)  Acute Rehab PT Goals Patient Stated Goal: to be able to walk, do light housekeeping and cooking PT Goal Formulation: With patient/family Time For Goal Achievement: 06/04/16 Potential to Achieve Goals: Good    Frequency Min 3X/week   Barriers to discharge        Co-evaluation               End of Session Equipment Utilized During Treatment: Gait belt Activity Tolerance: Patient tolerated treatment well Patient left: with call bell/phone within reach;Other (comment);with family/visitor present;in chair [facility], RN aware) Nurse Communication: Mobility status;Patient requests pain meds PT Visit  Diagnosis: Other abnormalities of gait and mobility (R26.89);Pain Pain - Right/Left: Right Pain - part of body: Ankle and joints of foot         Time: 1638-4536 PT Time Calculation (min) (ACUTE ONLY): 28 min   Charges:   PT Evaluation $PT Eval Low Complexity: 1 Procedure PT Treatments $Gait Training: 8-22 mins   PT G Codes:         Philomena Doheny 05/21/2016, 11:12 AM [phone]

## 2016-05-21 NOTE — Progress Notes (Signed)
[place] for warfarin Indication: Afib  Allergies  Allergen Reactions  . Lotensin [Benazepril Hcl] Anaphylaxis  . Zantac [Ranitidine Hcl] Anaphylaxis  . Tape Itching and Rash   Patient Measurements: Height: 5\' 6"  (167.6 cm) Weight: 190 lb 4.1 oz (86.3 kg) IBW/kg (Calculated) : 59.3  Vital Signs: Temp: 98.9 F (37.2 C) (02/28 0413) Temp Source: Oral (02/28 0413) BP: 150/60 (02/28 0413) Pulse Rate: 58 (02/28 0413)  Labs:  Recent Labs  05/19/16 0521 05/20/16 0501 05/21/16 0526  HGB 12.2 11.5* 11.3*  HCT 36.7 35.4* 35.5*  PLT 275 246 244  LABPROT 28.5* 33.7* 32.5*  INR 2.61 3.23 3.08  CREATININE 1.70* 1.71* 2.01*    Estimated Creatinine Clearance: 24.7 mL/min (by C-G formula based on SCr of 2.01 mg/dL (H)).  Medical History: Past Medical History:  Diagnosis Date  . Diabetes mellitus without complication ([place])   . Fall at home Sept. 3, 2016   Fx  3 ribs  . Hypertension   . Ileus, postoperative ([place]) 12/17/2012  . Irregular heart beat   . Renal disorder    Medications: this admission Scheduled:  . acidophilus  1 capsule Oral QPM  . amiodarone  100 mg Oral Daily  . amLODipine  5 mg Oral Q1200  . atorvastatin  40 mg Oral QPM  . colchicine  0.6 mg Oral BID  . ferrous sulfate  325 mg Oral Q breakfast  . furosemide  40 mg Oral q1800  . furosemide  80 mg Oral Q breakfast  . insulin aspart  0-9 Units Subcutaneous TID AC & HS  . metoprolol  50 mg Oral BID  . multivitamin with minerals  1 tablet Oral Daily  . pantoprazole  40 mg Oral QPM  . piperacillin-tazobactam (ZOSYN)  IV  3.375 g Intravenous Q8H  . potassium chloride  30 mEq Oral BID  . sodium chloride flush  3 mL Intravenous Q12H  . Warfarin - Pharmacist Dosing Inpatient   Does not apply q1800   Assessment: 25 yoF known to pharmacy from antibiotic dosing. PMH DMT2, HTN, HLD, gout, AFib on warfarin, dCHF, CKD4. Admitted for cellulitis/osteo/gout flare of R great toe;  pharmacy to dose warfarin while admitted.   Baseline INR therapeutic  Prior anticoagulation: warfarin 5 mg daily except 2.5 mg on Mondays; last dose 2/21.  Significant events:  Today, 05/21/2016:  CBC: stable  INR 3.08, just above upper end of goal range, trending down after dose held 2/27.  Major drug interactions: none, broad-spectrum abx could increase INR  No bleeding issues per nursing  CHO modified diet ordered- eating 100%.   Scr trending up, +UOP however not measured  Goal of Therapy: INR 2-3  Plan: 1) Resume Coumadin 2.5mg  tonight (1/2 home dose) 2) Daily INR  Netta Cedars, PharmD, BCPS Pager: [phone] 05/21/2016, 7:36 AM

## 2016-05-22 LAB — CBC
HCT: 34.1 % — ABNORMAL LOW (ref 36.0–46.0)
Hemoglobin: 11.1 g/dL — ABNORMAL LOW (ref 12.0–15.0)
MCH: 26.5 pg (ref 26.0–34.0)
MCHC: 32.6 g/dL (ref 30.0–36.0)
MCV: 81.4 fL (ref 78.0–100.0)
PLATELETS: 266 10*3/uL (ref 150–400)
RBC: 4.19 MIL/uL (ref 3.87–5.11)
RDW: 13.5 % (ref 11.5–15.5)
WBC: 11.8 10*3/uL — ABNORMAL HIGH (ref 4.0–10.5)

## 2016-05-22 LAB — BASIC METABOLIC PANEL
Anion gap: 7 (ref 5–15)
BUN: 19 mg/dL (ref 6–20)
CALCIUM: 8.5 mg/dL — AB (ref 8.9–10.3)
CHLORIDE: 109 mmol/L (ref 101–111)
CO2: 25 mmol/L (ref 22–32)
CREATININE: 1.56 mg/dL — AB (ref 0.44–1.00)
GFR calc non Af Amer: 30 mL/min — ABNORMAL LOW (ref >60)
GFR, EST AFRICAN AMERICAN: 35 mL/min — AB (ref >60)
GLUCOSE: 129 mg/dL — AB (ref 65–99)
Potassium: 3.3 mmol/L — ABNORMAL LOW (ref 3.5–5.1)
Sodium: 141 mmol/L (ref 135–145)

## 2016-05-22 LAB — GLUCOSE, CAPILLARY
Glucose-Capillary: 143 mg/dL — ABNORMAL HIGH (ref 65–99)
Glucose-Capillary: 184 mg/dL — ABNORMAL HIGH (ref 65–99)

## 2016-05-22 LAB — PROTIME-INR
INR: 2.31
PROTHROMBIN TIME: 25.8 s — AB (ref 11.4–15.2)

## 2016-05-22 MED ORDER — WARFARIN SODIUM 5 MG PO TABS: 5.0000 mg | ORAL_TABLET | Freq: Once | ORAL | Status: DC

## 2016-05-22 MED ORDER — HYDROCODONE-ACETAMINOPHEN 5-325 MG PO TABS: 1.0000 | ORAL_TABLET | ORAL | 0 refills | Status: DC | PRN

## 2016-05-22 MED ORDER — CIPROFLOXACIN HCL 750 MG PO TABS: 750.0000 mg | ORAL_TABLET | Freq: Two times a day (BID) | ORAL | 0 refills | Status: DC

## 2016-05-22 MED ORDER — TRAMADOL HCL 50 MG PO TABS: 50.0000 mg | ORAL_TABLET | Freq: Four times a day (QID) | ORAL | 0 refills | Status: DC | PRN

## 2016-05-22 NOTE — Discharge Instructions (Addendum)
Incision and Drainage, Care After Refer to this sheet in the next few weeks. These instructions provide you with information about caring for yourself after your procedure. Your health care provider may also give you more specific instructions. Your treatment has been planned according to current medical practices, but problems sometimes occur. Call your health care provider if you have any problems or questions after your procedure. What can I expect after the procedure? After the procedure, it is common to have:  Pain or discomfort around your incision site.  Drainage from your incision. Follow these instructions at home:    Take over-the-counter and prescription medicines only as told by your health care provider.  If you were prescribed an antibiotic medicine, take it as told by your health care provider.Do not stop taking the antibiotic even if you start to feel better.  Followinstructions from your health care provider about: ? How to take care of your incision. ? When and how you should change your packing and bandage (dressing). Wash your hands with soap and water before you change your dressing. If soap and water are not available, use hand sanitizer. ? When you should remove your dressing.  Do not take baths, swim, or use a hot tub until your health care provider approves.  Keep all follow-up visits as told by your health care provider. This is important.  Check your incision area every day for signs of infection. Check for: ? More redness, swelling, or pain. ? More fluid or blood. ? Warmth. ? Pus or a bad smell. Contact a health care provider if:  Your cyst or abscess returns.  You have a fever.  You have more redness, swelling, or pain around your incision.  You have more fluid or blood coming from your incision.  Your incision feels warm to the touch.  You have pus or a bad smell coming from your incision. Get help right away if:  You have severe pain or  bleeding.  You cannot eat or drink without vomiting.  You have decreased urine output.  You become short of breath.  You have chest pain.  You cough up blood.  The area where the incision and drainage occurred becomes numb or it tingles. This information is not intended to replace advice given to you by your health care provider. Make sure you discuss any questions you have with your health care provider. Document Released: 06/02/2011 Document Revised: 08/10/2015 Document Reviewed: 12/29/2014 Elsevier Interactive Patient Education  2017 [employer].   How to Use a Postop Shoe A cast/postop shoe is a rigid shoe that you wear over a cast. You may have to wear a cast shoe after a foot or leg injury or surgery. It helps you to walk and it keeps your cast clean and dry or pressure of the surgical area. Your health care provider may give you a cast shoe after you are allowed to use your injured foot or leg to support (bear) your weight. What are the risks? A cast shoe that is not worn properly can lead to cast damage.  Make sure the shoe is positioned and adjusted properly to support the cast.  Bear weight on the cast shoe only as told by your health care provider. How to use a cast shoe  Wear the cast shoe as told by your health care provider.  Follow the manufacturer's instructions for use.  Make sure the cast shoe is secured tightly and adjusted properly.  Do not wear the cast shoe  while you are resting at home. Do not wear it while you are sleeping.  Keep the cast shoe clean and dry.  Do not wear any other kind of footwear until your health care provider says you can. How to care for your cast shoe  Use mild soap and water to clean your cast shoe.  Make sure your cast shoe is completely clean and dry before you put it over your cast. Contact a health care provider if:  Your cast gets wet or damaged.  You have foot pain when you wear the cast shoe.  You have foot  pain when you move.  Your foot pain is getting worse or the pain is not getting better over time. This information is not intended to replace advice given to you by your health care provider. Make sure you discuss any questions you have with your health care provider. Document Released: 04/17/2004 Document Revised: 08/16/2015 Document Reviewed: 09/27/2014 Elsevier Interactive Patient Education  2017 [place] - wear the show at all times when up bearing weight on the leg. Weight bear through the heel of the foot ONLY.  Do not bear weight on the toes. Keep dressing clean and dry.

## 2016-05-22 NOTE — Progress Notes (Signed)
Subjective: 3 Days Post-Op Procedure(s) (LRB): MINOR IRRIGATION AND DEBRIDEMENT Right great toe (Right) Patient reports pain as mild.    Objective: Vital signs in last 24 hours: Temp:  [98.5 F (36.9 C)-99.7 F (37.6 C)] 99.1 F (37.3 C) (03/01 0551) Pulse Rate:  [58-65] 65 (03/01 0551) Resp:  [18] 18 (02/28 2003) BP: (154-182)/(60-87) 163/87 (03/01 0551) SpO2:  [94 %-100 %] 96 % (03/01 0551)  Intake/Output from previous day: 02/28 0701 - 03/01 0700 In: 1742.5 [P.O.:840; I.V.:802.5; IV Piggyback:100] Out: -  Intake/Output this shift: Total I/O In: 797.5 [I.V.:797.5] Out: -    Recent Labs  05/20/16 0501 05/21/16 0526  HGB 11.5* 11.3*    Recent Labs  05/20/16 0501 05/21/16 0526  WBC 13.3* 13.2*  RBC 4.33 4.36  HCT 35.4* 35.5*  PLT 246 244    Recent Labs  05/20/16 0501 05/21/16 0526  NA 137 142  K 3.9 3.6  CL 106 110  CO2 25 26  BUN 22* 23*  CREATININE 1.71* 2.01*  GLUCOSE 131* 124*  CALCIUM 8.5* 8.6*    Recent Labs  05/20/16 0501 05/21/16 0526  INR 3.23 3.08    No cellulitis present Compartment soft Toe appearance continues to improve. No drainage. No erythema  Assessment/Plan: 3 Days Post-Op Procedure(s) (LRB): MINOR IRRIGATION AND DEBRIDEMENT Right great toe (Right) Discharge home with home health  4 weeks of po Cipro 750 bid  [name] V 05/22/2016, 6:24 AM

## 2016-05-22 NOTE — Progress Notes (Signed)
Patient given discharge instructions, and verbalized an understanding of all discharge instructions.  Patient agrees with discharge plan, and is being discharged in stable medical condition.  Patient given transportation via wheelchair. 

## 2016-05-22 NOTE — Discharge Summary (Signed)
Physician Discharge Summary  [name] NKN:[id number] DOB: 08/14/1933 DOA: 05/15/2016  PCP: Aretta Nip, MD  Admit date: 05/15/2016 Discharge date: 05/22/2016  Time spent: 40 minutes  Recommendations for Outpatient Follow-up:  1. Patient complete ciprofloxacin 750 twice a day on 06/22/16 for treatment of osteomyelitis of the toe 2. Patient may benefit from down titrating colchicine to prevent further diarrhea 3. Recommend labs in about one week 4. Recommend labs in 1 week at care physician office to check on kidney function-is on Lasix and metolazone 5. Should wear Darco wedge shoe and patient is weightbearing as tolerated through the heel with issue 6. Patient will be following up with Dr. Maureen Ralphs who will be seeing her in the next week  Discharge Diagnoses:  Principal Problem:   Cellulitis of great toe of right foot Active Problems:   HTN (hypertension)   Dyslipidemia   Atrial fibrillation (HCC)   Chronic kidney disease, stage IV (severe) (HCC)   Hyperlipidemia   GERD (gastroesophageal reflux disease)   Diabetes mellitus with renal complications (HCC)   Chronic diastolic heart failure (HCC)   Pressure injury of skin   Discharge Condition: Improved  Diet recommendation: Heart healthy diabetic diet  Filed Weights   05/15/16 1907 05/18/16 0909 05/19/16 0425  Weight: 83.5 kg (184 lb 1.4 oz) 84 kg (185 lb 3 oz) 86.3 kg (190 lb 4.1 oz)    History of present illness:  83 y.o.? 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 fib on Coumadin,  chronic diastolic CHF,  stage IV chronic kidney disease  presented to ED on 05/15/16 due to worsening pain, swelling, redness and drainage from right great toe.  She gives history of eruptions from tophaceous deposits in the past in her fingers and elbow.  Admitted for cellulitis complicating tophaceous gout Vs abscess &  osteomyelitis.  Initially treated as cellulitis complicating tophaceous gout but subsequently concerning for abscess.   MRI of right foot 2/25 confirmed abscess and possible osteomyelitis. Orthopedics consulting did amputation and  I&D on 2/26.   Hospital Course:  1. Cellulitis of the right great toe complicating tophaceous gout Vs Abscess & osteomyelitis:  initially felt to have cellulitis, possible abscess and osteomyelitis.  Orthopedics was consulted and initially recommended nonoperative management with foot elevation and IV antibiotics. Changed IV vancomycin and Zosyn-->Cefazolin-->kelfex 500 tid after d/w Dr. Megan Salon ID on 05/21/16--decision was made to place on Cipro 750 twice a day by orthopedics and patient will be seen by them in the future in a week and is to wear the Darco wedge boot with ambulation. Pain meds were prescribed on discharge including tramadol and oxycodone limited scripts 2. Uncontrolled type II DM with renal complications:Held Victoza. Placed on NovoLog SSI.Reasonable inpatient control. Blood sugars ranging 195--146 patient was replaced back on her usual sliding scale on discharge and Victoza was resumed-- 3. Essential hypertension:Mildly uncontrolled. Continue amlodipine 5, metoprolol 50 twice a day-reasonably controlled 4. Chronic diastolic CHF: She seems to have chronic lower extremity edema but otherwise appears compensated. Continue Lasix 80 mg daily and when necessary metolazone when discharged 5. Stage IV chronic kidney disease:Creatinine appears slightlt y elevated--forcing oral fluids.  Labs am 6. Gout: management as per problem #1. Consider outpatient rheumatology consultation for management of tophaceous gout. 7. Hyperlipidemia: Continue statins. 8. Paroxysmal A. fib: Controlled ventricular rate. Continue amiodarone 100 daily, toprol 50 bid  and  Coumadin. INR therapeutic 3.2 9. GERD:PPI. 10. Anemia: Stable. 11. Hypokalemia:  Replaced.  Procedures:  Incision and drainage on 2/26  (i.e. Studies not automatically included, echos, thoracentesis, etc; not x-rays)  Consultations:   orthopedics   Discharge Exam: Vitals:   05/21/16 2003 05/22/16 0551  BP: (!) 182/60 (!) 163/87  Pulse: (!) 58 65  Resp: 18   Temp: 99.7 F (37.6 C) 99.1 F (37.3 C)    General: alert pleasant oriented and in nad Cardiovascular: s1 s2no m/r/g Respiratory: clear no  adde dsound Wound not seen this am  Discharge Instructions   Discharge Instructions    Diet - low sodium heart healthy    Complete by:  As directed    Discharge instructions    Complete by:  As directed    You will need to take ciprofloxacin 750 mg twice a day for the next month and your orthopedic surgeon Dr. Maureen Ralphs will follow-up with you in the outpatient setting with regards to your toe and the wound you will need to continue the dressings as per his instructions. I would recommend that you decrease the dose of colchicine from 0.6 mg daily to once daily if you have further diarrhea and of course this can be increased back to her usual twice a day dosing if you have significant gout pain. Take your metolazone sparingly for edema and you'll continue on her other medications including her Lasix etc. I would recommend you get some lab work done at your primary physician's office in about one week. There the boot on her leg at all times when moving around and you should be weightbearing as tolerated with the boot   Increase activity slowly    Complete by:  As directed      Current Discharge Medication List    START taking these medications   Details  ciprofloxacin (CIPRO) 750 MG tablet Take 1 tablet (750 mg total) by mouth 2 (two) times daily. Qty: 60 tablet, Refills: 0    HYDROcodone-acetaminophen (NORCO/VICODIN) 5-325 MG tablet Take 1-2 tablets by mouth every 4 (four) hours as needed (breakthrough pain). Qty: 30 tablet, Refills: 0    traMADol (ULTRAM)  50 MG tablet Take 1 tablet (50 mg total) by mouth every 6 (six) hours as needed for moderate pain or severe pain. Qty: 30 tablet, Refills: 0      CONTINUE these medications which have NOT CHANGED   Details  acidophilus (RISAQUAD) CAPS capsule Take 1 capsule by mouth every evening.     amiodarone (PACERONE) 200 MG tablet Take 0.5 tablets (100 mg total) by mouth daily. Qty: 90 tablet, Refills: 2    amLODipine (NORVASC) 10 MG tablet Take 5 mg by mouth daily at 12 noon.  Refills: 1    atorvastatin (LIPITOR) 40 MG tablet Take 40 mg by mouth every evening.     colchicine 0.6 MG tablet Take 0.6 mg by mouth 2 (two) times daily.    Cranberry 250 MG CAPS Take 750 mg by mouth daily.     econazole nitrate 1 % cream Apply 1 application topically daily as needed (to groin rash).     esomeprazole (NEXIUM) 20 MG capsule Take 20 mg by mouth daily before breakfast.     ferrous sulfate 325 (65 FE) MG tablet Take 325 mg by mouth daily with breakfast.    fluticasone (FLONASE) 50 MCG/ACT nasal spray Place 2 sprays into both nostrils daily as needed for rhinitis.     furosemide (LASIX) 80 MG tablet  Take 40-80 mg by mouth 2 (two) times daily. Pt takes one tablet in the morning and one-half tablet in the evening.    HUMALOG KWIKPEN 100 UNIT/ML KiwkPen INJECT 7 UNITS SUBCUTANEOUSLY IN THE MORNING AND 8 UNITS IN THE EVENING Qty: 15 mL, Refills: 2    metolazone (ZAROXOLYN) 5 MG tablet Take 5 mg by mouth daily as needed (for edema).  Refills: 3    metoprolol (LOPRESSOR) 50 MG tablet Take 50 mg by mouth 2 (two) times daily.    polyethylene glycol (MIRALAX / GLYCOLAX) packet Take 17 g by mouth daily as needed for mild constipation.    potassium chloride (KLOR-CON 10) 10 MEQ tablet Take 3 tablets in the am and 3 tablets in the pm Qty: 180 tablet, Refills: 3    VICTOZA 18 MG/3ML SOPN INJECT 1.2MG  SUBCUTANEOUSLY DAILY Qty: 6 pen, Refills: 2    warfarin (COUMADIN) 5 MG tablet Take 2.5-5 mg by mouth every  evening. Pt takes one-half tablet on Monday and one tablet all other days.      STOP taking these medications     Multiple Vitamin (MULTIVITAMIN WITH MINERALS) TABS tablet      pantoprazole (PROTONIX) 40 MG tablet        Allergies  Allergen Reactions  . Lotensin [Benazepril Hcl] Anaphylaxis  . Zantac [Ranitidine Hcl] Anaphylaxis  . Tape Itching and Rash      The results of significant diagnostics from this hospitalization (including imaging, microbiology, ancillary and laboratory) are listed below for reference.    Significant Diagnostic Studies: Mr Foot Right Wo Contrast  Result Date: 05/18/2016 CLINICAL DATA:  Ulceration and cellulitis of the right great toe in a diabetic patient. The patient first noticed changes in the toe approximately 1 week ago. EXAM: MRI OF THE RIGHT FOREFOOT WITHOUT CONTRAST TECHNIQUE: Multiplanar, multisequence MR imaging of the right foot was performed. No intravenous contrast was administered. COMPARISON:  Plain films of the right foot 05/15/2016. FINDINGS: Bones/Joint/Cartilage Marked marrow edema is seen throughout the proximal and distal phalanges of the great toe, worst in the distal phalanx. Patchy marrow edema extends into the first metatarsal to the level of the proximal metaphysis. Edema in the first metatarsal is most intense in its distal 2.5 cm. No fracture is identified. Ligaments Intact. Muscles and Tendons Intact. Soft tissues A focal fluid collection extends from the dorsal aspect of the IP joint of the great toe to the level the first MTP joint. It measures 3.3 cm long by approximately 2.1 cm transverse by 1 cm craniocaudal. At the level of the neck of the proximal phalanx, the collection is circum pharyngeal about the proximal phalanx extending in the plantar soft tissues at the base of the distal phalanx. Tiny focus of susceptibility artifact in the cutaneous tissues thumb distal phalanx is compatible with the presence of a foreign body,  possibly microscopic. Skin ulceration along the medial aspect of the tuft is identified. There is some soft tissue swelling over the dorsum of the foot. IMPRESSION: Skin ulceration with a large abscess extending the length of the proximal phalanx of the great toe over its dorsal margin. The abscess is circumferential about the head and neck of the proximal phalanx of the great toe and extends in the plantar soft tissues of the toe along the distal phalanx. Osteomyelitis throughout the proximal and distal phalanges of the great toe. Marrow signal abnormality in the distal 2.5 cm of the first metatarsal is consistent with osteomyelitis. Milder degree of marrow edema extends  in the first metatarsal to the proximal metaphysis. This signal change could be reactive but is worrisome for osteomyelitis. Electronically Signed   By: Inge Rise M.D.   On: 05/18/2016 13:43   Dg Foot Complete Right  Result Date: 05/15/2016 CLINICAL DATA:  RIGHT great toe discoloration and oozing is seen. Concern for osteomyelitis. EXAM: RIGHT FOOT COMPLETE - 3+ VIEW COMPARISON:  None. FINDINGS: There is diffuse soft tissue swelling. Osteopenia is noted. There is vascular calcification consistent with diabetes mellitus. There is no fracture or dislocation. There is minimal cortical irregularity of the dorsal aspect of the distal phalanx of the great toe, seen best on the oblique radiograph, which could represent early changes of osteomyelitis. IMPRESSION: Minimal cortical irregularity of the dorsal aspect of the distal phalanx great toe, which could represent early changes of osteomyelitis.If further investigation desired, and no contraindications, consider MRI. Electronically Signed   By: Staci Righter M.D.   On: 05/15/2016 16:53    Microbiology: Recent Results (from the past 240 hour(s))  MRSA PCR Screening     Status: None   Collection Time: 05/19/16  8:06 AM  Result Value Ref Range Status   MRSA by PCR NEGATIVE NEGATIVE Final     Comment:        The GeneXpert MRSA Assay (FDA approved for NASAL specimens only), is one component of a comprehensive MRSA colonization surveillance program. It is not intended to diagnose MRSA infection nor to guide or monitor treatment for MRSA infections.   Aerobic/Anaerobic Culture (surgical/deep wound)     Status: None (Preliminary result)   Collection Time: 05/19/16  1:55 PM  Result Value Ref Range Status   Specimen Description TOE RIGHT GREAT  Final   Special Requests NONE  Final   Gram Stain   Final    FEW WBC PRESENT, PREDOMINANTLY PMN FEW GRAM POSITIVE COCCI IN CLUSTERS Gram Stain Report Called to,Read Back By and Verified With: DR. Maureen Ralphs [id number] [id number]    Culture   Final    MODERATE STAPHYLOCOCCUS AUREUS NO ANAEROBES ISOLATED; CULTURE IN PROGRESS FOR 5 DAYS    Report Status PENDING  Incomplete   Organism ID, Bacteria STAPHYLOCOCCUS AUREUS  Final      Susceptibility   Staphylococcus aureus - MIC*    CIPROFLOXACIN <=0.5 SENSITIVE Sensitive     ERYTHROMYCIN <=0.25 SENSITIVE Sensitive     GENTAMICIN <=0.5 SENSITIVE Sensitive     OXACILLIN 0.5 SENSITIVE Sensitive     TETRACYCLINE <=1 SENSITIVE Sensitive     VANCOMYCIN 1 SENSITIVE Sensitive     TRIMETH/SULFA <=10 SENSITIVE Sensitive     CLINDAMYCIN <=0.25 SENSITIVE Sensitive     RIFAMPIN <=0.5 SENSITIVE Sensitive     Inducible Clindamycin NEGATIVE Sensitive     * MODERATE STAPHYLOCOCCUS AUREUS     Labs: Basic Metabolic Panel:  Recent Labs Lab 05/17/16 0543 05/18/16 0524 05/19/16 0521 05/20/16 0501 05/21/16 0526 05/22/16 0535  NA 140 139  --  137 142 141  K 3.4* 3.6  --  3.9 3.6 3.3*  CL 107 102  --  106 110 109  CO2 27 29  --  25 26 25   GLUCOSE 141* 156*  --  131* 124* 129*  BUN 25* 23*  --  22* 23* 19  CREATININE 1.72* 1.63* 1.70* 1.71* 2.01* 1.56*  CALCIUM 8.9 9.3  --  8.5* 8.6* 8.5*   Liver Function Tests:  Recent Labs Lab 05/15/16 1556 05/21/16 0526  AST 19 20  ALT 18 19   ALKPHOS  51 42  BILITOT 0.5 0.5  PROT 7.0 6.1*  ALBUMIN 3.1* 2.4*   No results for input(s): LIPASE, AMYLASE in the last 168 hours. No results for input(s): AMMONIA in the last 168 hours. CBC:  Recent Labs Lab 05/15/16 1556  05/17/16 0543 05/19/16 0521 05/20/16 0501 05/21/16 0526 05/22/16 0535  WBC 12.7*  < > 11.0* 10.8* 13.3* 13.2* 11.8*  NEUTROABS 9.3*  --   --   --   --   --   --   HGB 12.1  < > 11.3* 12.2 11.5* 11.3* 11.1*  HCT 36.8  < > 34.8* 36.7 35.4* 35.5* 34.1*  MCV 81.1  < > 80.7 80.5 81.8 81.4 81.4  PLT 254  < > 225 275 246 244 266  < > = values in this interval not displayed. Cardiac Enzymes: No results for input(s): CKTOTAL, CKMB, CKMBINDEX, TROPONINI in the last 168 hours. BNP: BNP (last 3 results) No results for input(s): BNP in the last 8760 hours.  ProBNP (last 3 results) No results for input(s): PROBNP in the last 8760 hours.  CBG:  Recent Labs Lab 05/21/16 0737 05/21/16 1129 05/21/16 1647 05/21/16 2008 05/22/16 0745  GLUCAP 122* 195* 146* 207* 143*       Signed:  Nita Sells MD   [facility] 05/22/2016, 8:55 AM

## 2016-05-22 NOTE — Progress Notes (Signed)
[place] for warfarin Indication: Afib  Allergies  Allergen Reactions  . Lotensin [Benazepril Hcl] Anaphylaxis  . Zantac [Ranitidine Hcl] Anaphylaxis  . Tape Itching and Rash   Patient Measurements: Height: 5\' 6"  (167.6 cm) Weight: 190 lb 4.1 oz (86.3 kg) IBW/kg (Calculated) : 59.3  Vital Signs: Temp: 99.1 F (37.3 C) (03/01 0551) Temp Source: Oral (03/01 0551) BP: 163/87 (03/01 0551) Pulse Rate: 65 (03/01 0551)  Labs:  Recent Labs  05/20/16 0501 05/21/16 0526 05/22/16 0535  HGB 11.5* 11.3* 11.1*  HCT 35.4* 35.5* 34.1*  PLT 246 244 266  LABPROT 33.7* 32.5* 25.8*  INR 3.23 3.08 2.31  CREATININE 1.71* 2.01* 1.56*    Estimated Creatinine Clearance: 31.8 mL/min (by C-G formula based on SCr of 1.56 mg/dL (H)).  Medical History: Past Medical History:  Diagnosis Date  . Diabetes mellitus without complication ([place])   . Fall at home Sept. 3, 2016   Fx  3 ribs  . Hypertension   . Ileus, postoperative ([place]) 12/17/2012  . Irregular heart beat   . Renal disorder    Medications: this admission Scheduled:  . acidophilus  1 capsule Oral QPM  . amiodarone  100 mg Oral Daily  . amLODipine  5 mg Oral Q1200  . atorvastatin  40 mg Oral QPM  . cephALEXin  500 mg Oral Q8H  . colchicine  0.6 mg Oral BID  . ferrous sulfate  325 mg Oral Q breakfast  . insulin aspart  0-9 Units Subcutaneous TID AC & HS  . metoprolol  50 mg Oral BID  . multivitamin with minerals  1 tablet Oral Daily  . pantoprazole  40 mg Oral QPM  . potassium chloride  30 mEq Oral BID  . sodium chloride flush  3 mL Intravenous Q12H  . Warfarin - Pharmacist Dosing Inpatient   Does not apply q1800   Assessment: 40 yoF known to pharmacy from antibiotic dosing. PMH DMT2, HTN, HLD, gout, AFib on warfarin, dCHF, CKD4. Admitted for cellulitis/osteo/gout flare of R great toe; pharmacy to dose warfarin while admitted.   Baseline INR therapeutic  Prior anticoagulation:  warfarin 5 mg daily except 2.5 mg on Mondays; last dose 2/21.  Significant events:  Today, 05/22/2016:  INR back into therapeutic range after holding warfarin dose on 2/27 and administering 1/2 home dosage on 2/28   Major drug interactions:   amiodarone, but on chronic therapy and patient's usual warfarin dosage presumably already adjusted for this  broad-spectrum abx could increase INR  No bleeding issues per nursing  H/H stable; pltc WNL  CHO modified diet ordered- eating 90-100%.    Goal of Therapy: INR 2-3  Plan: 1) Warfarin 5 mg PO today x 1 as per patient's home regimen. 2) Daily INR while inpatient 3) Needs close INR monitoring after discharge - suggest rechecking on Monday 3/5 if possible.  Clayburn Pert, PharmD, BCPS Pager: [phone] 05/22/2016  7:09 AM

## 2016-05-22 NOTE — Care Management Note (Signed)
Case Management Note  Patient Details  Name: [name] MRN: [id number] Date of Birth: 1933/05/19  Subjective/Objective:                  Cellulitis of great toe of right foot Action/Plan: Discharge planning Expected Discharge Date:  05/22/16               Expected Discharge Plan:  [place]  In-House Referral:     Discharge planning Services  CM Consult  Post Acute Care Choice:  Home Health Choice offered to:  Patient  DME Arranged:  N/A DME Agency:  NA  HH Arranged:  PT, RN [place] Agency:  [facility]  Status of Service:  Completed, signed off  If discussed at [place] of Stay Meetings, dates discussed:    Additional Comments: Cm met with pt in room to offer choice of home health agency. Pt chooses [facility] to render HHPT/RN (INR checks) per Pharmacy recommendation.  Cm has requested HHRN be amended to reflect pharmacist recommendation.  Referral called to [facility] [place]; waiting for orders; waiting for callback from [facility]. Dellie Catholic, RN 05/22/2016, 12:31 PM

## 2016-05-24 LAB — AEROBIC/ANAEROBIC CULTURE W GRAM STAIN (SURGICAL/DEEP WOUND)

## 2016-05-24 LAB — AEROBIC/ANAEROBIC CULTURE (SURGICAL/DEEP WOUND)

## 2016-05-26 ENCOUNTER — Telehealth: Payer: Self-pay | Admitting: Interventional Cardiology

## 2016-05-26 NOTE — Telephone Encounter (Signed)
Patient is calling about [facility] and would like to know if Dr. Saunders Revel could send a request for [facility] to come and check her blood. Patient was recently discharged from hospital and had surgery on her big toe, due to an infection. [facility] # [phone]. Thanks.

## 2016-05-26 NOTE — Telephone Encounter (Signed)
Spoke with pt and she states that hospital had mentioned having home health check her INR.  Advised I would send message to [facility] to see about getting her set up for Home Health to check.  Pt appreciative for call.

## 2016-05-26 NOTE — Telephone Encounter (Signed)
Spoke with pt and instructed that would need to get her PCP to order [place] for her and when ordered then they can call us and we will give order for them to check  her INR  and she states understanding

## 2016-05-28 DIAGNOSIS — E1122 Type 2 diabetes mellitus with diabetic chronic kidney disease: Secondary | ICD-10-CM | POA: Diagnosis not present

## 2016-05-28 DIAGNOSIS — N184 Chronic kidney disease, stage 4 (severe): Secondary | ICD-10-CM | POA: Diagnosis not present

## 2016-05-28 DIAGNOSIS — E1169 Type 2 diabetes mellitus with other specified complication: Secondary | ICD-10-CM | POA: Diagnosis not present

## 2016-05-28 DIAGNOSIS — I480 Paroxysmal atrial fibrillation: Secondary | ICD-10-CM | POA: Diagnosis not present

## 2016-06-05 ENCOUNTER — Telehealth: Payer: Self-pay | Admitting: Interventional Cardiology

## 2016-06-05 ENCOUNTER — Ambulatory Visit (INDEPENDENT_AMBULATORY_CARE_PROVIDER_SITE_OTHER): Payer: Medicare Other | Admitting: Cardiovascular Disease

## 2016-06-05 DIAGNOSIS — I480 Paroxysmal atrial fibrillation: Secondary | ICD-10-CM

## 2016-06-05 DIAGNOSIS — Z5181 Encounter for therapeutic drug level monitoring: Secondary | ICD-10-CM

## 2016-06-05 LAB — POCT INR: INR: 1.9

## 2016-06-05 NOTE — Telephone Encounter (Signed)
New Message  Calling to report pts PTINR.Melanie KitchenMarland Costa

## 2016-06-05 NOTE — Telephone Encounter (Signed)
See anticoag note from 3/15 for details.

## 2016-06-10 ENCOUNTER — Other Ambulatory Visit: Payer: Self-pay | Admitting: Endocrinology

## 2016-06-10 ENCOUNTER — Other Ambulatory Visit: Payer: Self-pay | Admitting: Interventional Cardiology

## 2016-06-12 ENCOUNTER — Ambulatory Visit (INDEPENDENT_AMBULATORY_CARE_PROVIDER_SITE_OTHER): Payer: Medicare Other | Admitting: Cardiology

## 2016-06-12 DIAGNOSIS — Z5181 Encounter for therapeutic drug level monitoring: Secondary | ICD-10-CM

## 2016-06-12 DIAGNOSIS — I480 Paroxysmal atrial fibrillation: Secondary | ICD-10-CM

## 2016-06-12 LAB — POCT INR: INR: 2.5

## 2016-07-04 ENCOUNTER — Encounter: Payer: Self-pay | Admitting: Interventional Cardiology

## 2016-07-04 ENCOUNTER — Ambulatory Visit (INDEPENDENT_AMBULATORY_CARE_PROVIDER_SITE_OTHER): Payer: Medicare Other | Admitting: Cardiovascular Disease

## 2016-07-04 DIAGNOSIS — I480 Paroxysmal atrial fibrillation: Secondary | ICD-10-CM

## 2016-07-04 DIAGNOSIS — Z5181 Encounter for therapeutic drug level monitoring: Secondary | ICD-10-CM

## 2016-07-04 LAB — POCT INR: INR: 2.8

## 2016-07-10 DIAGNOSIS — K210 Gastro-esophageal reflux disease with esophagitis: Secondary | ICD-10-CM | POA: Diagnosis not present

## 2016-07-10 DIAGNOSIS — R198 Other specified symptoms and signs involving the digestive system and abdomen: Secondary | ICD-10-CM | POA: Diagnosis not present

## 2016-07-11 DIAGNOSIS — H04212 Epiphora due to excess lacrimation, left lacrimal gland: Secondary | ICD-10-CM | POA: Diagnosis not present

## 2016-07-11 DIAGNOSIS — H04123 Dry eye syndrome of bilateral lacrimal glands: Secondary | ICD-10-CM | POA: Diagnosis not present

## 2016-07-11 DIAGNOSIS — H40023 Open angle with borderline findings, high risk, bilateral: Secondary | ICD-10-CM | POA: Diagnosis not present

## 2016-07-15 ENCOUNTER — Ambulatory Visit: Payer: Medicare Other | Admitting: Podiatry

## 2016-07-18 ENCOUNTER — Encounter: Payer: Self-pay | Admitting: Endocrinology

## 2016-07-18 ENCOUNTER — Ambulatory Visit (INDEPENDENT_AMBULATORY_CARE_PROVIDER_SITE_OTHER): Payer: Medicare Other | Admitting: Endocrinology

## 2016-07-18 VITALS — BP 138/70 | HR 68 | Ht 65.0 in | Wt 182.0 lb

## 2016-07-18 DIAGNOSIS — Z794 Long term (current) use of insulin: Secondary | ICD-10-CM | POA: Diagnosis not present

## 2016-07-18 DIAGNOSIS — E1165 Type 2 diabetes mellitus with hyperglycemia: Secondary | ICD-10-CM

## 2016-07-18 LAB — LIPID PANEL
Cholesterol: 112 mg/dL (ref 0–200)
HDL: 53.7 mg/dL (ref >39.00)
LDL Cholesterol: 36 mg/dL (ref 0–99)
NONHDL: 58.66
Total CHOL/HDL Ratio: 2
Triglycerides: 111 mg/dL (ref 0.0–149.0)
VLDL: 22.2 mg/dL (ref 0.0–40.0)

## 2016-07-18 LAB — COMPREHENSIVE METABOLIC PANEL
ALBUMIN: 3.4 g/dL — AB (ref 3.5–5.2)
ALK PHOS: 62 U/L (ref 39–117)
ALT: 17 U/L (ref 0–35)
AST: 21 U/L (ref 0–37)
BILIRUBIN TOTAL: 0.5 mg/dL (ref 0.2–1.2)
BUN: 31 mg/dL — ABNORMAL HIGH (ref 6–23)
CALCIUM: 9.1 mg/dL (ref 8.4–10.5)
CO2: 28 mEq/L (ref 19–32)
Chloride: 105 mEq/L (ref 96–112)
Creatinine, Ser: 1.49 mg/dL — ABNORMAL HIGH (ref 0.40–1.20)
GFR: 43.22 mL/min — AB (ref >60.00)
GLUCOSE: 105 mg/dL — AB (ref 70–99)
Potassium: 3.8 mEq/L (ref 3.5–5.1)
Sodium: 139 mEq/L (ref 135–145)
TOTAL PROTEIN: 6.7 g/dL (ref 6.0–8.3)

## 2016-07-18 LAB — HEMOGLOBIN A1C: Hgb A1c MFr Bld: 6.4 % (ref 4.6–6.5)

## 2016-07-18 NOTE — Progress Notes (Signed)
Please let patient know that the lab result is satisfactory and no further action needed

## 2016-07-18 NOTE — Progress Notes (Signed)
Patient ID: [name], female   DOB: 29-Aug-1933, 83 y.o.   MRN: [id number]    Reason for Appointment: Follow-up for Type 2 Diabetes  Referring physician: Jordan Hawks Rankins  History of Present Illness:          Diagnosis: Type 2 diabetes mellitus, date of diagnosis: ? 68 years ago        Past history: She was initially treated with Metformin but this was done because it caused GI side effects  May also tried Actos at some point, probably started because of the fear of side effects but did not know if it helped Actos stopped ? Reason Over the last few years she has been taking Precose with meals and also Januvia in increasing doses Apparently her blood sugars have been higher since 9/14 when she had abdominal surgery and before that they were as low as 70. On her initial consultation in 6/15 because of her renal dysfunction her medication regimen was changed. Previously was taking Januvia, Precose and glipizide without adequate control; her blood sugars were occasionally over 200 fasting and A1c was 9.4 She was started on Victoza 0.6 mg and Lantus insulin 8 units a day but the Lantus had to be stopped because of relatively low fasting glucose.  Since she had tried Prandin and glipizide and her postprandial readings were as high as 330 she was started on Humalog at mealtimes in 8/15  Recent history:   INSULIN regimen: Humalog 7 units U acb, 7  acs  She has been taking small doses of Humalog  at breakfast and supper  Also taking Victoza at bedtime   Her A1c has come down to 6.9, previously 8%  Current blood sugar patterns, current management  and problems identified:  She has had generally lower blood sugars at night compared to before without hypoglycemia and this may be from her eating somewhat less overall  Also has lost some weight since her last visit  She now says that she would take Victoza only if her blood sugars at night are over 100 which is happening more in  the last week or so  Has only a couple of readings after waking up which are slightly higher than normal but consistent  Does not check readings any other time  Her appetite is recently fairly good and she had biscuits and half a boost this morning  May not be eating as much at lunchtime lately  She has a relatively large breakfast, sometimes high in fat also  Usually eating a 1/2 sandwich at lunch, sometimes less       Oral hypoglycemic drugs the patient is taking are: none  Side effects from medications have been: None  Glucose monitoring:  done once  a day        Glucometer:  One Touch Verio Readings by download  Mean values apply above for all meters except median for One Touch  PRE-MEAL Fasting Lunch Dinner Bedtime Overall  Glucose range:    76-1 89    Mean/median: 105    115 115     Self-care:   Meals: 3 meals per day.   Breakfast: eggs, grits,meat;  sometimes will have spaghetti or rice at supper. Dinner 6 pm           Exercise:  none, unable to         Dietician visit: Most recent: Unknown.                Weight  history:  Wt Readings from Last 3 Encounters:  07/18/16 182 lb (82.6 kg)  05/19/16 190 lb 4.1 oz (86.3 kg)  05/18/16 185 lb (83.9 kg)   Glycemic control:  Lab Results  Component Value Date   HGBA1C 6.4 07/18/2016   HGBA1C 6.9 04/23/2016   HGBA1C 8.0 (H) 12/11/2015   Lab Results  Component Value Date   MICROALBUR 0.7 09/11/2015   LDLCALC 36 07/18/2016   CREATININE 1.49 (H) 07/18/2016     Allergies as of 07/18/2016      Reactions   Lotensin [benazepril Hcl] Anaphylaxis   Zantac [ranitidine Hcl] Anaphylaxis   Tape Itching, Rash      Medication List       Accurate as of 07/18/16  3:25 PM. Always use your most recent med list.          acidophilus Caps capsule Take 1 capsule by mouth every evening.   amiodarone 200 MG tablet Commonly known as:  PACERONE Take 0.5 tablets (100 mg total) by mouth daily.   amLODipine 10 MG  tablet Commonly known as:  NORVASC Take 5 mg by mouth daily at 12 noon.   atorvastatin 40 MG tablet Commonly known as:  LIPITOR Take 40 mg by mouth every evening.   B-D UF III MINI PEN NEEDLES 31G X 5 MM Misc Generic drug:  Insulin Pen Needle USE 3 PEN NEEDLES PER DAY   colchicine 0.6 MG tablet Take 0.6 mg by mouth daily.   Cranberry 250 MG Caps Take 750 mg by mouth daily.   econazole nitrate 1 % cream Apply 1 application topically daily as needed (to groin rash).   esomeprazole 20 MG capsule Commonly known as:  NEXIUM Take 20 mg by mouth daily before breakfast.   ferrous sulfate 325 (65 FE) MG tablet Take 325 mg by mouth daily with breakfast.   fluticasone 50 MCG/ACT nasal spray Commonly known as:  FLONASE Place 2 sprays into both nostrils daily as needed for rhinitis.   furosemide 80 MG tablet Commonly known as:  LASIX Take 40-80 mg by mouth 2 (two) times daily. Pt takes one tablet in the morning and one-half tablet in the evening.   HUMALOG KWIKPEN 100 UNIT/ML KiwkPen Generic drug:  insulin lispro INJECT 7 UNITS SUBCUTANEOUSLY IN THE MORNING AND 8 UNITS IN THE EVENING   HYDROcodone-acetaminophen 5-325 MG tablet Commonly known as:  NORCO/VICODIN Take 1-2 tablets by mouth every 4 (four) hours as needed (breakthrough pain).   metolazone 5 MG tablet Commonly known as:  ZAROXOLYN Take 5 mg by mouth daily as needed (for edema).   metoprolol 50 MG tablet Commonly known as:  LOPRESSOR Take 50 mg by mouth 2 (two) times daily.   polyethylene glycol packet Commonly known as:  MIRALAX / GLYCOLAX Take 17 g by mouth daily as needed for mild constipation.   potassium chloride 10 MEQ tablet Commonly known as:  KLOR-CON 10 Take 3 tablets in the am and 3 tablets in the pm   traMADol 50 MG tablet Commonly known as:  ULTRAM Take 1 tablet (50 mg total) by mouth every 6 (six) hours as needed for moderate pain or severe pain.   VICTOZA 18 MG/3ML Sopn Generic drug:   liraglutide INJECT 1.2MG  SUBCUTANEOUSLY DAILY   warfarin 5 MG tablet Commonly known as:  COUMADIN TAKE AS DIRECTED BY COUMADIN CLINIC FOR 1 MONTH       Allergies:  Allergies  Allergen Reactions  . Lotensin [Benazepril Hcl] Anaphylaxis  . Zantac [Ranitidine Hcl]  Anaphylaxis  . Tape Itching and Rash    Past Medical History:  Diagnosis Date  . Diabetes mellitus without complication ([place])   . Fall at home Sept. 3, 2016   Fx  3 ribs  . Hypertension   . Ileus, postoperative ([place]) 12/17/2012  . Irregular heart beat   . Renal disorder     Past Surgical History:  Procedure Laterality Date  . CATARACT EXTRACTION, BILATERAL    . HERNIA REPAIR    . I&D EXTREMITY Right 05/19/2016   Procedure: MINOR IRRIGATION AND DEBRIDEMENT Right great toe;  Surgeon: Gaynelle Arabian, MD;  Location: [facility];  Service: Orthopedics;  Laterality: Right;  . INSERTION OF MESH N/A 12/12/2012   Procedure: INSERTION OF MESH;  Surgeon: Madilyn Hook, DO;  Location: [facility];  Service: General;  Laterality: N/A;  . LAPAROSCOPIC LYSIS OF ADHESIONS N/A 12/12/2012   Procedure: LAPAROSCOPIC LYSIS OF ADHESIONS;  Surgeon: Madilyn Hook, DO;  Location: [facility];  Service: General;  Laterality: N/A;  . Right Lumpectomy    . VENTRAL HERNIA REPAIR N/A 12/12/2012   Procedure: LAPAROSCOPIC VENTRAL HERNIA;  Surgeon: Madilyn Hook, DO;  Location: [facility];  Service: General;  Laterality: N/A;    Family History  Problem Relation Age of Onset  . Diabetes Mother   . Diabetes Sister     Social History:  reports that she has quit smoking. Her smoking use included Cigarettes. She has never used smokeless tobacco. She reports that she does not drink alcohol or use drugs.    Review of Systems        Lipids: She has been on high dose Lipitor for hyperlipidemia Has not had any labs from PCP      Lab Results  Component Value Date   CHOL 112 07/18/2016   HDL 53.70 07/18/2016   LDLCALC 36 07/18/2016   LDLDIRECT 37.8 10/27/2013   TRIG  111.0 07/18/2016   CHOLHDL 2 07/18/2016       The blood pressure has been treated with multiple medications including 10 mg amlodipine  Followed by other physicians Including nephrologist   BP Readings from Last 3 Encounters:  07/18/16 138/70  05/22/16 (!) 156/70  04/23/16 140/70     On amiodarone, This has not caused any change in thyroid functions   Lab Results  Component Value Date   TSH 1.41 12/25/2015       She has had history of swelling of legs and is on Lasix; managed by PCP and nephrologist      Chronic kidney disease followed by nephrologist regularly  Creatinine was 1.88 from her PCP last month  Lab Results  Component Value Date   CREATININE 1.49 (H) 07/18/2016   Last diabetic foot exam in 2/17     She has weakness in her legs since 2014; needs a walker and cannot walk Much  Physical Examination:  BP 138/70   Pulse 68   Ht 5\' 5"  (1.651 m)   Wt 182 lb (82.6 kg)   SpO2 96%   BMI 30.29 kg/m     ASSESSMENT:  Diabetes type 2, uncontrolled   See history of present illness for detailed discussion of his current management, blood sugar patterns and problems identified  She has recently better control and not clear why her readings are lower at night and  likely she has reduced her portions at night No hypoglycemia She still has a big breakfast but does not check postprandial readings after breakfast or her light lunch She has also  stopped taking Victoza consistently recently when her blood sugars are low normal at bedtime A1c to be checked  RENAL dysfunction: Followed by nephrologist and PCP  HYPERTENSION: Her blood pressure is fairly good  PLAN:  She will reduce her suppertime dose to 5 units Humalog Start checking some readings after breakfast She will change her Victoza to the morning so she can take it more consistently but will call if she has variability in her blood sugars before next visit Discussed blood sugar targets  Patient  Instructions  Check blood sugars on waking up  2x weekly  Also check blood sugars about 2 hours after a meal and do this after different meals by rotation  Recommended blood sugar levels on waking up is 90-130 and about 2 hours after meal is 130-160  Please bring your blood sugar monitor to each visit, thank you  Victoza in am with Humalog  Humalog 5 at bedtime     [name] 07/18/2016, 3:25 PM   Note: This office note was prepared with Estate agent. Any transcriptional errors that result from this process are unintentional.   ADDENDUM: A1c 6.4, creatinine 1.5

## 2016-07-18 NOTE — Patient Instructions (Addendum)
Check blood sugars on waking up  2x weekly  Also check blood sugars about 2 hours after a meal and do this after different meals by rotation  Recommended blood sugar levels on waking up is 90-130 and about 2 hours after meal is 130-160  Please bring your blood sugar monitor to each visit, thank you  Victoza in am with Humalog  Humalog 5 at bedtime

## 2016-07-22 NOTE — Progress Notes (Signed)
Cardiology Office Note    Date:  07/23/2016   ID:  Melanie, Costa 1933-04-23, MRN [id number]  PCP:  Aretta Nip, MD  Cardiologist: Sinclair Grooms, MD   Chief Complaint  Patient presents with  . Atrial Fibrillation    History of Present Illness:  [name] is a 83 y.o. female who presents for Paroxysmal atrial fib, amiodarone therapy, chronic diastolic heart failure, elderly and frail  She has had some difficulty with infection in her right great toe. She has had debridement performed by Dr. Wynelle Link. No complaint of orthopnea, PND, syncope, or palpitations. Appetite is not as good as before. Denies headache, bleeding, and diaphoresis.   Past Medical History:  Diagnosis Date  . Diabetes mellitus without complication ([place])   . Fall at home Sept. 3, 2016   Fx  3 ribs  . Hypertension   . Ileus, postoperative ([place]) 12/17/2012  . Irregular heart beat   . Renal disorder     Past Surgical History:  Procedure Laterality Date  . CATARACT EXTRACTION, BILATERAL    . HERNIA REPAIR    . I&D EXTREMITY Right 05/19/2016   Procedure: MINOR IRRIGATION AND DEBRIDEMENT Right great toe;  Surgeon: Gaynelle Arabian, MD;  [facility];  Service: Orthopedics;  Laterality: Right;  . INSERTION OF MESH N/A 12/12/2012   Procedure: INSERTION OF MESH;  Surgeon: Madilyn Hook, DO;  Location[facility];  Service: General;  Laterality: N/A;  . LAPAROSCOPIC LYSIS OF ADHESIONS N/A 12/12/2012   Procedure: LAPAROSCOPIC LYSIS OF ADHESIONS;  Surgeon: Madilyn Hook, DO;  [facility];  Service: General;  Laterality: N/A;  . Right Lumpectomy    . VENTRAL HERNIA REPAIR N/A 12/12/2012   Procedure: LAPAROSCOPIC VENTRAL HERNIA;  Surgeon: Madilyn Hook, DO;  [facility];  Service: General;  Laterality: N/A;    Current Medications: Outpatient Medications Prior to Visit  Medication Sig Dispense Refill  . acidophilus (RISAQUAD) CAPS capsule Take 1 capsule by mouth daily as needed (GI issues).     Marland Kitchen  amiodarone (PACERONE) 200 MG tablet Take 0.5 tablets (100 mg total) by mouth daily. 90 tablet 2  . amLODipine (NORVASC) 10 MG tablet Take 5 mg by mouth daily at 12 noon.   1  . atorvastatin (LIPITOR) 40 MG tablet Take 40 mg by mouth every evening.     . B-D UF III MINI PEN NEEDLES 31G X 5 MM MISC USE 3 PEN NEEDLES PER DAY 100 each 1  . colchicine 0.6 MG tablet Take 0.6 mg by mouth daily.     . Cranberry 250 MG CAPS Take 750 mg by mouth daily.     Marland Kitchen econazole nitrate 1 % cream Apply 1 application topically daily as needed (to groin rash).     Marland Kitchen esomeprazole (NEXIUM) 20 MG capsule Take 20 mg by mouth daily before breakfast.     . ferrous sulfate 325 (65 FE) MG tablet Take 325 mg by mouth daily with breakfast.    . fluticasone (FLONASE) 50 MCG/ACT nasal spray Place 2 sprays into both nostrils daily as needed for rhinitis.     . furosemide (LASIX) 80 MG tablet Take 40-80 mg by mouth 2 (two) times daily. Pt takes one tablet in the morning and one-half tablet in the evening.    Marland Kitchen HYDROcodone-acetaminophen (NORCO/VICODIN) 5-325 MG tablet Take 1-2 tablets by mouth every 4 (four) hours as needed (breakthrough pain). 30 tablet 0  . metolazone (ZAROXOLYN) 5 MG tablet Take 5  mg by mouth daily as needed (for edema).   3  . metoprolol (LOPRESSOR) 50 MG tablet Take 50 mg by mouth 2 (two) times daily.    . polyethylene glycol (MIRALAX / GLYCOLAX) packet Take 17 g by mouth daily as needed for mild constipation.    . potassium chloride (KLOR-CON 10) 10 MEQ tablet Take 3 tablets in the am and 3 tablets in the pm (Patient taking differently: Take 30 mEq by mouth 2 (two) times daily. ) 180 tablet 3  . traMADol (ULTRAM) 50 MG tablet Take 1 tablet (50 mg total) by mouth every 6 (six) hours as needed for moderate pain or severe pain. 30 tablet 0  . VICTOZA 18 MG/3ML SOPN INJECT 1.2MG  SUBCUTANEOUSLY DAILY (Patient taking differently: INJECT 1.2MG  SUBCUTANEOUSLY AT BEDTIME) 6 pen 2  . warfarin (COUMADIN) 5 MG tablet TAKE AS  DIRECTED BY COUMADIN CLINIC FOR 1 MONTH 30 tablet 3  . HUMALOG KWIKPEN 100 UNIT/ML KiwkPen INJECT 7 UNITS SUBCUTANEOUSLY IN THE MORNING AND 8 UNITS IN THE EVENING (Patient not taking: Reported on 07/23/2016) 15 mL 2   No facility-administered medications prior to visit.      Allergies:   Lotensin [benazepril hcl]; Zantac [ranitidine hcl]; and Tape   Social History   Social History  . Marital status: Married    Spouse name: N/A  . Number of children: N/A  . Years of education: N/A   Social History Main Topics  . Smoking status: Former Smoker    Types: Cigarettes  . Smokeless tobacco: Never Used  . Alcohol use No  . Drug use: No  . Sexual activity: No   Other Topics Concern  . None   Social History Narrative  . None     Family History:  The patient's family history includes Diabetes in her mother and sister.   ROS:   Please see the history of present illness.    Decreased taste. Weight loss.  All other systems reviewed and are negative.   PHYSICAL EXAM:   VS:  BP 140/60 (BP Location: Right Arm)   Pulse 62   Ht 5\' 5"  (1.651 m)   Wt 176 lb (79.8 kg)   BMI 29.29 kg/m    Developing chronic illness appearance. GEN: Well nourished, well developed, in no acute distress  HEENT: normal  Neck: no JVD, carotid bruits, or masses Cardiac: RRR; no murmurs, rubs, or gallops. Left lower extremity edema. Bandage over the right great toe. Respiratory:  clear to auscultation bilaterally, normal work of breathing GI: soft, nontender, nondistended, + BS MS: no deformity or atrophy  Skin: warm and dry, no rash Neuro:  Alert and Oriented x 3, Strength and sensation are intact Psych: euthymic mood, full affect  Wt Readings from Last 3 Encounters:  07/23/16 176 lb (79.8 kg)  07/18/16 182 lb (82.6 kg)  05/19/16 190 lb 4.1 oz (86.3 kg)      Studies/Labs Reviewed:   EKG:  EKG  Not repeated. Last tracing performed in October reveals sinus bradycardia.  Recent Labs: 12/25/2015: TSH  1.41 05/22/2016: Hemoglobin 11.1; Platelets 266 07/18/2016: ALT 17; BUN 31; Creatinine, Ser 1.49; Potassium 3.8; Sodium 139   Lipid Panel    Component Value Date/Time   CHOL 112 07/18/2016 1005   TRIG 111.0 07/18/2016 1005   HDL 53.70 07/18/2016 1005   CHOLHDL 2 07/18/2016 1005   VLDL 22.2 07/18/2016 1005   LDLCALC 36 07/18/2016 1005   LDLDIRECT 37.8 10/27/2013 0808    Additional studies/ records that  were reviewed today include:  Recent liver panel performed by Dr. Dwyane Dee is normal. LDL cholesterol 46. Liver panel was unremarkable. No recent chest x-ray.    ASSESSMENT:    1. Paroxysmal atrial fibrillation ([facility])   2. Chronic diastolic heart failure ([place])   3. Long term current use of amiodarone   4. Essential hypertension   5. Diabetes mellitus due to underlying condition with stage 4 chronic kidney disease, without long-term current use of insulin ([facility])      PLAN:  In order of problems listed above:  1. Asymptomatic on low dose amiodarone. No recent documented recurrences of atrial fibrillation. 2. No evidence of volume overload. Neck veins are flat. No change in therapy. 3. Low-dose amiodarone without evidence of liver toxicity on recent blood work. TSH and chest x-ray need to be performed soon, and a TSH and liver panel will be repeated in 6 months on return. 4. Excellent blood pressure control. 2 g sodium diet. 5. Followed by Dr. Dwyane Dee. Right great toe ulcer is being managed by Dr. Wynelle Link.  Clinical follow-up in 6 months. Call of chest discomfort, dyspnea, or fluid retention.  Medication Adjustments/Labs and Tests Ordered: Current medicines are reviewed at length with the patient today.  Concerns regarding medicines are outlined above.  Medication changes, Labs and Tests ordered today are listed in the Patient Instructions below. Patient Instructions  Medication Instructions:  None  Labwork: TSH today  Testing/Procedures: A chest x-ray takes a picture of the  organs and structures inside the chest, including the heart, lungs, and blood vessels. This test can show several things, including, whether the heart is enlarges; whether fluid is building up in the lungs; and whether pacemaker / defibrillator leads are still in place.   Follow-Up: Your physician wants you to follow-up in: 6 months with Dr. Tamala Julian.  You will receive a reminder letter in the mail two months in advance. If you don't receive a letter, please call our office to schedule the follow-up appointment.   Any Other Special Instructions Will Be Listed Below (If Applicable).     If you need a refill on your cardiac medications before your next appointment, please call your pharmacy.      Signed, Sinclair Grooms, MD  07/23/2016 9:51 AM    [facility] [address], [place], [place]  [address] Phone: [phone]; Fax: [phone]

## 2016-07-23 ENCOUNTER — Ambulatory Visit
Admission: RE | Admit: 2016-07-23 | Discharge: 2016-07-23 | Disposition: A | Discharge status: Home / Self Care | Payer: Medicare Other | Source: Ambulatory Visit | Attending: Interventional Cardiology | Admitting: Interventional Cardiology

## 2016-07-23 ENCOUNTER — Encounter: Payer: Self-pay | Admitting: Interventional Cardiology

## 2016-07-23 ENCOUNTER — Ambulatory Visit (INDEPENDENT_AMBULATORY_CARE_PROVIDER_SITE_OTHER): Payer: Medicare Other | Admitting: Interventional Cardiology

## 2016-07-23 VITALS — BP 140/60 | HR 62 | Ht 65.0 in | Wt 176.0 lb

## 2016-07-23 DIAGNOSIS — E0822 Diabetes mellitus due to underlying condition with diabetic chronic kidney disease: Secondary | ICD-10-CM | POA: Diagnosis not present

## 2016-07-23 DIAGNOSIS — R918 Other nonspecific abnormal finding of lung field: Secondary | ICD-10-CM | POA: Diagnosis not present

## 2016-07-23 DIAGNOSIS — I10 Essential (primary) hypertension: Secondary | ICD-10-CM

## 2016-07-23 DIAGNOSIS — Z79899 Other long term (current) drug therapy: Secondary | ICD-10-CM

## 2016-07-23 DIAGNOSIS — I5032 Chronic diastolic (congestive) heart failure: Secondary | ICD-10-CM

## 2016-07-23 DIAGNOSIS — I480 Paroxysmal atrial fibrillation: Secondary | ICD-10-CM | POA: Diagnosis not present

## 2016-07-23 DIAGNOSIS — N184 Chronic kidney disease, stage 4 (severe): Secondary | ICD-10-CM

## 2016-07-23 LAB — TSH: TSH: 1.85 u[IU]/mL (ref 0.450–4.500)

## 2016-07-23 NOTE — Patient Instructions (Signed)
Medication Instructions:  None  Labwork: TSH today  Testing/Procedures: A chest x-ray takes a picture of the organs and structures inside the chest, including the heart, lungs, and blood vessels. This test can show several things, including, whether the heart is enlarges; whether fluid is building up in the lungs; and whether pacemaker / defibrillator leads are still in place.   Follow-Up: Your physician wants you to follow-up in: 6 months with Dr. Tamala Julian.  You will receive a reminder letter in the mail two months in advance. If you don't receive a letter, please call our office to schedule the follow-up appointment.   Any Other Special Instructions Will Be Listed Below (If Applicable).     If you need a refill on your cardiac medications before your next appointment, please call your pharmacy.

## 2016-07-28 DIAGNOSIS — E119 Type 2 diabetes mellitus without complications: Secondary | ICD-10-CM | POA: Diagnosis not present

## 2016-08-05 LAB — POCT INR: INR: 2.9

## 2016-08-07 ENCOUNTER — Ambulatory Visit (INDEPENDENT_AMBULATORY_CARE_PROVIDER_SITE_OTHER): Payer: Medicare Other | Admitting: Cardiology

## 2016-08-07 DIAGNOSIS — D631 Anemia in chronic kidney disease: Secondary | ICD-10-CM | POA: Diagnosis not present

## 2016-08-07 DIAGNOSIS — Z6832 Body mass index (BMI) 32.0-32.9, adult: Secondary | ICD-10-CM | POA: Diagnosis not present

## 2016-08-07 DIAGNOSIS — Z5181 Encounter for therapeutic drug level monitoring: Secondary | ICD-10-CM

## 2016-08-07 DIAGNOSIS — I480 Paroxysmal atrial fibrillation: Secondary | ICD-10-CM

## 2016-08-07 DIAGNOSIS — N2581 Secondary hyperparathyroidism of renal origin: Secondary | ICD-10-CM | POA: Diagnosis not present

## 2016-08-07 DIAGNOSIS — N183 Chronic kidney disease, stage 3 (moderate): Secondary | ICD-10-CM | POA: Diagnosis not present

## 2016-08-14 ENCOUNTER — Telehealth: Payer: Self-pay | Admitting: Endocrinology

## 2016-08-15 ENCOUNTER — Other Ambulatory Visit: Payer: Self-pay | Admitting: Interventional Cardiology

## 2016-08-15 NOTE — Telephone Encounter (Signed)
Please advise if okay to refill. Last filled by Daneen Schick MD in 12/2015.

## 2016-08-15 NOTE — Telephone Encounter (Signed)
Please request this to be filled by Dr. Tamala Julian

## 2016-08-25 NOTE — Addendum Note (Signed)
Addendum  created 08/25/16 1135 by Myrtie Soman, MD   Sign clinical note

## 2016-09-02 DIAGNOSIS — E1122 Type 2 diabetes mellitus with diabetic chronic kidney disease: Secondary | ICD-10-CM | POA: Diagnosis not present

## 2016-09-02 DIAGNOSIS — I10 Essential (primary) hypertension: Secondary | ICD-10-CM | POA: Diagnosis not present

## 2016-09-02 DIAGNOSIS — N184 Chronic kidney disease, stage 4 (severe): Secondary | ICD-10-CM | POA: Diagnosis not present

## 2016-09-02 DIAGNOSIS — I480 Paroxysmal atrial fibrillation: Secondary | ICD-10-CM | POA: Diagnosis not present

## 2016-09-15 ENCOUNTER — Other Ambulatory Visit: Payer: Self-pay | Admitting: Family Medicine

## 2016-09-15 DIAGNOSIS — Z1231 Encounter for screening mammogram for malignant neoplasm of breast: Secondary | ICD-10-CM

## 2016-09-17 ENCOUNTER — Encounter: Payer: Self-pay | Admitting: Endocrinology

## 2016-09-17 ENCOUNTER — Ambulatory Visit (INDEPENDENT_AMBULATORY_CARE_PROVIDER_SITE_OTHER): Payer: Medicare Other | Admitting: *Deleted

## 2016-09-17 ENCOUNTER — Ambulatory Visit (INDEPENDENT_AMBULATORY_CARE_PROVIDER_SITE_OTHER): Payer: Medicare Other | Admitting: Endocrinology

## 2016-09-17 VITALS — BP 124/64 | HR 61 | Ht 65.0 in | Wt 183.4 lb

## 2016-09-17 DIAGNOSIS — Z794 Long term (current) use of insulin: Secondary | ICD-10-CM | POA: Diagnosis not present

## 2016-09-17 DIAGNOSIS — I4891 Unspecified atrial fibrillation: Secondary | ICD-10-CM | POA: Diagnosis not present

## 2016-09-17 DIAGNOSIS — Z5181 Encounter for therapeutic drug level monitoring: Secondary | ICD-10-CM

## 2016-09-17 DIAGNOSIS — E1165 Type 2 diabetes mellitus with hyperglycemia: Secondary | ICD-10-CM

## 2016-09-17 LAB — POCT INR: INR: 1.7

## 2016-09-17 LAB — GLUCOSE, POCT (MANUAL RESULT ENTRY): POC GLUCOSE: 138 mg/dL — AB (ref 70–99)

## 2016-09-17 NOTE — Patient Instructions (Signed)
Reduce to 4 at supper  Check blood sugars on waking up  weekly  Also check blood sugars about 2 hours after a meal and do this after different meals by rotation  Recommended blood sugar levels on waking up is 90-130 and about 2 hours after meal is 130-160  Please bring your blood sugar monitor to each visit, thank you

## 2016-09-17 NOTE — Progress Notes (Signed)
Patient ID: [name], female   DOB: 12-Aug-1933, 83 y.o.   MRN: [id number]    Reason for Appointment: Follow-up for Type 2 Diabetes  Referring physician: Jordan Hawks Rankins  History of Present Illness:          Diagnosis: Type 2 diabetes mellitus, date of diagnosis: ? 20 years ago        Past history: She was initially treated with Metformin but this was done because it caused GI side effects  May also tried Actos at some point, probably started because of the fear of side effects but did not know if it helped Actos stopped ? Reason Over the last few years she has been taking Precose with meals and also Januvia in increasing doses Apparently her blood sugars have been higher since 9/14 when she had abdominal surgery and before that they were as low as 70. On her initial consultation in 6/15 because of her renal dysfunction her medication regimen was changed. Previously was taking Januvia, Precose and glipizide without adequate control; her blood sugars were occasionally over 200 fasting and A1c was 9.4 She was started on Victoza 0.6 mg and Lantus insulin 8 units a day but the Lantus had to be stopped because of relatively low fasting glucose.  Since she had tried Prandin and glipizide and her postprandial readings were as high as 330 she was started on Humalog at mealtimes in 8/15  Recent history:   INSULIN regimen: Humalog 7 units U acb, 5  acs  She has been taking small doses of Humalog  at breakfast and supper  Also taking Victoza at bedtime   Her A1c has come down to 6.4 in April, previously as high as 8%  Current blood sugar patterns, current management  and problems identified:  She was advised to try Victoza in the morning since she would not take it consistently at night for fear of hypoglycemia but she thinks that taking it in the morning made her sugar relatively low in the afternoon  She thinks she is taking her Victoza more consistently at night  Again her  blood sugars are relatively low at bedtime without any hypoglycemia even with reducing her Humalog down to 5 units  No hypoglycemia reported, she thinks she will feel lightheaded when the blood sugar is about 69  She is usually eating a relatively high fat breakfast but blood sugar postprandially was only 167 at home and 138 today  Although she is not eating much at lunchtime she does not check readings in the afternoons  Fasting readings: She has about 3 of these which are fairly consistently near normal  She has a relatively large breakfast, sometimes high in fat also  Usually eating a 1/2 sandwich at lunch, sometimes less       Oral hypoglycemic drugs the patient is taking are: none  Side effects from medications have been: None  Glucose monitoring:  done once  a day        Glucometer:  One Touch Verio Readings by download  Mean values apply above for all meters except median for One Touch  PRE-MEAL Fasting Lunch Dinner Bedtime Overall  Glucose range: 101-112    82-2 07    Mean/median:    112 112     Self-care:   Meals: 3 meals per day.   Breakfast: eggs, grits,meat;  sometimes 1/2 Boost; will have spaghetti or rice at supper. Dinner 6 pm  Exercise:  none, unable to         Dietician visit: Most recent: Unknown.                Weight history:  Wt Readings from Last 3 Encounters:  09/17/16 183 lb 6.4 oz (83.2 kg)  07/23/16 176 lb (79.8 kg)  07/18/16 182 lb (82.6 kg)   Glycemic control:  Lab Results  Component Value Date   HGBA1C 6.4 07/18/2016   HGBA1C 6.9 04/23/2016   HGBA1C 8.0 (H) 12/11/2015   Lab Results  Component Value Date   MICROALBUR 0.7 09/11/2015   LDLCALC 36 07/18/2016   CREATININE 1.49 (H) 07/18/2016     Allergies as of 09/17/2016      Reactions   Lotensin [benazepril Hcl] Anaphylaxis   Zantac [ranitidine Hcl] Anaphylaxis   Tape Itching, Rash      Medication List       Accurate as of 09/17/16  8:38 AM. Always use your most recent  med list.          acidophilus Caps capsule Take 1 capsule by mouth daily as needed (GI issues).   amiodarone 200 MG tablet Commonly known as:  PACERONE Take 0.5 tablets (100 mg total) by mouth daily.   amLODipine 10 MG tablet Commonly known as:  NORVASC Take 5 mg by mouth daily at 12 noon.   atorvastatin 40 MG tablet Commonly known as:  LIPITOR Take 40 mg by mouth every evening.   B-D UF III MINI PEN NEEDLES 31G X 5 MM Misc Generic drug:  Insulin Pen Needle USE 3 PEN NEEDLES PER DAY   colchicine 0.6 MG tablet Take 0.6 mg by mouth daily.   Cranberry 250 MG Caps Take 750 mg by mouth daily.   econazole nitrate 1 % cream Apply 1 application topically daily as needed (to groin rash).   esomeprazole 20 MG capsule Commonly known as:  NEXIUM Take 20 mg by mouth daily before breakfast.   ferrous sulfate 325 (65 FE) MG tablet Take 325 mg by mouth daily with breakfast.   fluticasone 50 MCG/ACT nasal spray Commonly known as:  FLONASE Place 2 sprays into both nostrils daily as needed for rhinitis.   furosemide 80 MG tablet Commonly known as:  LASIX Take 40-80 mg by mouth 2 (two) times daily. Pt takes one tablet in the morning and one-half tablet in the evening.   HUMALOG KWIKPEN 100 UNIT/ML KiwkPen Generic drug:  insulin lispro Inject 7 units into the skin each morning and 5 units into the skin each evening.   HYDROcodone-acetaminophen 5-325 MG tablet Commonly known as:  NORCO/VICODIN Take 1-2 tablets by mouth every 4 (four) hours as needed (breakthrough pain).   metolazone 5 MG tablet Commonly known as:  ZAROXOLYN Take 5 mg by mouth daily as needed (for edema).   metoprolol tartrate 50 MG tablet Commonly known as:  LOPRESSOR Take 50 mg by mouth 2 (two) times daily.   polyethylene glycol packet Commonly known as:  MIRALAX / GLYCOLAX Take 17 g by mouth daily as needed for mild constipation.   potassium chloride 10 MEQ tablet Commonly known as:  KLOR-CON  10 Take 3 tablets in the am and 3 tablets in the pm   traMADol 50 MG tablet Commonly known as:  ULTRAM Take 1 tablet (50 mg total) by mouth every 6 (six) hours as needed for moderate pain or severe pain.   VICTOZA 18 MG/3ML Sopn Generic drug:  liraglutide INJECT 1.2MG  SUBCUTANEOUSLY DAILY  warfarin 5 MG tablet Commonly known as:  COUMADIN TAKE AS DIRECTED BY COUMADIN CLINIC FOR 1 MONTH       Allergies:  Allergies  Allergen Reactions  . Lotensin [Benazepril Hcl] Anaphylaxis  . Zantac [Ranitidine Hcl] Anaphylaxis  . Tape Itching and Rash    Past Medical History:  Diagnosis Date  . Diabetes mellitus without complication ([place])   . Fall at home Sept. 3, 2016   Fx  3 ribs  . Hypertension   . Ileus, postoperative ([place]) 12/17/2012  . Irregular heart beat   . Renal disorder     Past Surgical History:  Procedure Laterality Date  . CATARACT EXTRACTION, BILATERAL    . HERNIA REPAIR    . I&D EXTREMITY Right 05/19/2016   Procedure: MINOR IRRIGATION AND DEBRIDEMENT Right great toe;  Surgeon: Gaynelle Arabian, MD;  Location: [facility];  Service: Orthopedics;  Laterality: Right;  . INSERTION OF MESH N/A 12/12/2012   Procedure: INSERTION OF MESH;  Surgeon: Madilyn Hook, DO;  Location: [facility];  Service: General;  Laterality: N/A;  . LAPAROSCOPIC LYSIS OF ADHESIONS N/A 12/12/2012   Procedure: LAPAROSCOPIC LYSIS OF ADHESIONS;  Surgeon: Madilyn Hook, DO;  Location: [facility];  Service: General;  Laterality: N/A;  . Right Lumpectomy    . VENTRAL HERNIA REPAIR N/A 12/12/2012   Procedure: LAPAROSCOPIC VENTRAL HERNIA;  Surgeon: Madilyn Hook, DO;  Location: [facility];  Service: General;  Laterality: N/A;    Family History  Problem Relation Age of Onset  . Diabetes Mother   . Diabetes Sister     Social History:  reports that she has quit smoking. Her smoking use included Cigarettes. She has never used smokeless tobacco. She reports that she does not drink alcohol or use drugs.    Review of Systems         Lipids: She has been on high dose Lipitor for hyperlipidemia Has not had any labs from PCP      Lab Results  Component Value Date   CHOL 112 07/18/2016   HDL 53.70 07/18/2016   LDLCALC 36 07/18/2016   LDLDIRECT 37.8 10/27/2013   TRIG 111.0 07/18/2016   CHOLHDL 2 07/18/2016       The blood pressure has been treated with multiple medications including 10 mg amlodipine  Followed by other physicians Including nephrologist   BP Readings from Last 3 Encounters:  09/17/16 124/64  07/23/16 140/60  07/18/16 138/70     On amiodarone, This has not caused any change in thyroid functions   Lab Results  Component Value Date   TSH 1.850 07/23/2016       She has had history of swelling of legs and is on Lasix 1 1/2 tabs; managed by PCP and nephrologist      Chronic kidney disease followed by nephrologist regularly   Lab Results  Component Value Date   CREATININE 1.49 (H) 07/18/2016   Last diabetic foot exam in 2/17     She has weakness in her legs since 2014; needs a walker and cannot walk Much  Physical Examination:  BP 124/64   Pulse 61   Ht 5\' 5"  (1.651 m)   Wt 183 lb 6.4 oz (83.2 kg)   SpO2 98%   BMI 30.52 kg/m     ASSESSMENT:  Diabetes type 2, uncontrolled   See history of present illness for detailed discussion of his current management, blood sugar patterns and problems identified  She has Excellent control of her blood sugars with mostly near  normal readings although checking mostly at bedtime Last A1c 6.4 Considering her age her bedtime readings may be low normal at times Does not appear to have unusually high or low readings after breakfast and a few fasting readings she has are consistently near normal No hypoglycemia  RENAL dysfunction: Followed by nephrologist and PCP, needs follow-up labs  today to make sure she is not hypokalemic  HYPERTENSION: Her blood pressure is  good  PLAN:  She will reduce her suppertime dose to 4 units Humalog  but may also consider stopping it completely Start checking some readings after breakfast and also some in the afternoon She will check fasting readings at least weekly Continue Victoza at bedtime  s  There are no Patient Instructions on file for this visit.   Chani Ghanem 09/17/2016, 8:38 AM   Note: This office note was prepared with Dragon voice recognition system technology. Any transcriptional errors that result from this process are unintentional.

## 2016-09-17 NOTE — Addendum Note (Signed)
Addended by: Anibal Henderson on: 09/17/2016 02:12 PM   Modules accepted: Orders

## 2016-10-01 ENCOUNTER — Ambulatory Visit (INDEPENDENT_AMBULATORY_CARE_PROVIDER_SITE_OTHER): Payer: Medicare Other | Admitting: Pharmacist

## 2016-10-01 ENCOUNTER — Ambulatory Visit
Admission: RE | Admit: 2016-10-01 | Discharge: 2016-10-01 | Disposition: A | Discharge status: Home / Self Care | Payer: Medicare Other | Source: Ambulatory Visit | Attending: Family Medicine | Admitting: Family Medicine

## 2016-10-01 DIAGNOSIS — Z5181 Encounter for therapeutic drug level monitoring: Secondary | ICD-10-CM

## 2016-10-01 DIAGNOSIS — Z1231 Encounter for screening mammogram for malignant neoplasm of breast: Secondary | ICD-10-CM

## 2016-10-01 DIAGNOSIS — I4891 Unspecified atrial fibrillation: Secondary | ICD-10-CM

## 2016-10-01 LAB — POCT INR: INR: 1.5

## 2016-10-14 ENCOUNTER — Ambulatory Visit (INDEPENDENT_AMBULATORY_CARE_PROVIDER_SITE_OTHER): Payer: Medicare Other

## 2016-10-14 ENCOUNTER — Other Ambulatory Visit: Payer: Self-pay | Admitting: Interventional Cardiology

## 2016-10-14 DIAGNOSIS — I4891 Unspecified atrial fibrillation: Secondary | ICD-10-CM

## 2016-10-14 DIAGNOSIS — Z5181 Encounter for therapeutic drug level monitoring: Secondary | ICD-10-CM | POA: Diagnosis not present

## 2016-10-14 LAB — POCT INR: INR: 2.5

## 2016-10-15 ENCOUNTER — Other Ambulatory Visit: Payer: Self-pay | Admitting: Interventional Cardiology

## 2016-10-23 ENCOUNTER — Other Ambulatory Visit: Payer: Self-pay | Admitting: Endocrinology

## 2016-10-23 DIAGNOSIS — E1022 Type 1 diabetes mellitus with diabetic chronic kidney disease: Secondary | ICD-10-CM | POA: Diagnosis not present

## 2016-11-06 ENCOUNTER — Ambulatory Visit (INDEPENDENT_AMBULATORY_CARE_PROVIDER_SITE_OTHER): Payer: Medicare Other | Admitting: *Deleted

## 2016-11-06 DIAGNOSIS — Z5181 Encounter for therapeutic drug level monitoring: Secondary | ICD-10-CM

## 2016-11-06 DIAGNOSIS — I4891 Unspecified atrial fibrillation: Secondary | ICD-10-CM | POA: Diagnosis not present

## 2016-11-06 LAB — POCT INR: INR: 2.4

## 2016-12-04 ENCOUNTER — Ambulatory Visit (INDEPENDENT_AMBULATORY_CARE_PROVIDER_SITE_OTHER): Payer: Medicare Other | Admitting: *Deleted

## 2016-12-04 DIAGNOSIS — Z5181 Encounter for therapeutic drug level monitoring: Secondary | ICD-10-CM

## 2016-12-04 DIAGNOSIS — I4891 Unspecified atrial fibrillation: Secondary | ICD-10-CM

## 2016-12-04 LAB — POCT INR: INR: 1.7

## 2016-12-09 ENCOUNTER — Other Ambulatory Visit: Payer: Self-pay | Admitting: Interventional Cardiology

## 2016-12-16 ENCOUNTER — Other Ambulatory Visit: Payer: Self-pay | Admitting: Endocrinology

## 2016-12-18 ENCOUNTER — Ambulatory Visit: Payer: Medicare Other | Admitting: Endocrinology

## 2016-12-18 ENCOUNTER — Ambulatory Visit (INDEPENDENT_AMBULATORY_CARE_PROVIDER_SITE_OTHER): Payer: Medicare Other | Admitting: *Deleted

## 2016-12-18 ENCOUNTER — Encounter: Payer: Self-pay | Admitting: Endocrinology

## 2016-12-18 ENCOUNTER — Ambulatory Visit (INDEPENDENT_AMBULATORY_CARE_PROVIDER_SITE_OTHER): Payer: Medicare Other | Admitting: Endocrinology

## 2016-12-18 VITALS — BP 148/92 | HR 64 | Ht 65.0 in | Wt 182.0 lb

## 2016-12-18 DIAGNOSIS — Z23 Encounter for immunization: Secondary | ICD-10-CM | POA: Diagnosis not present

## 2016-12-18 DIAGNOSIS — Z5181 Encounter for therapeutic drug level monitoring: Secondary | ICD-10-CM

## 2016-12-18 DIAGNOSIS — Z794 Long term (current) use of insulin: Secondary | ICD-10-CM

## 2016-12-18 DIAGNOSIS — E1165 Type 2 diabetes mellitus with hyperglycemia: Secondary | ICD-10-CM

## 2016-12-18 DIAGNOSIS — I4891 Unspecified atrial fibrillation: Secondary | ICD-10-CM

## 2016-12-18 LAB — COMPREHENSIVE METABOLIC PANEL
ALK PHOS: 37 U/L — AB (ref 39–117)
ALT: 20 U/L (ref 0–35)
AST: 26 U/L (ref 0–37)
Albumin: 3.4 g/dL — ABNORMAL LOW (ref 3.5–5.2)
BILIRUBIN TOTAL: 0.5 mg/dL (ref 0.2–1.2)
BUN: 30 mg/dL — ABNORMAL HIGH (ref 6–23)
CALCIUM: 9.3 mg/dL (ref 8.4–10.5)
CO2: 31 mEq/L (ref 19–32)
Chloride: 105 mEq/L (ref 96–112)
Creatinine, Ser: 1.63 mg/dL — ABNORMAL HIGH (ref 0.40–1.20)
GFR: 38.93 mL/min — AB (ref >60.00)
Glucose, Bld: 66 mg/dL — ABNORMAL LOW (ref 70–99)
POTASSIUM: 3.5 meq/L (ref 3.5–5.1)
Sodium: 142 mEq/L (ref 135–145)
TOTAL PROTEIN: 6.3 g/dL (ref 6.0–8.3)

## 2016-12-18 LAB — POCT INR: INR: 2.5

## 2016-12-18 LAB — POCT GLYCOSYLATED HEMOGLOBIN (HGB A1C): Hemoglobin A1C: 5.8

## 2016-12-18 NOTE — Patient Instructions (Addendum)
Check blood sugars on waking up  2-3/7 days  Also check blood sugars about 2 hours after a meal and do this after different meals by rotation  Recommended blood sugar levels on waking up is 90-130 and about 2 hours after meal is 130-160  Please bring your blood sugar monitor to each visit, thank you  No Humalog at dinner  Take Victoza in am

## 2016-12-18 NOTE — Progress Notes (Signed)
Patient ID: [name], female   DOB: February 06, 1934, 83 y.o.   MRN: [id number]    Reason for Appointment: Follow-up for Type 2 Diabetes  Referring physician: Jordan Hawks Rankins  History of Present Illness:          Diagnosis: Type 2 diabetes mellitus, date of diagnosis: ? 5 years ago        Past history: She was initially treated with Metformin but this was done because it caused GI side effects  May also tried Actos at some point, probably started because of the fear of side effects but did not know if it helped Actos stopped ? Reason Over the last few years she has been taking Precose with meals and also Januvia in increasing doses Apparently her blood sugars have been higher since 9/14 when she had abdominal surgery and before that they were as low as 70. On her initial consultation in 6/15 because of her renal dysfunction her medication regimen was changed. Previously was taking Januvia, Precose and glipizide without adequate control; her blood sugars were occasionally over 200 fasting and A1c was 9.4 She was started on Victoza 0.6 mg and Lantus insulin 8 units a day but the Lantus had to be stopped because of relatively low fasting glucose.  Since she had tried Prandin and glipizide and her postprandial readings were as high as 330 she was started on Humalog at mealtimes in 8/15  Recent history:   INSULIN regimen: Humalog 7 units U acb, 5  acs  She has been taking small doses of Humalog  at breakfast and supper  Also taking Victoza at bedtime   Her A1c has come down to 5.8, previously 6.4 in April,  Current blood sugar patterns, current management  and problems identified:  She was advised to cut back on her Humalog to 4 units at suppertime but she is still taking 5 units  This is despite her blood sugars reportedly being near normal at bedtime usually  She has had a couple of readings in the 70s at bedtime, she thinks she may feel weaker at that time  Again she  says she will not take her Victoza if her blood sugar is low normal at bedtime and then her blood sugar may go up after a couple of days  Again forgetting to check her blood sugars in the morning unless blood sugars have been low the night before  She does not check her sugars after breakfast and also does not think she feels hypoglycemic after the morning injection  She forgot her meter today  She has a relatively large breakfast, sometimes high in fat also  Usually eating a 1/2 sandwich at lunch, sometimes less       Oral hypoglycemic drugs the patient is taking are: none  Side effects from medications have been: None  Glucose monitoring:  done once  a day        Glucometer:  One Touch Verio Readings by recall:  Am 116  Hs 71-117 (once 185)   Self-care:   Meals: 3 meals per day.   Breakfast: eggs, grits,meat;  sometimes 1/2 Boost; will have spaghetti or rice at supper. Dinner 6 pm           Exercise:  none, unable to         Dietician visit: Most recent: Unknown.                Weight history:  Wt Readings from Last 3 Encounters:  12/18/16 182 lb (82.6 kg)  09/17/16 183 lb 6.4 oz (83.2 kg)  07/23/16 176 lb (79.8 kg)   Glycemic control:  Lab Results  Component Value Date   HGBA1C 5.8 12/18/2016   HGBA1C 6.4 07/18/2016   HGBA1C 6.9 04/23/2016   Lab Results  Component Value Date   MICROALBUR 0.7 09/11/2015   LDLCALC 36 07/18/2016   CREATININE 1.63 (H) 12/18/2016     Allergies as of 12/18/2016      Reactions   Lotensin [benazepril Hcl] Anaphylaxis   Zantac [ranitidine Hcl] Anaphylaxis   Tape Itching, Rash      Medication List       Accurate as of 12/18/16  9:00 PM. Always use your most recent med list.          acidophilus Caps capsule Take 1 capsule by mouth daily as needed (GI issues).   amiodarone 200 MG tablet Commonly known as:  PACERONE Take 0.5 tablets (100 mg total) by mouth daily.   amLODipine 10 MG tablet Commonly known as:  NORVASC Take  5 mg by mouth daily at 12 noon.   atorvastatin 40 MG tablet Commonly known as:  LIPITOR Take 40 mg by mouth every evening.   B-D UF III MINI PEN NEEDLES 31G X 5 MM Misc Generic drug:  Insulin Pen Needle USE 3 PEN NEEDLES PER DAY   colchicine 0.6 MG tablet Take 0.6 mg by mouth daily.   Cranberry 250 MG Caps Take 750 mg by mouth daily.   econazole nitrate 1 % cream Apply 1 application topically daily as needed (to groin rash).   esomeprazole 20 MG capsule Commonly known as:  NEXIUM Take 20 mg by mouth daily before breakfast.   ferrous sulfate 325 (65 FE) MG tablet Take 325 mg by mouth daily with breakfast.   fluticasone 50 MCG/ACT nasal spray Commonly known as:  FLONASE Place 2 sprays into both nostrils daily as needed for rhinitis.   furosemide 80 MG tablet Commonly known as:  LASIX Take 40-80 mg by mouth 2 (two) times daily. Pt takes one tablet in the morning and one-half tablet in the evening.   HUMALOG KWIKPEN 100 UNIT/ML KiwkPen Generic drug:  insulin lispro Inject 7 units into the skin each morning and 5 units into the skin each evening.   HYDROcodone-acetaminophen 5-325 MG tablet Commonly known as:  NORCO/VICODIN Take 1-2 tablets by mouth every 4 (four) hours as needed (breakthrough pain).   KLOR-CON 10 10 MEQ tablet Generic drug:  potassium chloride TAKE 3 TABLETS BY MOUTH EVERY MORNING AND TAKE 3 TABLETS BY MOUTH EVERY EVENING   metolazone 5 MG tablet Commonly known as:  ZAROXOLYN Take 5 mg by mouth daily as needed (for edema).   metoprolol tartrate 50 MG tablet Commonly known as:  LOPRESSOR Take 50 mg by mouth 2 (two) times daily.   ONETOUCH VERIO test strip Generic drug:  glucose blood USE ONE STRIP TO CHECK GLUCOSE TWICE DAILY AS DIRECTED   polyethylene glycol packet Commonly known as:  MIRALAX / GLYCOLAX Take 17 g by mouth daily as needed for mild constipation.   VICTOZA 18 MG/3ML Sopn Generic drug:  liraglutide INJECT 1.2MG  SUBCUTANEOUSLY  DAILY   warfarin 5 MG tablet Commonly known as:  COUMADIN Take as directed by Coumadin Clinic   warfarin 5 MG tablet Commonly known as:  COUMADIN Take 1 tablet daily or as directed by Coumadin clinic            Discharge Care Instructions  Start     Ordered   12/18/16 0000  POCT glycosylated hemoglobin (Hb A1C)     12/18/16 1459   12/18/16 0000  Flu vaccine HIGH DOSE PF     12/18/16 1501   07/18/16 0000  Comprehensive metabolic panel     74/25/95 1528      Allergies:  Allergies  Allergen Reactions  . Lotensin [Benazepril Hcl] Anaphylaxis  . Zantac [Ranitidine Hcl] Anaphylaxis  . Tape Itching and Rash    Past Medical History:  Diagnosis Date  . Diabetes mellitus without complication ([place])   . Fall at home Sept. 3, 2016   Fx  3 ribs  . Hypertension   . Ileus, postoperative ([place]) 12/17/2012  . Irregular heart beat   . Renal disorder     Past Surgical History:  Procedure Laterality Date  . BREAST LUMPECTOMY Right 2001   2 lumpectomies with radiation both 2001  . CATARACT EXTRACTION, BILATERAL    . HERNIA REPAIR    . I&D EXTREMITY Right 05/19/2016   Procedure: MINOR IRRIGATION AND DEBRIDEMENT Right great toe;  Surgeon: Gaynelle Arabian, MD;  Location: [facility];  Service: Orthopedics;  Laterality: Right;  . INSERTION OF MESH N/A 12/12/2012   Procedure: INSERTION OF MESH;  Surgeon: Madilyn Hook, DO;  Location: [facility];  Service: General;  Laterality: N/A;  . LAPAROSCOPIC LYSIS OF ADHESIONS N/A 12/12/2012   Procedure: LAPAROSCOPIC LYSIS OF ADHESIONS;  Surgeon: Madilyn Hook, DO;  Location: [facility];  Service: General;  Laterality: N/A;  . Right Lumpectomy    . VENTRAL HERNIA REPAIR N/A 12/12/2012   Procedure: LAPAROSCOPIC VENTRAL HERNIA;  Surgeon: Madilyn Hook, DO;  Location: [facility];  Service: General;  Laterality: N/A;    Family History  Problem Relation Age of Onset  . Diabetes Mother   . Diabetes Sister     Social History:  reports that she has quit smoking.  Her smoking use included Cigarettes. She has never used smokeless tobacco. She reports that she does not drink alcohol or use drugs.    Review of Systems        Lipids: She has been on high dose Lipitor for hyperlipidemia      Lab Results  Component Value Date   CHOL 112 07/18/2016   HDL 53.70 07/18/2016   LDLCALC 36 07/18/2016   LDLDIRECT 37.8 10/27/2013   TRIG 111.0 07/18/2016   CHOLHDL 2 07/18/2016       The blood pressure has been treated with multiple medications including 10 mg amlodipine  Followed by other physicians Including nephrologist   BP Readings from Last 3 Encounters:  12/18/16 (!) 148/92  09/17/16 124/64  07/23/16 140/60     On amiodarone, This has not caused any change in thyroid functions   Lab Results  Component Value Date   TSH 1.850 07/23/2016       She has had history of swelling of legs and is on Lasix 1 1/2 tabs; managed by PCP and nephrologist      Chronic kidney disease followed by nephrologist  Every 6 months now   Lab Results  Component Value Date   CREATININE 1.63 (H) 12/18/2016   Last diabetic foot exam in 2/17     She has weakness in her legs since 2014; needs a walker and cannot walk Much  Physical Examination:  BP (!) 148/92   Pulse 64   Ht 5\' 5"  (1.651 m)   Wt 182 lb (82.6 kg)   SpO2 98%   BMI  30.29 kg/m     ASSESSMENT:  Diabetes type 2, uncontrolled   See history of present illness for detailed discussion of his current management, blood sugar patterns and problems identified  She has Excellent control of her blood sugars with mostly near normal readings although checking mostly at bedtime Last A1c 6.4 Considering her age her bedtime readings may be low normal at times Does not appear to have unusually high or low readings after breakfast and a few fasting readings she has are consistently near normal No hypoglycemia  RENAL dysfunction: Followed by nephrologist and PCP, needs follow-up labs    HYPERTENSION:  Her blood pressure is  Higher today but she is anxious   PLAN:  She will for now stopped taking Humalog at suppertime Start checking some readings after breakfast and does not need to check all the time at bedtime She will change her Victoza to the morning with her Humalog so she does not have to check her sugars at night as much She will call if blood sugars are unusually high or low  Influenza vaccine given  Patient Instructions  Check blood sugars on waking up  2-3/7 days  Also check blood sugars about 2 hours after a meal and do this after different meals by rotation  Recommended blood sugar levels on waking up is 90-130 and about 2 hours after meal is 130-160  Please bring your blood sugar monitor to each visit, thank you  No Humalog at dinner  Take Victoza in am    [facility] 12/18/2016, 9:00 PM   Note: This office note was prepared with Estate agent. Any transcriptional errors that result from this process are unintentional.

## 2016-12-30 ENCOUNTER — Ambulatory Visit (INDEPENDENT_AMBULATORY_CARE_PROVIDER_SITE_OTHER): Payer: Medicare Other | Admitting: Sports Medicine

## 2016-12-30 ENCOUNTER — Encounter: Payer: Self-pay | Admitting: Sports Medicine

## 2016-12-30 VITALS — BP 164/65 | HR 59 | Resp 16

## 2016-12-30 DIAGNOSIS — M79675 Pain in left toe(s): Secondary | ICD-10-CM

## 2016-12-30 DIAGNOSIS — E1140 Type 2 diabetes mellitus with diabetic neuropathy, unspecified: Secondary | ICD-10-CM

## 2016-12-30 DIAGNOSIS — M79674 Pain in right toe(s): Secondary | ICD-10-CM

## 2016-12-30 DIAGNOSIS — I739 Peripheral vascular disease, unspecified: Secondary | ICD-10-CM

## 2016-12-30 DIAGNOSIS — B351 Tinea unguium: Secondary | ICD-10-CM

## 2016-12-30 NOTE — Progress Notes (Signed)
Subjective: [name] is a 83 y.o. female patient with history of diabetes who returns to office today complaining of long, painful nails  while ambulating in shoes; unable to trim. Patient states that the glucose reading this morning was good. States that she is doing ok.  Patient Active Problem List   Diagnosis Date Noted  . Pressure injury of skin 05/21/2016  . Cellulitis of great toe of right foot 05/15/2016  . Intractable pain 12/03/2014  . Type II diabetes mellitus, uncontrolled ([place]) 09/06/2013  . Chronic diastolic heart failure ([place]) 08/23/2013  . Anemia due to blood loss, chronic 08/23/2013  . Peptic esophagitis 08/23/2013  . Encounter for therapeutic drug monitoring 04/29/2013  . On amiodarone therapy 02/22/2013  . Long term current use of anticoagulant therapy 02/22/2013  . Candidal skin infection 12/23/2012  . Hyperlipidemia 12/23/2012  . GERD (gastroesophageal reflux disease) 12/23/2012  . Diabetes mellitus with renal complications ([place]) 23/55/7322  . Umbilical hernia, incarcerated - reducible 12/10/2012  . HTN (hypertension) 12/09/2012  . Dyslipidemia 12/09/2012  . Atrial fibrillation ([place]) 12/09/2012  . Chronic kidney disease, stage IV (severe) ([place]) 12/09/2012   Current Outpatient Prescriptions on File Prior to Visit  Medication Sig Dispense Refill  . acidophilus (RISAQUAD) CAPS capsule Take 1 capsule by mouth daily as needed (GI issues).     Marland Kitchen amiodarone (PACERONE) 200 MG tablet Take 0.5 tablets (100 mg total) by mouth daily. 90 tablet 2  . amLODipine (NORVASC) 10 MG tablet Take 5 mg by mouth daily at 12 noon.   1  . atorvastatin (LIPITOR) 40 MG tablet Take 40 mg by mouth every evening.     . B-D UF III MINI PEN NEEDLES 31G X 5 MM MISC USE 3 PEN NEEDLES PER DAY 100 each 1  . colchicine 0.6 MG tablet Take 0.6 mg by mouth daily.     . Cranberry 250 MG CAPS Take 750 mg by mouth daily.     Marland Kitchen econazole nitrate 1 % cream Apply 1 application topically daily as needed  (to groin rash).     Marland Kitchen esomeprazole (NEXIUM) 20 MG capsule Take 20 mg by mouth daily before breakfast.     . ferrous sulfate 325 (65 FE) MG tablet Take 325 mg by mouth daily with breakfast.    . fluticasone (FLONASE) 50 MCG/ACT nasal spray Place 2 sprays into both nostrils daily as needed for rhinitis.     . furosemide (LASIX) 80 MG tablet Take 40-80 mg by mouth 2 (two) times daily. Pt takes one tablet in the morning and one-half tablet in the evening.    Marland Kitchen HYDROcodone-acetaminophen (NORCO/VICODIN) 5-325 MG tablet Take 1-2 tablets by mouth every 4 (four) hours as needed (breakthrough pain). 30 tablet 0  . insulin lispro (HUMALOG KWIKPEN) 100 UNIT/ML KiwkPen Inject 7 units into the skin each morning and 5 units into the skin each evening.    Marland Kitchen KLOR-CON 10 10 MEQ tablet TAKE 3 TABLETS BY MOUTH EVERY MORNING AND TAKE 3 TABLETS BY MOUTH EVERY EVENING 180 tablet 7  . metolazone (ZAROXOLYN) 5 MG tablet Take 5 mg by mouth daily as needed (for edema).   3  . metoprolol (LOPRESSOR) 50 MG tablet Take 50 mg by mouth 2 (two) times daily.    Glory Rosebush VERIO test strip USE ONE STRIP TO CHECK GLUCOSE TWICE DAILY AS DIRECTED 100 each 3  . polyethylene glycol (MIRALAX / GLYCOLAX) packet Take 17 g by mouth daily as needed for mild constipation.    Marland Kitchen  VICTOZA 18 MG/3ML SOPN INJECT 1.2MG  SUBCUTANEOUSLY DAILY 6 pen 1  . warfarin (COUMADIN) 5 MG tablet Take as directed by [facility] 30 tablet 3  . warfarin (COUMADIN) 5 MG tablet Take 1 tablet daily or as directed by Coumadin clinic 30 tablet 3   No current facility-administered medications on file prior to visit.    Allergies  Allergen Reactions  . Lotensin [Benazepril Hcl] Anaphylaxis  . Zantac [Ranitidine Hcl] Anaphylaxis  . Tape Itching and Rash    Recent Results (from the past 2160 hour(s))  POCT INR     Status: None   Collection Time: 10/01/16 10:51 AM  Result Value Ref Range   INR 1.5   POCT INR     Status: None   Collection Time: 10/14/16  9:42  AM  Result Value Ref Range   INR 2.5   POCT INR     Status: None   Collection Time: 11/06/16 10:00 AM  Result Value Ref Range   INR 2.4   POCT INR     Status: None   Collection Time: 12/04/16 10:17 AM  Result Value Ref Range   INR 1.7   Comprehensive metabolic panel     Status: Abnormal   Collection Time: 12/18/16 10:43 AM  Result Value Ref Range   Sodium 142 135 - 145 mEq/L   Potassium 3.5 3.5 - 5.1 mEq/L   Chloride 105 96 - 112 mEq/L   CO2 31 19 - 32 mEq/L   Glucose, Bld 66 (L) 70 - 99 mg/dL   BUN 30 (H) 6 - 23 mg/dL   Creatinine, Ser 1.63 (H) 0.40 - 1.20 mg/dL   Total Bilirubin 0.5 0.2 - 1.2 mg/dL   Alkaline Phosphatase 37 (L) 39 - 117 U/L   AST 26 0 - 37 U/L   ALT 20 0 - 35 U/L   Total Protein 6.3 6.0 - 8.3 g/dL   Albumin 3.4 (L) 3.5 - 5.2 g/dL   Calcium 9.3 8.4 - 10.5 mg/dL   GFR 38.93 (L) >60.00 mL/min  POCT INR     Status: None   Collection Time: 12/18/16 11:12 AM  Result Value Ref Range   INR 2.5   POCT glycosylated hemoglobin (Hb A1C)     Status: None   Collection Time: 12/18/16  2:59 PM  Result Value Ref Range   Hemoglobin A1C 5.8     Objective: General: Patient is awake, alert, and oriented x 3 and in no acute distress.  Integument: Skin is warm, dry and supple bilateral. Nails are tender, long, thickened and dystrophic with subungual debris, consistent with onychomycosis, 1-5 bilateral. No signs of infection. No open lesions or preulcerative lesions present bilateral.Remaining integument unremarkable.  Vasculature:  Dorsalis Pedis pulse 0/4 bilateral. Posterior Tibial pulse  0/4 bilateral. Capillary fill time <5 sec 1-5 bilateral. No hair growth to the level of the digits.Temperature gradient increased on left leg. Mild varicosities present bilateral. 1+ left over right edema present bilateral.   Neurology: The patient has intact sensation measured with a 5.07/10g Semmes Weinstein Monofilament at all pedal sites bilateral . Vibratory sensation diminished  bilateral with tuning fork. No Babinski sign present bilateral.   Musculoskeletal: Pes planus and mild hammertoe noted bilateral. Muscular strength 5/5 in all lower extremity muscular groups bilateral without pain on range of motion. No tenderness with calf compression bilateral. No acute signs of DVT.  Assessment and Plan: Problem List Items Addressed This Visit    None    Visit Diagnoses  Mycotic toenails    -  Primary   Pain in toes of both feet       PVD (peripheral vascular disease) (HCC)       Type 2 diabetes mellitus with diabetic neuropathy, unspecified whether long term insulin use ([place])         -Examined patient. -Discussed and educated patient on diabetic foot care, especially with   regards to the vascular, neurological and musculoskeletal systems.  -Stressed the importance of good glycemic control and the detriment of not  controlling glucose levels in relation to the foot. -Mechanically debrided all nails 1-5 bilateral using sterile nail nipper and filed with dremel without incident -Answered all patient questions -Patient to return  in 3 months for at risk foot care -Patient advised to call the office if any problems or questions arise in the meantime.  Landis Martins, DPM

## 2016-12-30 NOTE — Patient Instructions (Signed)
[place] healthy feet for dry skin

## 2017-01-07 ENCOUNTER — Encounter: Payer: Self-pay | Admitting: Interventional Cardiology

## 2017-01-14 ENCOUNTER — Other Ambulatory Visit: Payer: Self-pay | Admitting: Interventional Cardiology

## 2017-01-15 ENCOUNTER — Ambulatory Visit (INDEPENDENT_AMBULATORY_CARE_PROVIDER_SITE_OTHER): Payer: Medicare Other | Admitting: *Deleted

## 2017-01-15 DIAGNOSIS — Z5181 Encounter for therapeutic drug level monitoring: Secondary | ICD-10-CM

## 2017-01-15 DIAGNOSIS — I4891 Unspecified atrial fibrillation: Secondary | ICD-10-CM

## 2017-01-15 LAB — POCT INR: INR: 2.3

## 2017-01-20 ENCOUNTER — Ambulatory Visit (INDEPENDENT_AMBULATORY_CARE_PROVIDER_SITE_OTHER): Payer: Medicare Other | Admitting: Interventional Cardiology

## 2017-01-20 ENCOUNTER — Encounter: Payer: Self-pay | Admitting: Interventional Cardiology

## 2017-01-20 VITALS — BP 202/70 | HR 61 | Ht 66.0 in | Wt 183.4 lb

## 2017-01-20 DIAGNOSIS — Z7901 Long term (current) use of anticoagulants: Secondary | ICD-10-CM | POA: Diagnosis not present

## 2017-01-20 DIAGNOSIS — E1165 Type 2 diabetes mellitus with hyperglycemia: Secondary | ICD-10-CM

## 2017-01-20 DIAGNOSIS — I10 Essential (primary) hypertension: Secondary | ICD-10-CM | POA: Diagnosis not present

## 2017-01-20 DIAGNOSIS — I5032 Chronic diastolic (congestive) heart failure: Secondary | ICD-10-CM

## 2017-01-20 DIAGNOSIS — I4891 Unspecified atrial fibrillation: Secondary | ICD-10-CM | POA: Diagnosis not present

## 2017-01-20 DIAGNOSIS — Z79899 Other long term (current) drug therapy: Secondary | ICD-10-CM

## 2017-01-20 LAB — HEPATIC FUNCTION PANEL
ALK PHOS: 50 IU/L (ref 39–117)
ALT: 24 IU/L (ref 0–32)
AST: 27 IU/L (ref 0–40)
Albumin: 3.6 g/dL (ref 3.5–4.7)
BILIRUBIN TOTAL: 0.4 mg/dL (ref 0.0–1.2)
BILIRUBIN, DIRECT: 0.14 mg/dL (ref 0.00–0.40)
Total Protein: 6.2 g/dL (ref 6.0–8.5)

## 2017-01-20 LAB — TSH: TSH: 1.95 u[IU]/mL (ref 0.450–4.500)

## 2017-01-20 NOTE — Patient Instructions (Signed)
Medication Instructions:  Your physician recommends that you continue on your current medications as directed. Please refer to the Current Medication list given to you today.  Labwork: Today for liver function panel and TSH  Your physician recommends that you return for lab work in: 6 months for liver function panel and TSH  Testing/Procedures: None ordered   Follow-Up: Your physician wants you to follow-up in: 6 months with Dr. Tamala Julian. You will receive a reminder letter in the mail two months in advance. If you don't receive a letter, please call our office to schedule the follow-up appointment.   Any Other Special Instructions Will Be Listed Below (If Applicable).  Continue to monitor and record blood pressures at home. Call office if blood pressures remain elevated.   If you need a refill on your cardiac medications before your next appointment, please call your pharmacy.

## 2017-01-20 NOTE — Progress Notes (Signed)
Cardiology Office Note    Date:  01/20/2017   ID:  [name], [name] 1933-04-17, MRN [id number]  PCP:  Melanie Nip, MD  Cardiologist: Melanie Grooms, MD   Chief Complaint  Patient presents with  . Atrial Fibrillation    History of Present Illness:  [name] is a 83 y.o. female who presents for Paroxysmal atrial fib, amiodarone therapy, chronic diastolic heart failure, elderly and frail  No palpitations, transient neurological complaints, bleeding, edema, orthopnea, or PND.  No episodes of syncope.  She denies chest pain and edema.   Past Medical History:  Diagnosis Date  . Diabetes mellitus without complication ([place])   . Fall at home Sept. 3, 2016   Fx  3 ribs  . Hypertension   . Ileus, postoperative ([place]) 12/17/2012  . Irregular heart beat   . Renal disorder     Past Surgical History:  Procedure Laterality Date  . BREAST LUMPECTOMY Right 2001   2 lumpectomies with radiation both 2001  . CATARACT EXTRACTION, BILATERAL    . HERNIA REPAIR    . I&D EXTREMITY Right 05/19/2016   Procedure: MINOR IRRIGATION AND DEBRIDEMENT Right great toe;  Surgeon: Melanie Arabian, MD;  Location: [facility];  Service: Orthopedics;  Laterality: Right;  . INSERTION OF MESH N/A 12/12/2012   Procedure: INSERTION OF MESH;  Surgeon: Melanie Hook, DO;  Location: [facility];  Service: General;  Laterality: N/A;  . LAPAROSCOPIC LYSIS OF ADHESIONS N/A 12/12/2012   Procedure: LAPAROSCOPIC LYSIS OF ADHESIONS;  Surgeon: Melanie Hook, DO;  Location: [facility];  Service: General;  Laterality: N/A;  . Right Lumpectomy    . VENTRAL HERNIA REPAIR N/A 12/12/2012   Procedure: LAPAROSCOPIC VENTRAL HERNIA;  Surgeon: Melanie Hook, DO;  Location: [facility];  Service: General;  Laterality: N/A;    Current Medications: Outpatient Medications Prior to Visit  Medication Sig Dispense Refill  . acidophilus (RISAQUAD) CAPS capsule Take 1 capsule by mouth daily as needed (GI issues).     Marland Kitchen amiodarone (PACERONE) 200  MG tablet Take 0.5 tablets (100 mg total) by mouth daily. 90 tablet 2  . amLODipine (NORVASC) 10 MG tablet Take 5 mg by mouth daily at 12 noon.   1  . atorvastatin (LIPITOR) 40 MG tablet Take 40 mg by mouth every evening.     . B-D UF III MINI PEN NEEDLES 31G X 5 MM MISC USE 3 PEN NEEDLES PER DAY 100 each 1  . colchicine 0.6 MG tablet Take 0.6 mg by mouth daily.     . Cranberry 250 MG CAPS Take 750 mg by mouth daily.     Marland Kitchen econazole nitrate 1 % cream Apply 1 application topically daily as needed (to groin rash).     Marland Kitchen esomeprazole (NEXIUM) 20 MG capsule Take 20 mg by mouth daily before breakfast.     . ferrous sulfate 325 (65 FE) MG tablet Take 325 mg by mouth daily with breakfast.    . fluticasone (FLONASE) 50 MCG/ACT nasal spray Place 2 sprays into both nostrils daily as needed for rhinitis.     . furosemide (LASIX) 80 MG tablet Take 40-80 mg by mouth 2 (two) times daily. Pt takes one tablet in the morning and one-half tablet in the evening.    . insulin lispro (HUMALOG KWIKPEN) 100 UNIT/ML KiwkPen Inject 7 Units into the skin every morning.     Marland Kitchen KLOR-CON 10 10 MEQ tablet TAKE 3 TABLETS BY MOUTH EVERY MORNING AND TAKE  3 TABLETS BY MOUTH EVERY EVENING 180 tablet 7  . metolazone (ZAROXOLYN) 5 MG tablet Take 5 mg by mouth daily as needed (for edema).   3  . metoprolol (LOPRESSOR) 50 MG tablet Take 50 mg by mouth 2 (two) times daily.    [name] VERIO test strip USE ONE STRIP TO CHECK GLUCOSE TWICE DAILY AS DIRECTED 100 each 3  . polyethylene glycol (MIRALAX / GLYCOLAX) packet Take 17 g by mouth daily as needed for mild constipation.    Marland Kitchen VICTOZA 18 MG/3ML SOPN INJECT 1.2MG  SUBCUTANEOUSLY DAILY 6 pen 1  . warfarin (COUMADIN) 5 MG tablet Take as directed by [facility] 30 tablet 3  . amiodarone (PACERONE) 200 MG tablet TAKE 1 TABLET BY MOUTH EVERY DAY (Patient not taking: Reported on 01/20/2017) 90 tablet 3  . HYDROcodone-acetaminophen (NORCO/VICODIN) 5-325 MG tablet Take 1-2 tablets by mouth  every 4 (four) hours as needed (breakthrough pain). (Patient not taking: Reported on 01/20/2017) 30 tablet 0  . warfarin (COUMADIN) 5 MG tablet Take 1 tablet daily or as directed by [facility] clinic (Patient not taking: Reported on 01/20/2017) 30 tablet 3   No facility-administered medications prior to visit.      Allergies:   Lotensin [benazepril hcl]; Zantac [ranitidine hcl]; and Tape   Social History   Social History  . Marital status: Married    Spouse name: N/A  . Number of children: N/A  . Years of education: N/A   Social History Main Topics  . Smoking status: Former Smoker    Types: Cigarettes  . Smokeless tobacco: Never Used  . Alcohol use No  . Drug use: No  . Sexual activity: No   Other Topics Concern  . None   Social History Narrative  . None     Family History:  The patient's family history includes Diabetes in her mother and sister.   ROS:   Please see the history of present illness.    Rash on her forearms bilaterally.  The rash itches.  Chronic left lower extremity swelling with cellulitis. All other systems reviewed and are negative.   PHYSICAL EXAM:   VS:  BP (!) 202/70 (BP Location: Left Arm)   Pulse 61   Ht 5\' 6"  (1.676 m)   Wt 183 lb 6.4 oz (83.2 kg)   BMI 29.60 kg/m    GEN: Well nourished, well developed, in no acute distress  HEENT: normal  Neck: no JVD, carotid bruits, or masses Cardiac: RRR; no murmurs, rubs, or gallops,no edema  Respiratory:  clear to auscultation bilaterally, normal work of breathing GI: soft, nontender, nondistended, + BS MS: no deformity or atrophy  Skin: warm and dry, no rash Neuro:  Alert and Oriented x 3, Strength and sensation are intact Psych: euthymic mood, full affect  Wt Readings from Last 3 Encounters:  01/20/17 183 lb 6.4 oz (83.2 kg)  12/18/16 182 lb (82.6 kg)  09/17/16 183 lb 6.4 oz (83.2 kg)      Studies/Labs Reviewed:   EKG:  EKG sinus rhythm, first-degree AV block with PR 250 ms, nonspecific T  wave flattening, left axis deviation.  Recent Labs: 05/22/2016: Hemoglobin 11.1; Platelets 266 07/23/2016: TSH 1.850 12/18/2016: ALT 20; BUN 30; Creatinine, Ser 1.63; Potassium 3.5; Sodium 142   Lipid Panel    Component Value Date/Time   CHOL 112 07/18/2016 1005   TRIG 111.0 07/18/2016 1005   HDL 53.70 07/18/2016 1005   CHOLHDL 2 07/18/2016 1005   VLDL 22.2 07/18/2016 1005  LDLCALC 36 07/18/2016 1005   LDLDIRECT 37.8 10/27/2013 0808    Additional studies/ records that were reviewed today include:  No recent laboratory data.  Most recent TSH was 1.85 in May.  Liver panel was performed and September.    ASSESSMENT:    1. Atrial fibrillation, unspecified type ([place])   2. Chronic diastolic heart failure ([place])   3. Long term current use of amiodarone   4. Essential hypertension   5. Long term current use of anticoagulant therapy   6. Uncontrolled type 2 diabetes mellitus with hyperglycemia (HCC)      PLAN:  In order of problems listed above:  1. Rhythm control with low-dose amiodarone, 100 mg/day. 2. No evidence of volume overload. 3. No evidence of amiodarone toxicity.  TSH and hepatic panel today and on return.  Consider chest x-ray on next visit.   4. Elevated today. Taking amlodipine.  Monitor pressure at home.  Target is 140/90 mmHg or less. 5. No bleeding complications on Coumadin  Clinical follow-up in 6 months.  Call dyspnea or swelling.   Medication Adjustments/Labs and Tests Ordered: Current medicines are reviewed at length with the patient today.  Concerns regarding medicines are outlined above.  Medication changes, Labs and Tests ordered today are listed in the Patient Instructions below. Patient Instructions  Medication Instructions:  Your physician recommends that you continue on your current medications as directed. Please refer to the Current Medication list given to you today.  Labwork: Today for liver function panel and TSH  Your physician recommends  that you return for lab work in: 6 months for liver function panel and TSH  Testing/Procedures: None ordered   Follow-Up: Your physician wants you to follow-up in: 6 months with Dr. Tamala Julian. You will receive a reminder letter in the mail two months in advance. If you don't receive a letter, please call our office to schedule the follow-up appointment.   Any Other Special Instructions Will Be Listed Below (If Applicable).  Continue to monitor and record blood pressures at home. Call office if blood pressures remain elevated.   If you need a refill on your cardiac medications before your next appointment, please call your pharmacy.     Signed, Melanie Grooms, MD  01/20/2017 10:57 AM    [facility] [place], [place], [place]  [address] Phone: [phone]; Fax: [phone]

## 2017-01-22 ENCOUNTER — Telehealth: Payer: Self-pay | Admitting: *Deleted

## 2017-01-22 NOTE — Telephone Encounter (Signed)
-----   Message from Belva Crome, MD sent at 01/21/2017  8:07 PM EDT ----- Let the patient know labs are okay A copy will be sent to [place], Bill Salinas, MD

## 2017-01-22 NOTE — Telephone Encounter (Signed)
Reviewed lab results with pt.  While on the phone pt mentioned that Dr. Tamala Julian had wanted her to monitor her BP and let us know if it remains elevated.  Pt checked BP yesterday and they were as follows: AM- 198/88 Noon- 190/92 PM- 175/78 PM- 168/72  Pt takes Metoprolol 50mg , Amlodipine 5mg  and Furosemide 40mg  in the morning with breakfast.  Had pt check BP while on the phone with me and it was 188/81.  Took meds about an hour before the call.  Advised I would send message to Dr Tamala Julian for review and advisement.

## 2017-01-24 NOTE — Telephone Encounter (Signed)
Supposed to be on Furosemide twice daily, according to chart. Also, does she use metolazone any?

## 2017-01-26 NOTE — Telephone Encounter (Signed)
Pt states she is taking Furosemide 80mg  qAM and 40mg  q PM.  Pt has not had to use Metolazone for about a month now.  BP yesterday was 198/85 in AM and 186/83 in PM and these were after meds.  Will route to Dr. Tamala Julian for review.

## 2017-01-26 NOTE — Telephone Encounter (Signed)
Hydralazine 25 mg PO TID

## 2017-01-27 DIAGNOSIS — E1022 Type 1 diabetes mellitus with diabetic chronic kidney disease: Secondary | ICD-10-CM | POA: Diagnosis not present

## 2017-01-27 MED ORDER — HYDRALAZINE HCL 25 MG PO TABS: 25.0000 mg | ORAL_TABLET | Freq: Three times a day (TID) | ORAL | 3 refills | Status: DC

## 2017-01-27 NOTE — Telephone Encounter (Signed)
Spoke with pt and made her aware of new order per Dr. Tamala Julian.  Advised to continue monitoring BP and call if remains elevated.  Pt verbalized understanding and was appreciative for call.

## 2017-02-10 ENCOUNTER — Ambulatory Visit (INDEPENDENT_AMBULATORY_CARE_PROVIDER_SITE_OTHER): Payer: Medicare Other | Admitting: *Deleted

## 2017-02-10 DIAGNOSIS — I4891 Unspecified atrial fibrillation: Secondary | ICD-10-CM | POA: Diagnosis not present

## 2017-02-10 DIAGNOSIS — Z5181 Encounter for therapeutic drug level monitoring: Secondary | ICD-10-CM | POA: Diagnosis not present

## 2017-02-10 LAB — POCT INR: INR: 2

## 2017-02-10 NOTE — Patient Instructions (Signed)
Today take 1.5 tablets then continue on same dosage 1 tablet daily.  Recheck INR in 6 weeks. Eat 3 servings each week.

## 2017-02-19 ENCOUNTER — Telehealth: Payer: Self-pay | Admitting: Interventional Cardiology

## 2017-02-19 DIAGNOSIS — D631 Anemia in chronic kidney disease: Secondary | ICD-10-CM | POA: Diagnosis not present

## 2017-02-19 DIAGNOSIS — N2581 Secondary hyperparathyroidism of renal origin: Secondary | ICD-10-CM | POA: Diagnosis not present

## 2017-02-19 DIAGNOSIS — N183 Chronic kidney disease, stage 3 (moderate): Secondary | ICD-10-CM | POA: Diagnosis not present

## 2017-02-19 DIAGNOSIS — Z6832 Body mass index (BMI) 32.0-32.9, adult: Secondary | ICD-10-CM | POA: Diagnosis not present

## 2017-02-19 MED ORDER — HYDRALAZINE HCL 50 MG PO TABS: 50.0000 mg | ORAL_TABLET | Freq: Three times a day (TID) | ORAL | 3 refills | Status: DC

## 2017-02-19 NOTE — Telephone Encounter (Signed)
New message    Pt C/O BP issue: STAT if pt C/O blurred vision, one-sided weakness or slurred speech  1. What are your last 5 BP readings? appt with Dr. Edrick Oh today -  189/80 - took by nurse -- recheck by MD - about the same   Yesterday 197/85 waiting a min or two 182/83 - left arm   2. Are you having any other symptoms (ex. Dizziness, headache, blurred vision, passed out)? Slight headache -patient think it was mostly allergy   3. What is your BP issue? Discuss with nurse

## 2017-02-19 NOTE — Telephone Encounter (Signed)
Increase hydralazine to 50 mg 3 times daily.

## 2017-02-19 NOTE — Telephone Encounter (Signed)
Informed patient that per Dr. Tamala Julian increase hydralazine to 50 mg TID, and continue to monitor BP. Advised patient to call back in a week with bp readings. Patient in agreement with plan and thanked me for the call.

## 2017-02-19 NOTE — Telephone Encounter (Signed)
Called patient back who states her BP's readings are elevated at 189/90, yesterday it was 197/85 she has been taking her hydralazine 25 mg TID and BP remains elevated. Patient denies any other symptoms. She is also taking amlodipine 5 mg once daily, Lopressor 50 mg BID and lasix 80 mg in the morning and 40 mg at night. Informed patient I would forward to Dr. Tamala Julian for further recommendations. Patient verbalized understanding and thanked me for the call.

## 2017-03-20 ENCOUNTER — Ambulatory Visit: Payer: Medicare Other | Admitting: Endocrinology

## 2017-03-20 ENCOUNTER — Encounter: Payer: Self-pay | Admitting: Endocrinology

## 2017-03-20 VITALS — BP 138/68 | HR 59 | Ht 65.0 in | Wt 186.0 lb

## 2017-03-20 DIAGNOSIS — E1165 Type 2 diabetes mellitus with hyperglycemia: Secondary | ICD-10-CM | POA: Diagnosis not present

## 2017-03-20 DIAGNOSIS — Z794 Long term (current) use of insulin: Secondary | ICD-10-CM

## 2017-03-20 LAB — POCT GLYCOSYLATED HEMOGLOBIN (HGB A1C): Hemoglobin A1C: 6.1

## 2017-03-20 NOTE — Progress Notes (Signed)
Patient ID: [name], female   DOB: [birth year], 83 y.o.   MRN: [id number]    Reason for Appointment: Follow-up for Type 2 Diabetes  Referring physician: Jordan Hawks Rankins  History of Present Illness:          Diagnosis: Type 2 diabetes mellitus, date of diagnosis: ? 72 years ago        Past history: She was initially treated with Metformin but this was done because it caused GI side effects  May also tried Actos at some point, probably started because of the fear of side effects but did not know if it helped Actos stopped ? Reason Over the last few years she has been taking Precose with meals and also Januvia in increasing doses Apparently her blood sugars have been higher since 9/14 when she had abdominal surgery and before that they were as low as 70. On her initial consultation in 6/15 because of her renal dysfunction her medication regimen was changed. Previously was taking Januvia, Precose and glipizide without adequate control; her blood sugars were occasionally over 200 fasting and A1c was 9.4 She was started on Victoza 0.6 mg and Lantus insulin 8 units a day but the Lantus had to be stopped because of relatively low fasting glucose.  Since she had tried Prandin and glipizide and her postprandial readings were as high as 330 she was started on Humalog at mealtimes in 8/15  Recent history:   INSULIN regimen: Humalog 7 units U acb   Also taking Victoza at bedtime   Her A1c is 6.1, previous range 5.8-6.4 recently  Current blood sugar patterns, current management  and problems identified:  She was advised to stop her Humalog at suppertime on her last visit because of sometimes having low-normal sugars at bedtime  Also she was told repeatedly to check her sugars more often after breakfast and lunch and some in the morning on waking up instead of just at bedtime but she still doing the same   She is taking Humalog at breakfast only, this is apparently a large meals  usually  Previously had not brought her blood sugar meter for review  However her blood sugars are still excellent at bedtime with only occasional readings around 180-190  No hypoglycemia reported with morning Humalog  She was also told to try and take Victoza in the morning for convenience but still does it at bedtime  She has a relatively large breakfast, sometimes high in fat   Usually eating a 1/2 sandwich at lunch, sometimes less       Oral hypoglycemic drugs the patient is taking are: none  Side effects from medications have been: None  Glucose monitoring:  done once  a day        Glucometer:  One Touch Verio Readings by monitor download:  Am 114 with only one reading  Hs 95-1 98 with median about 132   Self-care:   Meals: 3 meals per day.   Breakfast: eggs, grits,meat;  sometimes 1/2 Boost; will have spaghetti or rice at supper. Dinner 6 pm           Exercise:  none, unable to          Dietician visit: Most recent: Unknown.                Weight history:  Wt Readings from Last 3 Encounters:  03/20/17 186 lb (84.4 kg)  01/20/17 183 lb 6.4 oz (83.2 kg)  12/18/16 182 lb (82.6  kg)   Glycemic control:  Lab Results  Component Value Date   HGBA1C 5.8 12/18/2016   HGBA1C 6.4 07/18/2016   HGBA1C 6.9 04/23/2016   Lab Results  Component Value Date   MICROALBUR 0.7 09/11/2015   LDLCALC 36 07/18/2016   CREATININE 1.63 (H) 12/18/2016     Allergies as of 03/20/2017      Reactions   Lotensin [benazepril Hcl] Anaphylaxis   Zantac [ranitidine Hcl] Anaphylaxis   Tape Itching, Rash      Medication List        Accurate as of 03/20/17  3:02 PM. Always use your most recent med list.          acidophilus Caps capsule Take 1 capsule by mouth daily as needed (GI issues).   amiodarone 200 MG tablet Commonly known as:  PACERONE Take 0.5 tablets (100 mg total) by mouth daily.   amLODipine 10 MG tablet Commonly known as:  NORVASC Take 5 mg by mouth daily at 12  noon.   atorvastatin 40 MG tablet Commonly known as:  LIPITOR Take 40 mg by mouth every evening.   B-D UF III MINI PEN NEEDLES 31G X 5 MM Misc Generic drug:  Insulin Pen Needle USE 3 PEN NEEDLES PER DAY   colchicine 0.6 MG tablet Take 0.6 mg by mouth daily.   Cranberry 250 MG Caps Take 750 mg by mouth daily.   econazole nitrate 1 % cream Apply 1 application topically daily as needed (to groin rash).   esomeprazole 20 MG capsule Commonly known as:  NEXIUM Take 20 mg by mouth daily before breakfast.   ferrous sulfate 325 (65 FE) MG tablet Take 325 mg by mouth daily with breakfast.   fluticasone 50 MCG/ACT nasal spray Commonly known as:  FLONASE Place 2 sprays into both nostrils daily as needed for rhinitis.   furosemide 80 MG tablet Commonly known as:  LASIX Take 40-80 mg by mouth 2 (two) times daily. Pt takes one tablet in the morning and one-half tablet in the evening.   HUMALOG KWIKPEN 100 UNIT/ML KiwkPen Generic drug:  insulin lispro Inject 7 Units into the skin every morning.   hydrALAZINE 50 MG tablet Commonly known as:  APRESOLINE Take 1 tablet (50 mg total) by mouth 3 (three) times daily.   KLOR-CON 10 10 MEQ tablet Generic drug:  potassium chloride TAKE 3 TABLETS BY MOUTH EVERY MORNING AND TAKE 3 TABLETS BY MOUTH EVERY EVENING   metolazone 5 MG tablet Commonly known as:  ZAROXOLYN Take 5 mg by mouth daily as needed (for edema).   metoprolol tartrate 50 MG tablet Commonly known as:  LOPRESSOR Take 50 mg by mouth 2 (two) times daily.   ONETOUCH VERIO test strip Generic drug:  glucose blood USE ONE STRIP TO CHECK GLUCOSE TWICE DAILY AS DIRECTED   polyethylene glycol packet Commonly known as:  MIRALAX / GLYCOLAX Take 17 g by mouth daily as needed for mild constipation.   VICTOZA 18 MG/3ML Sopn Generic drug:  liraglutide INJECT 1.2MG  SUBCUTANEOUSLY DAILY   warfarin 5 MG tablet Commonly known as:  COUMADIN Take as directed by the anticoagulation  clinic. If you are unsure how to take this medication, talk to your nurse or doctor. Original instructions:  Take as directed by [facility]       Allergies:  Allergies  Allergen Reactions  . Lotensin [Benazepril Hcl] Anaphylaxis  . Zantac [Ranitidine Hcl] Anaphylaxis  . Tape Itching and Rash    Past Medical History:  Diagnosis  Date  . Diabetes mellitus without complication ([place])   . Fall at home Sept. 3, 2016   Fx  3 ribs  . Hypertension   . Ileus, postoperative ([place]) 12/17/2012  . Irregular heart beat   . Renal disorder     Past Surgical History:  Procedure Laterality Date  . BREAST LUMPECTOMY Right 2001   2 lumpectomies with radiation both 2001  . CATARACT EXTRACTION, BILATERAL    . HERNIA REPAIR    . I&D EXTREMITY Right 05/19/2016   Procedure: MINOR IRRIGATION AND DEBRIDEMENT Right great toe;  Surgeon: Gaynelle Arabian, MD;  Location: [facility];  Service: Orthopedics;  Laterality: Right;  . INSERTION OF MESH N/A 12/12/2012   Procedure: INSERTION OF MESH;  Surgeon: Madilyn Hook, DO;  Location: [facility];  Service: General;  Laterality: N/A;  . LAPAROSCOPIC LYSIS OF ADHESIONS N/A 12/12/2012   Procedure: LAPAROSCOPIC LYSIS OF ADHESIONS;  Surgeon: Madilyn Hook, DO;  Location: [facility];  Service: General;  Laterality: N/A;  . Right Lumpectomy    . VENTRAL HERNIA REPAIR N/A 12/12/2012   Procedure: LAPAROSCOPIC VENTRAL HERNIA;  Surgeon: Madilyn Hook, DO;  Location: [facility];  Service: General;  Laterality: N/A;    Family History  Problem Relation Age of Onset  . Diabetes Mother   . Diabetes Sister     Social History:  reports that she has quit smoking. Her smoking use included cigarettes. she has never used smokeless tobacco. She reports that she does not drink alcohol or use drugs.    Review of Systems        Lipids: She has been on high dose Lipitor for hyperlipidemia      Lab Results  Component Value Date   CHOL 112 07/18/2016   HDL 53.70 07/18/2016   LDLCALC 36  07/18/2016   LDLDIRECT 37.8 10/27/2013   TRIG 111.0 07/18/2016   CHOLHDL 2 07/18/2016       The blood pressure has been treated with multiple medications including 10 mg amlodipine  Followed by other physicians Including nephrologist   BP Readings from Last 3 Encounters:  03/20/17 138/68  01/20/17 (!) 202/70  12/30/16 (!) 164/65     On amiodarone, This has not caused any change in thyroid functions   Lab Results  Component Value Date   TSH 1.950 01/20/2017       She has had history of swelling of legs and is on Lasix      Chronic kidney disease followed by nephrologist  Every 6 months now   Lab Results  Component Value Date   CREATININE 1.63 (H) 12/18/2016   Last diabetic foot exam in 2/17     She has weakness in her legs since 2014; needs a walker and cannot walk Much  Physical Examination:  BP 138/68   Pulse (!) 59   Ht 5\' 5"  (1.651 m)   Wt 186 lb (84.4 kg)   SpO2 97%   BMI 30.95 kg/m     ASSESSMENT:  Diabetes type 2, uncontrolled   See history of present illness for detailed discussion of his current management, blood sugar patterns and problems identified  She has again fairly good blood sugar control with Victoza and only Humalog in the morning at breakfast Her A1c is near normal at 6 although this may be affected higher biotin renal dysfunction/anemia As discussed above her glucose monitoring is being done only at bedtime and not clear if she has reasonably good control at other times of the day or even  hypoglycemia No hypoglycemia reported symptomatically  RENAL dysfunction: Followed by nephrologist and PCP, needs follow-up labs which she will get from her PCP  Lipids: Rechecking the labs will be deferred to PCP  HYPERTENSION: Her blood pressure is better today   PLAN:  Start checking some readings at least after breakfast and occasionally before She can try to check her sugars 2-3 hours after eating in the evening instead of  bedtime Reminded her that this will help [place] adjust her dose at breakfast and help her adjust her diet in the evening  She will change her Victoza to the morning with her Humalog for convenience She will call if blood sugars are unusually high or low    Patient Instructions  Check blood sugars on waking up  2/7 days  Also check blood sugars about 2 hours after a meal and do this after different meals by rotation  Recommended blood sugar levels on waking up is 90-130 and about 2 hours after meal is 130-160  Please bring your blood sugar monitor to each visit, thank you  Take Victoza in am also    [name] 03/20/2017, 3:02 PM   Note: This office note was prepared with Dragon voice recognition system technology. Any transcriptional errors that result from this process are unintentional.

## 2017-03-20 NOTE — Patient Instructions (Addendum)
Check blood sugars on waking up  2/7 days  Also check blood sugars about 2 hours after a meal and do this after different meals by rotation  Recommended blood sugar levels on waking up is 90-130 and about 2 hours after meal is 130-160  Please bring your blood sugar monitor to each visit, thank you  Take Victoza in am also

## 2017-03-27 ENCOUNTER — Ambulatory Visit (INDEPENDENT_AMBULATORY_CARE_PROVIDER_SITE_OTHER): Payer: Medicare Other | Admitting: *Deleted

## 2017-03-27 DIAGNOSIS — Z5181 Encounter for therapeutic drug level monitoring: Secondary | ICD-10-CM

## 2017-03-27 DIAGNOSIS — I4891 Unspecified atrial fibrillation: Secondary | ICD-10-CM | POA: Diagnosis not present

## 2017-03-27 LAB — POCT INR: INR: 1.3

## 2017-03-27 NOTE — Patient Instructions (Signed)
Description   Today  January 4th take 1 and 1/2 tablets (7.5mg ) then tomorrow January 5th  take 1and 1/2 tablets(7.5mg ) then continue on same dosage 1 tablet daily.  Recheck INR in 1 week.

## 2017-04-03 ENCOUNTER — Ambulatory Visit (INDEPENDENT_AMBULATORY_CARE_PROVIDER_SITE_OTHER): Payer: Medicare Other | Admitting: *Deleted

## 2017-04-03 DIAGNOSIS — I4891 Unspecified atrial fibrillation: Secondary | ICD-10-CM | POA: Diagnosis not present

## 2017-04-03 DIAGNOSIS — Z5181 Encounter for therapeutic drug level monitoring: Secondary | ICD-10-CM

## 2017-04-03 LAB — POCT INR: INR: 1.9

## 2017-04-03 NOTE — Patient Instructions (Signed)
Description   Today take 1.5 tablets then continue on same dosage 1 tablet daily.  Recheck INR in 2 weeks.  [facility] [phone]

## 2017-04-05 ENCOUNTER — Other Ambulatory Visit: Payer: Self-pay | Admitting: Endocrinology

## 2017-04-07 ENCOUNTER — Encounter: Payer: Self-pay | Admitting: Sports Medicine

## 2017-04-07 ENCOUNTER — Ambulatory Visit (INDEPENDENT_AMBULATORY_CARE_PROVIDER_SITE_OTHER): Payer: Medicare Other | Admitting: Sports Medicine

## 2017-04-07 DIAGNOSIS — I739 Peripheral vascular disease, unspecified: Secondary | ICD-10-CM

## 2017-04-07 DIAGNOSIS — M79676 Pain in unspecified toe(s): Secondary | ICD-10-CM | POA: Diagnosis not present

## 2017-04-07 DIAGNOSIS — M79674 Pain in right toe(s): Secondary | ICD-10-CM

## 2017-04-07 DIAGNOSIS — E1140 Type 2 diabetes mellitus with diabetic neuropathy, unspecified: Secondary | ICD-10-CM | POA: Diagnosis not present

## 2017-04-07 DIAGNOSIS — M79675 Pain in left toe(s): Secondary | ICD-10-CM | POA: Diagnosis not present

## 2017-04-07 DIAGNOSIS — B351 Tinea unguium: Secondary | ICD-10-CM | POA: Diagnosis not present

## 2017-04-07 NOTE — Progress Notes (Signed)
Subjective: [name] is a 84 y.o. female patient with history of diabetes who returns to office today complaining of long, painful nails  while ambulating in shoes; unable to trim. Patient states that the glucose reading this morning was good. States that she is doing ok.No new issues.   Patient Active Problem List   Diagnosis Date Noted  . Pressure injury of skin 05/21/2016  . Cellulitis of great toe of right foot 05/15/2016  . Intractable pain 12/03/2014  . Type II diabetes mellitus, uncontrolled ([place]) 09/06/2013  . Chronic diastolic heart failure ([place]) 08/23/2013  . Anemia due to blood loss, chronic 08/23/2013  . Peptic esophagitis 08/23/2013  . Encounter for therapeutic drug monitoring 04/29/2013  . On amiodarone therapy 02/22/2013  . Long term current use of anticoagulant therapy 02/22/2013  . Candidal skin infection 12/23/2012  . Hyperlipidemia 12/23/2012  . GERD (gastroesophageal reflux disease) 12/23/2012  . Diabetes mellitus with renal complications ([place]) 21/19/4174  . Umbilical hernia, incarcerated - reducible 12/10/2012  . HTN (hypertension) 12/09/2012  . Dyslipidemia 12/09/2012  . Atrial fibrillation ([place]) 12/09/2012  . Chronic kidney disease, stage IV (severe) ([place]) 12/09/2012   Current Outpatient Medications on File Prior to Visit  Medication Sig Dispense Refill  . acidophilus (RISAQUAD) CAPS capsule Take 1 capsule by mouth daily as needed (GI issues).     Marland Kitchen amiodarone (PACERONE) 200 MG tablet Take 0.5 tablets (100 mg total) by mouth daily. 90 tablet 2  . amLODipine (NORVASC) 10 MG tablet Take 5 mg by mouth daily at 12 noon.   1  . atorvastatin (LIPITOR) 40 MG tablet Take 40 mg by mouth every evening.     . B-D UF III MINI PEN NEEDLES 31G X 5 MM MISC USE 3 PEN NEEDLES PER DAY 100 each 1  . colchicine 0.6 MG tablet Take 0.6 mg by mouth daily.     . Cranberry 250 MG CAPS Take 750 mg by mouth daily.     Marland Kitchen econazole nitrate 1 % cream Apply 1 application topically  daily as needed (to groin rash).     Marland Kitchen esomeprazole (NEXIUM) 20 MG capsule Take 20 mg by mouth daily before breakfast.     . ferrous sulfate 325 (65 FE) MG tablet Take 325 mg by mouth daily with breakfast.    . fluticasone (FLONASE) 50 MCG/ACT nasal spray Place 2 sprays into both nostrils daily as needed for rhinitis.     . furosemide (LASIX) 80 MG tablet Take 40-80 mg by mouth 2 (two) times daily. Pt takes one tablet in the morning and one-half tablet in the evening.    . hydrALAZINE (APRESOLINE) 50 MG tablet Take 1 tablet (50 mg total) by mouth 3 (three) times daily. 270 tablet 3  . insulin lispro (HUMALOG KWIKPEN) 100 UNIT/ML KiwkPen Inject 7 units before breakfast 5 pen 2  . KLOR-CON 10 10 MEQ tablet TAKE 3 TABLETS BY MOUTH EVERY MORNING AND TAKE 3 TABLETS BY MOUTH EVERY EVENING 180 tablet 7  . metolazone (ZAROXOLYN) 5 MG tablet Take 5 mg by mouth daily as needed (for edema).   3  . metoprolol (LOPRESSOR) 50 MG tablet Take 50 mg by mouth 2 (two) times daily.    Glory Rosebush VERIO test strip USE ONE STRIP TO CHECK GLUCOSE TWICE DAILY AS DIRECTED 100 each 3  . polyethylene glycol (MIRALAX / GLYCOLAX) packet Take 17 g by mouth daily as needed for mild constipation.    Marland Kitchen VICTOZA 18 MG/3ML SOPN INJECT 1.2MG  SUBCUTANEOUSLY  DAILY 6 pen 1  . warfarin (COUMADIN) 5 MG tablet Take as directed by [facility]madin Clinic 30 tablet 3   No current facility-administered medications on file prior to visit.    Allergies  Allergen Reactions  . Lotensin [Benazepril Hcl] Anaphylaxis  . Zantac [Ranitidine Hcl] Anaphylaxis  . Tape Itching and Rash    Recent Results (from the past 2160 hour(s))  POCT INR     Status: None   Collection Time: 01/15/17  9:40 AM  Result Value Ref Range   INR 2.3   Hepatic function panel     Status: None   Collection Time: 01/20/17 11:10 AM  Result Value Ref Range   Total Protein 6.2 6.0 - 8.5 g/dL   Albumin 3.6 3.5 - 4.7 g/dL   Bilirubin Total 0.4 0.0 - 1.2 mg/dL   Bilirubin, Direct  0.14 0.00 - 0.40 mg/dL   Alkaline Phosphatase 50 39 - 117 IU/L   AST 27 0 - 40 IU/L   ALT 24 0 - 32 IU/L  TSH     Status: None   Collection Time: 01/20/17 11:10 AM  Result Value Ref Range   TSH 1.950 0.450 - 4.500 uIU/mL  POCT INR     Status: None   Collection Time: 02/10/17  9:29 AM  Result Value Ref Range   INR 2.0   POCT glycosylated hemoglobin (Hb A1C)     Status: None   Collection Time: 03/20/17  5:07 PM  Result Value Ref Range   Hemoglobin A1C 6.1   POCT INR     Status: None   Collection Time: 03/27/17  9:33 AM  Result Value Ref Range   INR 1.3   POCT INR     Status: None   Collection Time: 04/03/17  9:41 AM  Result Value Ref Range   INR 1.9     Objective: General: Patient is awake, alert, and oriented x 3 and in no acute distress.  Integument: Skin is warm, dry and supple bilateral. Nails are tender, long, thickened and dystrophic with subungual debris, consistent with onychomycosis, 1-5 bilateral. No signs of infection. No open lesions or preulcerative lesions present bilateral.Remaining integument unremarkable.  Vasculature:  Dorsalis Pedis pulse 0/4 bilateral. Posterior Tibial pulse  0/4 bilateral. Capillary fill time <5 sec 1-5 bilateral. No hair growth to the level of the digits.Temperature gradient increased on left leg. Mild varicosities present bilateral. 1+ left over right edema present bilateral.   Neurology: The patient has intact sensation measured with a 5.07/10g Semmes Weinstein Monofilament at all pedal sites bilateral . Vibratory sensation diminished bilateral with tuning fork. No Babinski sign present bilateral.   Musculoskeletal: Pes planus and mild hammertoe noted bilateral. Muscular strength 5/5 in all lower extremity muscular groups bilateral without pain on range of motion. No tenderness with calf compression bilateral. No acute signs of DVT.  Assessment and Plan: Problem List Items Addressed This Visit    None    Visit Diagnoses    Mycotic  toenails    -  Primary   Pain in toes of both feet       PVD (peripheral vascular disease) ([place])       Type 2 diabetes mellitus with diabetic neuropathy, unspecified whether long term insulin use ([place])         -Examined patient. -Discussed and educated patient on diabetic foot care, especially with   regards to the vascular, neurological and musculoskeletal systems.  -Stressed the importance of good glycemic control and the detriment of  not  controlling glucose levels in relation to the foot. -Mechanically debrided all nails 1-5 bilateral using sterile nail nipper and filed with dremel without incident -Answered all patient questions -Patient to return  in 3 months for at risk foot care -Patient advised to call the office if any problems or questions arise in the meantime.  Landis Martins, DPM

## 2017-04-17 ENCOUNTER — Ambulatory Visit: Payer: Medicare Other | Admitting: Pharmacist

## 2017-04-17 DIAGNOSIS — I4891 Unspecified atrial fibrillation: Secondary | ICD-10-CM

## 2017-04-17 DIAGNOSIS — Z5181 Encounter for therapeutic drug level monitoring: Secondary | ICD-10-CM | POA: Diagnosis not present

## 2017-04-17 DIAGNOSIS — E1022 Type 1 diabetes mellitus with diabetic chronic kidney disease: Secondary | ICD-10-CM | POA: Diagnosis not present

## 2017-04-17 LAB — POCT INR: INR: 1.5

## 2017-04-17 NOTE — Patient Instructions (Signed)
Description   Today and tomorrow take 1.5 tablets then change dose to 1 tablet daily, except 1.5 tablets on Wednesdays.  Recheck INR in 10 days.  [facility] [phone]

## 2017-04-24 ENCOUNTER — Ambulatory Visit (INDEPENDENT_AMBULATORY_CARE_PROVIDER_SITE_OTHER): Payer: Medicare Other | Admitting: *Deleted

## 2017-04-24 DIAGNOSIS — Z5181 Encounter for therapeutic drug level monitoring: Secondary | ICD-10-CM

## 2017-04-24 DIAGNOSIS — I4891 Unspecified atrial fibrillation: Secondary | ICD-10-CM | POA: Diagnosis not present

## 2017-04-24 LAB — POCT INR: INR: 1.9

## 2017-04-24 NOTE — Patient Instructions (Signed)
Description   Change coumadin dose to  1 tablet daily, except 1.5 tablets on Wednesdays and Fridays   Recheck INR in 2 weeks.  [facility] [phone]

## 2017-05-08 ENCOUNTER — Ambulatory Visit (INDEPENDENT_AMBULATORY_CARE_PROVIDER_SITE_OTHER): Payer: Medicare Other

## 2017-05-08 DIAGNOSIS — I4891 Unspecified atrial fibrillation: Secondary | ICD-10-CM

## 2017-05-08 DIAGNOSIS — Z5181 Encounter for therapeutic drug level monitoring: Secondary | ICD-10-CM

## 2017-05-08 LAB — POCT INR: INR: 2.3

## 2017-05-08 NOTE — Patient Instructions (Signed)
Description   Continue on same dosage 1 tablet daily except 1.5 tablets on Wednesdays and Fridays. Recheck INR in 3 weeks.  [facility] [phone]      

## 2017-05-12 DIAGNOSIS — I10 Essential (primary) hypertension: Secondary | ICD-10-CM | POA: Diagnosis not present

## 2017-05-12 DIAGNOSIS — E1122 Type 2 diabetes mellitus with diabetic chronic kidney disease: Secondary | ICD-10-CM | POA: Diagnosis not present

## 2017-05-12 DIAGNOSIS — I480 Paroxysmal atrial fibrillation: Secondary | ICD-10-CM | POA: Diagnosis not present

## 2017-05-12 DIAGNOSIS — D509 Iron deficiency anemia, unspecified: Secondary | ICD-10-CM | POA: Diagnosis not present

## 2017-05-15 DIAGNOSIS — E1122 Type 2 diabetes mellitus with diabetic chronic kidney disease: Secondary | ICD-10-CM | POA: Diagnosis not present

## 2017-05-15 DIAGNOSIS — I10 Essential (primary) hypertension: Secondary | ICD-10-CM | POA: Diagnosis not present

## 2017-05-15 DIAGNOSIS — E7800 Pure hypercholesterolemia, unspecified: Secondary | ICD-10-CM | POA: Diagnosis not present

## 2017-05-15 DIAGNOSIS — I480 Paroxysmal atrial fibrillation: Secondary | ICD-10-CM | POA: Diagnosis not present

## 2017-05-29 ENCOUNTER — Ambulatory Visit: Payer: Medicare Other | Admitting: Pharmacist

## 2017-05-29 DIAGNOSIS — I4891 Unspecified atrial fibrillation: Secondary | ICD-10-CM

## 2017-05-29 DIAGNOSIS — Z5181 Encounter for therapeutic drug level monitoring: Secondary | ICD-10-CM

## 2017-05-29 LAB — POCT INR: INR: 1.8

## 2017-05-29 NOTE — Patient Instructions (Signed)
Description   Today take 2 tablets then continue on same dosage 1 tablet daily except 1.5 tablets on Wednesdays and Fridays.   Recheck INR in 3 weeks.  [facility] [phone]

## 2017-06-09 ENCOUNTER — Other Ambulatory Visit: Payer: Self-pay | Admitting: Interventional Cardiology

## 2017-06-18 ENCOUNTER — Encounter: Payer: Self-pay | Admitting: Endocrinology

## 2017-06-18 ENCOUNTER — Other Ambulatory Visit: Payer: Self-pay | Admitting: Endocrinology

## 2017-06-18 ENCOUNTER — Ambulatory Visit: Payer: Medicare Other | Admitting: Endocrinology

## 2017-06-18 ENCOUNTER — Ambulatory Visit: Payer: Medicare Other | Admitting: *Deleted

## 2017-06-18 VITALS — BP 170/78 | HR 64 | Ht 65.0 in | Wt 185.0 lb

## 2017-06-18 DIAGNOSIS — E1165 Type 2 diabetes mellitus with hyperglycemia: Secondary | ICD-10-CM

## 2017-06-18 DIAGNOSIS — I4891 Unspecified atrial fibrillation: Secondary | ICD-10-CM

## 2017-06-18 DIAGNOSIS — Z5181 Encounter for therapeutic drug level monitoring: Secondary | ICD-10-CM | POA: Diagnosis not present

## 2017-06-18 DIAGNOSIS — Z794 Long term (current) use of insulin: Secondary | ICD-10-CM

## 2017-06-18 LAB — MICROALBUMIN / CREATININE URINE RATIO
Creatinine,U: 41.1 mg/dL
MICROALB/CREAT RATIO: 322.1 mg/g — AB (ref 0.0–30.0)
Microalb, Ur: 132.3 mg/dL — ABNORMAL HIGH (ref 0.0–1.9)

## 2017-06-18 LAB — POCT GLYCOSYLATED HEMOGLOBIN (HGB A1C): HEMOGLOBIN A1C: 5.8

## 2017-06-18 LAB — POCT INR: INR: 2

## 2017-06-18 NOTE — Patient Instructions (Signed)
Description   Today take 1.5 tablets then continue on same dosage 1 tablet daily except 1.5 tablets on Wednesdays and Fridays. Recheck INR in 3 weeks.  [facility] [phone]

## 2017-06-18 NOTE — Progress Notes (Signed)
Patient ID: [name], female   DOB: Aug 01, 1933, 84 y.o.   MRN: [id number]    Reason for Appointment: Follow-up for Type 2 Diabetes  Referring physician: Jordan Hawks Rankins  History of Present Illness:          Diagnosis: Type 2 diabetes mellitus, date of diagnosis: ? 69 years ago        Past history: She was initially treated with Metformin but this was done because it caused GI side effects  May also tried Actos at some point, probably started because of the fear of side effects but did not know if it helped Actos stopped ? Reason Over the last few years she has been taking Precose with meals and also Januvia in increasing doses Apparently her blood sugars have been higher since 9/14 when she had abdominal surgery and before that they were as low as 70. On her initial consultation in 6/15 because of her renal dysfunction her medication regimen was changed. Previously was taking Januvia, Precose and glipizide without adequate control; her blood sugars were occasionally over 200 fasting and A1c was 9.4 She was started on Victoza 0.6 mg and Lantus insulin 8 units a day but the Lantus had to be stopped because of relatively low fasting glucose.  Since she had tried Prandin and glipizide and her postprandial readings were as high as 330 she was started on Humalog at mealtimes in 8/15  Recent history:   INSULIN regimen: Humalog 7 units U acb   Also taking Victoza at bedtime   Her A1c is 5.8, previously up to 6.4  Current blood sugar patterns, current management  and problems identified:  She was advised to take her Victoza in the morning with her Humalog but she is still taking this at night  She thinks her blood sugar may have been lower when she tried taking the Victoza in the morning previously  However blood sugars after dinner are extremely variable, again checking blood sugars mostly around 9-10 PM  She now says that sometimes she will have desserts like cakes and  pies and occasionally cookies in the evening along with a half ginger ale  Blood sugars may be as low as 79 at bedtime, likely when she does not eat any sweets  More recently she has had low normal sugars after breakfast/before lunch; she is usually eating 1 carbohydrate at breakfast  She has not had any hypoglycemic symptoms  She has 2 readings in the morning which are fairly good fasting  She has a relatively large breakfast, sometimes high in fat   Usually eating a 1/2 sandwich at lunch, sometimes less       Oral hypoglycemic drugs the patient is taking are: none  Side effects from medications have been: None   Glucose monitoring:  done once  a day        Glucometer:  One Touch Verio Readings by monitor download:  Mean values apply above for all meters except median for One Touch  PRE-MEAL Fasting Lunch Dinner Bedtime Overall  Glucose range:  89, 106  178   79-307   Mean/median:     158  150   POST-MEAL PC Breakfast PC Lunch PC Dinner  Glucose range:  88, 84  137, 147   Mean/median:         Self-care:   Meals: 3 meals per day.   Breakfast: eggs, grits,meat;  sometimes 1/2 Boost; will have spaghetti or rice at supper. Dinner 6 pm  Exercise:  none, unable to          Dietician visit: Most recent: Unknown.                Weight history:  Wt Readings from Last 3 Encounters:  06/18/17 185 lb (83.9 kg)  03/20/17 186 lb (84.4 kg)  01/20/17 183 lb 6.4 oz (83.2 kg)   Glycemic control:  Lab Results  Component Value Date   HGBA1C 5.8 06/18/2017   HGBA1C 6.1 03/20/2017   HGBA1C 5.8 12/18/2016   Lab Results  Component Value Date   MICROALBUR 132.3 (H) 06/18/2017   LDLCALC 36 07/18/2016   CREATININE 1.63 (H) 12/18/2016     Allergies as of 06/18/2017      Reactions   Lotensin [benazepril Hcl] Anaphylaxis   Zantac [ranitidine Hcl] Anaphylaxis   Tape Itching, Rash      Medication List        Accurate as of 06/18/17 12:37 PM. Always use your most recent  med list.          acidophilus Caps capsule Take 1 capsule by mouth daily as needed (GI issues).   amiodarone 200 MG tablet Commonly known as:  PACERONE Take 0.5 tablets (100 mg total) by mouth daily.   amLODipine 10 MG tablet Commonly known as:  NORVASC Take 5 mg by mouth daily at 12 noon.   atorvastatin 40 MG tablet Commonly known as:  LIPITOR Take 40 mg by mouth every evening.   B-D UF III MINI PEN NEEDLES 31G X 5 MM Misc Generic drug:  Insulin Pen Needle USE 3 PEN NEEDLES PER DAY   colchicine 0.6 MG tablet Take 0.6 mg by mouth daily.   Cranberry 250 MG Caps Take 750 mg by mouth daily.   econazole nitrate 1 % cream Apply 1 application topically daily as needed (to groin rash).   esomeprazole 20 MG capsule Commonly known as:  NEXIUM Take 20 mg by mouth daily before breakfast.   ferrous sulfate 325 (65 FE) MG tablet Take 325 mg by mouth daily with breakfast.   fluticasone 50 MCG/ACT nasal spray Commonly known as:  FLONASE Place 2 sprays into both nostrils daily as needed for rhinitis.   furosemide 80 MG tablet Commonly known as:  LASIX Take 40-80 mg by mouth 2 (two) times daily. Pt takes one tablet in the morning and one-half tablet in the evening.   hydrALAZINE 50 MG tablet Commonly known as:  APRESOLINE Take 1 tablet (50 mg total) by mouth 3 (three) times daily.   insulin lispro 100 UNIT/ML KiwkPen Commonly known as:  HUMALOG KWIKPEN Inject 7 units before breakfast   KLOR-CON 10 10 MEQ tablet Generic drug:  potassium chloride TAKE 3 TABLETS BY MOUTH EVERY MORNING AND TAKE 3 TABLETS BY MOUTH EVERY EVENING   metolazone 5 MG tablet Commonly known as:  ZAROXOLYN Take 5 mg by mouth daily as needed (for edema).   metoprolol tartrate 50 MG tablet Commonly known as:  LOPRESSOR Take 50 mg by mouth 2 (two) times daily.   ONETOUCH VERIO test strip Generic drug:  glucose blood USE ONE STRIP TO CHECK GLUCOSE TWICE DAILY AS DIRECTED   polyethylene glycol  packet Commonly known as:  MIRALAX / GLYCOLAX Take 17 g by mouth daily as needed for mild constipation.   VICTOZA 18 MG/3ML Sopn Generic drug:  liraglutide INJECT 1.2MG  SUBCUTANEOUSLY DAILY   warfarin 5 MG tablet Commonly known as:  COUMADIN Take as directed by the anticoagulation clinic. If you  are unsure how to take this medication, talk to your nurse or doctor. Original instructions:  TAKE AS DIRECTED BY COUMADIN CLINIC       Allergies:  Allergies  Allergen Reactions  . Lotensin [Benazepril Hcl] Anaphylaxis  . Zantac [Ranitidine Hcl] Anaphylaxis  . Tape Itching and Rash    Past Medical History:  Diagnosis Date  . Diabetes mellitus without complication ([place])   . Fall at home Sept. 3, 2016   Fx  3 ribs  . Hypertension   . Ileus, postoperative ([place]) 12/17/2012  . Irregular heart beat   . Renal disorder     Past Surgical History:  Procedure Laterality Date  . BREAST LUMPECTOMY Right 2001   2 lumpectomies with radiation both 2001  . CATARACT EXTRACTION, BILATERAL    . HERNIA REPAIR    . I&D EXTREMITY Right 05/19/2016   Procedure: MINOR IRRIGATION AND DEBRIDEMENT Right great toe;  Surgeon: Gaynelle Arabian, MD;  Location: [facility];  Service: Orthopedics;  Laterality: Right;  . INSERTION OF MESH N/A 12/12/2012   Procedure: INSERTION OF MESH;  Surgeon: Madilyn Hook, DO;  Location: [facility];  Service: General;  Laterality: N/A;  . LAPAROSCOPIC LYSIS OF ADHESIONS N/A 12/12/2012   Procedure: LAPAROSCOPIC LYSIS OF ADHESIONS;  Surgeon: Madilyn Hook, DO;  Location: [facility];  Service: General;  Laterality: N/A;  . Right Lumpectomy    . VENTRAL HERNIA REPAIR N/A 12/12/2012   Procedure: LAPAROSCOPIC VENTRAL HERNIA;  Surgeon: Madilyn Hook, DO;  Location: [facility];  Service: General;  Laterality: N/A;    Family History  Problem Relation Age of Onset  . Diabetes Mother   . Diabetes Sister     Social History:  reports that she has quit smoking. Her smoking use included cigarettes. She has  never used smokeless tobacco. She reports that she does not drink alcohol or use drugs.    Review of Systems        Lipids: She has been on high dose Lipitor for hyperlipidemia Her last LDL was 52 done in 04/2017      Lab Results  Component Value Date   CHOL 112 07/18/2016   HDL 53.70 07/18/2016   LDLCALC 36 07/18/2016   LDLDIRECT 37.8 10/27/2013   TRIG 111.0 07/18/2016   CHOLHDL 2 07/18/2016       The blood pressure has been treated with multiple medications including 10 mg amlodipine  Followed by other physicians Including cardiologist and blood pressure appears to be variable   BP Readings from Last 3 Encounters:  06/18/17 (!) 170/78  03/20/17 138/68  01/20/17 (!) 202/70     On amiodarone, This has not caused any change in thyroid functions Last TSH was in 10/18   Lab Results  Component Value Date   TSH 1.950 01/20/2017       She has had history of swelling of legs and is on Lasix      Chronic kidney disease followed by nephrologist   Most recent creatinine was 1.8   Lab Results  Component Value Date   CREATININE 1.63 (H) 12/18/2016   Last diabetic foot exam in 2/17     She has weakness in her legs since 2014; needs a walker and cannot walk Much  Physical Examination:  BP (!) 170/78 (BP Location: Left Arm, Patient Position: Sitting, Cuff Size: Normal)   Pulse 64   Ht 5\' 5"  (1.651 m)   Wt 185 lb (83.9 kg)   SpO2 98%   BMI 30.79 kg/m  ASSESSMENT:  Diabetes type 2, uncontrolled   See history of present illness for detailed discussion of his current management, blood sugar patterns and problems identified  She has excellent A1c although this may be affected by her anemia and renal dysfunction Blood sugars at night after supper/at bedtime are averaging about 160 and this is determined by her compliance with diet Most likely she gets readings up to nearly 300 when she is eating desserts but otherwise sugars may be low normal at bedtime This is  without any mealtime insulin at that time However she is appearing to have low normal readings late morning and likely does not need any Humalog in the morning for breakfast  RENAL dysfunction: Followed by nephrologist and PCP and stable  Lipids: Recently better with her PCP  HYPERTENSION: Her blood pressure is higher today, to follow-up with her PCP and cardiologist   PLAN:  Start checking blood sugars more consistently after breakfast and lunch If she is going to eat a dessert she will take 5 units of Humalog in the evening No Humalog at breakfast for now and switch Victoza to the morning Follow-up in 4 months  Patient Instructions  Victoza in am and only 5 Humalog at supper if wanting dessert    Elayne Snare 06/18/2017, 12:37 PM   Note: This office note was prepared with Dragon voice recognition system technology. Any transcriptional errors that result from this process are unintentional.

## 2017-06-18 NOTE — Patient Instructions (Signed)
Victoza in am and only 5 Humalog at supper if wanting dessert

## 2017-07-07 ENCOUNTER — Ambulatory Visit (INDEPENDENT_AMBULATORY_CARE_PROVIDER_SITE_OTHER): Payer: Medicare Other | Admitting: Sports Medicine

## 2017-07-07 ENCOUNTER — Encounter: Payer: Self-pay | Admitting: Sports Medicine

## 2017-07-07 DIAGNOSIS — E1140 Type 2 diabetes mellitus with diabetic neuropathy, unspecified: Secondary | ICD-10-CM

## 2017-07-07 DIAGNOSIS — B351 Tinea unguium: Secondary | ICD-10-CM | POA: Diagnosis not present

## 2017-07-07 DIAGNOSIS — M79675 Pain in left toe(s): Secondary | ICD-10-CM

## 2017-07-07 DIAGNOSIS — I739 Peripheral vascular disease, unspecified: Secondary | ICD-10-CM

## 2017-07-07 DIAGNOSIS — M79674 Pain in right toe(s): Secondary | ICD-10-CM

## 2017-07-07 NOTE — Progress Notes (Signed)
Subjective: [name] is a 84 y.o. female patient with history of diabetes who returns to office today complaining of long, painful nails  while ambulating in shoes; unable to trim. Patient states that the glucose reading this morning was good, 168 and last A1c was 5.8 Doctor took her off insulin. States that she was treated for cellulitis of left leg and has been treating small opening with neosporin with improvement per doctors recommendation. No other issues.   Patient Active Problem List   Diagnosis Date Noted  . Pressure injury of skin 05/21/2016  . Cellulitis of great toe of right foot 05/15/2016  . Intractable pain 12/03/2014  . Type II diabetes mellitus, uncontrolled ([place]) 09/06/2013  . Chronic diastolic heart failure ([place]) 08/23/2013  . Anemia due to blood loss, chronic 08/23/2013  . Peptic esophagitis 08/23/2013  . Encounter for therapeutic drug monitoring 04/29/2013  . On amiodarone therapy 02/22/2013  . Long term current use of anticoagulant therapy 02/22/2013  . Candidal skin infection 12/23/2012  . Hyperlipidemia 12/23/2012  . GERD (gastroesophageal reflux disease) 12/23/2012  . Diabetes mellitus with renal complications ([place]) 85/27/7824  . Umbilical hernia, incarcerated - reducible 12/10/2012  . HTN (hypertension) 12/09/2012  . Dyslipidemia 12/09/2012  . Atrial fibrillation ([place]) 12/09/2012  . Chronic kidney disease, stage IV (severe) ([place]) 12/09/2012   Current Outpatient Medications on File Prior to Visit  Medication Sig Dispense Refill  . acidophilus (RISAQUAD) CAPS capsule Take 1 capsule by mouth daily as needed (GI issues).     Marland Kitchen amiodarone (PACERONE) 200 MG tablet Take 0.5 tablets (100 mg total) by mouth daily. 90 tablet 2  . amLODipine (NORVASC) 10 MG tablet Take 5 mg by mouth daily at 12 noon.   1  . atorvastatin (LIPITOR) 40 MG tablet Take 40 mg by mouth every evening.     . B-D UF III MINI PEN NEEDLES 31G X 5 MM MISC USE 3 PEN NEEDLES PER DAY 100 each 1  .  colchicine 0.6 MG tablet Take 0.6 mg by mouth daily.     . Cranberry 250 MG CAPS Take 750 mg by mouth daily.     Marland Kitchen econazole nitrate 1 % cream Apply 1 application topically daily as needed (to groin rash).     Marland Kitchen esomeprazole (NEXIUM) 20 MG capsule Take 20 mg by mouth daily before breakfast.     . ferrous sulfate 325 (65 FE) MG tablet Take 325 mg by mouth daily with breakfast.    . fluticasone (FLONASE) 50 MCG/ACT nasal spray Place 2 sprays into both nostrils daily as needed for rhinitis.     . furosemide (LASIX) 80 MG tablet Take 40-80 mg by mouth 2 (two) times daily. Pt takes one tablet in the morning and one-half tablet in the evening.    . hydrALAZINE (APRESOLINE) 50 MG tablet Take 1 tablet (50 mg total) by mouth 3 (three) times daily. 270 tablet 3  . insulin lispro (HUMALOG KWIKPEN) 100 UNIT/ML KiwkPen Inject 7 units before breakfast 5 pen 2  . KLOR-CON 10 10 MEQ tablet TAKE 3 TABLETS BY MOUTH EVERY MORNING AND TAKE 3 TABLETS BY MOUTH EVERY EVENING 180 tablet 7  . metolazone (ZAROXOLYN) 5 MG tablet Take 5 mg by mouth daily as needed (for edema).   3  . metoprolol (LOPRESSOR) 50 MG tablet Take 50 mg by mouth 2 (two) times daily.    Glory Rosebush VERIO test strip USE ONE STRIP TO CHECK GLUCOSE TWICE DAILY AS DIRECTED 100 each 3  .  polyethylene glycol (MIRALAX / GLYCOLAX) packet Take 17 g by mouth daily as needed for mild constipation.    Marland Kitchen VICTOZA 18 MG/3ML SOPN INJECT 1.2MG  SUBCUTANEOUSLY DAILY 18 mL 1  . warfarin (COUMADIN) 5 MG tablet TAKE AS DIRECTED BY COUMADIN CLINIC 40 tablet 3   No current facility-administered medications on file prior to visit.    Allergies  Allergen Reactions  . Lotensin [Benazepril Hcl] Anaphylaxis  . Zantac [Ranitidine Hcl] Anaphylaxis  . Tape Itching and Rash    Recent Results (from the past 2160 hour(s))  POCT INR     Status: None   Collection Time: 04/17/17  9:35 AM  Result Value Ref Range   INR 1.5   POCT INR     Status: None   Collection Time:  04/24/17  9:37 AM  Result Value Ref Range   INR 1.9   POCT INR     Status: None   Collection Time: 05/08/17  9:30 AM  Result Value Ref Range   INR 2.3   POCT INR     Status: None   Collection Time: 05/29/17  9:48 AM  Result Value Ref Range   INR 1.8   POCT glycosylated hemoglobin (Hb A1C)     Status: None   Collection Time: 06/18/17  9:13 AM  Result Value Ref Range   Hemoglobin A1C 5.8   Microalbumin / creatinine urine ratio     Status: Abnormal   Collection Time: 06/18/17  9:34 AM  Result Value Ref Range   Microalb, Ur 132.3 (H) 0.0 - 1.9 mg/dL   Creatinine,U 41.1 mg/dL   Microalb Creat Ratio 322.1 (H) 0.0 - 30.0 mg/g  POCT INR     Status: None   Collection Time: 06/18/17 10:30 AM  Result Value Ref Range   INR 2.0     Objective: General: Patient is awake, alert, and oriented x 3 and in no acute distress.  Integument: Skin is warm, dry and supple bilateral. Nails are tender, long, thickened and dystrophic with subungual debris, consistent with onychomycosis, 1-5 bilateral. Abrasion left leg. No signs of infection. No open lesions or preulcerative lesions present bilateral.Remaining integument unremarkable.  Vasculature:  Dorsalis Pedis pulse 0/4 bilateral. Posterior Tibial pulse  0/4 bilateral. Capillary fill time <5 sec 1-5 bilateral. No hair growth to the level of the digits.Temperature gradient not increased on left leg. Mild varicosities present bilateral. 1+ left over right edema present bilateral.   Neurology: The patient has intact sensation measured with a 5.07/10g Semmes Weinstein Monofilament at all pedal sites bilateral . Vibratory sensation diminished bilateral with tuning fork. No Babinski sign present bilateral.   Musculoskeletal: Pes planus and mild hammertoe noted bilateral. Muscular strength 5/5 in all lower extremity muscular groups bilateral without pain on range of motion. No tenderness with calf compression bilateral. No acute signs of DVT.  Assessment and  Plan: Problem List Items Addressed This Visit    None    Visit Diagnoses    Mycotic toenails    -  Primary   Pain in toes of both feet       PVD (peripheral vascular disease) ([place])       Type 2 diabetes mellitus with diabetic neuropathy, unspecified whether long term insulin use ([place])         -Examined patient. -Discussed and educated patient on diabetic foot care, especially with   regards to the vascular, neurological and musculoskeletal systems.  -Stressed the importance of good glycemic control and the detriment of not  controlling glucose levels in relation to the foot. -Mechanically debrided all nails 1-5 bilateral using sterile nail nipper and filed with dremel without incident -Continue with neosporin to left leg and PCP management for history of cellulitis  -Answered all patient questions -Patient to return  in 3 months for at risk foot care -Patient advised to call the office if any problems or questions arise in the meantime.  Landis Martins, DPM

## 2017-07-09 ENCOUNTER — Ambulatory Visit: Payer: Medicare Other | Admitting: Pharmacist

## 2017-07-09 DIAGNOSIS — Z5181 Encounter for therapeutic drug level monitoring: Secondary | ICD-10-CM

## 2017-07-09 DIAGNOSIS — I4891 Unspecified atrial fibrillation: Secondary | ICD-10-CM | POA: Diagnosis not present

## 2017-07-09 LAB — POCT INR: INR: 2.3

## 2017-07-09 NOTE — Patient Instructions (Signed)
Description   Continue on same dosage 1 tablet daily except 1.5 tablets on Wednesdays and Fridays. Recheck INR in 4 weeks.  [facility] [phone]

## 2017-07-11 DIAGNOSIS — E1022 Type 1 diabetes mellitus with diabetic chronic kidney disease: Secondary | ICD-10-CM | POA: Diagnosis not present

## 2017-07-21 DIAGNOSIS — K210 Gastro-esophageal reflux disease with esophagitis: Secondary | ICD-10-CM | POA: Diagnosis not present

## 2017-07-21 DIAGNOSIS — R198 Other specified symptoms and signs involving the digestive system and abdomen: Secondary | ICD-10-CM | POA: Diagnosis not present

## 2017-07-29 ENCOUNTER — Ambulatory Visit: Payer: Medicare Other | Admitting: Cardiology

## 2017-08-07 ENCOUNTER — Encounter (INDEPENDENT_AMBULATORY_CARE_PROVIDER_SITE_OTHER): Payer: Self-pay

## 2017-08-07 ENCOUNTER — Ambulatory Visit: Payer: Medicare Other | Admitting: *Deleted

## 2017-08-07 DIAGNOSIS — Z5181 Encounter for therapeutic drug level monitoring: Secondary | ICD-10-CM | POA: Diagnosis not present

## 2017-08-07 DIAGNOSIS — I4891 Unspecified atrial fibrillation: Secondary | ICD-10-CM

## 2017-08-07 LAB — POCT INR: INR: 2.2

## 2017-08-07 NOTE — Patient Instructions (Signed)
Description   Continue on same dosage 1 tablet daily except 1.5 tablets on Wednesdays and Fridays. Recheck INR in 6 weeks.  [facility] Clinic [phone]      

## 2017-08-11 ENCOUNTER — Other Ambulatory Visit: Payer: Self-pay | Admitting: Interventional Cardiology

## 2017-08-19 NOTE — Progress Notes (Signed)
Cardiology Office Note:    Date:  08/21/2017   ID:  [name], DOB Mar 19, 1934, MRN [id number]  PCP:  Aretta Nip, MD  Cardiologist:  Sinclair Grooms, MD   Referring MD: Aretta Nip, MD   Chief Complaint  Patient presents with  . Follow-up    Afib, HFpEF    History of Present Illness:    [name] is a 84 y.o. female with a hx of paroxysmal atrial fibrillation on amiodarone and lopressor and coumadin, HTN, and chronic diastolic heart failure. She last saw Dr. Tamala Julian in clinic on 01/20/17. At that time, she was doing well on low dose amiodarone 100 mg daily. TSH and LFTs were WNL. Pressures were mildly elevated, she was instructed to follow pressures at home.   She presents today for her 6 month follow up. She denies shortness of breath, palpitations, and chest pain. She has some increased swelling in her left lower extremity. She has seen podiatry for this and they did not think it was infected. She is putting neosporin on it twice per week. She does not complain of pain in her leg, but it is tender to the touch on the back of her ankle. She has seen vascular surgery in 2016, who noted chronic venous insufficiency for 6-8 years and moderate arterial insufficiency but was asymptomatic at that time.    Past Medical History:  Diagnosis Date  . Diabetes mellitus without complication ([place])   . Fall at home Sept. 3, 2016   Fx  3 ribs  . Hypertension   . Ileus, postoperative ([place]) 12/17/2012  . Irregular heart beat   . Renal disorder     Past Surgical History:  Procedure Laterality Date  . BREAST LUMPECTOMY Right 2001   2 lumpectomies with radiation both 2001  . CATARACT EXTRACTION, BILATERAL    . HERNIA REPAIR    . I&D EXTREMITY Right 05/19/2016   Procedure: MINOR IRRIGATION AND DEBRIDEMENT Right great toe;  Surgeon: Gaynelle Arabian, MD;  Location: [facility];  Service: Orthopedics;  Laterality: Right;  . INSERTION OF MESH N/A 12/12/2012   Procedure: INSERTION OF  MESH;  Surgeon: Madilyn Hook, DO;  Location: [facility];  Service: General;  Laterality: N/A;  . LAPAROSCOPIC LYSIS OF ADHESIONS N/A 12/12/2012   Procedure: LAPAROSCOPIC LYSIS OF ADHESIONS;  Surgeon: Madilyn Hook, DO;  Location: [facility];  Service: General;  Laterality: N/A;  . Right Lumpectomy    . VENTRAL HERNIA REPAIR N/A 12/12/2012   Procedure: LAPAROSCOPIC VENTRAL HERNIA;  Surgeon: Madilyn Hook, DO;  Location: [facility];  Service: General;  Laterality: N/A;    Current Medications: Current Meds  Medication Sig  . acidophilus (RISAQUAD) CAPS capsule Take 1 capsule by mouth daily as needed (GI issues).   Marland Kitchen amiodarone (PACERONE) 200 MG tablet Take 0.5 tablets (100 mg total) by mouth daily.  Marland Kitchen amLODipine (NORVASC) 10 MG tablet Take 5 mg by mouth daily at 12 noon.   Marland Kitchen atorvastatin (LIPITOR) 40 MG tablet Take 40 mg by mouth every evening.   . B-D UF III MINI PEN NEEDLES 31G X 5 MM MISC USE 3 PEN NEEDLES PER DAY  . colchicine 0.6 MG tablet Take 0.6 mg by mouth daily.   . Cranberry 250 MG CAPS Take 750 mg by mouth daily.   Marland Kitchen econazole nitrate 1 % cream Apply 1 application topically daily as needed (to groin rash).   Marland Kitchen esomeprazole (NEXIUM) 20 MG capsule Take 20 mg by mouth daily  before breakfast.   . ferrous sulfate 325 (65 FE) MG tablet Take 325 mg by mouth daily with breakfast.  . fluticasone (FLONASE) 50 MCG/ACT nasal spray Place 2 sprays into both nostrils daily as needed for rhinitis.   . furosemide (LASIX) 80 MG tablet Take 40-80 mg by mouth 2 (two) times daily. Pt takes one tablet in the morning and one-half tablet in the evening.  . hydrALAZINE (APRESOLINE) 50 MG tablet Take 1 tablet (50 mg total) by mouth 3 (three) times daily.  . insulin lispro (HUMALOG KWIKPEN) 100 UNIT/ML KiwkPen Inject 7 units before breakfast  . KLOR-CON 10 10 MEQ tablet TAKE 3 TABLETS BY MOUTH EVERY MORNING AND TAKE 3 TABLETS BY MOUTH EVERY EVENING  . metolazone (ZAROXOLYN) 5 MG tablet Take 5 mg by mouth daily as needed  (for edema).   . metoprolol (LOPRESSOR) 50 MG tablet Take 50 mg by mouth 2 (two) times daily.  Glory Rosebush VERIO test strip USE ONE STRIP TO CHECK GLUCOSE TWICE DAILY AS DIRECTED  . polyethylene glycol (MIRALAX / GLYCOLAX) packet Take 17 g by mouth daily as needed for mild constipation.  Marland Kitchen VICTOZA 18 MG/3ML SOPN INJECT 1.2MG  SUBCUTANEOUSLY DAILY  . warfarin (COUMADIN) 5 MG tablet TAKE AS DIRECTED BY COUMADIN CLINIC     Allergies:   Lotensin [benazepril hcl]; Zantac [ranitidine hcl]; and Tape   Social History   Socioeconomic History  . Marital status: Married    Spouse name: Not on file  . Number of children: Not on file  . Years of education: Not on file  . Highest education level: Not on file  Occupational History  . Not on file  Social Needs  . Financial resource strain: Not on file  . Food insecurity:    Worry: Not on file    Inability: Not on file  . Transportation needs:    Medical: Not on file    Non-medical: Not on file  Tobacco Use  . Smoking status: Former Smoker    Types: Cigarettes  . Smokeless tobacco: Never Used  Substance and Sexual Activity  . Alcohol use: No  . Drug use: No  . Sexual activity: Never  Lifestyle  . Physical activity:    Days per week: Not on file    Minutes per session: Not on file  . Stress: Not on file  Relationships  . Social connections:    Talks on phone: Not on file    Gets together: Not on file    Attends religious service: Not on file    Active member of club or organization: Not on file    Attends meetings of clubs or organizations: Not on file    Relationship status: Not on file  Other Topics Concern  . Not on file  Social History Narrative  . Not on file     Family History: The patient's family history includes Diabetes in her mother and sister.  ROS:   Please see the history of present illness.     All other systems reviewed and are negative.  EKGs/Labs/Other Studies Reviewed:    The following studies were  reviewed today:  Echo 2014 Study Conclusions - Left ventricle: The cavity size was normal. Systolic function was normal. The estimated ejection fraction was in the range of 60% to 65%. Although no diagnostic regional wall motion abnormality was identified, this possibility cannot be completely excluded on the basis of this study. - Aortic valve: Mild regurgitation. - Mitral valve: Calcified annulus. Mildly thickened leaflets .  Mild regurgitation. - Left atrium: The atrium was mildly dilated. - Pulmonary arteries: Systolic pressure was moderately increased. PA peak pressure: 101mm Hg (S).   EKG:  EKG is not ordered today.    Recent Labs: 12/18/2016: BUN 30; Creatinine, Ser 1.63; Potassium 3.5; Sodium 142 01/20/2017: ALT 24; TSH 1.950  Recent Lipid Panel    Component Value Date/Time   CHOL 112 07/18/2016 1005   TRIG 111.0 07/18/2016 1005   HDL 53.70 07/18/2016 1005   CHOLHDL 2 07/18/2016 1005   VLDL 22.2 07/18/2016 1005   LDLCALC 36 07/18/2016 1005   LDLDIRECT 37.8 10/27/2013 0808    Physical Exam:    VS:  BP (!) 150/80   Pulse 63   Ht 5\' 5"  (1.651 m)   Wt 189 lb 1.9 oz (85.8 kg)   SpO2 97%   BMI 31.47 kg/m     Wt Readings from Last 3 Encounters:  08/21/17 189 lb 1.9 oz (85.8 kg)  06/18/17 185 lb (83.9 kg)  03/20/17 186 lb (84.4 kg)     GEN: Obese female in no acute distress HEENT: Normal NECK: No JVD; No carotid bruits CARDIAC: RRR, no murmurs, rubs, gallops RESPIRATORY:  Clear to auscultation without rales, wheezing or rhonchi  ABDOMEN: Soft, non-tender, non-distended MUSCULOSKELETAL:  Left leg with chronic venous stasis skin changes, swelling, redness, no warmth, dry with pink skin under dry skin. Pulses not palpable, extremity with ROM and warm  SKIN: Warm and dry NEUROLOGIC:  Alert and oriented x 3 PSYCHIATRIC:  Normal affect   ASSESSMENT:    1. Chronic diastolic heart failure (HCC)   2. Paroxysmal atrial fibrillation ([place])   3. Long  term current use of anticoagulant therapy   4. Essential hypertension   5. Dyslipidemia   6. Swelling of left lower extremity   7. Diabetes mellitus due to underlying condition with stage 4 chronic kidney disease, without long-term current use of insulin (HCC)    PLAN:    In order of problems listed above:  Chronic diastolic heart failure ([place])  She currently takes 80 mg lasix qAMa nd 40 mg lasix qPM with 30 mEq Kdur BID. Given her lower extremity swelling, will increase her lasix to 80 mg BID x 3 days and increase kdur to 40 mEq BID x 3 days. She can decrease back to her 80/40 lasix regimen, but if swelling returns, she could stay on the 80 mg BID. She states she was given metolazone by another provider, but has not used it. She will hold off on metolazone for now while we titrate her lasix regimen. Will collect a BMP today.    Paroxysmal atrial fibrillation (HCC) Long term current use of anticoagulant therapy Continue amiodarone and lopressor. She denies palpitations and SOB. This patients CHA2DS2-VASc Score and unadjusted Ischemic Stroke Rate (% per year) is equal to 4.8 % stroke rate/year from a score of 4 (HTN, age, female). Continue coumadin for anticoagulation. INR will be checked today following this office visit. Will collect CBC today.    Essential hypertension Pressures are still elevated. Will continue norvasc at 5 mg, but increase hydralazine to 75 mg TID. She will keep a pressure log at home and call if her pressures continue to be elevated. If she continues to battle lower extremity swelling, may need to D/C norvasc.   Dyslipidemia 05/15/17: cholesterol 131, LDL 52, HDL 67, Trig 58. Continue lipitor 40 mg. ALT normal 04/2017.    Swelling of left lower extremity Lasix as above. Her lower extremity  swelling and skin changes are consistent with arterial and venous insufficiency. She see podiatry, which decreased her neosporin use to twice weekly. Her leg is now quite dry and I  worry about wound development. She will continue neosporin use. I have asked her to see her PCP and podiatry again. I have low suspicion for cellulitis. She saw vascular surgery in 2016 without intervention. She denies pain, but it can be tender to the touch. If this continues, she may need to go back to vascular surgery. Welcome input from PCP.    Diabetes mellitus due to underlying condition with stage 4 chronic kidney disease, without long-term current use of insulin (HCC) BMP today. Lasix as above. DM per PCP. Last A1c was 5.8%/    Follow up with Dr. Tamala Julian in 6 months.   Medication Adjustments/Labs and Tests Ordered: Current medicines are reviewed at length with the patient today.  Concerns regarding medicines are outlined above.  Orders Placed This Encounter  Procedures  . Basic Metabolic Panel (BMET)  . CBC   Meds ordered this encounter  Medications  . furosemide (LASIX) 40 MG tablet    Sig: Take 2 tablets (80 mg total) by mouth 2 (two) times daily.    Dispense:  180 tablet    Refill:  3  . potassium chloride SA (KLOR-CON M20) 20 MEQ tablet    Sig: Take 2 tablets (40 mEq total) by mouth daily.    Dispense:  60 tablet    Refill:  11  . hydrALAZINE (APRESOLINE) 25 MG tablet    Sig: Take 3 tablets (75 mg total) by mouth 3 (three) times daily.    Dispense:  270 tablet    Refill:  3    Signed, Ledora Bottcher, [place]  08/21/2017 9:11 AM    [facility]

## 2017-08-21 ENCOUNTER — Ambulatory Visit (INDEPENDENT_AMBULATORY_CARE_PROVIDER_SITE_OTHER): Payer: Medicare Other | Admitting: Physician Assistant

## 2017-08-21 ENCOUNTER — Encounter: Payer: Self-pay | Admitting: Physician Assistant

## 2017-08-21 ENCOUNTER — Telehealth: Payer: Self-pay | Admitting: *Deleted

## 2017-08-21 VITALS — BP 150/80 | HR 63 | Ht 65.0 in | Wt 189.1 lb

## 2017-08-21 DIAGNOSIS — Z7901 Long term (current) use of anticoagulants: Secondary | ICD-10-CM

## 2017-08-21 DIAGNOSIS — I8310 Varicose veins of unspecified lower extremity with inflammation: Secondary | ICD-10-CM | POA: Diagnosis not present

## 2017-08-21 DIAGNOSIS — I480 Paroxysmal atrial fibrillation: Secondary | ICD-10-CM

## 2017-08-21 DIAGNOSIS — I10 Essential (primary) hypertension: Secondary | ICD-10-CM | POA: Diagnosis not present

## 2017-08-21 DIAGNOSIS — E785 Hyperlipidemia, unspecified: Secondary | ICD-10-CM

## 2017-08-21 DIAGNOSIS — M7989 Other specified soft tissue disorders: Secondary | ICD-10-CM | POA: Diagnosis not present

## 2017-08-21 DIAGNOSIS — L0390 Cellulitis, unspecified: Secondary | ICD-10-CM | POA: Diagnosis not present

## 2017-08-21 DIAGNOSIS — E0822 Diabetes mellitus due to underlying condition with diabetic chronic kidney disease: Secondary | ICD-10-CM | POA: Diagnosis not present

## 2017-08-21 DIAGNOSIS — I5032 Chronic diastolic (congestive) heart failure: Secondary | ICD-10-CM | POA: Diagnosis not present

## 2017-08-21 DIAGNOSIS — Z79899 Other long term (current) drug therapy: Secondary | ICD-10-CM

## 2017-08-21 DIAGNOSIS — L98499 Non-pressure chronic ulcer of skin of other sites with unspecified severity: Secondary | ICD-10-CM | POA: Diagnosis not present

## 2017-08-21 DIAGNOSIS — N184 Chronic kidney disease, stage 4 (severe): Secondary | ICD-10-CM

## 2017-08-21 LAB — BASIC METABOLIC PANEL
BUN / CREAT RATIO: 20 (ref 12–28)
BUN: 37 mg/dL — AB (ref 8–27)
CO2: 25 mmol/L (ref 20–29)
CREATININE: 1.86 mg/dL — AB (ref 0.57–1.00)
Calcium: 8.9 mg/dL (ref 8.7–10.3)
Chloride: 103 mmol/L (ref 96–106)
GFR calc Af Amer: 29 mL/min/{1.73_m2} — ABNORMAL LOW (ref >59)
GFR, EST NON AFRICAN AMERICAN: 25 mL/min/{1.73_m2} — AB (ref >59)
Glucose: 129 mg/dL — ABNORMAL HIGH (ref 65–99)
Potassium: 3.8 mmol/L (ref 3.5–5.2)
SODIUM: 142 mmol/L (ref 134–144)

## 2017-08-21 LAB — CBC
HEMATOCRIT: 35.7 % (ref 34.0–46.6)
Hemoglobin: 12 g/dL (ref 11.1–15.9)
MCH: 26.6 pg (ref 26.6–33.0)
MCHC: 33.6 g/dL (ref 31.5–35.7)
MCV: 79 fL (ref 79–97)
PLATELETS: 247 10*3/uL (ref 150–450)
RBC: 4.51 x10E6/uL (ref 3.77–5.28)
RDW: 12.7 % (ref 12.3–15.4)
WBC: 6.9 10*3/uL (ref 3.4–10.8)

## 2017-08-21 MED ORDER — HYDRALAZINE HCL 25 MG PO TABS: 75.0000 mg | ORAL_TABLET | Freq: Three times a day (TID) | ORAL | 3 refills | Status: DC

## 2017-08-21 MED ORDER — POTASSIUM CHLORIDE CRYS ER 20 MEQ PO TBCR: 40.0000 meq | EXTENDED_RELEASE_TABLET | Freq: Every day | ORAL | 11 refills | Status: DC

## 2017-08-21 MED ORDER — FUROSEMIDE 40 MG PO TABS: 80.0000 mg | ORAL_TABLET | Freq: Two times a day (BID) | ORAL | 3 refills | Status: DC

## 2017-08-21 NOTE — Patient Instructions (Signed)
Medication Instructions:  Your physician has recommended you make the following change in your medication:   1. INCREASE LASIX TO (80 MG) TWICE DAILY FOR THREE DAYS AND THEN GO BACK TO (80 MG) IN THE AM AND (40 MG) IN THE PM.  2. INCREASE POTASSIUM TWO TABLETS (40 MEQ) DAILY FOR THREE DAYS, THEN GO BACK TO  (20 MEQ) DAILY.  3. INCREASE HYDRALAZINE TO (75 MG) THREE TIMES A DAY.   Labwork: TODAY: BMET, CBC  Testing/Procedures: NONE ORDERED TODAY  Follow-Up:Your physician wants you to follow-up in: [place]. Tamala Julian. You will receive a reminder letter in the mail two months in advance. If you don't receive a letter, please call our office to schedule the follow-up appointment.    Any Other Special Instructions Will Be Listed Below (If Applicable). IF SWELLING DOES NOT GO DOWN, PLEASE CALL OUR OFFICE AT [phone] TO ADJUST MEDICATIONS FURTHER.     If you need a refill on your cardiac medications before your next appointment, please call your pharmacy.

## 2017-08-21 NOTE — Telephone Encounter (Signed)
-----   Message from Ledora Bottcher, [place] sent at 08/21/2017  4:17 PM EDT ----- Your renal function appears to be stable. Increase lasix for three days as we discussed. If your leg swelling improves, you may continue to use the lasix 80 mg BID and potassium 40 mEq BID. I would like to repeat your labs in 2 weeks - repeat BMP.

## 2017-08-26 DIAGNOSIS — I872 Venous insufficiency (chronic) (peripheral): Secondary | ICD-10-CM | POA: Diagnosis not present

## 2017-08-26 DIAGNOSIS — I83029 Varicose veins of left lower extremity with ulcer of unspecified site: Secondary | ICD-10-CM | POA: Diagnosis not present

## 2017-09-03 DIAGNOSIS — E1122 Type 2 diabetes mellitus with diabetic chronic kidney disease: Secondary | ICD-10-CM | POA: Diagnosis not present

## 2017-09-03 DIAGNOSIS — I272 Pulmonary hypertension, unspecified: Secondary | ICD-10-CM | POA: Diagnosis not present

## 2017-09-03 DIAGNOSIS — L97329 Non-pressure chronic ulcer of left ankle with unspecified severity: Secondary | ICD-10-CM | POA: Diagnosis not present

## 2017-09-03 DIAGNOSIS — D509 Iron deficiency anemia, unspecified: Secondary | ICD-10-CM | POA: Diagnosis not present

## 2017-09-03 DIAGNOSIS — I480 Paroxysmal atrial fibrillation: Secondary | ICD-10-CM | POA: Diagnosis not present

## 2017-09-03 DIAGNOSIS — N183 Chronic kidney disease, stage 3 (moderate): Secondary | ICD-10-CM | POA: Diagnosis not present

## 2017-09-03 DIAGNOSIS — I5032 Chronic diastolic (congestive) heart failure: Secondary | ICD-10-CM | POA: Diagnosis not present

## 2017-09-03 DIAGNOSIS — I872 Venous insufficiency (chronic) (peripheral): Secondary | ICD-10-CM | POA: Diagnosis not present

## 2017-09-03 DIAGNOSIS — E1151 Type 2 diabetes mellitus with diabetic peripheral angiopathy without gangrene: Secondary | ICD-10-CM | POA: Diagnosis not present

## 2017-09-03 DIAGNOSIS — I130 Hypertensive heart and chronic kidney disease with heart failure and stage 1 through stage 4 chronic kidney disease, or unspecified chronic kidney disease: Secondary | ICD-10-CM | POA: Diagnosis not present

## 2017-09-04 ENCOUNTER — Other Ambulatory Visit: Payer: Medicare Other | Admitting: *Deleted

## 2017-09-04 DIAGNOSIS — Z79899 Other long term (current) drug therapy: Secondary | ICD-10-CM | POA: Diagnosis not present

## 2017-09-04 LAB — BASIC METABOLIC PANEL
BUN/Creatinine Ratio: 22 (ref 12–28)
BUN: 40 mg/dL — ABNORMAL HIGH (ref 8–27)
CO2: 23 mmol/L (ref 20–29)
Calcium: 8.8 mg/dL (ref 8.7–10.3)
Chloride: 105 mmol/L (ref 96–106)
Creatinine, Ser: 1.83 mg/dL — ABNORMAL HIGH (ref 0.57–1.00)
GFR calc Af Amer: 29 mL/min/{1.73_m2} — ABNORMAL LOW (ref >59)
GFR calc non Af Amer: 26 mL/min/{1.73_m2} — ABNORMAL LOW (ref >59)
Glucose: 125 mg/dL — ABNORMAL HIGH (ref 65–99)
Potassium: 3.8 mmol/L (ref 3.5–5.2)
Sodium: 140 mmol/L (ref 134–144)

## 2017-09-17 ENCOUNTER — Other Ambulatory Visit: Payer: Self-pay | Admitting: Family Medicine

## 2017-09-17 DIAGNOSIS — Z1231 Encounter for screening mammogram for malignant neoplasm of breast: Secondary | ICD-10-CM

## 2017-09-18 ENCOUNTER — Encounter (INDEPENDENT_AMBULATORY_CARE_PROVIDER_SITE_OTHER): Payer: Self-pay

## 2017-09-18 ENCOUNTER — Ambulatory Visit: Payer: Medicare Other | Admitting: *Deleted

## 2017-09-18 DIAGNOSIS — Z5181 Encounter for therapeutic drug level monitoring: Secondary | ICD-10-CM

## 2017-09-18 DIAGNOSIS — I4891 Unspecified atrial fibrillation: Secondary | ICD-10-CM | POA: Diagnosis not present

## 2017-09-18 LAB — POCT INR: INR: 2.1 (ref 2.0–3.0)

## 2017-09-18 NOTE — Patient Instructions (Signed)
Description   Continue on same dosage 1 tablet daily except 1.5 tablets on Wednesdays and Fridays. Recheck INR in 6 weeks.  [facility] [phone]

## 2017-10-06 ENCOUNTER — Ambulatory Visit: Payer: Medicare Other | Admitting: Sports Medicine

## 2017-10-12 ENCOUNTER — Other Ambulatory Visit: Payer: Self-pay | Admitting: *Deleted

## 2017-10-12 MED ORDER — WARFARIN SODIUM 5 MG PO TABS: ORAL_TABLET | ORAL | 3 refills | Status: DC

## 2017-10-14 ENCOUNTER — Ambulatory Visit
Admission: RE | Admit: 2017-10-14 | Discharge: 2017-10-14 | Disposition: A | Discharge status: Home / Self Care | Payer: Medicare Other | Source: Ambulatory Visit | Attending: Family Medicine | Admitting: Family Medicine

## 2017-10-14 DIAGNOSIS — Z1231 Encounter for screening mammogram for malignant neoplasm of breast: Secondary | ICD-10-CM

## 2017-10-16 ENCOUNTER — Other Ambulatory Visit: Payer: Self-pay | Admitting: Endocrinology

## 2017-10-16 DIAGNOSIS — E1022 Type 1 diabetes mellitus with diabetic chronic kidney disease: Secondary | ICD-10-CM

## 2017-10-20 ENCOUNTER — Ambulatory Visit: Payer: Medicare Other | Admitting: Sports Medicine

## 2017-10-23 ENCOUNTER — Ambulatory Visit: Payer: Medicare Other | Admitting: Endocrinology

## 2017-10-26 ENCOUNTER — Telehealth: Payer: Self-pay | Admitting: Interventional Cardiology

## 2017-10-26 NOTE — Telephone Encounter (Signed)
Pt states that about 2 to 3 days ago her heart skip beats. Now she feels washed out has no energy. When asking if she had rested slept well during the night. She said that she does not sleep well. Also she feels bloating on the right side of her abdomen stomach area like she needs to burp. Pt states that she always have had problems with bloating.  Pt  takes Nexium and [place] for gas and bloating. This medication works  for a while. The symptoms starts back again. I suggested for her to call her PCP to see what he/she recommends. Pt also said that she was taking Amiodarone 100 mg a day  Metoprolol 50 mg BID. And maybe she needs to take the 200 mg Amiodarone instead. Pt is aware that this message will be send to MD for recommendations.

## 2017-10-26 NOTE — Telephone Encounter (Signed)
I would recommend she have a 12 lead Ecg done to make sure she is not in Afib. I would not increase her amiodarone dose now. We need to see what her rhythm is.Marland Kitchen  Janalynn Eder Martinique MD, [facility]

## 2017-10-26 NOTE — Telephone Encounter (Signed)
New Message       Patient c/o Palpitations:  High priority if patient c/o lightheadedness, shortness of breath, or chest pain  1) How long have you had palpitations/irregular HR/ Afib? Are you having the symptoms now? 3 to 4 days ago/heart skipped a beat  2) Are you currently experiencing lightheadedness, SOB or CP? No   3) Do you have a history of afib (atrial fibrillation) or irregular heart rhythm? Yes  4) Have you checked your BP or HR? (document readings if available): No  5) Are you experiencing any other symptoms? No              Patient states she feels washed out and bloated. Patient also states while trying to fix a meal she has to rest in between times. Pls advise

## 2017-10-27 NOTE — Telephone Encounter (Signed)
Attempted to contact patient, but there was no answer. VM full, unable to leave message.

## 2017-10-28 NOTE — Telephone Encounter (Signed)
Attempted to call Pt.  Went to VM, VM full.  Per DPR may speak with daughter.  Spoke with daughter, daughter states Pt felt her heart skip one beat 2 weeks ago. Pt has had no further skipped beats.  Advised daughter if Pt has anymore feelings of skipped beats to call office and we would have Pt come in for an EKG.  Daughter indicates understanding.  Per daughter it takes Pt a while to get to her phone.  Daughter asks that we call her phone, then when it goes to VM hang up and call back so that Pt can get to phone.  Advised daughter this nurse would add that to Pt permanent comments.  Advised to call office if any further concerns.

## 2017-10-28 NOTE — Telephone Encounter (Signed)
Received call back from Pt.  Per Pt her bowel situation is improved, but she still doesn't feel right in her heart.  Says she feels weak and short of breath.  Advised Pt to come to office 10/29/2017 for nurse visit for an EKG.  Pt has a coumadin appt already scheduled for Friday-discussed with coumadin nurse.  Was able to reschedule Pt for tomorrow.  Will have Pt come for coumadin check at 11:30 am- after check will get 12 lead EKG to see if Pt in afib.  Pt very thankful for help.

## 2017-10-29 ENCOUNTER — Ambulatory Visit (INDEPENDENT_AMBULATORY_CARE_PROVIDER_SITE_OTHER): Payer: Medicare Other

## 2017-10-29 ENCOUNTER — Ambulatory Visit (INDEPENDENT_AMBULATORY_CARE_PROVIDER_SITE_OTHER): Payer: Medicare Other | Admitting: *Deleted

## 2017-10-29 VITALS — BP 120/68 | HR 95 | Ht 65.0 in | Wt 191.7 lb

## 2017-10-29 DIAGNOSIS — Z5181 Encounter for therapeutic drug level monitoring: Secondary | ICD-10-CM

## 2017-10-29 DIAGNOSIS — I480 Paroxysmal atrial fibrillation: Secondary | ICD-10-CM | POA: Diagnosis not present

## 2017-10-29 DIAGNOSIS — I4891 Unspecified atrial fibrillation: Secondary | ICD-10-CM

## 2017-10-29 LAB — POCT INR: INR: 2 (ref 2.0–3.0)

## 2017-10-29 MED ORDER — AMIODARONE HCL 200 MG PO TABS: 200.0000 mg | ORAL_TABLET | Freq: Every day | ORAL | 3 refills | Status: DC

## 2017-10-29 NOTE — Patient Instructions (Signed)
Description   Today take 1.5 tablets then continue on same dosage 1 tablet daily except 1.5 tablets on Wednesdays and Fridays. Recheck INR in 6 weeks.  [facility]in Clinic [phone]

## 2017-10-29 NOTE — Progress Notes (Signed)
1.) Reason for visit:  Pt has been feeling short of breath and easily fatigued.   Pt called office on 10/26/2017 to report her heart skipped a beat and then the above symptoms started.  2.) Name of MD requesting visit: This is a patient of Dr. Thompson Caul.  Dr. Martinique covering.  Per Dr. Donnajean Lopes Pt come for nurse visit for a 12 lead EKG.  3.) H&P: Patient has been on amiodarone 100 mg daily for paroxysmal afib.  Pt felt a skipped heart beat and has had fatigue and sob since that time (10/26/2017)  4.) ROS related to problem: EKG completed-Pt in afib  5.) Assessment and plan per MD: Per DOD- Pt in afib.  Have Pt increase her amiodarone to 200 mg daily.  Will have Pt see Dr. Tamala Julian or APP in 4 weeks for continued evaluation.  Pt is on warfarin, last 3 months INR's have been therapeutic.

## 2017-10-29 NOTE — Patient Instructions (Signed)
Medication Instructions:  Your physician has recommended you make the following change in your medication:  1.  Increase your amiodarone 200 mg- Take one tablet by mouth daily.  Labwork: None ordered.  Testing/Procedures: None ordered.  Follow-Up: Your physician wants you to follow-up in: 4 weeks with Dr. Tamala Julian or a member of Dr. Tamala Julian care team.  Any Other Special Instructions Will Be Listed Below (If Applicable).  If you need a refill on your cardiac medications before your next appointment, please call your pharmacy.

## 2017-10-30 DIAGNOSIS — N189 Chronic kidney disease, unspecified: Secondary | ICD-10-CM | POA: Diagnosis not present

## 2017-10-30 DIAGNOSIS — E1122 Type 2 diabetes mellitus with diabetic chronic kidney disease: Secondary | ICD-10-CM | POA: Diagnosis not present

## 2017-10-30 DIAGNOSIS — N183 Chronic kidney disease, stage 3 (moderate): Secondary | ICD-10-CM | POA: Diagnosis not present

## 2017-10-30 DIAGNOSIS — D631 Anemia in chronic kidney disease: Secondary | ICD-10-CM | POA: Diagnosis not present

## 2017-10-30 DIAGNOSIS — N2581 Secondary hyperparathyroidism of renal origin: Secondary | ICD-10-CM | POA: Diagnosis not present

## 2017-11-18 DIAGNOSIS — E1122 Type 2 diabetes mellitus with diabetic chronic kidney disease: Secondary | ICD-10-CM | POA: Diagnosis not present

## 2017-11-18 DIAGNOSIS — I5030 Unspecified diastolic (congestive) heart failure: Secondary | ICD-10-CM | POA: Diagnosis not present

## 2017-11-18 DIAGNOSIS — N184 Chronic kidney disease, stage 4 (severe): Secondary | ICD-10-CM | POA: Diagnosis not present

## 2017-11-18 DIAGNOSIS — I480 Paroxysmal atrial fibrillation: Secondary | ICD-10-CM | POA: Diagnosis not present

## 2017-11-20 ENCOUNTER — Ambulatory Visit (INDEPENDENT_AMBULATORY_CARE_PROVIDER_SITE_OTHER): Payer: Medicare Other | Admitting: Podiatry

## 2017-11-20 ENCOUNTER — Encounter: Payer: Self-pay | Admitting: Podiatry

## 2017-11-20 DIAGNOSIS — E1151 Type 2 diabetes mellitus with diabetic peripheral angiopathy without gangrene: Secondary | ICD-10-CM | POA: Diagnosis not present

## 2017-11-20 DIAGNOSIS — M79675 Pain in left toe(s): Secondary | ICD-10-CM

## 2017-11-20 DIAGNOSIS — B351 Tinea unguium: Secondary | ICD-10-CM

## 2017-11-20 DIAGNOSIS — M79674 Pain in right toe(s): Secondary | ICD-10-CM

## 2017-11-20 DIAGNOSIS — E1142 Type 2 diabetes mellitus with diabetic polyneuropathy: Secondary | ICD-10-CM

## 2017-11-20 LAB — HM DIABETES FOOT EXAM: HM DIABETIC FOOT EXAM: NORMAL

## 2017-11-24 ENCOUNTER — Encounter: Payer: Self-pay | Admitting: Podiatry

## 2017-11-24 NOTE — Progress Notes (Signed)
Subjective: [name] presents today for follow up of at-risk foot care with PAD, diabetes, diabetic neuropathy and cc of painful, discolored, thick toenails which interfere with daily activities and routine tasks. Pain is aggravated when wearing enclosed shoe gear. Pain is relieved with periodic professional debridement.  She is accompanied by her daughter and husband. Daughter is the primary caretaker and voices no new pedal concerns on today's visit.  [name] is followed by Cardiology and monitored for anticoagulation (Warfarin sodium) for chronic Afib, diastolic CHF, HTN and long term use of amiodarone. She is followed by Endocrinology for her diabetes. Last A1c 5.29 May 2017.  Objective: Vascular Examination: Capillary refill time <4 seconds x 10 digits Mild varicosities noted BLE Dorsalis pedis pulses absent b/l Posterior tibial pulses absent b/l No digital hair x 10 digits 1+ edema LLE with no pain in calf Skin temperature warm to cool b/l  Dermatological Examination: No open wounds b/l LE Skin is warm, dry and supple b/l Toenails 1-5 b/l discolored, thick, dystrophic with subungual debris and pain with palpation to nailbeds due to thickness of nails.  Musculoskeletal: Muscle strength 5/5 to all LE muscle groups  Neurological: Sensation intact with 10 gram monofilament. Vibratory sensation diminished  Assessment: 1. Painful onychomycosis toenails 1-5 b/l 2. NIDDM with Peripheral arterial disease 3. Diabetic neuropathy  Plan:  1. Examined patient. Continue diabetic foot care principles. Continue to inspect feet daily. 2. Toenails 1-5 b/l were debrided in length and girth without iatrogenic bleeding. 3. Patient to continue soft, supportive shoe gear 4. Patient to report any pedal injuries to medical professional  5. Follow up 3 months. Patient/POA to call should there be a concern in the interim.

## 2017-11-26 ENCOUNTER — Ambulatory Visit (INDEPENDENT_AMBULATORY_CARE_PROVIDER_SITE_OTHER): Payer: Medicare Other | Admitting: *Deleted

## 2017-11-26 ENCOUNTER — Encounter: Payer: Self-pay | Admitting: Cardiology

## 2017-11-26 ENCOUNTER — Ambulatory Visit (INDEPENDENT_AMBULATORY_CARE_PROVIDER_SITE_OTHER): Payer: Medicare Other | Admitting: Cardiology

## 2017-11-26 VITALS — BP 142/76 | HR 98 | Ht 65.0 in | Wt 188.0 lb

## 2017-11-26 DIAGNOSIS — I4891 Unspecified atrial fibrillation: Secondary | ICD-10-CM

## 2017-11-26 DIAGNOSIS — Z5181 Encounter for therapeutic drug level monitoring: Secondary | ICD-10-CM

## 2017-11-26 DIAGNOSIS — I481 Persistent atrial fibrillation: Secondary | ICD-10-CM | POA: Diagnosis not present

## 2017-11-26 DIAGNOSIS — I4819 Other persistent atrial fibrillation: Secondary | ICD-10-CM

## 2017-11-26 DIAGNOSIS — I10 Essential (primary) hypertension: Secondary | ICD-10-CM

## 2017-11-26 DIAGNOSIS — I5032 Chronic diastolic (congestive) heart failure: Secondary | ICD-10-CM

## 2017-11-26 DIAGNOSIS — Z7901 Long term (current) use of anticoagulants: Secondary | ICD-10-CM | POA: Diagnosis not present

## 2017-11-26 LAB — POCT INR: INR: 2.4 (ref 2.0–3.0)

## 2017-11-26 NOTE — Patient Instructions (Signed)
Description   Continue on same dosage 1 tablet daily except 1.5 tablets on Wednesdays and Fridays. Recheck INR in 3 weeks with MD appt.  Coumadin Clinic [phone]

## 2017-11-26 NOTE — Progress Notes (Signed)
Cardiology Office Note   Date:  11/27/2017   ID:  MAURIA ASQUITH, DOB 08/02/1933, MRN [id number]  PCP:  Aretta Nip, MD  Cardiologist:  Dr. Tamala Julian     Chief Complaint  Patient presents with  . Atrial Fibrillation      History of Present Illness: [name] is a 84 y.o. female who presents for atrial fib.   She has a hx of paroxysmal atrial fibrillation on amiodarone and lopressor and coumadin, HTN, and chronic diastolic heart failure. She last saw Dr. Tamala Julian in clinic on 01/20/17. At that time, she was doing well on low dose amiodarone 100 mg daily. TSH and LFTs were WNL.  She had some increased swelling in her left lower extremity. She has seen podiatry for this and they did not think it was infected. She is putting neosporin on it twice per week. She does not complain of pain in her leg, but it is tender to the touch on the back of her ankle. She has seen vascular surgery in 2016, who noted chronic venous insufficiency for 6-8 years and moderate arterial insufficiency but was asymptomatic at that time.   Recently seen for EKG check due to skipped heart beats.  She was found to be in a fib.  Her amiodarone was increased to 200 mg from 100 mg.  She is back today for follow up.  Feels much better.  No chest pain and no SOB and not aware of skipped heart beats - though she is still in a fib.   Past Medical History:  Diagnosis Date  . Anemia due to blood loss, chronic 08/23/2013  . Atrial fibrillation ([place]) 12/09/2012  . Candidal skin infection 12/23/2012  . Chronic diastolic heart failure ([place]) 08/23/2013  . Chronic kidney disease, stage IV (severe) ([place]) 12/09/2012  . Diabetes mellitus with renal complications ([place]) 28/06/1322  . Diabetes mellitus without complication ([place])   . Dyslipidemia 12/09/2012  . Fall at home Sept. 3, 2016   Fx  3 ribs  . GERD (gastroesophageal reflux disease) 12/23/2012  . HTN (hypertension) 12/09/2012  . Hyperlipidemia 12/23/2012  . Hypertension   .  Ileus, postoperative ([place]) 12/17/2012  . Intractable pain 12/03/2014  . Irregular heart beat   . Long term current use of anticoagulant therapy 02/22/2013  . On amiodarone therapy 02/22/2013  . Peptic esophagitis 08/23/2013  . Pressure injury of skin 05/21/2016  . Renal disorder   . Type II diabetes mellitus, uncontrolled ([place]) 09/06/2013  . Umbilical hernia, incarcerated - reducible 12/10/2012    Past Surgical History:  Procedure Laterality Date  . BREAST LUMPECTOMY Right 2001   2 lumpectomies with radiation both 2001  . CATARACT EXTRACTION, BILATERAL    . HERNIA REPAIR    . I&D EXTREMITY Right 05/19/2016   Procedure: MINOR IRRIGATION AND DEBRIDEMENT Right great toe;  Surgeon: Gaynelle Arabian, MD;  Location: [facility];  Service: Orthopedics;  Laterality: Right;  . INSERTION OF MESH N/A 12/12/2012   Procedure: INSERTION OF MESH;  Surgeon: Madilyn Hook, DO;  Location: [facility];  Service: General;  Laterality: N/A;  . LAPAROSCOPIC LYSIS OF ADHESIONS N/A 12/12/2012   Procedure: LAPAROSCOPIC LYSIS OF ADHESIONS;  Surgeon: Madilyn Hook, DO;  Location: [facility];  Service: General;  Laterality: N/A;  . Right Lumpectomy    . VENTRAL HERNIA REPAIR N/A 12/12/2012   Procedure: LAPAROSCOPIC VENTRAL HERNIA;  Surgeon: Madilyn Hook, DO;  [facility];  Service: General;  Laterality: N/A;     Current  Outpatient Medications  Medication Sig Dispense Refill  . acidophilus (RISAQUAD) CAPS capsule Take 1 capsule by mouth daily as needed (GI issues).     Marland Kitchen amiodarone (PACERONE) 200 MG tablet Take 1 tablet (200 mg total) by mouth daily. 90 tablet 3  . amLODipine (NORVASC) 10 MG tablet Take 5 mg by mouth daily at 12 noon.   1  . atorvastatin (LIPITOR) 40 MG tablet Take 40 mg by mouth every evening.     . B-D UF III MINI PEN NEEDLES 31G X 5 MM MISC USE 3 PEN NEEDLES PER DAY 100 each 1  . colchicine 0.6 MG tablet Take 0.6 mg by mouth daily.     . Cranberry 250 MG CAPS Take 750 mg by mouth daily.     Marland Kitchen econazole nitrate 1  % cream Apply 1 application topically daily as needed (to groin rash).     Marland Kitchen esomeprazole (NEXIUM) 20 MG capsule Take 20 mg by mouth daily before breakfast.     . ferrous sulfate 325 (65 FE) MG tablet Take 325 mg by mouth daily with breakfast.    . fluticasone (FLONASE) 50 MCG/ACT nasal spray Place 2 sprays into both nostrils daily as needed for rhinitis.     . furosemide (LASIX) 40 MG tablet Take 2 tablets (80 mg total) by mouth 2 (two) times daily. 180 tablet 3  . hydrALAZINE (APRESOLINE) 25 MG tablet Take 3 tablets (75 mg total) by mouth 3 (three) times daily. 270 tablet 3  . insulin lispro (HUMALOG KWIKPEN) 100 UNIT/ML KiwkPen Inject 7 units before breakfast 5 pen 2  . KLOR-CON 10 10 MEQ tablet TAKE 3 TABLETS BY MOUTH EVERY MORNING AND TAKE 3 TABLETS BY MOUTH EVERY EVENING 180 tablet 6  . metolazone (ZAROXOLYN) 5 MG tablet Take 5 mg by mouth daily as needed (for edema).   3  . metoprolol (LOPRESSOR) 50 MG tablet Take 50 mg by mouth 2 (two) times daily.    Glory Rosebush VERIO test strip USE 1 STRIP TO CHECK GLUCOSE TWICE DAILY AS DIRECTED 100 each 3  . polyethylene glycol (MIRALAX / GLYCOLAX) packet Take 17 g by mouth daily as needed for mild constipation.    Marland Kitchen VICTOZA 18 MG/3ML SOPN INJECT 1.2MG  SUBCUTANEOUSLY DAILY 18 mL 1  . warfarin (COUMADIN) 5 MG tablet Take As Directed by Coumadin Clinic 40 tablet 3   No current facility-administered medications for this visit.     Allergies:   Lotensin [benazepril hcl]; Zantac [ranitidine hcl]; and Tape    Social History:  The patient  reports that she has quit smoking. Her smoking use included cigarettes. She has never used smokeless tobacco. She reports that she does not drink alcohol or use drugs.   Family History:  The patient's family history includes Diabetes in her mother and sister.    ROS:  General:no colds or fevers, no weight changes Skin:no rashes or ulcers- except chronic on lt lower ext. HEENT:no blurred vision, no congestion CV:see  HPI PUL:see HPI GI:no diarrhea constipation or melena, no indigestion GU:no hematuria, no dysuria MS:no joint pain, no claudication Neuro:no syncope, no lightheadedness Endo:+ diabetes stable, no thyroid disease  Wt Readings from Last 3 Encounters:  11/26/17 188 lb (85.3 kg)  10/29/17 191 lb 11.2 oz (87 kg)  08/21/17 189 lb 1.9 oz (85.8 kg)     PHYSICAL EXAM: VS:  BP (!) 142/76   Pulse 98   Ht 5\' 5"  (1.651 m)   Wt 188 lb (85.3 kg)  SpO2 96%   BMI 31.28 kg/m  , BMI Body mass index is 31.28 kg/m. General:Pleasant affect, NAD Skin:Warm and dry, brisk capillary refill HEENT:normocephalic, sclera clear, mucus membranes moist Neck:supple, no JVD, no bruits  Heart:ireg irreg without murmur, gallup, rub or click Lungs:clear without rales, rhonchi, or wheezes QQV:ZDGL, non tender, + BS, do not palpate liver spleen or masses Ext:2+ lower ext edema on left alone, 2+ radial pulses Neuro:alert and oriented X 3, MAE, follows commands, + facial symmetry    EKG:  EKG is ordered today. The ekg ordered today demonstrates A flutter with rate control at 98, though higher than her normal.  ST depression without change.    Recent Labs: 08/21/2017: Hemoglobin 12.0; Platelets 247 09/04/2017: BUN 40; Creatinine, Ser 1.83; Potassium 3.8; Sodium 140 11/26/2017: ALT 42; TSH 1.930    Lipid Panel    Component Value Date/Time   CHOL 112 07/18/2016 1005   TRIG 111.0 07/18/2016 1005   HDL 53.70 07/18/2016 1005   CHOLHDL 2 07/18/2016 1005   VLDL 22.2 07/18/2016 1005   LDLCALC 36 07/18/2016 1005   LDLDIRECT 37.8 10/27/2013 0808       Other studies Reviewed: Additional studies/ records that were reviewed today include: . Echo 2014.    Study Conclusions  - Left ventricle: The cavity size was normal. Systolic function was normal. The estimated ejection fraction was in the range of 60% to 65%. Although no diagnostic regional wall motion abnormality was identified, this possibility  cannot be completely excluded on the basis of this study. - Aortic valve: Mild regurgitation. - Mitral valve: Calcified annulus. Mildly thickened leaflets . Mild regurgitation. - Left atrium: The atrium was mildly dilated. - Pulmonary arteries: Systolic pressure was moderately increased. PA peak pressure: 25mm Hg (S). ASSESSMENT AND PLAN:  1.  Persistent a flutter rate controlled - on 200 mg amiodarone now, pt now aware of a flutter and no symptoms.  Will check with Dr. Tamala Julian if we should proceed with DCCV.  Her INRs have been therapeutic.  Will check echo.  She will keep follow up with Dr. Tamala Julian.   2.   Medication check- will eval TSH and hepatic panel, also with anticoagulation on higher dose of amiodarone will check INR.       3.   Chronic diastolic HF stable. Though lt lower ext with continued edema. No change.   4.    HTN controlled   5.  Chronic anticoagulation see above.    Current medicines are reviewed with the patient today.  The patient Has no concerns regarding medicines.  The following changes have been made:  See above Labs/ tests ordered today include:see above  Disposition:   FU:  see above  Signed, Cecilie Kicks, NP  11/27/2017 4:53 PM    [name] [address] [place], [place], [place], [place] Phone: [phone]; Fax: [phone]

## 2017-11-26 NOTE — Patient Instructions (Signed)
Medication Instructions:  1. Your physician recommends that you continue on your current medications as directed. Please refer to the Current Medication list given to you today.   Labwork: TODAY TSH, LFT  Testing/Procedures: Your physician has requested that you have an echocardiogram. Echocardiography is a painless test that uses sound waves to create images of your heart. It provides your doctor with information about the size and shape of your heart and how well your heart's chambers and valves are working. This procedure takes approximately one hour. There are no restrictions for this procedure.   Follow-Up: KEEP YOUR APPT WITH DR. Tamala Julian ON 12/16/17  Any Other Special Instructions Will Be Listed Below (If Applicable). WE WILL CHECK YOUR INR TODAY DUE TO YOUR AMIODARONE WAS INCREASED ON 10/29/17    If you need a refill on your cardiac medications before your next appointment, please call your pharmacy.

## 2017-11-27 ENCOUNTER — Encounter: Payer: Self-pay | Admitting: Cardiology

## 2017-11-27 ENCOUNTER — Telehealth: Payer: Self-pay

## 2017-11-27 DIAGNOSIS — R748 Abnormal levels of other serum enzymes: Secondary | ICD-10-CM

## 2017-11-27 LAB — HEPATIC FUNCTION PANEL
ALBUMIN: 3.7 g/dL (ref 3.5–4.7)
ALT: 42 IU/L — ABNORMAL HIGH (ref 0–32)
AST: 52 IU/L — ABNORMAL HIGH (ref 0–40)
Alkaline Phosphatase: 47 IU/L (ref 39–117)
Bilirubin Total: 0.3 mg/dL (ref 0.0–1.2)
Bilirubin, Direct: 0.14 mg/dL (ref 0.00–0.40)
TOTAL PROTEIN: 6.3 g/dL (ref 6.0–8.5)

## 2017-11-27 LAB — TSH: TSH: 1.93 u[IU]/mL (ref 0.450–4.500)

## 2017-11-27 NOTE — Telephone Encounter (Signed)
-----   Message from Isaiah Serge, NP sent at 11/27/2017  4:25 PM EDT ----- Thyroid is stable, liver enzymes are slightly elevated- will recheck in 4 weeks.  Hepatic panel.

## 2017-11-27 NOTE — Telephone Encounter (Signed)
Pt aware of lab results and recommendations. She agrees to have repeat liver enzymes drawn on 9/20 when she comes in for her echo.

## 2017-12-03 ENCOUNTER — Ambulatory Visit (INDEPENDENT_AMBULATORY_CARE_PROVIDER_SITE_OTHER): Payer: Medicare Other

## 2017-12-03 DIAGNOSIS — I4819 Other persistent atrial fibrillation: Secondary | ICD-10-CM

## 2017-12-03 DIAGNOSIS — Z5181 Encounter for therapeutic drug level monitoring: Secondary | ICD-10-CM | POA: Diagnosis not present

## 2017-12-03 DIAGNOSIS — I481 Persistent atrial fibrillation: Secondary | ICD-10-CM

## 2017-12-03 DIAGNOSIS — I4891 Unspecified atrial fibrillation: Secondary | ICD-10-CM | POA: Diagnosis not present

## 2017-12-03 LAB — POCT INR: INR: 2.2 (ref 2.0–3.0)

## 2017-12-03 NOTE — Patient Instructions (Signed)
Description   Continue on same dosage 1 tablet daily except 1.5 tablets on Wednesdays and Fridays. Recheck INR in 1 week.  [facility] [phone]

## 2017-12-08 ENCOUNTER — Ambulatory Visit (INDEPENDENT_AMBULATORY_CARE_PROVIDER_SITE_OTHER): Payer: Medicare Other | Admitting: Endocrinology

## 2017-12-08 ENCOUNTER — Encounter: Payer: Self-pay | Admitting: Endocrinology

## 2017-12-08 VITALS — BP 140/62 | HR 66 | Temp 98.8°F | Resp 18 | Ht 65.0 in | Wt 176.0 lb

## 2017-12-08 DIAGNOSIS — Z23 Encounter for immunization: Secondary | ICD-10-CM

## 2017-12-08 DIAGNOSIS — E1165 Type 2 diabetes mellitus with hyperglycemia: Secondary | ICD-10-CM

## 2017-12-08 DIAGNOSIS — Z794 Long term (current) use of insulin: Secondary | ICD-10-CM

## 2017-12-08 LAB — POCT GLYCOSYLATED HEMOGLOBIN (HGB A1C): HEMOGLOBIN A1C: 6.5 % — AB (ref 4.0–5.6)

## 2017-12-08 LAB — POCT INR: INR: 6.5 — AB (ref 2.0–3.0)

## 2017-12-08 NOTE — Progress Notes (Signed)
Patient ID: STEPHENE ALEGRIA, female   DOB: 23-Jan-1934, 84 y.o.   MRN: [id number]    Reason for Appointment: Follow-up for Type 2 Diabetes  Referring physician: Jordan Hawks Rankins  History of Present Illness:          Diagnosis: Type 2 diabetes mellitus, date of diagnosis: ? 25 years ago        Past history: She was initially treated with Metformin but this was done because it caused GI side effects  May also tried Actos at some point, probably started because of the fear of side effects but did not know if it helped Actos stopped ? Reason Over the last few years she has been taking Precose with meals and also Januvia in increasing doses Apparently her blood sugars have been higher since 9/14 when she had abdominal surgery and before that they were as low as 70. On her initial consultation in 6/15 because of her renal dysfunction her medication regimen was changed. Previously was taking Januvia, Precose and glipizide without adequate control; her blood sugars were occasionally over 200 fasting and A1c was 9.4 She was started on Victoza 0.6 mg and Lantus insulin 8 units a day but the Lantus had to be stopped because of relatively low fasting glucose.  Since she had tried Prandin and glipizide and her postprandial readings were as high as 330 she was started on Humalog at mealtimes in 8/15  Recent history:    She is taking Victoza at bedtime as her only treatment, not on insulin  Her A1c is relatively higher at 6.5, previously 5.8  Current blood sugar patterns, current management  and problems identified:  She did not bring her monitor  Although her A1c is relatively higher this may be related to her not taking Humalog anymore in the mornings since her readings were low normal after breakfast  She thinks her blood sugars are usually not more than 180 but not clear if she can recall all her readings  Fasting readings are usually well controlled with taking only Victoza  Also  she has been given the option of taking 5 units of Humalog if she is eating a dessert level okay  She appears to be eating less and losing weight  She has a relatively large breakfast, sometimes high in fat   Usually eating a 1/2 sandwich at lunch, sometimes less       Oral hypoglycemic drugs the patient is taking are: none  Side effects from medications have been: None   Glucose monitoring:  done once  a day        Glucometer:  One Touch Verio Blood sugars not available today, see above    Self-care:   Meals: 3 meals per day.   Breakfast: eggs, grits,meat;  sometimes 1/2 Boost; will have spaghetti or rice at supper. Dinner 6 pm           Exercise:  none, unable to          Dietician visit: Most recent: Unknown.               Weight history:  Wt Readings from Last 3 Encounters:  12/08/17 176 lb (79.8 kg)  11/26/17 188 lb (85.3 kg)  10/29/17 191 lb 11.2 oz (87 kg)   Glycemic control:  Lab Results  Component Value Date   HGBA1C 6.5 (A) 12/08/2017   HGBA1C 5.8 06/18/2017   HGBA1C 6.1 03/20/2017   Lab Results  Component Value Date   MICROALBUR  132.3 (H) 06/18/2017   LDLCALC 36 07/18/2016   CREATININE 1.83 (H) 09/04/2017     Allergies as of 12/08/2017      Reactions   Lotensin [benazepril Hcl] Anaphylaxis   Zantac [ranitidine Hcl] Anaphylaxis   Tape Itching, Rash      Medication List        Accurate as of 12/08/17  9:07 PM. Always use your most recent med list.          acidophilus Caps capsule Take 1 capsule by mouth daily as needed (GI issues).   amiodarone 200 MG tablet Commonly known as:  PACERONE Take 1 tablet (200 mg total) by mouth daily.   amLODipine 10 MG tablet Commonly known as:  NORVASC Take 5 mg by mouth daily at 12 noon.   atorvastatin 40 MG tablet Commonly known as:  LIPITOR Take 40 mg by mouth every evening.   B-D UF III MINI PEN NEEDLES 31G X 5 MM Misc Generic drug:  Insulin Pen Needle USE 3 PEN NEEDLES PER DAY   colchicine 0.6 MG  tablet Take 0.6 mg by mouth daily.   Cranberry 250 MG Caps Take 750 mg by mouth daily.   econazole nitrate 1 % cream Apply 1 application topically daily as needed (to groin rash).   esomeprazole 20 MG capsule Commonly known as:  NEXIUM Take 20 mg by mouth daily before breakfast.   ferrous sulfate 325 (65 FE) MG tablet Take 325 mg by mouth daily with breakfast.   fluticasone 50 MCG/ACT nasal spray Commonly known as:  FLONASE Place 2 sprays into both nostrils daily as needed for rhinitis.   furosemide 40 MG tablet Commonly known as:  LASIX Take 2 tablets (80 mg total) by mouth 2 (two) times daily.   hydrALAZINE 25 MG tablet Commonly known as:  APRESOLINE Take 3 tablets (75 mg total) by mouth 3 (three) times daily.   insulin lispro 100 UNIT/ML KiwkPen Commonly known as:  HUMALOG Inject 7 units before breakfast   KLOR-CON 10 10 MEQ tablet Generic drug:  potassium chloride TAKE 3 TABLETS BY MOUTH EVERY MORNING AND TAKE 3 TABLETS BY MOUTH EVERY EVENING   metolazone 5 MG tablet Commonly known as:  ZAROXOLYN Take 5 mg by mouth daily as needed (for edema).   metoprolol tartrate 50 MG tablet Commonly known as:  LOPRESSOR Take 50 mg by mouth 2 (two) times daily.   ONETOUCH VERIO test strip Generic drug:  glucose blood USE 1 STRIP TO CHECK GLUCOSE TWICE DAILY AS DIRECTED   polyethylene glycol packet Commonly known as:  MIRALAX / GLYCOLAX Take 17 g by mouth daily as needed for mild constipation.   VICTOZA 18 MG/3ML Sopn Generic drug:  liraglutide INJECT 1.2MG  SUBCUTANEOUSLY DAILY   warfarin 5 MG tablet Commonly known as:  COUMADIN Take as directed by the anticoagulation clinic. If you are unsure how to take this medication, talk to your nurse or doctor. Original instructions:  Take As Directed by Coumadin Clinic       Allergies:  Allergies  Allergen Reactions  . Lotensin [Benazepril Hcl] Anaphylaxis  . Zantac [Ranitidine Hcl] Anaphylaxis  . Tape Itching and  Rash    Past Medical History:  Diagnosis Date  . Anemia due to blood loss, chronic 08/23/2013  . Atrial fibrillation ([place]) 12/09/2012  . Candidal skin infection 12/23/2012  . Chronic diastolic heart failure ([place]) 08/23/2013  . Chronic kidney disease, stage IV (severe) ([place]) 12/09/2012  . Diabetes mellitus with renal complications ([place]) 11/30/8336  .  Diabetes mellitus without complication ([place])   . Dyslipidemia 12/09/2012  . Fall at home Sept. 3, 2016   Fx  3 ribs  . GERD (gastroesophageal reflux disease) 12/23/2012  . HTN (hypertension) 12/09/2012  . Hyperlipidemia 12/23/2012  . Hypertension   . Ileus, postoperative ([place]) 12/17/2012  . Intractable pain 12/03/2014  . Irregular heart beat   . Long term current use of anticoagulant therapy 02/22/2013  . On amiodarone therapy 02/22/2013  . Peptic esophagitis 08/23/2013  . Pressure injury of skin 05/21/2016  . Renal disorder   . Type II diabetes mellitus, uncontrolled ([place]) 09/06/2013  . Umbilical hernia, incarcerated - reducible 12/10/2012    Past Surgical History:  Procedure Laterality Date  . BREAST LUMPECTOMY Right 2001   2 lumpectomies with radiation both 2001  . CATARACT EXTRACTION, BILATERAL    . HERNIA REPAIR    . I&D EXTREMITY Right 05/19/2016   Procedure: MINOR IRRIGATION AND DEBRIDEMENT Right great toe;  Surgeon: Gaynelle Arabian, MD;  [facility];  Service: Orthopedics;  Laterality: Right;  . INSERTION OF MESH N/A 12/12/2012   Procedure: INSERTION OF MESH;  Surgeon: Madilyn Hook, DO;  [facility];  Service: General;  Laterality: N/A;  . LAPAROSCOPIC LYSIS OF ADHESIONS N/A 12/12/2012   Procedure: LAPAROSCOPIC LYSIS OF ADHESIONS;  Surgeon: Madilyn Hook, DO;  [facility];  Service: General;  Laterality: N/A;  . Right Lumpectomy    . VENTRAL HERNIA REPAIR N/A 12/12/2012   Procedure: LAPAROSCOPIC VENTRAL HERNIA;  Surgeon: Madilyn Hook, DO;  [facility];  Service: General;  Laterality: N/A;    Family History  Problem  Relation Age of Onset  . Diabetes Mother   . Diabetes Sister     Social History:  reports that she has quit smoking. Her smoking use included cigarettes. She has never used smokeless tobacco. She reports that she does not drink alcohol or use drugs.    Review of Systems        Lipids: She has been on high dose Lipitor for hyperlipidemia Her last LDL was 52 done in 04/2017      Lab Results  Component Value Date   CHOL 112 07/18/2016   HDL 53.70 07/18/2016   LDLCALC 36 07/18/2016   LDLDIRECT 37.8 10/27/2013   TRIG 111.0 07/18/2016   CHOLHDL 2 07/18/2016       The blood pressure has been treated with multiple medications including 10 mg amlodipine  Followed by other physicians Including cardiologist and blood pressure appears to be variable   BP Readings from Last 3 Encounters:  12/08/17 140/62  11/26/17 (!) 142/76  10/29/17 120/68     On amiodarone, This has not caused any change in thyroid functions Last TSH was in 10/18   Lab Results  Component Value Date   TSH 1.930 11/26/2017       She has had history of swelling of legs and is on Lasix      Chronic kidney disease followed by nephrologist   Most recent creatinine was 2.9   Lab Results  Component Value Date   CREATININE 1.83 (H) 09/04/2017   Last diabetic foot exam in 2/17     She has weakness in her legs since 2014; needs a walker and cannot walk Much  Physical Examination:  BP 140/62 (BP Location: Left Arm, Cuff Size: Normal)   Pulse 66   Temp 98.8 F (37.1 C) (Oral)   Resp 18   Ht 5\' 5"  (1.651 m)   Wt 176  lb (79.8 kg)   BMI 29.29 kg/m     ASSESSMENT:  Diabetes type 2, long-standing See history of present illness for detailed discussion of his current management, blood sugar patterns and problems identified  She has excellent A1c although this is relatively higher than before but this may be related to not taking any insulin at breakfast since her last visit She claims her blood sugars  are excellent at home but did not bring her monitor Considering her age and multiple problems she is still doing fairly well with Victoza alone   RENAL dysfunction: Followed by nephrologist and PCP with some recent worsening   HYPERTENSION: Her blood pressure is controlled, to follow-up with her PCP and cardiologist   PLAN:  Take Victoza unchanged May still use Humalog when eating a large meal or adding dessert to a meal Discussed trying to rotate glucose monitoring times and doing some readings after breakfast and evening meal To call if blood sugars are consistently high  Patient Instructions  Check blood sugars on waking up 2/7   Also check blood sugars about 2 hours after a meal and do this after different meals by rotation  Recommended blood sugar levels on waking up is 90-130 and about 2 hours after meal is 130-160  Please bring your blood sugar monitor to each visit, thank you     Influenza vaccine given and CDC information provided  Elayne Snare 12/08/2017, 9:07 PM   Note: This office note was prepared with Dragon voice recognition system technology. Any transcriptional errors that result from this process are unintentional.

## 2017-12-08 NOTE — Patient Instructions (Signed)
Check blood sugars on waking up  2/7  Also check blood sugars about 2 hours after a meal and do this after different meals by rotation  Recommended blood sugar levels on waking up is 90-130 and about 2 hours after meal is 130-160  Please bring your blood sugar monitor to each visit, thank you  

## 2017-12-11 ENCOUNTER — Ambulatory Visit (INDEPENDENT_AMBULATORY_CARE_PROVIDER_SITE_OTHER): Payer: Medicare Other | Admitting: *Deleted

## 2017-12-11 ENCOUNTER — Other Ambulatory Visit (HOSPITAL_COMMUNITY): Payer: Medicare Other

## 2017-12-11 ENCOUNTER — Ambulatory Visit (HOSPITAL_COMMUNITY): Payer: Medicare Other | Attending: Cardiology

## 2017-12-11 ENCOUNTER — Other Ambulatory Visit: Payer: Self-pay

## 2017-12-11 DIAGNOSIS — I4891 Unspecified atrial fibrillation: Secondary | ICD-10-CM | POA: Diagnosis not present

## 2017-12-11 DIAGNOSIS — N2581 Secondary hyperparathyroidism of renal origin: Secondary | ICD-10-CM | POA: Diagnosis not present

## 2017-12-11 DIAGNOSIS — Z87891 Personal history of nicotine dependence: Secondary | ICD-10-CM | POA: Diagnosis not present

## 2017-12-11 DIAGNOSIS — E1122 Type 2 diabetes mellitus with diabetic chronic kidney disease: Secondary | ICD-10-CM | POA: Insufficient documentation

## 2017-12-11 DIAGNOSIS — N184 Chronic kidney disease, stage 4 (severe): Secondary | ICD-10-CM | POA: Diagnosis not present

## 2017-12-11 DIAGNOSIS — I481 Persistent atrial fibrillation: Secondary | ICD-10-CM

## 2017-12-11 DIAGNOSIS — I4819 Other persistent atrial fibrillation: Secondary | ICD-10-CM

## 2017-12-11 DIAGNOSIS — I5032 Chronic diastolic (congestive) heart failure: Secondary | ICD-10-CM | POA: Diagnosis not present

## 2017-12-11 DIAGNOSIS — E785 Hyperlipidemia, unspecified: Secondary | ICD-10-CM | POA: Insufficient documentation

## 2017-12-11 DIAGNOSIS — I130 Hypertensive heart and chronic kidney disease with heart failure and stage 1 through stage 4 chronic kidney disease, or unspecified chronic kidney disease: Secondary | ICD-10-CM | POA: Insufficient documentation

## 2017-12-11 DIAGNOSIS — I083 Combined rheumatic disorders of mitral, aortic and tricuspid valves: Secondary | ICD-10-CM | POA: Insufficient documentation

## 2017-12-11 DIAGNOSIS — N189 Chronic kidney disease, unspecified: Secondary | ICD-10-CM | POA: Diagnosis not present

## 2017-12-11 DIAGNOSIS — Z5181 Encounter for therapeutic drug level monitoring: Secondary | ICD-10-CM

## 2017-12-11 DIAGNOSIS — D631 Anemia in chronic kidney disease: Secondary | ICD-10-CM | POA: Diagnosis not present

## 2017-12-11 LAB — POCT INR: INR: 2.7 (ref 2.0–3.0)

## 2017-12-11 NOTE — Patient Instructions (Signed)
Description   Continue on same dosage 1 tablet daily except 1.5 tablets on Wednesdays and Fridays. Recheck INR in 3 weeks.  [facility] [phone]

## 2017-12-15 NOTE — Progress Notes (Signed)
Cardiology Office Note:    Date:  12/16/2017   ID:  [name], DOB 06/15/1933, MRN [id number]  PCP:  Aretta Nip, MD  Cardiologist:  Sinclair Grooms, MD   Referring MD: Aretta Nip, MD   Chief Complaint  Patient presents with  . Atrial Fibrillation  . Congestive Heart Failure    History of Present Illness:    [name] is a 84 y.o. female with a hx of Paroxysmal atrial fib, amiodarone therapy, chronic diastolic heart failure, elderly and frail.  Now with persistent atrial fibrillation for consideration of electrical cardioversion.  Over the past 2 weeks she has felt better.  Able to get up and prepare food at home.  Breathing is not a problem.  No edema.  Only change his plan to increase amiodarone to 200 mg/day.  She is here to be set up for electrical cardioversion he spent significant time discussing cardioversion and potential risks.  Also discussed the impact of cardioversion on long-term recurrence of atrial fibrillation -helped her to understand that the procedure itself does not provide any permanent protection against recurrence or benefit.  We discussed the pros and cons of higher dose long-term amiodarone continuation.  Past Medical History:  Diagnosis Date  . Anemia due to blood loss, chronic 08/23/2013  . Atrial fibrillation ([place]) 12/09/2012  . Candidal skin infection 12/23/2012  . Chronic diastolic heart failure ([place]) 08/23/2013  . Chronic kidney disease, stage IV (severe) ([place]) 12/09/2012  . Diabetes mellitus with renal complications ([place]) 11/29/3530  . Diabetes mellitus without complication ([place])   . Dyslipidemia 12/09/2012  . Fall at home Sept. 3, 2016   Fx  3 ribs  . GERD (gastroesophageal reflux disease) 12/23/2012  . HTN (hypertension) 12/09/2012  . Hyperlipidemia 12/23/2012  . Hypertension   . Ileus, postoperative ([place]) 12/17/2012  . Intractable pain 12/03/2014  . Irregular heart beat   . Long term current use of anticoagulant therapy  02/22/2013  . On amiodarone therapy 02/22/2013  . Peptic esophagitis 08/23/2013  . Pressure injury of skin 05/21/2016  . Renal disorder   . Type II diabetes mellitus, uncontrolled ([place]) 09/06/2013  . Umbilical hernia, incarcerated - reducible 12/10/2012    Past Surgical History:  Procedure Laterality Date  . BREAST LUMPECTOMY Right 2001   2 lumpectomies with radiation both 2001  . CATARACT EXTRACTION, BILATERAL    . HERNIA REPAIR    . I&D EXTREMITY Right 05/19/2016   Procedure: MINOR IRRIGATION AND DEBRIDEMENT Right great toe;  Surgeon: Gaynelle Arabian, MD;  Location: [facility];  Service: Orthopedics;  Laterality: Right;  . INSERTION OF MESH N/A 12/12/2012   Procedure: INSERTION OF MESH;  Surgeon: Madilyn Hook, DO;  Location: [facility];  Service: General;  Laterality: N/A;  . LAPAROSCOPIC LYSIS OF ADHESIONS N/A 12/12/2012   Procedure: LAPAROSCOPIC LYSIS OF ADHESIONS;  Surgeon: Madilyn Hook, DO;  Location: [facility];  Service: General;  Laterality: N/A;  . Right Lumpectomy    . VENTRAL HERNIA REPAIR N/A 12/12/2012   Procedure: LAPAROSCOPIC VENTRAL HERNIA;  Surgeon: Madilyn Hook, DO;  Location: [facility];  Service: General;  Laterality: N/A;    Current Medications: Current Meds  Medication Sig  . acidophilus (RISAQUAD) CAPS capsule Take 1 capsule by mouth daily as needed (GI issues).   Marland Kitchen amLODipine (NORVASC) 10 MG tablet Take 5 mg by mouth daily at 12 noon.   Marland Kitchen atorvastatin (LIPITOR) 40 MG tablet Take 40 mg by mouth every evening.   Marland Kitchen B-D  UF III MINI PEN NEEDLES 31G X 5 MM MISC USE 3 PEN NEEDLES PER DAY  . colchicine 0.6 MG tablet Take 0.6 mg by mouth daily.   . Cranberry 250 MG CAPS Take 750 mg by mouth daily.   Marland Kitchen econazole nitrate 1 % cream Apply 1 application topically daily as needed (to groin rash).   Marland Kitchen esomeprazole (NEXIUM) 20 MG capsule Take 20 mg by mouth daily before breakfast.   . ferrous sulfate 325 (65 FE) MG tablet Take 325 mg by mouth daily with breakfast.  . fluticasone (FLONASE) 50 MCG/ACT  nasal spray Place 2 sprays into both nostrils daily as needed for rhinitis.   . furosemide (LASIX) 40 MG tablet Take two (2) tablets (80 mg) by mouth twice daily.  . hydrALAZINE (APRESOLINE) 25 MG tablet Take 3 tablets (75 mg total) by mouth 3 (three) times daily.  . insulin lispro (HUMALOG KWIKPEN) 100 UNIT/ML KiwkPen Inject 7 units before breakfast  . KLOR-CON 10 10 MEQ tablet TAKE 3 TABLETS BY MOUTH EVERY MORNING AND TAKE 3 TABLETS BY MOUTH EVERY EVENING  . metolazone (ZAROXOLYN) 5 MG tablet Take 5 mg by mouth daily as needed (for edema).   . metoprolol tartrate (LOPRESSOR) 50 MG tablet Take 1.5 tablets (75 mg total) by mouth 2 (two) times daily.  Glory Rosebush VERIO test strip USE 1 STRIP TO CHECK GLUCOSE TWICE DAILY AS DIRECTED  . polyethylene glycol (MIRALAX / GLYCOLAX) packet Take 17 g by mouth daily as needed for mild constipation.  Marland Kitchen VICTOZA 18 MG/3ML SOPN INJECT 1.2MG  SUBCUTANEOUSLY DAILY  . warfarin (COUMADIN) 5 MG tablet Take As Directed by [facility]  . [DISCONTINUED] amiodarone (PACERONE) 200 MG tablet Take 1 tablet (200 mg total) by mouth daily.  . [DISCONTINUED] metoprolol (LOPRESSOR) 50 MG tablet Take 50 mg by mouth 2 (two) times daily.     Allergies:   Lotensin [benazepril hcl]; Zantac [ranitidine hcl]; and Tape   Social History   Socioeconomic History  . Marital status: Married    Spouse name: Not on file  . Number of children: Not on file  . Years of education: Not on file  . Highest education level: Not on file  Occupational History  . Not on file  Social Needs  . Financial resource strain: Not on file  . Food insecurity:    Worry: Not on file    Inability: Not on file  . Transportation needs:    Medical: Not on file    Non-medical: Not on file  Tobacco Use  . Smoking status: Former Smoker    Types: Cigarettes  . Smokeless tobacco: Never Used  Substance and Sexual Activity  . Alcohol use: No  . Drug use: No  . Sexual activity: Never  Lifestyle  .  Physical activity:    Days per week: Not on file    Minutes per session: Not on file  . Stress: Not on file  Relationships  . Social connections:    Talks on phone: Not on file    Gets together: Not on file    Attends religious service: Not on file    Active member of club or organization: Not on file    Attends meetings of clubs or organizations: Not on file    Relationship status: Not on file  Other Topics Concern  . Not on file  Social History Narrative  . Not on file     Family History: The patient's family history includes Diabetes in her mother and  sister.  ROS:   Please see the history of present illness.    Her husband has dementia.  She has a lot of stress related to attempting to care for him.  All other systems reviewed and are negative.  EKGs/Labs/Other Studies Reviewed:    The following studies were reviewed today:  2D Doppler echocardiogram 12/11/2017: Study Conclusions   - Left ventricle: The cavity size was normal. There was moderate   concentric hypertrophy. Systolic function was normal. The   estimated ejection fraction was in the range of 55% to 60%. Wall   motion was normal; there were no regional wall motion   abnormalities. The study was not technically sufficient to allow   evaluation of LV diastolic dysfunction due to atrial   fibrillation. - Aortic valve: Trileaflet; mildly thickened, mildly calcified   leaflets. There was moderate regurgitation. - Mitral valve: Calcified annulus. Mildly thickened leaflets .   There was mild regurgitation. - Left atrium: The atrium was severely dilated. - Right ventricle: The cavity size was normal. Wall thickness was   normal. Systolic function was normal. - Right atrium: The atrium was severely dilated. - Tricuspid valve: There was moderate regurgitation. - Pulmonary arteries: Systolic pressure was moderately to severely   increased. PA peak pressure: 65 mm Hg (S).   EKG:  EKG is not ordered today.  ECG  from 11/26/2017 revealed coarse atrial f flutter with poor rate control at 96 bpm.   Recent Labs: 08/21/2017: Hemoglobin 12.0; Platelets 247 09/04/2017: BUN 40; Creatinine, Ser 1.83; Potassium 3.8; Sodium 140 11/26/2017: ALT 42; TSH 1.930  Recent Lipid Panel    Component Value Date/Time   CHOL 112 07/18/2016 1005   TRIG 111.0 07/18/2016 1005   HDL 53.70 07/18/2016 1005   CHOLHDL 2 07/18/2016 1005   VLDL 22.2 07/18/2016 1005   LDLCALC 36 07/18/2016 1005   LDLDIRECT 37.8 10/27/2013 0808    Physical Exam:    VS:  BP 120/66   Pulse 98   Ht 5\' 4"  (1.626 m)   Wt 177 lb 9.6 oz (80.6 kg)   BMI 30.48 kg/m     Wt Readings from Last 3 Encounters:  12/16/17 177 lb 9.6 oz (80.6 kg)  12/08/17 176 lb (79.8 kg)  11/26/17 188 lb (85.3 kg)     GEN: Elderly and frail well developed in no acute distress HEENT: Normal NECK: No JVD. LYMPHATICS: No lymphadenopathy CARDIAC: IIRR, no murmur, no gallop, 1-2+ left ankle edema. VASCULAR: 2+ bilateral pulses.  No bruits. RESPIRATORY:  Clear to auscultation without rales, wheezing or rhonchi  ABDOMEN: Soft, non-tender, non-distended, No pulsatile mass, MUSCULOSKELETAL: No deformity  SKIN: Warm and dry NEUROLOGIC:  Alert and oriented x 3 PSYCHIATRIC:  Normal affect   ASSESSMENT:    1. Persistent atrial fibrillation ([place])   2. Chronic kidney disease, stage IV (severe) ([place])   3. Chronic diastolic heart failure ([place])   4. On amiodarone therapy   5. Uncontrolled type 2 diabetes mellitus with hyperglycemia ([place])   6. Essential hypertension   7. Hyperlipidemia, unspecified hyperlipidemia type    PLAN:    In order of problems listed above:  1. Atrial flutter with variable AV conduction on amiodarone 200 mg/day.  At her current heart rate the patient feels she is back to her normal activity level.  We discussed the pros and cons of electrical cardioversion.  Given her long-standing history of atrial fib/atrial flutter and the recurrence of atrial  fibrillation on amiodarone therapy, have decided to switch  to a rate control strategy assuming that we can do this with metoprolol and possibly digoxin therapy.  Return in 2 weeks for assessment of heart rate off of amiodarone and for further up titration of metoprolol perhaps 100 mg twice daily. 2. Moderate to severe kidney disease not yet at dialysis level. 3. No evidence of volume overload 4. Discontinue amiodarone and increase metoprolol to 75 mg twice daily  We will attempt to control rate since she has variable AV conduction and atrial flutter on amiodarone.  Discontinue amnio increase metoprolol to 75 mg twice daily and anticipate increased to 100 mg twice daily on return and possibly the need to add low-dose diltiazem.  2-week follow-up.   Medication Adjustments/Labs and Tests Ordered: Current medicines are reviewed at length with the patient today.  Concerns regarding medicines are outlined above.  No orders of the defined types were placed in this encounter.  Meds ordered this encounter  Medications  . metoprolol tartrate (LOPRESSOR) 50 MG tablet    Sig: Take 1.5 tablets (75 mg total) by mouth 2 (two) times daily.    Dispense:  180 tablet    Refill:  1    Dose increase    Patient Instructions  Medication Instructions:  Your physician has recommended you make the following change in your medication:   1. STOP: amiodarone  2. INCREASE: metoprolol tartrate (lopressor) 50 mg tablet: Take 1.5 tablets (75 mg total) twice a day   Labwork: None orderd  Testing/Procedures: None ordered  Follow-Up: Your physician recommends that you schedule a follow-up appointment with Dr. Tamala Julian on 12/30/17 at 10:00 AM with an EKG   Any Other Special Instructions Will Be Listed Below (If Applicable).     If you need a refill on your cardiac medications before your next appointment, please call your pharmacy.     Signed, Sinclair Grooms, MD  12/16/2017 11:05 AM    [place]

## 2017-12-16 ENCOUNTER — Encounter: Payer: Self-pay | Admitting: Interventional Cardiology

## 2017-12-16 ENCOUNTER — Ambulatory Visit: Payer: Medicare Other | Admitting: Interventional Cardiology

## 2017-12-16 VITALS — BP 120/66 | HR 98 | Ht 64.0 in | Wt 177.6 lb

## 2017-12-16 DIAGNOSIS — I481 Persistent atrial fibrillation: Secondary | ICD-10-CM

## 2017-12-16 DIAGNOSIS — I5032 Chronic diastolic (congestive) heart failure: Secondary | ICD-10-CM

## 2017-12-16 DIAGNOSIS — E1165 Type 2 diabetes mellitus with hyperglycemia: Secondary | ICD-10-CM

## 2017-12-16 DIAGNOSIS — Z79899 Other long term (current) drug therapy: Secondary | ICD-10-CM

## 2017-12-16 DIAGNOSIS — I4819 Other persistent atrial fibrillation: Secondary | ICD-10-CM

## 2017-12-16 DIAGNOSIS — N184 Chronic kidney disease, stage 4 (severe): Secondary | ICD-10-CM

## 2017-12-16 DIAGNOSIS — I10 Essential (primary) hypertension: Secondary | ICD-10-CM

## 2017-12-16 DIAGNOSIS — E785 Hyperlipidemia, unspecified: Secondary | ICD-10-CM

## 2017-12-16 MED ORDER — METOPROLOL TARTRATE 50 MG PO TABS: 75.0000 mg | ORAL_TABLET | Freq: Two times a day (BID) | ORAL | 1 refills | Status: DC

## 2017-12-16 NOTE — Patient Instructions (Addendum)
Medication Instructions:  Your physician has recommended you make the following change in your medication:   1. STOP: amiodarone  2. INCREASE: metoprolol tartrate (lopressor) 50 mg tablet: Take 1.5 tablets (75 mg total) twice a day   Labwork: None orderd  Testing/Procedures: None ordered  Follow-Up: Your physician recommends that you schedule a follow-up appointment with Dr. Tamala Julian on 12/30/17 at 10:00 AM with an EKG   Any Other Special Instructions Will Be Listed Below (If Applicable).     If you need a refill on your cardiac medications before your next appointment, please call your pharmacy.

## 2017-12-21 DIAGNOSIS — E119 Type 2 diabetes mellitus without complications: Secondary | ICD-10-CM | POA: Diagnosis not present

## 2017-12-29 NOTE — Progress Notes (Signed)
Cardiology Office Note:    Date:  12/30/2017   ID:  [name], DOB 02-24-1934, MRN [id number]  PCP:  Aretta Nip, MD  Cardiologist:  Sinclair Grooms, MD   Referring MD: Aretta Nip, MD   Chief Complaint  Patient presents with  . Atrial Fibrillation  . Congestive Heart Failure     History of Present Illness:    [name] is a 84 y.o. female with a hx of Paroxysmal atrial fib, amiodarone therapy, chronic diastolic heart failure, elderly and frail.  Now with persistent atrial fibrillation for consideration of electrical cardioversion vs rate control.  She feels better.  No difficulty since stopping amiodarone and increasing metoprolol.  She denies orthopnea.  She has more endurance with activities around the house.  She has not had any of her medication yet this morning.   Past Medical History:  Diagnosis Date  . Anemia due to blood loss, chronic 08/23/2013  . Atrial fibrillation ([place]) 12/09/2012  . Candidal skin infection 12/23/2012  . Chronic diastolic heart failure ([place]) 08/23/2013  . Chronic kidney disease, stage IV (severe) ([place]) 12/09/2012  . Diabetes mellitus with renal complications ([place]) 53/11/7671  . Diabetes mellitus without complication ([place])   . Dyslipidemia 12/09/2012  . Fall at home Sept. 3, 2016   Fx  3 ribs  . GERD (gastroesophageal reflux disease) 12/23/2012  . HTN (hypertension) 12/09/2012  . Hyperlipidemia 12/23/2012  . Hypertension   . Ileus, postoperative ([place]) 12/17/2012  . Intractable pain 12/03/2014  . Irregular heart beat   . Long term current use of anticoagulant therapy 02/22/2013  . On amiodarone therapy 02/22/2013  . Peptic esophagitis 08/23/2013  . Pressure injury of skin 05/21/2016  . Renal disorder   . Type II diabetes mellitus, uncontrolled ([place]) 09/06/2013  . Umbilical hernia, incarcerated - reducible 12/10/2012    Past Surgical History:  Procedure Laterality Date  . BREAST LUMPECTOMY Right 2001   2 lumpectomies with  radiation both 2001  . CATARACT EXTRACTION, BILATERAL    . HERNIA REPAIR    . I&D EXTREMITY Right 05/19/2016   Procedure: MINOR IRRIGATION AND DEBRIDEMENT Right great toe;  Surgeon: Gaynelle Arabian, MD;  [facility];  Service: Orthopedics;  Laterality: Right;  . INSERTION OF MESH N/A 12/12/2012   Procedure: INSERTION OF MESH;  Surgeon: Madilyn Hook, DO;  Location: [facility];  Service: General;  Laterality: N/A;  . LAPAROSCOPIC LYSIS OF ADHESIONS N/A 12/12/2012   Procedure: LAPAROSCOPIC LYSIS OF ADHESIONS;  Surgeon: Madilyn Hook, DO;  [facility];  Service: General;  Laterality: N/A;  . Right Lumpectomy    . VENTRAL HERNIA REPAIR N/A 12/12/2012   Procedure: LAPAROSCOPIC VENTRAL HERNIA;  Surgeon: Madilyn Hook, DO;  [facility];  Service: General;  Laterality: N/A;    Current Medications: Current Meds  Medication Sig  . acidophilus (RISAQUAD) CAPS capsule Take 1 capsule by mouth daily as needed (GI issues).   Marland Kitchen amLODipine (NORVASC) 10 MG tablet Take 5 mg by mouth daily at 12 noon.   Marland Kitchen atorvastatin (LIPITOR) 40 MG tablet Take 40 mg by mouth every evening.   . B-D UF III MINI PEN NEEDLES 31G X 5 MM MISC USE 3 PEN NEEDLES PER DAY  . colchicine 0.6 MG tablet Take 0.6 mg by mouth daily.   . Cranberry 250 MG CAPS Take 750 mg by mouth daily.   Marland Kitchen econazole nitrate 1 % cream Apply 1 application topically daily as needed (to groin rash).   Marland Kitchen  esomeprazole (NEXIUM) 20 MG capsule Take 20 mg by mouth daily before breakfast.   . ferrous sulfate 325 (65 FE) MG tablet Take 325 mg by mouth daily with breakfast.  . fluticasone (FLONASE) 50 MCG/ACT nasal spray Place 2 sprays into both nostrils daily as needed for rhinitis.   . furosemide (LASIX) 40 MG tablet Take two (2) tablets (80 mg) by mouth twice daily.  . hydrALAZINE (APRESOLINE) 25 MG tablet Take 3 tablets (75 mg total) by mouth 3 (three) times daily.  . insulin lispro (HUMALOG KWIKPEN) 100 UNIT/ML KiwkPen Inject 7 units before breakfast  .  KLOR-CON 10 10 MEQ tablet TAKE 3 TABLETS BY MOUTH EVERY MORNING AND TAKE 3 TABLETS BY MOUTH EVERY EVENING  . metolazone (ZAROXOLYN) 5 MG tablet Take 5 mg by mouth daily as needed (for edema).   . metoprolol tartrate (LOPRESSOR) 50 MG tablet Take 1.5 tablets (75 mg total) by mouth 2 (two) times daily.  Glory Rosebush VERIO test strip USE 1 STRIP TO CHECK GLUCOSE TWICE DAILY AS DIRECTED  . polyethylene glycol (MIRALAX / GLYCOLAX) packet Take 17 g by mouth daily as needed for mild constipation.  Marland Kitchen VICTOZA 18 MG/3ML SOPN INJECT 1.2MG  SUBCUTANEOUSLY DAILY  . warfarin (COUMADIN) 5 MG tablet Take As Directed by [facility]     Allergies:   Lotensin [benazepril hcl]; Zantac [ranitidine hcl]; and Tape   Social History   Socioeconomic History  . Marital status: Married    Spouse name: Not on file  . Number of children: Not on file  . Years of education: Not on file  . Highest education level: Not on file  Occupational History  . Not on file  Social Needs  . Financial resource strain: Not on file  . Food insecurity:    Worry: Not on file    Inability: Not on file  . Transportation needs:    Medical: Not on file    Non-medical: Not on file  Tobacco Use  . Smoking status: Former Smoker    Types: Cigarettes  . Smokeless tobacco: Never Used  Substance and Sexual Activity  . Alcohol use: No  . Drug use: No  . Sexual activity: Never  Lifestyle  . Physical activity:    Days per week: Not on file    Minutes per session: Not on file  . Stress: Not on file  Relationships  . Social connections:    Talks on phone: Not on file    Gets together: Not on file    Attends religious service: Not on file    Active member of club or organization: Not on file    Attends meetings of clubs or organizations: Not on file    Relationship status: Not on file  Other Topics Concern  . Not on file  Social History Narrative  . Not on file     Family History: The patient's family history includes  Diabetes in her mother and sister.  ROS:   Please see the history of present illness.    Positive cough.,  Muscle pain, snoring.  All other systems reviewed and are negative.  EKGs/Labs/Other Studies Reviewed:    The following studies were reviewed today: No new data  EKG:  EKG is  ordered today.  The ekg ordered today demonstrates atrial flutter with variable ventricular response at a rate of 75 bpm.  Recent Labs: 08/21/2017: Hemoglobin 12.0; Platelets 247 09/04/2017: BUN 40; Creatinine, Ser 1.83; Potassium 3.8; Sodium 140 11/26/2017: ALT 42; TSH 1.930  Recent  Lipid Panel    Component Value Date/Time   CHOL 112 07/18/2016 1005   TRIG 111.0 07/18/2016 1005   HDL 53.70 07/18/2016 1005   CHOLHDL 2 07/18/2016 1005   VLDL 22.2 07/18/2016 1005   LDLCALC 36 07/18/2016 1005   LDLDIRECT 37.8 10/27/2013 0808    Physical Exam:    VS:  BP (!) 154/76   Pulse 75   Ht 5\' 4"  (1.626 m)   Wt 181 lb 12.8 oz (82.5 kg)   BMI 31.21 kg/m     Wt Readings from Last 3 Encounters:  12/30/17 181 lb 12.8 oz (82.5 kg)  12/16/17 177 lb 9.6 oz (80.6 kg)  12/08/17 176 lb (79.8 kg)     GEN: Obese, elderly, and frail.  Well developed in no acute distress HEENT: Normal NECK: No JVD. LYMPHATICS: No lymphadenopathy CARDIAC: IIRR, no murmur, no gallop, trace bilateral edema. VASCULAR: 2+ bilateral radial pulses.  No bruits. RESPIRATORY:  Clear to auscultation without rales, wheezing or rhonchi  ABDOMEN: Soft, non-tender, non-distended, No pulsatile mass, MUSCULOSKELETAL: No deformity  SKIN: Warm and dry NEUROLOGIC:  Alert and oriented x 3 PSYCHIATRIC:  Normal affect   ASSESSMENT:    1. Persistent atrial fibrillation   2. Chronic diastolic heart failure ([place])   3. On amiodarone therapy   4. Essential hypertension   5. Medication management   6. Long term current use of anticoagulant therapy    PLAN:    In order of problems listed above:  1. Rate control is better after increasing  metoprolol to 75 mg twice daily and despite discontinuation of amiodarone.  She has had no medications yet this morning.  We could further uptitrate beta-blocker therapy if needed.  Return in 3 to 4 weeks for reassessment of heart rate after amnio has completely washed out.  Will not plan to perform cardioversion.  Continue chronic anticoagulation therapy.  She should notify us if shortness of breath, fatigue, or palpitations.   Medication Adjustments/Labs and Tests Ordered: Current medicines are reviewed at length with the patient today.  Concerns regarding medicines are outlined above.  No orders of the defined types were placed in this encounter.  No orders of the defined types were placed in this encounter.   There are no Patient Instructions on file for this visit.   Signed, Sinclair Grooms, MD  12/30/2017 9:36 AM    [place]

## 2017-12-30 ENCOUNTER — Ambulatory Visit (INDEPENDENT_AMBULATORY_CARE_PROVIDER_SITE_OTHER): Payer: Medicare Other | Admitting: *Deleted

## 2017-12-30 ENCOUNTER — Ambulatory Visit: Payer: Medicare Other | Admitting: Interventional Cardiology

## 2017-12-30 ENCOUNTER — Encounter: Payer: Self-pay | Admitting: Interventional Cardiology

## 2017-12-30 VITALS — BP 154/76 | HR 75 | Ht 64.0 in | Wt 181.8 lb

## 2017-12-30 DIAGNOSIS — I5032 Chronic diastolic (congestive) heart failure: Secondary | ICD-10-CM

## 2017-12-30 DIAGNOSIS — Z7901 Long term (current) use of anticoagulants: Secondary | ICD-10-CM

## 2017-12-30 DIAGNOSIS — Z79899 Other long term (current) drug therapy: Secondary | ICD-10-CM | POA: Diagnosis not present

## 2017-12-30 DIAGNOSIS — I4891 Unspecified atrial fibrillation: Secondary | ICD-10-CM

## 2017-12-30 DIAGNOSIS — I10 Essential (primary) hypertension: Secondary | ICD-10-CM

## 2017-12-30 DIAGNOSIS — Z5181 Encounter for therapeutic drug level monitoring: Secondary | ICD-10-CM

## 2017-12-30 DIAGNOSIS — I4819 Other persistent atrial fibrillation: Secondary | ICD-10-CM | POA: Diagnosis not present

## 2017-12-30 LAB — POCT INR: INR: 2.5 (ref 2.0–3.0)

## 2017-12-30 NOTE — Patient Instructions (Signed)
Medication Instructions:  Your physician recommends that you continue on your current medications as directed. Please refer to the Current Medication list given to you today.  If you need a refill on your cardiac medications before your next appointment, please call your pharmacy.   Lab work: none If you have labs (blood work) drawn today and your tests are completely normal, you will receive your results only by: Marland Kitchen MyChart Message (if you have MyChart) OR . A paper copy in the mail If you have any lab test that is abnormal or we need to change your treatment, we will call you to review the results.  Testing/Procedures: none  Follow-Up: At [facility], you and your health needs are our priority.  As part of our continuing mission to provide you with exceptional heart care, we have created designated Provider Care Teams.  These Care Teams include your primary Cardiologist (physician) and Advanced Practice Providers (APPs -  Physician Assistants and Nurse Practitioners) who all work together to provide you with the care you need, when you need it. You will need a follow up appointment in 1 months.  Please call our office 2 months in advance to schedule this appointment.  You may see Sinclair Grooms, MD or one of the following Advanced Practice Providers on your designated Care Team:   Truitt Merle, NP Cecilie Kicks, NP . Kathyrn Drown, NP  Any Other Special Instructions Will Be Listed Below (If Applicable). One month follow up with care team with EKG.

## 2017-12-30 NOTE — Patient Instructions (Signed)
Description   Continue on same dosage 1 tablet daily except 1.5 tablets on Wednesdays and Fridays. Recheck INR in 3 weeks. Amio d/c on 12/16/17. [facility] [phone]

## 2018-01-11 ENCOUNTER — Telehealth: Payer: Self-pay | Admitting: Interventional Cardiology

## 2018-01-11 MED ORDER — HYDRALAZINE HCL 25 MG PO TABS: 75.0000 mg | ORAL_TABLET | Freq: Three times a day (TID) | ORAL | 0 refills | Status: DC

## 2018-01-11 NOTE — Telephone Encounter (Signed)
New Message:    Pt wants to know if you have received prior authorization for her Hydralazine.

## 2018-01-11 NOTE — Telephone Encounter (Signed)
**Note De-Identified Narciso Stoutenburg Obfuscation** I called the pts pharmacy and was advised that the pt only needs a refill on her Hydralazine and not a PA. The pt is aware. I sent a 30 day supply with 1 refill to [facility] per the pts request.

## 2018-01-20 ENCOUNTER — Other Ambulatory Visit: Payer: Self-pay | Admitting: Endocrinology

## 2018-01-22 ENCOUNTER — Ambulatory Visit (INDEPENDENT_AMBULATORY_CARE_PROVIDER_SITE_OTHER): Payer: Medicare Other | Admitting: Pharmacist

## 2018-01-22 ENCOUNTER — Ambulatory Visit (INDEPENDENT_AMBULATORY_CARE_PROVIDER_SITE_OTHER): Payer: Medicare Other | Admitting: Physician Assistant

## 2018-01-22 ENCOUNTER — Encounter: Payer: Self-pay | Admitting: Physician Assistant

## 2018-01-22 VITALS — BP 120/60 | HR 90 | Ht 64.0 in | Wt 176.8 lb

## 2018-01-22 DIAGNOSIS — I5032 Chronic diastolic (congestive) heart failure: Secondary | ICD-10-CM

## 2018-01-22 DIAGNOSIS — I4891 Unspecified atrial fibrillation: Secondary | ICD-10-CM | POA: Diagnosis not present

## 2018-01-22 DIAGNOSIS — Z5181 Encounter for therapeutic drug level monitoring: Secondary | ICD-10-CM

## 2018-01-22 DIAGNOSIS — I10 Essential (primary) hypertension: Secondary | ICD-10-CM

## 2018-01-22 DIAGNOSIS — I4819 Other persistent atrial fibrillation: Secondary | ICD-10-CM

## 2018-01-22 LAB — POCT INR: INR: 2.6 (ref 2.0–3.0)

## 2018-01-22 NOTE — Patient Instructions (Signed)
Medication Instructions:  1. Your physician recommends that you continue on your current medications as directed. Please refer to the Current Medication list given to you today.  If you need a refill on your cardiac medications before your next appointment, please call your pharmacy.   Lab work: NONE ORDERED TODAY If you have labs (blood work) drawn today and your tests are completely normal, you will receive your results only by: Marland Kitchen MyChart Message (if you have MyChart) OR . A paper copy in the mail If you have any lab test that is abnormal or we need to change your treatment, we will call you to review the results.  Testing/Procedures: NONE ORDERED  Follow-Up: At [facility], you and your health needs are our priority.  As part of our continuing mission to provide you with exceptional heart care, we have created designated Provider Care Teams.  These Care Teams include your primary Cardiologist (physician) and Advanced Practice Providers (APPs -  Physician Assistants and Nurse Practitioners) who all work together to provide you with the care you need, when you need it. You will need a follow up appointment in 3 months.  Please call our office 2 months in advance to schedule this appointment.  You may see Sinclair Grooms, MD or one of the following Advanced Practice Providers on your designated Care Team:   Truitt Merle, NP Cecilie Kicks, NP . Kathyrn Drown, NP  Any Other Special Instructions Will Be Listed Below (If Applicable).

## 2018-01-22 NOTE — Patient Instructions (Signed)
Description   Continue on same dosage 1 tablet daily except 1.5 tablets on Wednesdays and Fridays. Recheck INR in 4 weeks. Amio d/c on 12/16/17. [facility] [phone]

## 2018-01-22 NOTE — Progress Notes (Signed)
Cardiology Office Note:    Date:  01/22/2018   ID:  EMYLIA Costa, DOB Apr 10, 1933, MRN [id number]  PCP:  Aretta Nip, MD  Cardiologist:  Sinclair Grooms, MD   Electrophysiologist:  None   Referring MD: Aretta Nip, MD   Chief Complaint  Patient presents with  . Follow-up    Atrial fibrillation     History of Present Illness:    [name] is a 84 y.o. female with persistent atrial fibrillation, diastolic heart failure, chronic kidney disease, diabetes, hypertension, hyperlipidemia.  She was recently taken off of amiodarone due to return of atrial fibrillation.  She was last seen by Dr. Tamala Julian in October 2019.  A strategy of rate control has been chosen.     [name] returns for follow-up on atrial fibrillation.  She is here with her daughter.  Overall, she is feeling better.  She feels that she can do more activities without getting short of breath.  She sleeps on an incline.  She denies PND.  Lower extremity swelling is stable.  Her daughter thinks that her left leg looks much better.  She denies syncope.  She denies any bleeding issues.  Prior CV studies:   The following studies were reviewed today:  Echocardiogram 12/11/2017 Moderate concentric LVH, EF 55-60, normal wall motion, moderate AI, MAC, mild MR, severe LAE, normal RVSF, severe RAE, moderate TR, PASP 65  Past Medical History:  Diagnosis Date  . Anemia due to blood loss, chronic 08/23/2013  . Atrial fibrillation ([place]) 12/09/2012  . Candidal skin infection 12/23/2012  . Chronic diastolic heart failure ([place]) 08/23/2013  . Chronic kidney disease, stage IV (severe) ([place]) 12/09/2012  . Diabetes mellitus with renal complications ([place]) 69/08/7891  . Diabetes mellitus without complication ([place])   . Dyslipidemia 12/09/2012  . Fall at home Sept. 3, 2016   Fx  3 ribs  . GERD (gastroesophageal reflux disease) 12/23/2012  . HTN (hypertension) 12/09/2012  . Hyperlipidemia 12/23/2012  . Hypertension   . Ileus,  postoperative ([place]) 12/17/2012  . Intractable pain 12/03/2014  . Irregular heart beat   . Long term current use of anticoagulant therapy 02/22/2013  . On amiodarone therapy 02/22/2013  . Peptic esophagitis 08/23/2013  . Pressure injury of skin 05/21/2016  . Renal disorder   . Type II diabetes mellitus, uncontrolled ([place]) 09/06/2013  . Umbilical hernia, incarcerated - reducible 12/10/2012   Surgical Hx: The patient  has a past surgical history that includes Hernia repair; Right Lumpectomy; Cataract extraction, bilateral; Ventral hernia repair (N/A, 12/12/2012); Insertion of mesh (N/A, 12/12/2012); Laparoscopic lysis of adhesions (N/A, 12/12/2012); I&D extremity (Right, 05/19/2016); and Breast lumpectomy (Right, 2001).   Current Medications: Current Meds  Medication Sig  . acidophilus (RISAQUAD) CAPS capsule Take 1 capsule by mouth daily as needed (GI issues).   Marland Kitchen amLODipine (NORVASC) 10 MG tablet Take 5 mg by mouth daily at 12 noon.   Marland Kitchen atorvastatin (LIPITOR) 40 MG tablet Take 40 mg by mouth every evening.   . B-D UF III MINI PEN NEEDLES 31G X 5 MM MISC USE 3 PEN NEEDLES PER DAY  . colchicine 0.6 MG tablet Take 0.6 mg by mouth daily.   . Cranberry 250 MG CAPS Take 750 mg by mouth daily.   Marland Kitchen econazole nitrate 1 % cream Apply 1 application topically daily as needed (to groin rash).   Marland Kitchen esomeprazole (NEXIUM) 20 MG capsule Take 20 mg by mouth daily before breakfast.   . ferrous sulfate  325 (65 FE) MG tablet Take 325 mg by mouth daily with breakfast.  . fluticasone (FLONASE) 50 MCG/ACT nasal spray Place 2 sprays into both nostrils daily as needed for rhinitis.   . hydrALAZINE (APRESOLINE) 25 MG tablet Take 3 tablets (75 mg total) by mouth 3 (three) times daily.  . insulin lispro (HUMALOG KWIKPEN) 100 UNIT/ML KiwkPen Inject 7 units before breakfast  . KLOR-CON 10 10 MEQ tablet TAKE 3 TABLETS BY MOUTH EVERY MORNING AND TAKE 3 TABLETS BY MOUTH EVERY EVENING  . metolazone (ZAROXOLYN) 5 MG tablet Take 5 mg by  mouth daily as needed (for edema).   . metoprolol tartrate (LOPRESSOR) 50 MG tablet Take 1.5 tablets (75 mg total) by mouth 2 (two) times daily.  Glory Rosebush VERIO test strip USE 1 STRIP TO CHECK GLUCOSE TWICE DAILY AS DIRECTED  . polyethylene glycol (MIRALAX / GLYCOLAX) packet Take 17 g by mouth daily as needed for mild constipation.  Marland Kitchen VICTOZA 18 MG/3ML SOPN INJECT 1.2MG SUBCUTANEOUSLY DAILY  . warfarin (COUMADIN) 5 MG tablet Take As Directed by Coumadin Clinic  . [DISCONTINUED] furosemide (LASIX) 40 MG tablet Take two (2) tablets (80 mg) by mouth twice daily.     Allergies:   Lotensin [benazepril hcl]; Zantac [ranitidine hcl]; and Tape   Social History   Tobacco Use  . Smoking status: Former Smoker    Types: Cigarettes  . Smokeless tobacco: Never Used  Substance Use Topics  . Alcohol use: No  . Drug use: No     Family Hx: The patient's family history includes Diabetes in her mother and sister.  ROS:   Please see the history of present illness.    Review of Systems  Respiratory: Positive for snoring.    All other systems reviewed and are negative.   EKGs/Labs/Other Test Reviewed:    EKG:  EKG is  ordered today.  The ekg ordered today demonstrates atrial flutter, heart rate 80, similar to prior tracing  Recent Labs: 08/21/2017: Hemoglobin 12.0; Platelets 247 09/04/2017: BUN 40; Creatinine, Ser 1.83; Potassium 3.8; Sodium 140 11/26/2017: ALT 42; TSH 1.930   Recent Lipid Panel Lab Results  Component Value Date/Time   CHOL 112 07/18/2016 10:05 AM   TRIG 111.0 07/18/2016 10:05 AM   HDL 53.70 07/18/2016 10:05 AM   CHOLHDL 2 07/18/2016 10:05 AM   LDLCALC 36 07/18/2016 10:05 AM   LDLDIRECT 37.8 10/27/2013 08:08 AM    Physical Exam:    VS:  BP 120/60   Pulse 90   Ht _0  (1.626 m)   Wt 176 lb 12.8 oz (80.2 kg)   SpO2 98%   BMI 30.35 kg/m     Wt Readings from Last 3 Encounters:  01/22/18 176 lb 12.8 oz (80.2 kg)  12/30/17 181 lb 12.8 oz (82.5 kg)  12/16/17 177 lb  9.6 oz (80.6 kg)     Physical Exam  Constitutional: She is oriented to person, place, and time. She appears well-developed and well-nourished. No distress.  HENT:  Head: Normocephalic and atraumatic.  Eyes: No scleral icterus.  Neck: No thyromegaly present.  Cardiovascular: Normal rate. An irregularly irregular rhythm present.  No murmur heard. Pulmonary/Chest: Effort normal. She has no rales.  Abdominal: Soft.  Musculoskeletal: She exhibits edema (trace-1+ bilat LE edema L>R).  Neurological: She is alert and oriented to person, place, and time.  Skin: Skin is warm and dry.    ASSESSMENT & PLAN:    Persistent atrial fibrillation/flutter Rate is controlled.  Continue anticoagulation with warfarin.  Follow-up again in 2 to 3 months to reassess rate control once amiodarone is completely washed out.  Chronic diastolic heart failure (HCC) Volume status remains stable.  Continue current therapy.  Essential hypertension   The patient's blood pressure is controlled on her current regimen.  Continue current therapy.    Dispo:  Return in about 3 months (around 04/24/2018) for Routine Follow Up w/ Dr. Tamala Julian, or PA/NP.   Medication Adjustments/Labs and Tests Ordered: Current medicines are reviewed at length with the patient today.  Concerns regarding medicines are outlined above.  Tests Ordered: Orders Placed This Encounter  Procedures  . EKG 12-Lead   Medication Changes: No orders of the defined types were placed in this encounter.   Signed, Richardson Dopp, PA-C  01/22/2018 1:11 PM    [facility] [address], [place], [place]  [address] Phone: [phone]; Fax: [phone]

## 2018-01-26 DIAGNOSIS — E1022 Type 1 diabetes mellitus with diabetic chronic kidney disease: Secondary | ICD-10-CM | POA: Diagnosis not present

## 2018-02-09 ENCOUNTER — Other Ambulatory Visit: Payer: Self-pay | Admitting: Interventional Cardiology

## 2018-02-23 ENCOUNTER — Ambulatory Visit: Payer: Medicare Other | Admitting: *Deleted

## 2018-02-23 ENCOUNTER — Encounter (INDEPENDENT_AMBULATORY_CARE_PROVIDER_SITE_OTHER): Payer: Self-pay

## 2018-02-23 DIAGNOSIS — Z5181 Encounter for therapeutic drug level monitoring: Secondary | ICD-10-CM | POA: Diagnosis not present

## 2018-02-23 DIAGNOSIS — I4891 Unspecified atrial fibrillation: Secondary | ICD-10-CM

## 2018-02-23 LAB — POCT INR: INR: 2.8 (ref 2.0–3.0)

## 2018-02-23 NOTE — Patient Instructions (Signed)
Description   Continue on same dosage 1 tablet daily except 1.5 tablets on Wednesdays and Fridays. Recheck INR in 4 weeks. Amio d/c on 12/16/17. [facility] [phone]

## 2018-02-26 ENCOUNTER — Ambulatory Visit: Payer: Medicare Other | Admitting: Podiatry

## 2018-03-08 ENCOUNTER — Other Ambulatory Visit: Payer: Self-pay | Admitting: Interventional Cardiology

## 2018-03-09 ENCOUNTER — Encounter: Payer: Self-pay | Admitting: Endocrinology

## 2018-03-09 ENCOUNTER — Ambulatory Visit (INDEPENDENT_AMBULATORY_CARE_PROVIDER_SITE_OTHER): Payer: Medicare Other | Admitting: Endocrinology

## 2018-03-09 VITALS — BP 128/70 | HR 92 | Ht 64.0 in | Wt 178.8 lb

## 2018-03-09 DIAGNOSIS — E1165 Type 2 diabetes mellitus with hyperglycemia: Secondary | ICD-10-CM | POA: Diagnosis not present

## 2018-03-09 DIAGNOSIS — Z794 Long term (current) use of insulin: Secondary | ICD-10-CM | POA: Diagnosis not present

## 2018-03-09 LAB — GLUCOSE, POCT (MANUAL RESULT ENTRY): POC GLUCOSE: 152 mg/dL — AB (ref 70–99)

## 2018-03-09 LAB — POCT GLYCOSYLATED HEMOGLOBIN (HGB A1C): Hemoglobin A1C: 6.4 % — AB (ref 4.0–5.6)

## 2018-03-09 NOTE — Progress Notes (Signed)
Patient ID: [name], female   DOB: [birth year], 84 y.o.   MRN: [id number]    Reason for Appointment: Follow-up for Type 2 Diabetes  Referring physician: Jordan Hawks Rankins  History of Present Illness:          Diagnosis: Type 2 diabetes mellitus, date of diagnosis: ? 59 years ago        Past history: She was initially treated with Metformin but this was done because it caused GI side effects  May also tried Actos at some point, probably started because of the fear of side effects but did not know if it helped Actos stopped ? Reason Over the last few years she has been taking Precose with meals and also Januvia in increasing doses Apparently her blood sugars have been higher since 9/14 when she had abdominal surgery and before that they were as low as 70. On her initial consultation in 6/15 because of her renal dysfunction her medication regimen was changed. Previously was taking Januvia, Precose and glipizide without adequate control; her blood sugars were occasionally over 200 fasting and A1c was 9.4 She was started on Victoza 0.6 mg and Lantus insulin 8 units a day but the Lantus had to be stopped because of relatively low fasting glucose.  Since she had tried Prandin and glipizide and her postprandial readings were as high as 330 she was started on Humalog at mealtimes in 8/15  Recent history:    She is taking Victoza at bedtime as her only treatment, not on insulin  Her A1c is still about the same at 6.4 although it has been as low as 5.8 previously   Current blood sugar patterns, current management  and problems identified:  She did bring her monitor, she did not have it on the last visit  She now appears to have consistently high readings at bedtime which is when she is checking her sugars more often  This is about 3 to 5 hours after her evening meal  She now says that she is drinking sweet tea at dinnertime and stopped having ginger ale in the evening  But  sugars do not appear to be high the rest of the day although checking infrequently  She says she sometimes takes her Victoza in the morning and sometimes midday  Also had been concerned about the cost out of pocket  Fasting readings are near normal  Today glucose is 152 after breakfast in the office  She has a relatively large breakfast, sometimes high in fat   Usually eating a 1/2 sandwich at lunch, sometimes less       Oral hypoglycemic drugs the patient is taking are: none  Side effects from medications have been: None   Glucose monitoring:  done once  a day        Glucometer:  One Touch Verio Blood sugars from download:   PRE-MEAL Fasting Lunch Dinner Bedtime Overall  Glucose range:  99, 105   143, 207    Mean/median:    220 196   POST-MEAL PC Breakfast PC Lunch PC Dinner  Glucose range:   ?  Mean/median:          Self-care:   Meals: 3 meals per day.   Breakfast: eggs, grits,meat;  sometimes 1/2 Boost; will have spaghetti or rice at supper. Dinner 6 pm           Exercise:  none, unable to          Dietician visit: Most  recent: Unknown.               Weight history:  Wt Readings from Last 3 Encounters:  03/09/18 178 lb 12.8 oz (81.1 kg)  01/22/18 176 lb 12.8 oz (80.2 kg)  12/30/17 181 lb 12.8 oz (82.5 kg)   Glycemic control:  Lab Results  Component Value Date   HGBA1C 6.5 (A) 12/08/2017   HGBA1C 5.8 06/18/2017   HGBA1C 6.1 03/20/2017   Lab Results  Component Value Date   MICROALBUR 132.3 (H) 06/18/2017   LDLCALC 36 07/18/2016   CREATININE 1.83 (H) 09/04/2017     Allergies as of 03/09/2018      Reactions   Lotensin [benazepril Hcl] Anaphylaxis   Zantac [ranitidine Hcl] Anaphylaxis   Tape Itching, Rash      Medication List       Accurate as of March 09, 2018  9:40 AM. Always use your most recent med list.        acidophilus Caps capsule Take 1 capsule by mouth daily as needed (GI issues).   amLODipine 10 MG tablet Commonly known as:   NORVASC Take 5 mg by mouth daily at 12 noon.   atorvastatin 40 MG tablet Commonly known as:  LIPITOR Take 40 mg by mouth every evening.   B-D UF III MINI PEN NEEDLES 31G X 5 MM Misc Generic drug:  Insulin Pen Needle USE 3 PEN NEEDLES PER DAY   colchicine 0.6 MG tablet Take 0.6 mg by mouth daily.   Cranberry 250 MG Caps Take 750 mg by mouth daily.   econazole nitrate 1 % cream Apply 1 application topically daily as needed (to groin rash).   esomeprazole 20 MG capsule Commonly known as:  NEXIUM Take 20 mg by mouth daily before breakfast.   ferrous sulfate 325 (65 FE) MG tablet Take 325 mg by mouth daily with breakfast.   fluticasone 50 MCG/ACT nasal spray Commonly known as:  FLONASE Place 2 sprays into both nostrils daily as needed for rhinitis.   furosemide 80 MG tablet Commonly known as:  LASIX TAKE 1 TABLET BY MOUTH EVERY MORNING AND 1/2 TABLET EVERY EVENING   hydrALAZINE 25 MG tablet Commonly known as:  APRESOLINE Take 3 tablets (75 mg total) by mouth 3 (three) times daily.   insulin lispro 100 UNIT/ML KiwkPen Commonly known as:  HUMALOG KWIKPEN Inject 7 units before breakfast   metolazone 5 MG tablet Commonly known as:  ZAROXOLYN Take 5 mg by mouth daily as needed (for edema).   metoprolol tartrate 50 MG tablet Commonly known as:  LOPRESSOR Take 1.5 tablets (75 mg total) by mouth 2 (two) times daily.   ONETOUCH VERIO test strip Generic drug:  glucose blood USE 1 STRIP TO CHECK GLUCOSE TWICE DAILY AS DIRECTED   polyethylene glycol packet Commonly known as:  MIRALAX / GLYCOLAX Take 17 g by mouth daily as needed for mild constipation.   potassium chloride 10 MEQ tablet Commonly known as:  K-DUR TAKE 3 TABLETS BY MOUTH EVERY MORNING AND TAKE 3 TABLETS BY MOUTH EVERY EVENING   VICTOZA 18 MG/3ML Sopn Generic drug:  liraglutide INJECT 1.2MG  SUBCUTANEOUSLY DAILY   warfarin 5 MG tablet Commonly known as:  COUMADIN Take as directed by the anticoagulation  clinic. If you are unsure how to take this medication, talk to your nurse or doctor. Original instructions:  Take As Directed by Coumadin Clinic       Allergies:  Allergies  Allergen Reactions  . Lotensin [Benazepril Hcl] Anaphylaxis  .  Zantac [Ranitidine Hcl] Anaphylaxis  . Tape Itching and Rash    Past Medical History:  Diagnosis Date  . Anemia due to blood loss, chronic 08/23/2013  . Atrial fibrillation ([place]) 12/09/2012  . Candidal skin infection 12/23/2012  . Chronic diastolic heart failure ([place]) 08/23/2013  . Chronic kidney disease, stage IV (severe) ([place]) 12/09/2012  . Diabetes mellitus with renal complications ([place]) 58/07/2776  . Diabetes mellitus without complication ([place])   . Dyslipidemia 12/09/2012  . Fall at home Sept. 3, 2016   Fx  3 ribs  . GERD (gastroesophageal reflux disease) 12/23/2012  . HTN (hypertension) 12/09/2012  . Hyperlipidemia 12/23/2012  . Hypertension   . Ileus, postoperative ([place]) 12/17/2012  . Intractable pain 12/03/2014  . Irregular heart beat   . Long term current use of anticoagulant therapy 02/22/2013  . On amiodarone therapy 02/22/2013  . Peptic esophagitis 08/23/2013  . Pressure injury of skin 05/21/2016  . Renal disorder   . Type II diabetes mellitus, uncontrolled ([place]) 09/06/2013  . Umbilical hernia, incarcerated - reducible 12/10/2012    Past Surgical History:  Procedure Laterality Date  . BREAST LUMPECTOMY Right 2001   2 lumpectomies with radiation both 2001  . CATARACT EXTRACTION, BILATERAL    . HERNIA REPAIR    . I&D EXTREMITY Right 05/19/2016   Procedure: MINOR IRRIGATION AND DEBRIDEMENT Right great toe;  Surgeon: Gaynelle Arabian, MD;  Location: [facility];  Service: Orthopedics;  Laterality: Right;  . INSERTION OF MESH N/A 12/12/2012   Procedure: INSERTION OF MESH;  Surgeon: Madilyn Hook, DO;  Location: [facility];  Service: General;  Laterality: N/A;  . LAPAROSCOPIC LYSIS OF ADHESIONS N/A 12/12/2012   Procedure: LAPAROSCOPIC LYSIS OF ADHESIONS;   Surgeon: Madilyn Hook, DO;  Location: [facility];  Service: General;  Laterality: N/A;  . Right Lumpectomy    . VENTRAL HERNIA REPAIR N/A 12/12/2012   Procedure: LAPAROSCOPIC VENTRAL HERNIA;  Surgeon: Madilyn Hook, DO;  Location: [facility];  Service: General;  Laterality: N/A;    Family History  Problem Relation Age of Onset  . Diabetes Mother   . Diabetes Sister     Social History:  reports that she has quit smoking. Her smoking use included cigarettes. She has never used smokeless tobacco. She reports that she does not drink alcohol or use drugs.    Review of Systems        Lipids: She has been on 40 mg dose Lipitor for hyperlipidemia Her last LDL was 52 done in 04/2017      Lab Results  Component Value Date   CHOL 112 07/18/2016   HDL 53.70 07/18/2016   LDLCALC 36 07/18/2016   LDLDIRECT 37.8 10/27/2013   TRIG 111.0 07/18/2016   CHOLHDL 2 07/18/2016       The blood pressure has been treated with multiple medications including 10 mg amlodipine  Followed by other physicians Including cardiologist and nephrologist   BP Readings from Last 3 Encounters:  03/09/18 128/70  01/22/18 120/60  12/30/17 (!) 154/76       She has had history of swelling of legs and is on 80 mg Lasix      Chronic kidney disease followed by nephrologist about every 3 months Most recent creatinine was 2.5   Lab Results  Component Value Date   CREATININE 1.83 (H) 09/04/2017   Last diabetic foot exam in 2/17     She has weakness in her legs since 2014; needs a walker and cannot walk Much  Physical Examination:  BP 128/70 (BP Location: Left Arm, Patient Position: Sitting, Cuff Size: Normal)   Pulse 92   Ht 5\' 4"  (1.626 m)   Wt 178 lb 12.8 oz (81.1 kg)   SpO2 97%   BMI 30.69 kg/m     ASSESSMENT:  Diabetes type 2, long-standing  See history of present illness for detailed discussion of his current management, blood sugar patterns and problems identified  She has a relatively low A1c  probably because of her renal failure  However her main difficulty is controlling postprandial readings at night Blood sugars are averaging over 200 after about 4 hours of eating her dinner This is most likely from drinking sweet tea regularly recently Previously her Humalog had been stopped since sugars were not significantly high postprandially and she was told to take it only when eating desserts Also not taking Victoza consistently at the same time   RENAL dysfunction: Followed by nephrologist and will review records when available later this month   HYPERTENSION: Her blood pressure is controlled, to follow-up with her PCP and cardiologist   PLAN:  Take Victoza at the same time daily and continue 1.2 mg since it is usually causing her to get into the donut hole  May still use Humalog when eating a large meal or eating dessert in the evening, she will continue to use 5 to 6 units as needed She will stop drinking sweet tea She will call if blood sugars are persistently high with stopping her sweet tea  Follow-up in 3 months again and also recheck fructosamine  There are no Patient Instructions on file for this visit.     Elayne Snare 03/09/2018, 9:40 AM   Note: This office note was prepared with Dragon voice recognition system technology. Any transcriptional errors that result from this process are unintentional.

## 2018-03-09 NOTE — Patient Instructions (Signed)
Check blood sugars on waking up 2 days a week  Also check blood sugars about 2 hours after meals and do this after different meals by rotation  Recommended blood sugar levels on waking up are 90-130 and about 2 hours after meal is 130-160  Please bring your blood sugar monitor to each visit, thank you  Take Victoza same time daily

## 2018-03-11 ENCOUNTER — Other Ambulatory Visit: Payer: Self-pay | Admitting: Interventional Cardiology

## 2018-03-12 ENCOUNTER — Ambulatory Visit (INDEPENDENT_AMBULATORY_CARE_PROVIDER_SITE_OTHER): Payer: Medicare Other | Admitting: Podiatry

## 2018-03-12 DIAGNOSIS — M79675 Pain in left toe(s): Secondary | ICD-10-CM

## 2018-03-12 DIAGNOSIS — B351 Tinea unguium: Secondary | ICD-10-CM

## 2018-03-12 DIAGNOSIS — M79674 Pain in right toe(s): Secondary | ICD-10-CM | POA: Diagnosis not present

## 2018-03-12 NOTE — Patient Instructions (Signed)
Diabetes Mellitus and Foot Care  Foot care is an important part of your health, especially when you have diabetes. Diabetes may cause you to have problems because of poor blood flow (circulation) to your feet and legs, which can cause your skin to:   Become thinner and drier.   Break more easily.   Heal more slowly.   Peel and crack.  You may also have nerve damage (neuropathy) in your legs and feet, causing decreased feeling in them. This means that you may not notice minor injuries to your feet that could lead to more serious problems. Noticing and addressing any potential problems early is the best way to prevent future foot problems.  How to care for your feet  Foot hygiene   Wash your feet daily with warm water and mild soap. Do not use hot water. Then, pat your feet and the areas between your toes until they are completely dry. Do not soak your feet as this can dry your skin.   Trim your toenails straight across. Do not dig under them or around the cuticle. File the edges of your nails with an emery board or nail file.   Apply a moisturizing lotion or petroleum jelly to the skin on your feet and to dry, brittle toenails. Use lotion that does not contain alcohol and is unscented. Do not apply lotion between your toes.  Shoes and socks   Wear clean socks or stockings every day. Make sure they are not too tight. Do not wear knee-high stockings since they may decrease blood flow to your legs.   Wear shoes that fit properly and have enough cushioning. Always look in your shoes before you put them on to be sure there are no objects inside.   To break in new shoes, wear them for just a few hours a day. This prevents injuries on your feet.  Wounds, scrapes, corns, and calluses   Check your feet daily for blisters, cuts, bruises, sores, and redness. If you cannot see the bottom of your feet, use a mirror or ask someone for help.   Do not cut corns or calluses or try to remove them with medicine.   If you  find a minor scrape, cut, or break in the skin on your feet, keep it and the skin around it clean and dry. You may clean these areas with mild soap and water. Do not clean the area with peroxide, alcohol, or iodine.   If you have a wound, scrape, corn, or callus on your foot, look at it several times a day to make sure it is healing and not infected. Check for:  ? Redness, swelling, or pain.  ? Fluid or blood.  ? Warmth.  ? Pus or a bad smell.  General instructions   Do not cross your legs. This may decrease blood flow to your feet.   Do not use heating pads or hot water bottles on your feet. They may burn your skin. If you have lost feeling in your feet or legs, you may not know this is happening until it is too late.   Protect your feet from hot and cold by wearing shoes, such as at the beach or on hot pavement.   Schedule a complete foot exam at least once a year (annually) or more often if you have foot problems. If you have foot problems, report any cuts, sores, or bruises to your health care provider immediately.  Contact a health care provider if:     You have a medical condition that increases your risk of infection and you have any cuts, sores, or bruises on your feet.   You have an injury that is not healing.   You have redness on your legs or feet.   You feel burning or tingling in your legs or feet.   You have pain or cramps in your legs and feet.   Your legs or feet are numb.   Your feet always feel cold.   You have pain around a toenail.  Get help right away if:   You have a wound, scrape, corn, or callus on your foot and:  ? You have pain, swelling, or redness that gets worse.  ? You have fluid or blood coming from the wound, scrape, corn, or callus.  ? Your wound, scrape, corn, or callus feels warm to the touch.  ? You have pus or a bad smell coming from the wound, scrape, corn, or callus.  ? You have a fever.  ? You have a red line going up your leg.  Summary   Check your feet every day  for cuts, sores, red spots, swelling, and blisters.   Moisturize feet and legs daily.   Wear shoes that fit properly and have enough cushioning.   If you have foot problems, report any cuts, sores, or bruises to your health care provider immediately.   Schedule a complete foot exam at least once a year (annually) or more often if you have foot problems.  This information is not intended to replace advice given to you by your health care provider. Make sure you discuss any questions you have with your health care provider.  Document Released: 03/07/2000 Document Revised: 04/22/2017 Document Reviewed: 04/11/2016  Elsevier Interactive Patient Education  2019 [employer]

## 2018-03-19 ENCOUNTER — Telehealth: Payer: Self-pay | Admitting: Interventional Cardiology

## 2018-03-19 DIAGNOSIS — N183 Chronic kidney disease, stage 3 (moderate): Secondary | ICD-10-CM | POA: Diagnosis not present

## 2018-03-19 DIAGNOSIS — E1122 Type 2 diabetes mellitus with diabetic chronic kidney disease: Secondary | ICD-10-CM | POA: Diagnosis not present

## 2018-03-19 DIAGNOSIS — N189 Chronic kidney disease, unspecified: Secondary | ICD-10-CM | POA: Diagnosis not present

## 2018-03-19 DIAGNOSIS — D631 Anemia in chronic kidney disease: Secondary | ICD-10-CM | POA: Diagnosis not present

## 2018-03-19 DIAGNOSIS — N184 Chronic kidney disease, stage 4 (severe): Secondary | ICD-10-CM | POA: Diagnosis not present

## 2018-03-19 DIAGNOSIS — N2581 Secondary hyperparathyroidism of renal origin: Secondary | ICD-10-CM | POA: Diagnosis not present

## 2018-03-19 DIAGNOSIS — I4891 Unspecified atrial fibrillation: Secondary | ICD-10-CM

## 2018-03-19 NOTE — Telephone Encounter (Signed)
New Message:      Please call, pt thinks she needs her Metoprolol increased. She have been feeling tired in her chest.

## 2018-03-19 NOTE — Telephone Encounter (Signed)
Pt states that the last time she saw Dr Tamala Julian, he said that he would increased the  metoprolol medication if she needed. Pt said that for the last 2 weeks she has been feeling some heaviness in her chest and feel a little tired when she gets up and moves around. Otherwise she feels fine. Pt denies having palpitations. Pt takes Metoprolol tartrate 75 mg twice a day. Pt is aware that Dr Tamala Julian is on vacation this week. MD will be back next week. Pt states she feels fine now and will be okay for MD to make recommendations when he get back.

## 2018-03-21 NOTE — Telephone Encounter (Signed)
She needs 24 hour monitor.

## 2018-03-22 NOTE — Telephone Encounter (Signed)
Spoke with pt and she is agreeable to monitor.  Advised I would place order and have scheduling team contact her to schedule.

## 2018-03-23 ENCOUNTER — Ambulatory Visit (INDEPENDENT_AMBULATORY_CARE_PROVIDER_SITE_OTHER): Payer: Medicare Other

## 2018-03-23 ENCOUNTER — Ambulatory Visit (INDEPENDENT_AMBULATORY_CARE_PROVIDER_SITE_OTHER): Payer: Medicare Other | Admitting: *Deleted

## 2018-03-23 DIAGNOSIS — I4891 Unspecified atrial fibrillation: Secondary | ICD-10-CM

## 2018-03-23 DIAGNOSIS — Z5181 Encounter for therapeutic drug level monitoring: Secondary | ICD-10-CM

## 2018-03-23 LAB — POCT INR: INR: 2.5 (ref 2.0–3.0)

## 2018-03-23 NOTE — Patient Instructions (Signed)
Description   Continue on same dosage 1 tablet daily except 1.5 tablets on Wednesdays and Fridays. Recheck INR in 5 weeks with MD appt. Amio d/c on 12/16/17. [facility] [phone]

## 2018-04-01 ENCOUNTER — Telehealth: Payer: Self-pay | Admitting: Interventional Cardiology

## 2018-04-01 NOTE — Telephone Encounter (Signed)
Will route to Dr. Tamala Julian and have him review results.

## 2018-04-01 NOTE — Telephone Encounter (Signed)
  Patient would like results of heart monitor

## 2018-04-02 MED ORDER — DILTIAZEM HCL ER COATED BEADS 180 MG PO CP24: 180.0000 mg | ORAL_CAPSULE | Freq: Every day | ORAL | 0 refills | Status: DC

## 2018-04-02 MED ORDER — METOPROLOL TARTRATE 100 MG PO TABS: 100.0000 mg | ORAL_TABLET | Freq: Two times a day (BID) | ORAL | 0 refills | Status: DC

## 2018-04-02 MED ORDER — AMLODIPINE BESYLATE 10 MG PO TABS: 5.0000 mg | ORAL_TABLET | Freq: Every day | ORAL | 0 refills | Status: DC

## 2018-04-02 NOTE — Telephone Encounter (Signed)
Let her know that the monitor reveals heart rate has gotten too fast. Stop amlodipine. Start diltiazem CD 180 mg/day. Needs follow-up with team member in 2 to 4 weeks with an EKG to reassess blood pressure and heart rate control.

## 2018-04-02 NOTE — Telephone Encounter (Signed)
Called and spoke to the patient and made her aware of her monitor results. Made patient aware of Dr. Thompson Caul recommendations to stop amlodipine and start diltiazem 180 mg QD. When sending in RX for diltiazem interaction noted between diltiazem and colchicine. Called pharmacy and instructed not to fill Rx for diltiazem. Discussed with Dr. Tamala Julian and patient will increase metoprolol to 100 mg BID instead of starting diltiazem. Patient's BP around 120/60 and per Dr. Tamala Julian patient will continue taking amlodipine 5 mg QD for now. Instructed the patient to monitor her HR and BP and let us know he she becomes hypotensive. Patient already has f/u with Dr. Tamala Julian on 2/6 and will keep this appointment. Patient verbalized understanding to all instruction and thanked me for the call.

## 2018-04-16 ENCOUNTER — Encounter: Payer: Self-pay | Admitting: Podiatry

## 2018-04-16 NOTE — Progress Notes (Signed)
Subjective: [name] presents today with painful, thick toenails 1-5 b/l that she cannot cut and which interfere with daily activities.  Pain is aggravated when wearing enclosed shoe gear.  Rankins, Bill Salinas, MD is her PCP.    Current Outpatient Medications:  .  acidophilus (RISAQUAD) CAPS capsule, Take 1 capsule by mouth daily as needed (GI issues). , Disp: , Rfl:  .  atorvastatin (LIPITOR) 40 MG tablet, Take 40 mg by mouth every evening. , Disp: , Rfl:  .  B-D UF III MINI PEN NEEDLES 31G X 5 MM MISC, USE 3 PEN NEEDLES PER DAY, Disp: 100 each, Rfl: 1 .  colchicine 0.6 MG tablet, Take 0.6 mg by mouth daily. , Disp: , Rfl:  .  Cranberry 250 MG CAPS, Take 750 mg by mouth daily. , Disp: , Rfl:  .  econazole nitrate 1 % cream, Apply 1 application topically daily as needed (to groin rash). , Disp: , Rfl:  .  esomeprazole (NEXIUM) 20 MG capsule, Take 20 mg by mouth daily before breakfast. , Disp: , Rfl:  .  ferrous sulfate 325 (65 FE) MG tablet, Take 325 mg by mouth daily with breakfast., Disp: , Rfl:  .  fluticasone (FLONASE) 50 MCG/ACT nasal spray, Place 2 sprays into both nostrils daily as needed for rhinitis. , Disp: , Rfl:  .  furosemide (LASIX) 80 MG tablet, TAKE 1 TABLET BY MOUTH EVERY MORNING AND 1/2 TABLET EVERY EVENING, Disp: , Rfl: 12 .  hydrALAZINE (APRESOLINE) 25 MG tablet, Take 3 tablets (75 mg total) by mouth 3 (three) times daily., Disp: 270 tablet, Rfl: 11 .  insulin lispro (HUMALOG KWIKPEN) 100 UNIT/ML KiwkPen, Inject 7 units before breakfast, Disp: 5 pen, Rfl: 2 .  metolazone (ZAROXOLYN) 5 MG tablet, Take 5 mg by mouth daily as needed (for edema). , Disp: , Rfl: 3 .  ONETOUCH VERIO test strip, USE 1 STRIP TO CHECK GLUCOSE TWICE DAILY AS DIRECTED, Disp: 100 each, Rfl: 3 .  polyethylene glycol (MIRALAX / GLYCOLAX) packet, Take 17 g by mouth daily as needed for mild constipation., Disp: , Rfl:  .  potassium chloride (K-DUR) 10 MEQ tablet, TAKE 3 TABLETS BY MOUTH EVERY MORNING  AND TAKE 3 TABLETS BY MOUTH EVERY EVENING, Disp: 180 tablet, Rfl: 10 .  VICTOZA 18 MG/3ML SOPN, INJECT 1.2MG  SUBCUTANEOUSLY DAILY, Disp: 9 pen, Rfl: 3 .  warfarin (COUMADIN) 5 MG tablet, TAKE AS DIRECTED BY COUMADIN CLINIC, Disp: 40 tablet, Rfl: 3 .  amLODipine (NORVASC) 10 MG tablet, Take 0.5 tablets (5 mg total) by mouth daily at 12 noon., Disp: 90 tablet, Rfl: 0 .  metoprolol tartrate (LOPRESSOR) 100 MG tablet, Take 1 tablet (100 mg total) by mouth 2 (two) times daily., Disp: 180 tablet, Rfl: 0  Allergies  Allergen Reactions  . Lotensin [Benazepril Hcl] Anaphylaxis  . Zantac [Ranitidine Hcl] Anaphylaxis  . Tape Itching and Rash    Objective:  Vascular Examination: Capillary refill time <4 seconds x 10 digits  Dorsalis pedis and Posterior tibial pulses nonpalpable b/l  Digital hair present x 10 digits  Skin temperature gradient WNL b/l  Mild varicosities with mild edema BLE; no calf pain on compression  Dermatological Examination: Skin with normal turgor, texture and tone b/l  Toenails 1-5 b/l discolored, thick, dystrophic with subungual debris and pain with palpation to nailbeds due to thickness of nails.  Musculoskeletal: Muscle strength 5/5 to all LE muscle groups  No gross bony deformities b/l.  No pain, crepitus or joint  limitation noted with ROM.   Neurological: Sensation intact with 10 gram monofilament.   Assessment: Painful onychomycosis toenails 1-5 b/l   Plan: 1. Toenails 1-5 b/l were debrided in length and girth without iatrogenic bleeding. 2. Patient to continue soft, supportive shoe gear 3. Patient to report any pedal injuries to medical professional immediately. 4. Follow up 3 months. Patient/POA to call should there be a concern in the interim.

## 2018-04-28 NOTE — Progress Notes (Signed)
Cardiology Office Note:    Date:  04/29/2018   ID:  [name], DOB 1933/04/12, MRN [id number]  PCP:  Aretta Nip, MD  Cardiologist:  Sinclair Grooms, MD   Referring MD: Aretta Nip, MD   Chief Complaint  Patient presents with  . Congestive Heart Failure    History of Present Illness:    [name] is a 85 y.o. female with a hx of paroxysmal atrial fib, amiodarone therapy, chronic diastolic heart failure, elderly and frail.  She is here today in follow-up of atrial fibrillation, chronic diastolic heart failure, volume overload with chronic pulmonary congestion, and recent decision to discontinue amiodarone after she redeveloped atrial fibrillation.  We have been attempting to adjust for the development of atrial fibrillation by increasing metoprolol.  She still states that there is some exertional dyspnea and chest tightness.  Today her heart rate at rest is 90 bpm.  She has some orthopnea.  She has left greater than right lower extremity swelling although this is better than typical.  Past Medical History:  Diagnosis Date  . Anemia due to blood loss, chronic 08/23/2013  . Atrial fibrillation ([place]) 12/09/2012  . Candidal skin infection 12/23/2012  . Chronic diastolic heart failure ([place]) 08/23/2013  . Chronic kidney disease, stage IV (severe) ([place]) 12/09/2012  . Diabetes mellitus with renal complications ([place]) 18/04/9935  . Diabetes mellitus without complication ([place])   . Dyslipidemia 12/09/2012  . Fall at home Sept. 3, 2016   Fx  3 ribs  . GERD (gastroesophageal reflux disease) 12/23/2012  . HTN (hypertension) 12/09/2012  . Hyperlipidemia 12/23/2012  . Hypertension   . Ileus, postoperative ([place]) 12/17/2012  . Intractable pain 12/03/2014  . Irregular heart beat   . Long term current use of anticoagulant therapy 02/22/2013  . On amiodarone therapy 02/22/2013  . Peptic esophagitis 08/23/2013  . Pressure injury of skin 05/21/2016  . Renal disorder   . Type II diabetes  mellitus, uncontrolled ([place]) 09/06/2013  . Umbilical hernia, incarcerated - reducible 12/10/2012    Past Surgical History:  Procedure Laterality Date  . BREAST LUMPECTOMY Right 2001   2 lumpectomies with radiation both 2001  . CATARACT EXTRACTION, BILATERAL    . HERNIA REPAIR    . I&D EXTREMITY Right 05/19/2016   Procedure: MINOR IRRIGATION AND DEBRIDEMENT Right great toe;  Surgeon: Gaynelle Arabian, MD;  Location: [facility];  Service: Orthopedics;  Laterality: Right;  . INSERTION OF MESH N/A 12/12/2012   Procedure: INSERTION OF MESH;  Surgeon: Madilyn Hook, DO;  Location: [facility];  Service: General;  Laterality: N/A;  . LAPAROSCOPIC LYSIS OF ADHESIONS N/A 12/12/2012   Procedure: LAPAROSCOPIC LYSIS OF ADHESIONS;  Surgeon: Madilyn Hook, DO;  Location: [facility];  Service: General;  Laterality: N/A;  . Right Lumpectomy    . VENTRAL HERNIA REPAIR N/A 12/12/2012   Procedure: LAPAROSCOPIC VENTRAL HERNIA;  Surgeon: Madilyn Hook, DO;  Location: [facility];  Service: General;  Laterality: N/A;    Current Medications: Current Meds  Medication Sig  . acidophilus (RISAQUAD) CAPS capsule Take 1 capsule by mouth daily as needed (GI issues).   Marland Kitchen atorvastatin (LIPITOR) 40 MG tablet Take 40 mg by mouth every evening.   . B-D UF III MINI PEN NEEDLES 31G X 5 MM MISC USE 3 PEN NEEDLES PER DAY  . colchicine 0.6 MG tablet Take 0.6 mg by mouth daily.   . Cranberry 250 MG CAPS Take 750 mg by mouth daily.   Marland Kitchen econazole  nitrate 1 % cream Apply 1 application topically daily as needed (to groin rash).   Marland Kitchen esomeprazole (NEXIUM) 20 MG capsule Take 20 mg by mouth daily before breakfast.   . ferrous sulfate 325 (65 FE) MG tablet Take 325 mg by mouth daily with breakfast.  . fluticasone (FLONASE) 50 MCG/ACT nasal spray Place 2 sprays into both nostrils daily as needed for rhinitis.   . hydrALAZINE (APRESOLINE) 25 MG tablet Take 3 tablets (75 mg total) by mouth 3 (three) times daily.  . insulin lispro (HUMALOG KWIKPEN) 100 UNIT/ML  KiwkPen Inject 7 units before breakfast  . metolazone (ZAROXOLYN) 5 MG tablet Take 5 mg by mouth daily as needed (for edema).   . metoprolol tartrate (LOPRESSOR) 100 MG tablet Take 1 tablet (100 mg total) by mouth 2 (two) times daily.  Glory Rosebush VERIO test strip USE 1 STRIP TO CHECK GLUCOSE TWICE DAILY AS DIRECTED  . polyethylene glycol (MIRALAX / GLYCOLAX) packet Take 17 g by mouth daily as needed for mild constipation.  . potassium chloride (K-DUR) 10 MEQ tablet TAKE 3 TABLETS BY MOUTH EVERY MORNING AND TAKE 3 TABLETS BY MOUTH EVERY EVENING  . VICTOZA 18 MG/3ML SOPN INJECT 1.2MG SUBCUTANEOUSLY DAILY  . warfarin (COUMADIN) 5 MG tablet TAKE AS DIRECTED BY COUMADIN CLINIC  . [DISCONTINUED] amLODipine (NORVASC) 10 MG tablet Take 0.5 tablets (5 mg total) by mouth daily at 12 noon.  . [DISCONTINUED] furosemide (LASIX) 80 MG tablet TAKE 1 TABLET BY MOUTH EVERY MORNING AND 1/2 TABLET EVERY EVENING     Allergies:   Lotensin [benazepril hcl]; Zantac [ranitidine hcl]; and Tape   Social History   Socioeconomic History  . Marital status: Married    Spouse name: Not on file  . Number of children: Not on file  . Years of education: Not on file  . Highest education level: Not on file  Occupational History  . Not on file  Social Needs  . Financial resource strain: Not on file  . Food insecurity:    Worry: Not on file    Inability: Not on file  . Transportation needs:    Medical: Not on file    Non-medical: Not on file  Tobacco Use  . Smoking status: Former Smoker    Types: Cigarettes  . Smokeless tobacco: Never Used  Substance and Sexual Activity  . Alcohol use: No  . Drug use: No  . Sexual activity: Never  Lifestyle  . Physical activity:    Days per week: Not on file    Minutes per session: Not on file  . Stress: Not on file  Relationships  . Social connections:    Talks on phone: Not on file    Gets together: Not on file    Attends religious service: Not on file    Active member  of club or organization: Not on file    Attends meetings of clubs or organizations: Not on file    Relationship status: Not on file  Other Topics Concern  . Not on file  Social History Narrative  . Not on file     Family History: The patient's family history includes Diabetes in her mother and sister.  ROS:   Please see the history of present illness.    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  All other systems reviewed and are negative.  EKGs/Labs/Other Studies Reviewed:    The following studies were reviewed today: No recent repeated cardiac graphic or functional data  EKG:  EKG is not performed this morning  Recent Labs: 08/21/2017: Hemoglobin 12.0; Platelets 247 09/04/2017: BUN 40; Creatinine, Ser 1.83; Potassium 3.8; Sodium 140 11/26/2017: ALT 42; TSH 1.930  Recent Lipid Panel    Component Value Date/Time   CHOL 112 07/18/2016 1005   TRIG 111.0 07/18/2016 1005   HDL 53.70 07/18/2016 1005   CHOLHDL 2 07/18/2016 1005   VLDL 22.2 07/18/2016 1005   LDLCALC 36 07/18/2016 1005   LDLDIRECT 37.8 10/27/2013 0808    Physical Exam:    VS:  BP (!) 148/82   Pulse 90   Ht _0  (1.626 m)   Wt 177 lb (80.3 kg)   SpO2 97%   BMI 30.38 kg/m     Wt Readings from Last 3 Encounters:  04/29/18 177 lb (80.3 kg)  03/09/18 178 lb 12.8 oz (81.1 kg)  01/22/18 176 lb 12.8 oz (80.2 kg)     GEN: Elderly, frail.. No acute distress HEENT: Normal NECK: Moderate JVD while sitting LYMPHATICS: No lymphadenopathy CARDIAC: Irregularly irregular with moderate rate control.  1/6 systolic apical murmur, no gallop, 1-2+ left lower extremity and trace to 1+ right lower extremity edema VASCULAR: Radial pulses soft and difficult to palpate, no carotid bruits RESPIRATORY:  Clear to auscultation without rales, wheezing or rhonchi  ABDOMEN: Soft, non-tender, non-distended, No pulsatile mass, MUSCULOSKELETAL: No deformity  SKIN: Warm and dry NEUROLOGIC:  Alert and oriented x  3 PSYCHIATRIC:  Normal affect   ASSESSMENT:    1. Chronic atrial fibrillation   2. Chronic diastolic heart failure ([place])   3. Essential hypertension   4. On amiodarone therapy   5. Long term current use of anticoagulant therapy   6. Chronic kidney disease, stage IV (severe) ([place])   7. Hyperlipidemia, unspecified hyperlipidemia type   8. Uncontrolled type 2 diabetes mellitus with hyperglycemia ([place])   9. Dyslipidemia    PLAN:    In order of problems listed above:  1. Relatively increased ventricular response.  When in sinus rhythm heart rates are usually in the 60 bpm range.  Increased dyspnea and swelling with orthopnea likely related to diastolic heart failure.  We will plan to further control heart rate by adding diltiazem 120 mg/day and discontinuing amlodipine.  She will return in 7 days for EKG.  We have to be careful not to develop excessive bradycardia in the setting of renal insufficiency. 2. Increase furosemide to 80 mg twice daily.  Comprehensive metabolic panel in 7 days at the time EKG is performed. 3. BP is elevated today although she has not yet had medications this morning. 4. Amiodarone has been discontinued. 5. She is on chronic Coumadin therapy.  The possibility of left lower extremity DVT is therefore much less likely. 6. Not discussed 7. Not discussed 8. Not discussed 9. Not discussed 10. Gout is given to patient significant problems.  I recommended that they discuss with Dr. Justin Mend the possibility of using prednisone and also ensure that twice daily colchicine is safe given her chronic kidney disease stage IV.  ECG and C met in 7 to 10 days.Blood work will also allow reevaluation of liver enzymes which are mildly elevated in September.   Clinical follow-up in 4 to 6 months assuming heart rate control is adequate and laboratory data reasonable.   She is developing increasing risk of sudden cardiac/electrolyte disturbance that could be life-threatening.  We need to  watch closely.   Medication Adjustments/Labs and Tests Ordered: Current medicines are reviewed at length with the patient today.  Concerns regarding medicines are outlined above.  Orders Placed This Encounter  Procedures  . B Nat Peptide  . Basic Metabolic Panel (BMET)  . Hepatic function panel   Meds ordered this encounter  Medications  . furosemide (LASIX) 80 MG tablet    Sig: Take 1 tablet (80 mg total) by mouth 2 (two) times daily.    Dispense:  180 tablet    Refill:  3  . diltiazem (CARDIZEM CD) 120 MG 24 hr capsule    Sig: Take 1 capsule (120 mg total) by mouth daily.    Dispense:  30 capsule    Refill:  11    Patient Instructions  Medication Instructions:  Your physician has recommended you make the following change in your medication:  STOP Norvasc (Amlodipine) START Diltiazem (Cardizem) 120 mg once daily INCREASE Furosemide (Lasix) to 80 mg two times per day   If you need a refill on your cardiac medications before your next appointment, please call your pharmacy.    Lab work: Your physician recommends that you return for lab work on Tuesday Feb. 18 You do not have to fast for your lab work    Testing/Procedures: None Ordered    Follow-Up: Your physician recommends that you return for a follow-up appointment on Tuesday Feb. 18 at 11 am for EKG with Nurse  Your physician recommends that you return for a follow-up appointment on Thursday June 11 at 9:20 am with Dr. Tamala Julian     Signed, Sinclair Grooms, MD  04/29/2018 9:46 AM    [place]

## 2018-04-29 ENCOUNTER — Encounter: Payer: Self-pay | Admitting: Interventional Cardiology

## 2018-04-29 ENCOUNTER — Ambulatory Visit (INDEPENDENT_AMBULATORY_CARE_PROVIDER_SITE_OTHER): Payer: Medicare Other | Admitting: *Deleted

## 2018-04-29 ENCOUNTER — Ambulatory Visit (INDEPENDENT_AMBULATORY_CARE_PROVIDER_SITE_OTHER): Payer: Medicare Other | Admitting: Interventional Cardiology

## 2018-04-29 VITALS — BP 148/82 | HR 90 | Ht 64.0 in | Wt 177.0 lb

## 2018-04-29 DIAGNOSIS — E785 Hyperlipidemia, unspecified: Secondary | ICD-10-CM

## 2018-04-29 DIAGNOSIS — Z79899 Other long term (current) drug therapy: Secondary | ICD-10-CM

## 2018-04-29 DIAGNOSIS — I482 Chronic atrial fibrillation, unspecified: Secondary | ICD-10-CM

## 2018-04-29 DIAGNOSIS — Z5181 Encounter for therapeutic drug level monitoring: Secondary | ICD-10-CM

## 2018-04-29 DIAGNOSIS — I4891 Unspecified atrial fibrillation: Secondary | ICD-10-CM

## 2018-04-29 DIAGNOSIS — Z7901 Long term (current) use of anticoagulants: Secondary | ICD-10-CM

## 2018-04-29 DIAGNOSIS — E1165 Type 2 diabetes mellitus with hyperglycemia: Secondary | ICD-10-CM

## 2018-04-29 DIAGNOSIS — N184 Chronic kidney disease, stage 4 (severe): Secondary | ICD-10-CM

## 2018-04-29 DIAGNOSIS — I5032 Chronic diastolic (congestive) heart failure: Secondary | ICD-10-CM | POA: Diagnosis not present

## 2018-04-29 DIAGNOSIS — I10 Essential (primary) hypertension: Secondary | ICD-10-CM | POA: Diagnosis not present

## 2018-04-29 LAB — POCT INR: INR: 2.2 (ref 2.0–3.0)

## 2018-04-29 MED ORDER — DILTIAZEM HCL ER COATED BEADS 120 MG PO CP24: 120.0000 mg | ORAL_CAPSULE | Freq: Every day | ORAL | 11 refills | Status: DC

## 2018-04-29 MED ORDER — FUROSEMIDE 80 MG PO TABS: 80.0000 mg | ORAL_TABLET | Freq: Two times a day (BID) | ORAL | 3 refills | Status: DC

## 2018-04-29 NOTE — Patient Instructions (Addendum)
Medication Instructions:  Your physician has recommended you make the following change in your medication:  STOP Norvasc (Amlodipine) START Diltiazem (Cardizem) 120 mg once daily INCREASE Furosemide (Lasix) to 80 mg two times per day   If you need a refill on your cardiac medications before your next appointment, please call your pharmacy.    Lab work: Your physician recommends that you return for lab work on Tuesday Feb. 18 You do not have to fast for your lab work    Testing/Procedures: None Ordered    Follow-Up: Your physician recommends that you return for a follow-up appointment on Tuesday Feb. 18 at 11 am for EKG with Nurse  Your physician recommends that you return for a follow-up appointment on Thursday June 11 at 9:20 am with Dr. Tamala Julian

## 2018-04-29 NOTE — Patient Instructions (Addendum)
Description   Continue taking 1 tablet daily except 1.5 tablets on Wednesdays and Fridays. Recheck INR in 6 weeks. Amio d/c on 12/16/17. [facility] [phone]

## 2018-05-03 NOTE — Addendum Note (Signed)
Addended by: Emmaline Life on: 05/03/2018 02:20 PM   Modules accepted: Orders

## 2018-05-11 ENCOUNTER — Ambulatory Visit (INDEPENDENT_AMBULATORY_CARE_PROVIDER_SITE_OTHER): Payer: Medicare Other | Admitting: Pharmacist

## 2018-05-11 ENCOUNTER — Other Ambulatory Visit: Payer: Medicare Other

## 2018-05-11 ENCOUNTER — Ambulatory Visit (INDEPENDENT_AMBULATORY_CARE_PROVIDER_SITE_OTHER): Payer: Medicare Other | Admitting: *Deleted

## 2018-05-11 VITALS — HR 100 | Ht 64.0 in | Wt 177.0 lb

## 2018-05-11 DIAGNOSIS — I5032 Chronic diastolic (congestive) heart failure: Secondary | ICD-10-CM | POA: Diagnosis not present

## 2018-05-11 DIAGNOSIS — Z5181 Encounter for therapeutic drug level monitoring: Secondary | ICD-10-CM | POA: Diagnosis not present

## 2018-05-11 DIAGNOSIS — I4891 Unspecified atrial fibrillation: Secondary | ICD-10-CM

## 2018-05-11 DIAGNOSIS — I482 Chronic atrial fibrillation, unspecified: Secondary | ICD-10-CM

## 2018-05-11 DIAGNOSIS — I10 Essential (primary) hypertension: Secondary | ICD-10-CM | POA: Diagnosis not present

## 2018-05-11 DIAGNOSIS — Z79899 Other long term (current) drug therapy: Secondary | ICD-10-CM | POA: Diagnosis not present

## 2018-05-11 LAB — POCT INR: INR: 2.6 (ref 2.0–3.0)

## 2018-05-11 MED ORDER — DILTIAZEM HCL ER COATED BEADS 180 MG PO CP24: 180.0000 mg | ORAL_CAPSULE | Freq: Every day | ORAL | 11 refills | Status: DC

## 2018-05-11 NOTE — Progress Notes (Signed)
1.) Reason for visit: EKG  2.) Name of MD requesting visit: Dr. Tamala Julian  3.) ROS related to problem: Pt in today for EKG to f/u medication changes on 04/29/2018.  Amlodipine was d/c'ed, Diltiazem 120mg  QD was started and Furosemide was increased to 80mg  QD.  Pt denies lightheadedness or dizziness. Pt states SOB has improved.  Swelling has improved slightly since last appt as well.    4.) Assessment and plan per MD: Discussed with Dr. Marlou Porch.  Due to HR fluctuating from upper 90's to low 100's while hooked up to EKG machine, he advised to increase Diltiazem to 180mg  QD and come back in a week for another EKG.  Scheduled pt to see Dr. Tamala Julian 2/25.

## 2018-05-11 NOTE — Patient Instructions (Signed)
Description   Continue taking 1 tablet daily except 1.5 tablets on Wednesdays and Fridays. Recheck INR in 6 weeks. Amio d/c on 12/16/17. [facility] [phone]

## 2018-05-12 ENCOUNTER — Telehealth: Payer: Self-pay

## 2018-05-12 LAB — PRO B NATRIURETIC PEPTIDE: NT-Pro BNP: 19800 pg/mL — ABNORMAL HIGH (ref 0–738)

## 2018-05-12 LAB — BASIC METABOLIC PANEL
BUN/Creatinine Ratio: 27 (ref 12–28)
BUN: 60 mg/dL — ABNORMAL HIGH (ref 8–27)
CALCIUM: 9.3 mg/dL (ref 8.7–10.3)
CHLORIDE: 97 mmol/L (ref 96–106)
CO2: 22 mmol/L (ref 20–29)
Creatinine, Ser: 2.25 mg/dL — ABNORMAL HIGH (ref 0.57–1.00)
GFR calc Af Amer: 23 mL/min/{1.73_m2} — ABNORMAL LOW (ref >59)
GFR calc non Af Amer: 20 mL/min/{1.73_m2} — ABNORMAL LOW (ref >59)
Glucose: 273 mg/dL — ABNORMAL HIGH (ref 65–99)
Potassium: 3.5 mmol/L (ref 3.5–5.2)
Sodium: 136 mmol/L (ref 134–144)

## 2018-05-12 LAB — HEPATIC FUNCTION PANEL
ALT: 35 IU/L — AB (ref 0–32)
AST: 39 IU/L (ref 0–40)
Albumin: 3.7 g/dL (ref 3.6–4.6)
Alkaline Phosphatase: 43 IU/L (ref 39–117)
BILIRUBIN, DIRECT: 0.24 mg/dL (ref 0.00–0.40)
Bilirubin Total: 0.5 mg/dL (ref 0.0–1.2)
Total Protein: 6.1 g/dL (ref 6.0–8.5)

## 2018-05-12 MED ORDER — FUROSEMIDE 80 MG PO TABS: ORAL_TABLET | ORAL | 3 refills | Status: DC

## 2018-05-12 NOTE — Telephone Encounter (Signed)
Notes recorded by Frederik Schmidt, RN on 05/12/2018 at 12:16 PM EST Spoke to DOD (Dr Burt Knack) about the patient's abnormal lab results. Due to the elevated Creatinine 2.25, ProBNP 19,800 and stage IV kidney disease, Dr Burt Knack suggested holding Lasix dosage 80 mg tonight, twice on Thursday and twice on Friday. She can resume the Lasix 80 mg in the morning and 40 mg in the evening. She has a f/u with Dr Tamala Julian on 2/25 and we can repeat labs. I telephoned patient and gave her the recommendations. She verbalized understanding.

## 2018-05-17 NOTE — Progress Notes (Signed)
Cardiology Office Note:    Date:  05/18/2018   ID:  Melanie, Costa [birth year], MRN [id number]  PCP:  Aretta Nip, MD  Cardiologist:  Sinclair Grooms, MD   Referring MD: Aretta Nip, MD   Chief Complaint  Patient presents with  . Atrial Fibrillation    History of Present Illness:    [name] is a 85 y.o. female with a hx of hx of paroxysmal atrial fib, amiodarone therapy, chronic diastolic heart failure, elderly and frail.  Back today for heart rate check.  Feels somewhat worse since increasing diltiazem dose.  That complaint is subjective and she is unable to articulate what it really means.  Compared to the last visit with me, she is now able to cook and do chores around the house, therefore improvement seems to have occurred.  Past Medical History:  Diagnosis Date  . Anemia due to blood loss, chronic 08/23/2013  . Atrial fibrillation ([place]) 12/09/2012  . Candidal skin infection 12/23/2012  . Chronic diastolic heart failure ([place]) 08/23/2013  . Chronic kidney disease, stage IV (severe) ([place]) 12/09/2012  . Diabetes mellitus with renal complications ([place]) 07/24/9765  . Diabetes mellitus without complication ([place])   . Dyslipidemia 12/09/2012  . Fall at home Sept. 3, 2016   Fx  3 ribs  . GERD (gastroesophageal reflux disease) 12/23/2012  . HTN (hypertension) 12/09/2012  . Hyperlipidemia 12/23/2012  . Hypertension   . Ileus, postoperative ([place]) 12/17/2012  . Intractable pain 12/03/2014  . Irregular heart beat   . Long term current use of anticoagulant therapy 02/22/2013  . On amiodarone therapy 02/22/2013  . Peptic esophagitis 08/23/2013  . Pressure injury of skin 05/21/2016  . Renal disorder   . Type II diabetes mellitus, uncontrolled ([place]) 09/06/2013  . Umbilical hernia, incarcerated - reducible 12/10/2012    Past Surgical History:  Procedure Laterality Date  . BREAST LUMPECTOMY Right 2001   2 lumpectomies with radiation both 2001  . CATARACT EXTRACTION,  BILATERAL    . HERNIA REPAIR    . I&D EXTREMITY Right 05/19/2016   Procedure: MINOR IRRIGATION AND DEBRIDEMENT Right great toe;  Surgeon: Gaynelle Arabian, MD;  Location: [facility];  Service: Orthopedics;  Laterality: Right;  . INSERTION OF MESH N/A 12/12/2012   Procedure: INSERTION OF MESH;  Surgeon: Madilyn Hook, DO;  Location: [facility];  Service: General;  Laterality: N/A;  . LAPAROSCOPIC LYSIS OF ADHESIONS N/A 12/12/2012   Procedure: LAPAROSCOPIC LYSIS OF ADHESIONS;  Surgeon: Madilyn Hook, DO;  Location: [facility];  Service: General;  Laterality: N/A;  . Right Lumpectomy    . VENTRAL HERNIA REPAIR N/A 12/12/2012   Procedure: LAPAROSCOPIC VENTRAL HERNIA;  Surgeon: Madilyn Hook, DO;  Location: [facility];  Service: General;  Laterality: N/A;    Current Medications: Current Meds  Medication Sig  . acidophilus (RISAQUAD) CAPS capsule Take 1 capsule by mouth daily as needed (GI issues).   Marland Kitchen atorvastatin (LIPITOR) 40 MG tablet Take 40 mg by mouth every evening.   . B-D UF III MINI PEN NEEDLES 31G X 5 MM MISC USE 3 PEN NEEDLES PER DAY  . colchicine 0.6 MG tablet Take 0.6 mg by mouth daily.   . Cranberry 250 MG CAPS Take 750 mg by mouth daily.   Marland Kitchen diltiazem (CARDIZEM CD) 180 MG 24 hr capsule Take 1 capsule (180 mg total) by mouth daily.  Marland Kitchen econazole nitrate 1 % cream Apply 1 application topically daily as needed (to groin rash).   Marland Kitchen  esomeprazole (NEXIUM) 20 MG capsule Take 20 mg by mouth daily before breakfast.   . ferrous sulfate 325 (65 FE) MG tablet Take 325 mg by mouth daily with breakfast.  . fluticasone (FLONASE) 50 MCG/ACT nasal spray Place 2 sprays into both nostrils daily as needed for rhinitis.   . furosemide (LASIX) 80 MG tablet Take 1 pill (80 mg) in the morning and half a pill in the evening (40 mg) beginning 2/22.  . hydrALAZINE (APRESOLINE) 25 MG tablet Take 3 tablets (75 mg total) by mouth 3 (three) times daily.  . insulin lispro (HUMALOG KWIKPEN) 100 UNIT/ML KiwkPen Inject 7 units before  breakfast  . metolazone (ZAROXOLYN) 5 MG tablet Take 5 mg by mouth daily as needed (for edema).   . metoprolol tartrate (LOPRESSOR) 100 MG tablet Take 1 tablet (100 mg total) by mouth 2 (two) times daily.  Glory Rosebush VERIO test strip USE 1 STRIP TO CHECK GLUCOSE TWICE DAILY AS DIRECTED  . polyethylene glycol (MIRALAX / GLYCOLAX) packet Take 17 g by mouth daily as needed for mild constipation.  . potassium chloride (K-DUR) 10 MEQ tablet TAKE 3 TABLETS BY MOUTH EVERY MORNING AND TAKE 3 TABLETS BY MOUTH EVERY EVENING  . VICTOZA 18 MG/3ML SOPN INJECT 1.2MG SUBCUTANEOUSLY DAILY  . warfarin (COUMADIN) 5 MG tablet TAKE AS DIRECTED BY COUMADIN CLINIC     Allergies:   Lotensin [benazepril hcl]; Zantac [ranitidine hcl]; and Tape   Social History   Socioeconomic History  . Marital status: Married    Spouse name: Not on file  . Number of children: Not on file  . Years of education: Not on file  . Highest education level: Not on file  Occupational History  . Not on file  Social Needs  . Financial resource strain: Not on file  . Food insecurity:    Worry: Not on file    Inability: Not on file  . Transportation needs:    Medical: Not on file    Non-medical: Not on file  Tobacco Use  . Smoking status: Former Smoker    Types: Cigarettes  . Smokeless tobacco: Never Used  Substance and Sexual Activity  . Alcohol use: No  . Drug use: No  . Sexual activity: Never  Lifestyle  . Physical activity:    Days per week: Not on file    Minutes per session: Not on file  . Stress: Not on file  Relationships  . Social connections:    Talks on phone: Not on file    Gets together: Not on file    Attends religious service: Not on file    Active member of club or organization: Not on file    Attends meetings of clubs or organizations: Not on file    Relationship status: Not on file  Other Topics Concern  . Not on file  Social History Narrative  . Not on file     Family History: The patient's  family history includes Diabetes in her mother and sister.  ROS:   Please see the history of present illness.    Last blood work demonstrated elevated creatinine.  Adjustments have been made and blood work will be done today.  All other systems reviewed and are negative.  EKGs/Labs/Other Studies Reviewed:    The following studies were reviewed today: No new data  EKG:  EKG atrial fibrillation with controlled to slightly increased ventricular response of 99 bpm.  Right axis deviation.  Low voltage.  Recent Labs: 08/21/2017: Hemoglobin 12.0;  Platelets 247 11/26/2017: TSH 1.930 05/11/2018: ALT 35; BUN 60; Creatinine, Ser 2.25; NT-Pro BNP 19,800; Potassium 3.5; Sodium 136  Recent Lipid Panel    Component Value Date/Time   CHOL 112 07/18/2016 1005   TRIG 111.0 07/18/2016 1005   HDL 53.70 07/18/2016 1005   CHOLHDL 2 07/18/2016 1005   VLDL 22.2 07/18/2016 1005   LDLCALC 36 07/18/2016 1005   LDLDIRECT 37.8 10/27/2013 0808    Physical Exam:    VS:  BP 134/62   Pulse 88   Ht _0  (1.626 m)   Wt 177 lb 12.8 oz (80.6 kg)   SpO2 96%   BMI 30.52 kg/m     Wt Readings from Last 3 Encounters:  05/18/18 177 lb 12.8 oz (80.6 kg)  05/11/18 177 lb (80.3 kg)  04/29/18 177 lb (80.3 kg)     GEN: Obese. No acute distress HEENT: Normal NECK: No JVD. LYMPHATICS: No lymphadenopathy CARDIAC: IIRR.  No murmur, no gallop, no edema VASCULAR: 2+ bilateral radial pulses, absent bruits RESPIRATORY:  Clear to auscultation without rales, wheezing or rhonchi  ABDOMEN: Soft, non-tender, non-distended, No pulsatile mass, MUSCULOSKELETAL: No deformity  SKIN: Warm and dry NEUROLOGIC:  Alert and oriented x 3 PSYCHIATRIC:  Normal affect   ASSESSMENT:    1. Atrial fibrillation, unspecified type ([place])   2. Essential hypertension   3. On amiodarone therapy   4. Chronic diastolic heart failure ([place])   5. Long term current use of anticoagulant therapy   6. Chronic kidney disease, stage IV (severe) ([facility])     PLAN:    In order of problems listed above:  1. Heart rate is reasonably controlled and she is not very active.  She perceives some side effects from diltiazem so therefore we will leave dose unchanged. 2. Adequate blood pressure 3. Amiodarone is been discontinued 4. No evidence of volume overload.  In fact last blood work suggested volume contraction.  Be met and BNP is obtained today. 5. Continue anticoagulation therapy. 6. Basic metabolic panel today.  Clinical follow-up in 6 months.   Medication Adjustments/Labs and Tests Ordered: Current medicines are reviewed at length with the patient today.  Concerns regarding medicines are outlined above.  Orders Placed This Encounter  Procedures  . Basic metabolic panel  . EKG 12-Lead   No orders of the defined types were placed in this encounter.   There are no Patient Instructions on file for this visit.   Signed, Sinclair Grooms, MD  05/18/2018 11:28 AM    [place]

## 2018-05-18 ENCOUNTER — Ambulatory Visit: Payer: Medicare Other | Admitting: Interventional Cardiology

## 2018-05-18 ENCOUNTER — Encounter: Payer: Self-pay | Admitting: Interventional Cardiology

## 2018-05-18 VITALS — BP 134/62 | HR 88 | Ht 64.0 in | Wt 177.8 lb

## 2018-05-18 DIAGNOSIS — N184 Chronic kidney disease, stage 4 (severe): Secondary | ICD-10-CM

## 2018-05-18 DIAGNOSIS — Z7901 Long term (current) use of anticoagulants: Secondary | ICD-10-CM

## 2018-05-18 DIAGNOSIS — I10 Essential (primary) hypertension: Secondary | ICD-10-CM

## 2018-05-18 DIAGNOSIS — I5032 Chronic diastolic (congestive) heart failure: Secondary | ICD-10-CM | POA: Diagnosis not present

## 2018-05-18 DIAGNOSIS — I4891 Unspecified atrial fibrillation: Secondary | ICD-10-CM

## 2018-05-18 DIAGNOSIS — Z79899 Other long term (current) drug therapy: Secondary | ICD-10-CM | POA: Diagnosis not present

## 2018-05-18 LAB — BASIC METABOLIC PANEL
BUN/Creatinine Ratio: 26 (ref 12–28)
BUN: 53 mg/dL — AB (ref 8–27)
CHLORIDE: 102 mmol/L (ref 96–106)
CO2: 25 mmol/L (ref 20–29)
Calcium: 9.1 mg/dL (ref 8.7–10.3)
Creatinine, Ser: 2.03 mg/dL — ABNORMAL HIGH (ref 0.57–1.00)
GFR calc Af Amer: 26 mL/min/{1.73_m2} — ABNORMAL LOW (ref >59)
GFR calc non Af Amer: 22 mL/min/{1.73_m2} — ABNORMAL LOW (ref >59)
Glucose: 176 mg/dL — ABNORMAL HIGH (ref 65–99)
Potassium: 4.3 mmol/L (ref 3.5–5.2)
Sodium: 141 mmol/L (ref 134–144)

## 2018-05-18 NOTE — Patient Instructions (Signed)
Medication Instructions:  Your physician recommends that you continue on your current medications as directed. Please refer to the Current Medication list given to you today.  If you need a refill on your cardiac medications before your next appointment, please call your pharmacy.   Lab work: BMET today  If you have labs (blood work) drawn today and your tests are completely normal, you will receive your results only by: Marland Kitchen MyChart Message (if you have MyChart) OR . A paper copy in the mail If you have any lab test that is abnormal or we need to change your treatment, we will call you to review the results.  Testing/Procedures: None  Follow-Up: At [facility], you and your health needs are our priority.  As part of our continuing mission to provide you with exceptional heart care, we have created designated Provider Care Teams.  These Care Teams include your primary Cardiologist (physician) and Advanced Practice Providers (APPs -  Physician Assistants and Nurse Practitioners) who all work together to provide you with the care you need, when you need it. You will need a follow up appointment in 6 months.  Please call our office 2 months in advance to schedule this appointment.  You may see Sinclair Grooms, MD  or one of the following Advanced Practice Providers on your designated Care Team:   Truitt Merle, NP Cecilie Kicks, NP . Kathyrn Drown, NP  Any Other Special Instructions Will Be Listed Below (If Applicable).

## 2018-05-19 ENCOUNTER — Telehealth: Payer: Self-pay

## 2018-05-19 DIAGNOSIS — N184 Chronic kidney disease, stage 4 (severe): Secondary | ICD-10-CM

## 2018-05-19 DIAGNOSIS — Z79899 Other long term (current) drug therapy: Secondary | ICD-10-CM

## 2018-05-19 DIAGNOSIS — I10 Essential (primary) hypertension: Secondary | ICD-10-CM

## 2018-05-19 MED ORDER — FUROSEMIDE 80 MG PO TABS: ORAL_TABLET | ORAL | 3 refills | Status: DC

## 2018-05-19 NOTE — Telephone Encounter (Signed)
-----   Message from Belva Crome, MD sent at 05/18/2018  8:26 PM EST ----- Let the patient know Decrease furosemide to 80 mg daily.Weigh daily and call if increase in weight by 3-5 pounds. BMET in 1 week. A copy will be sent to [place], Bill Salinas, MD

## 2018-05-19 NOTE — Telephone Encounter (Signed)
Pt advised and verbalized understanding of Dr. Thompson Caul recommendations from her BMET... pt will come back 05/26/18 for repeat labs. She will continue to check her weight.

## 2018-05-21 ENCOUNTER — Telehealth: Payer: Self-pay | Admitting: Interventional Cardiology

## 2018-05-21 DIAGNOSIS — N184 Chronic kidney disease, stage 4 (severe): Secondary | ICD-10-CM

## 2018-05-21 NOTE — Telephone Encounter (Signed)
Spoke with pt and she states she is feeling well.  Weight was 170lbs yesterday and today is 175lbs.  States she weighed under the same circumstances as usual.  Denies increased salt in her diet.  Takes Furosemide 80mg  QD.  Was recently decreased due to elevated crea.  Use to take 80mg  in AM and 40mg  in PM.  Advised I will send message to Dr. Tamala Julian for review.

## 2018-05-21 NOTE — Telephone Encounter (Signed)
New Message   Pt c/o medication issue:  1. Name of Medication: furosemide   2. How are you currently taking this medication (dosage and times per day)? 80 mg 1x daily in the morning   3. Are you having a reaction (difficulty breathing--STAT)? No   4. What is your medication issue? PT is calling because her weight yesterday 170 and today it is 175.   Please call back

## 2018-05-22 NOTE — Telephone Encounter (Signed)
See what the weight is on Monday. May have to go back to higher dose therapy

## 2018-05-24 NOTE — Telephone Encounter (Signed)
Pt took extra dose of Furosemide Friday after speaking to me.  Weight was 175 Saturday, 176 Sunday and 176 today.  States she did feel like she urinated a lot more on Friday.  Has BMET scheduled for Wednesday.  Advised I will send message to Dr. Tamala Julian and call back with his recommendations.

## 2018-05-26 ENCOUNTER — Other Ambulatory Visit: Payer: Medicare Other | Admitting: *Deleted

## 2018-05-26 DIAGNOSIS — N184 Chronic kidney disease, stage 4 (severe): Secondary | ICD-10-CM

## 2018-05-26 DIAGNOSIS — Z79899 Other long term (current) drug therapy: Secondary | ICD-10-CM | POA: Diagnosis not present

## 2018-05-26 DIAGNOSIS — E1022 Type 1 diabetes mellitus with diabetic chronic kidney disease: Secondary | ICD-10-CM | POA: Diagnosis not present

## 2018-05-26 DIAGNOSIS — I10 Essential (primary) hypertension: Secondary | ICD-10-CM

## 2018-05-26 LAB — BASIC METABOLIC PANEL
BUN/Creatinine Ratio: 22 (ref 12–28)
BUN: 45 mg/dL — ABNORMAL HIGH (ref 8–27)
CO2: 21 mmol/L (ref 20–29)
Calcium: 9.3 mg/dL (ref 8.7–10.3)
Chloride: 104 mmol/L (ref 96–106)
Creatinine, Ser: 2.03 mg/dL — ABNORMAL HIGH (ref 0.57–1.00)
GFR calc Af Amer: 26 mL/min/{1.73_m2} — ABNORMAL LOW (ref >59)
GFR calc non Af Amer: 22 mL/min/{1.73_m2} — ABNORMAL LOW (ref >59)
Glucose: 152 mg/dL — ABNORMAL HIGH (ref 65–99)
POTASSIUM: 4.1 mmol/L (ref 3.5–5.2)
Sodium: 140 mmol/L (ref 134–144)

## 2018-05-27 MED ORDER — FUROSEMIDE 80 MG PO TABS: ORAL_TABLET | ORAL | 3 refills | Status: DC

## 2018-05-27 NOTE — Telephone Encounter (Signed)
Attempted to contact pt.  VM is full.  Will try again later.

## 2018-05-27 NOTE — Telephone Encounter (Signed)
Follow Up:     Returning your call from this month.

## 2018-05-27 NOTE — Telephone Encounter (Signed)
Have her go back to 80 mg of furosemide each a.m. and 40 mg each p.m.  She needs a nephrology appointment/referral if not already being seen by [facility].  The diabetes is the major contributor to the kidney dysfunction.

## 2018-05-27 NOTE — Telephone Encounter (Signed)
Spoke with pt and went over lab results and recommendations per Dr. Tamala Julian.  Pt sees Dr. Justin Mend at [facility].  She will contact them about an appt.  Advised I will send labs to them.  Pt will have repeat BMET here on the same day as her Coumadin appt on 06/22/2018.  Pt verbalized understanding and was in agreement with this plan.

## 2018-06-03 ENCOUNTER — Telehealth: Payer: Self-pay | Admitting: Interventional Cardiology

## 2018-06-03 NOTE — Telephone Encounter (Signed)
Spoke with pt and she said her daughter was worried about her coming into the office with the corona virus going on.  Advised pt normally [facility] won't come out for just blood work, they will draw labs if they are already assigned to come in for nursing care.  Pt states she will call back when it is closer to 3/31 and see if she needs to change appt.

## 2018-06-03 NOTE — Telephone Encounter (Signed)
Patient has appt for blood work on 3/31, she doesn't want to come into office, she wants to know if she can have a home nurse come to her home to have it done.

## 2018-06-09 ENCOUNTER — Other Ambulatory Visit: Payer: Self-pay | Admitting: Interventional Cardiology

## 2018-06-11 ENCOUNTER — Ambulatory Visit: Payer: Medicare Other | Admitting: Podiatry

## 2018-06-11 ENCOUNTER — Ambulatory Visit: Payer: Medicare Other | Admitting: Endocrinology

## 2018-06-21 ENCOUNTER — Telehealth: Payer: Self-pay

## 2018-06-21 NOTE — Telephone Encounter (Signed)

## 2018-06-22 ENCOUNTER — Other Ambulatory Visit: Payer: Self-pay

## 2018-06-22 ENCOUNTER — Ambulatory Visit (INDEPENDENT_AMBULATORY_CARE_PROVIDER_SITE_OTHER): Payer: Medicare Other | Admitting: Pharmacist

## 2018-06-22 ENCOUNTER — Other Ambulatory Visit: Payer: Medicare Other

## 2018-06-22 DIAGNOSIS — Z5181 Encounter for therapeutic drug level monitoring: Secondary | ICD-10-CM | POA: Diagnosis not present

## 2018-06-22 DIAGNOSIS — I4891 Unspecified atrial fibrillation: Secondary | ICD-10-CM | POA: Diagnosis not present

## 2018-06-22 LAB — POCT INR: INR: 2.4 (ref 2.0–3.0)

## 2018-06-29 ENCOUNTER — Other Ambulatory Visit: Payer: Self-pay | Admitting: Interventional Cardiology

## 2018-06-30 DIAGNOSIS — J309 Allergic rhinitis, unspecified: Secondary | ICD-10-CM | POA: Diagnosis not present

## 2018-06-30 DIAGNOSIS — I4891 Unspecified atrial fibrillation: Secondary | ICD-10-CM | POA: Diagnosis not present

## 2018-06-30 DIAGNOSIS — R05 Cough: Secondary | ICD-10-CM | POA: Diagnosis not present

## 2018-06-30 DIAGNOSIS — I5030 Unspecified diastolic (congestive) heart failure: Secondary | ICD-10-CM | POA: Diagnosis not present

## 2018-07-08 ENCOUNTER — Other Ambulatory Visit: Payer: Self-pay

## 2018-07-08 ENCOUNTER — Other Ambulatory Visit: Payer: Medicare Other | Admitting: *Deleted

## 2018-07-08 DIAGNOSIS — N184 Chronic kidney disease, stage 4 (severe): Secondary | ICD-10-CM | POA: Diagnosis not present

## 2018-07-08 LAB — BASIC METABOLIC PANEL
BUN/Creatinine Ratio: 19 (ref 12–28)
BUN: 38 mg/dL — ABNORMAL HIGH (ref 8–27)
CO2: 20 mmol/L (ref 20–29)
Calcium: 9.1 mg/dL (ref 8.7–10.3)
Chloride: 103 mmol/L (ref 96–106)
Creatinine, Ser: 1.98 mg/dL — ABNORMAL HIGH (ref 0.57–1.00)
GFR calc Af Amer: 27 mL/min/{1.73_m2} — ABNORMAL LOW (ref >59)
GFR calc non Af Amer: 23 mL/min/{1.73_m2} — ABNORMAL LOW (ref >59)
Glucose: 141 mg/dL — ABNORMAL HIGH (ref 65–99)
Potassium: 3.8 mmol/L (ref 3.5–5.2)
Sodium: 141 mmol/L (ref 134–144)

## 2018-07-13 ENCOUNTER — Telehealth: Payer: Self-pay | Admitting: Interventional Cardiology

## 2018-07-13 NOTE — Telephone Encounter (Signed)
Attempted to call pt back.  Mailbox is full.  Unable to leave message.

## 2018-07-13 NOTE — Telephone Encounter (Signed)
New Message   Patient returning call about lab results

## 2018-07-16 NOTE — Telephone Encounter (Signed)
Attempted to contact pt.  VM is full.

## 2018-07-19 ENCOUNTER — Ambulatory Visit: Payer: Medicare Other | Admitting: Endocrinology

## 2018-07-19 ENCOUNTER — Telehealth: Payer: Self-pay | Admitting: Interventional Cardiology

## 2018-07-19 NOTE — Telephone Encounter (Signed)
Spoke with patient regarding SOB with activity.  She reports having this feeling occassionally for the last couple of weeks.  She reports a productive cough that is blood tinged when she first starts coughing then mucous is clear after that.  Her SOB, when she does have it resolves with rest after approximately 5 minutes.  Her PCP, Dr Radene Ou has been treating her cough and initially pt was taking amoxicillin and has now been on Doxycycline since Thursday.  Pt denies any edema, no weight gain and no chest pain.  Advised to follow up with Dr Radene Ou.  Pt states understanding and will call her office for f/u.  Pt will c/b if she develops overnight weight gain/edema or other cardiac s/s. I will forward this information to Dr Tamala Julian and his nurse for their knowledge.

## 2018-07-19 NOTE — Telephone Encounter (Signed)
° ° °  Pt c/o Shortness Of Breath: STAT if SOB developed within the last 24 hours or pt is noticeably SOB on the phone  1. Are you currently SOB (can you hear that pt is SOB on the phone)? NO 2. How long have you been experiencing SOB? DAYS  3. Are you SOB when sitting or when up moving around? MOVING AROUND, WALKING  4. Are you currently experiencing any other symptoms? NO

## 2018-07-20 ENCOUNTER — Ambulatory Visit: Payer: Medicare Other | Admitting: Endocrinology

## 2018-07-20 NOTE — Telephone Encounter (Signed)
Agree 

## 2018-07-23 ENCOUNTER — Ambulatory Visit: Payer: Medicare Other | Admitting: Podiatry

## 2018-08-13 ENCOUNTER — Emergency Department (HOSPITAL_COMMUNITY)
Admission: EM | Admit: 2018-08-13 | Discharge: 2018-08-13 | Disposition: A | Discharge status: Home / Self Care | Payer: Medicare Other | Attending: Emergency Medicine | Admitting: Emergency Medicine

## 2018-08-13 ENCOUNTER — Other Ambulatory Visit: Payer: Self-pay

## 2018-08-13 ENCOUNTER — Telehealth: Payer: Self-pay

## 2018-08-13 ENCOUNTER — Telehealth: Payer: Self-pay | Admitting: Endocrinology

## 2018-08-13 ENCOUNTER — Emergency Department (HOSPITAL_COMMUNITY): Payer: Medicare Other

## 2018-08-13 ENCOUNTER — Telehealth: Payer: Self-pay | Admitting: Interventional Cardiology

## 2018-08-13 ENCOUNTER — Encounter (HOSPITAL_COMMUNITY): Payer: Self-pay | Admitting: Emergency Medicine

## 2018-08-13 DIAGNOSIS — Z20828 Contact with and (suspected) exposure to other viral communicable diseases: Secondary | ICD-10-CM | POA: Diagnosis not present

## 2018-08-13 DIAGNOSIS — I509 Heart failure, unspecified: Secondary | ICD-10-CM | POA: Diagnosis not present

## 2018-08-13 DIAGNOSIS — N184 Chronic kidney disease, stage 4 (severe): Secondary | ICD-10-CM | POA: Insufficient documentation

## 2018-08-13 DIAGNOSIS — I129 Hypertensive chronic kidney disease with stage 1 through stage 4 chronic kidney disease, or unspecified chronic kidney disease: Secondary | ICD-10-CM | POA: Insufficient documentation

## 2018-08-13 DIAGNOSIS — Z794 Long term (current) use of insulin: Secondary | ICD-10-CM | POA: Insufficient documentation

## 2018-08-13 DIAGNOSIS — Z87891 Personal history of nicotine dependence: Secondary | ICD-10-CM | POA: Insufficient documentation

## 2018-08-13 DIAGNOSIS — Z7901 Long term (current) use of anticoagulants: Secondary | ICD-10-CM | POA: Insufficient documentation

## 2018-08-13 DIAGNOSIS — I5032 Chronic diastolic (congestive) heart failure: Secondary | ICD-10-CM

## 2018-08-13 DIAGNOSIS — R042 Hemoptysis: Secondary | ICD-10-CM | POA: Diagnosis present

## 2018-08-13 DIAGNOSIS — Z79899 Other long term (current) drug therapy: Secondary | ICD-10-CM | POA: Insufficient documentation

## 2018-08-13 DIAGNOSIS — I110 Hypertensive heart disease with heart failure: Secondary | ICD-10-CM | POA: Diagnosis not present

## 2018-08-13 DIAGNOSIS — R05 Cough: Secondary | ICD-10-CM | POA: Diagnosis not present

## 2018-08-13 LAB — COMPREHENSIVE METABOLIC PANEL
ALT: 23 U/L (ref 0–44)
AST: 27 U/L (ref 15–41)
Albumin: 3.5 g/dL (ref 3.5–5.0)
Alkaline Phosphatase: 41 U/L (ref 38–126)
Anion gap: 10 (ref 5–15)
BUN: 43 mg/dL — ABNORMAL HIGH (ref 8–23)
CO2: 22 mmol/L (ref 22–32)
Calcium: 9 mg/dL (ref 8.9–10.3)
Chloride: 108 mmol/L (ref 98–111)
Creatinine, Ser: 1.93 mg/dL — ABNORMAL HIGH (ref 0.44–1.00)
GFR calc Af Amer: 27 mL/min — ABNORMAL LOW (ref >60)
GFR calc non Af Amer: 24 mL/min — ABNORMAL LOW (ref >60)
Glucose, Bld: 134 mg/dL — ABNORMAL HIGH (ref 70–99)
Potassium: 3.4 mmol/L — ABNORMAL LOW (ref 3.5–5.1)
Sodium: 140 mmol/L (ref 135–145)
Total Bilirubin: 0.6 mg/dL (ref 0.3–1.2)
Total Protein: 6.9 g/dL (ref 6.5–8.1)

## 2018-08-13 LAB — CBC WITH DIFFERENTIAL/PLATELET
Abs Immature Granulocytes: 0.01 10*3/uL (ref 0.00–0.07)
Basophils Absolute: 0 10*3/uL (ref 0.0–0.1)
Basophils Relative: 0 %
Eosinophils Absolute: 0.2 10*3/uL (ref 0.0–0.5)
Eosinophils Relative: 4 %
HCT: 40.1 % (ref 36.0–46.0)
Hemoglobin: 12.6 g/dL (ref 12.0–15.0)
Immature Granulocytes: 0 %
Lymphocytes Relative: 18 %
Lymphs Abs: 1.1 10*3/uL (ref 0.7–4.0)
MCH: 26.9 pg (ref 26.0–34.0)
MCHC: 31.4 g/dL (ref 30.0–36.0)
MCV: 85.7 fL (ref 80.0–100.0)
Monocytes Absolute: 0.7 10*3/uL (ref 0.1–1.0)
Monocytes Relative: 12 %
Neutro Abs: 3.8 10*3/uL (ref 1.7–7.7)
Neutrophils Relative %: 66 %
Platelets: 193 10*3/uL (ref 150–400)
RBC: 4.68 MIL/uL (ref 3.87–5.11)
RDW: 14.8 % (ref 11.5–15.5)
WBC: 5.9 10*3/uL (ref 4.0–10.5)
nRBC: 0 % (ref 0.0–0.2)

## 2018-08-13 LAB — BRAIN NATRIURETIC PEPTIDE: B Natriuretic Peptide: 1157.9 pg/mL — ABNORMAL HIGH (ref 0.0–100.0)

## 2018-08-13 LAB — TROPONIN I: Troponin I: <0.03 ng/mL (ref <0.03)

## 2018-08-13 LAB — PROTIME-INR
INR: 2.8 — ABNORMAL HIGH (ref 0.8–1.2)
Prothrombin Time: 28.7 seconds — ABNORMAL HIGH (ref 11.4–15.2)

## 2018-08-13 NOTE — ED Triage Notes (Signed)
Pt c/o cough that is productive and phlegm will have darker blood in it at times. Pt reports she has been on medications for URI and doctor wanted patient to come to [facility] to get chest xray.

## 2018-08-13 NOTE — Telephone Encounter (Signed)
ATC to reschedule appt for Dr Dwyane Dee - No Show/cancellation list. Voicemail full - unable to leave voicemail

## 2018-08-13 NOTE — Telephone Encounter (Signed)
Unable to lmom for prescreen the mailbox is full

## 2018-08-13 NOTE — ED Provider Notes (Addendum)
[name] DEPT Provider Note   CSN: [id number] Arrival date & time: 08/13/18  1004    History   Chief Complaint Chief Complaint  Patient presents with   Cough   Hemoptysis    HPI [name] is a 85 y.o. female.     Patient is an 85 year old female who lives at home with her family and uses a walker to get around with a history of chronic A. fib on Coumadin, diastolic heart failure, chronic kidney disease, diabetes, hypertension and prior peptic esophagitis presenting today with cough and intermittent hemoptysis for the last 3 weeks.  Patient states that she will have 3-4 episodes of coughing up dark blood a day but all the other times she coughs her sputum is clear.  She saw her doctor about this approximately 3 weeks ago and was given 2 rounds of antibiotics that she finished approximately a week or more ago but her symptoms have not improved.  She does not have resting shortness of breath but does note when she gets up to do things around her house she has to sit and rest more often.  She has occasionally had some chest fullness but no pain.  She has had no fever or nasal congestion.  She has had no abdominal pain, nausea or vomiting.  She occasionally will notice dark stools but also states at times they are normal color as well.  She has not noticed any worsening leg swelling but does not check her weights regularly.  She is still been eating a normal diet but states she generally just feels yucky.  Other than the recent antibiotics she has had no medication changes.  She does note that she takes colchicine twice a day for the last year for her gout.  The history is provided by the patient.  Cough  Cough characteristics:  Productive Sputum characteristics:  Clear and bloody Severity:  Moderate Onset quality:  Gradual Duration:  3 weeks Timing:  Intermittent Progression:  Unchanged Chronicity:  New Smoker: no   Context: not sick contacts and  not upper respiratory infection   Relieved by:  Nothing Worsened by:  Nothing Ineffective treatments: tried otc med and 2 rounds of abx by her pcp. Associated symptoms: shortness of breath   Associated symptoms: no chest pain, no chills, no fever, no rhinorrhea, no sinus congestion, no weight loss and no wheezing   Associated symptoms comment:  Over last 3 weeks has just felt "cruddy" and been less active.  Does have to stop more when doing things around the house.   Past Medical History:  Diagnosis Date   Anemia due to blood loss, chronic 08/23/2013   Atrial fibrillation ([place]) 12/09/2012   Candidal skin infection 12/23/2012   Chronic diastolic heart failure ([place]) 08/23/2013   Chronic kidney disease, stage IV (severe) ([place]) 12/09/2012   Diabetes mellitus with renal complications ([place]) 88/06/1658   Diabetes mellitus without complication ([place])    Dyslipidemia 12/09/2012   Fall at home Sept. 3, 2016   Fx  3 ribs   GERD (gastroesophageal reflux disease) 12/23/2012   HTN (hypertension) 12/09/2012   Hyperlipidemia 12/23/2012   Hypertension    Ileus, postoperative ([place]) 12/17/2012   Intractable pain 12/03/2014   Irregular heart beat    Long term current use of anticoagulant therapy 02/22/2013   On amiodarone therapy 02/22/2013   Peptic esophagitis 08/23/2013   Pressure injury of skin 05/21/2016   Renal disorder    Type II diabetes mellitus, uncontrolled (  [place]) 05/07/863   Umbilical hernia, incarcerated - reducible 12/10/2012    Patient Active Problem List   Diagnosis Date Noted   Pressure injury of skin 05/21/2016   Cellulitis of great toe of right foot 05/15/2016   Intractable pain 12/03/2014   Type II diabetes mellitus, uncontrolled ([place]) 09/06/2013   Chronic diastolic heart failure ([place]) 08/23/2013   Anemia due to blood loss, chronic 08/23/2013   Peptic esophagitis 08/23/2013   Encounter for therapeutic drug monitoring 04/29/2013   On amiodarone therapy  02/22/2013   Long term current use of anticoagulant therapy 02/22/2013   Candidal skin infection 12/23/2012   Hyperlipidemia 12/23/2012   GERD (gastroesophageal reflux disease) 12/23/2012   Diabetes mellitus with renal complications ([place]) 78/46/9629   Umbilical hernia, incarcerated - reducible 12/10/2012   HTN (hypertension) 12/09/2012   Dyslipidemia 12/09/2012   Atrial fibrillation ([place]) 12/09/2012   Chronic kidney disease, stage IV (severe) ([place]) 12/09/2012    Past Surgical History:  Procedure Laterality Date   BREAST LUMPECTOMY Right 2001   2 lumpectomies with radiation both 2001   CATARACT EXTRACTION, BILATERAL     HERNIA REPAIR     I&D EXTREMITY Right 05/19/2016   Procedure: MINOR IRRIGATION AND DEBRIDEMENT Right great toe;  Surgeon: Gaynelle Arabian, MD;  Location: [facility];  Service: Orthopedics;  Laterality: Right;   INSERTION OF MESH N/A 12/12/2012   Procedure: INSERTION OF MESH;  Surgeon: Madilyn Hook, DO;  Location: [facility];  Service: General;  Laterality: N/A;   LAPAROSCOPIC LYSIS OF ADHESIONS N/A 12/12/2012   Procedure: LAPAROSCOPIC LYSIS OF ADHESIONS;  Surgeon: Madilyn Hook, DO;  Location: [facility];  Service: General;  Laterality: N/A;   Right Lumpectomy     VENTRAL HERNIA REPAIR N/A 12/12/2012   Procedure: LAPAROSCOPIC VENTRAL HERNIA;  Surgeon: Madilyn Hook, DO;  Location: [facility];  Service: General;  Laterality: N/A;     OB History   No obstetric history on file.      Home Medications    Prior to Admission medications   Medication Sig Start Date End Date Taking? Authorizing Provider  acidophilus (RISAQUAD) CAPS capsule Take 1 capsule by mouth daily as needed (GI issues).     [provider]  atorvastatin (LIPITOR) 40 MG tablet Take 40 mg by mouth every evening.     [provider]  B-D UF III MINI PEN NEEDLES 31G X 5 MM MISC USE 3 PEN NEEDLES PER DAY 06/10/16   Elayne Snare, MD  colchicine 0.6 MG tablet Take 0.6 mg by mouth daily.      [provider]  Cranberry 250 MG CAPS Take 750 mg by mouth daily.     [provider]  diltiazem (CARDIZEM CD) 180 MG 24 hr capsule Take 1 capsule (180 mg total) by mouth daily. 05/11/18 08/09/18  Belva Crome, MD  econazole nitrate 1 % cream Apply 1 application topically daily as needed (to groin rash).     [provider]  esomeprazole (NEXIUM) 20 MG capsule Take 20 mg by mouth daily before breakfast.     [provider]  ferrous sulfate 325 (65 FE) MG tablet Take 325 mg by mouth daily with breakfast.    [provider]  fluticasone (FLONASE) 50 MCG/ACT nasal spray Place 2 sprays into both nostrils daily as needed for rhinitis.     [provider]  furosemide (LASIX) 80 MG tablet Take one tablet by mouth every morning.  Take 1/2 tablet (40mg ) by mouth every evening. 05/27/18  Belva Crome, MD  hydrALAZINE (APRESOLINE) 25 MG tablet Take 3 tablets (75 mg total) by mouth 3 (three) times daily. 02/09/18   Belva Crome, MD  insulin lispro (HUMALOG KWIKPEN) 100 UNIT/ML KiwkPen Inject 7 units before breakfast 04/05/17   Elayne Snare, MD  metolazone (ZAROXOLYN) 5 MG tablet Take 5 mg by mouth daily as needed (for edema).     [provider]  metoprolol tartrate (LOPRESSOR) 100 MG tablet TAKE 1 TABLET BY MOUTH TWICE A DAY 06/29/18   Belva Crome, MD  [facility] VERIO test strip USE 1 STRIP TO CHECK GLUCOSE TWICE DAILY AS DIRECTED 10/16/17   Elayne Snare, MD  polyethylene glycol (MIRALAX / GLYCOLAX) packet Take 17 g by mouth daily as needed for mild constipation.    [provider]  potassium chloride (K-DUR) 10 MEQ tablet TAKE 3 TABLETS BY MOUTH EVERY MORNING AND TAKE 3 TABLETS BY MOUTH EVERY EVENING 03/08/18   Belva Crome, MD  VICTOZA 18 MG/3ML SOPN INJECT 1.2MG  SUBCUTANEOUSLY DAILY 01/20/18   Elayne Snare, MD  warfarin (COUMADIN) 5 MG tablet TAKE AS DIRECTED BY COUMADIN CLINIC 06/09/18   Belva Crome, MD    Family History Family  History  Problem Relation Age of Onset   Diabetes Mother    Diabetes Sister     Social History Social History   Tobacco Use   Smoking status: Former Smoker    Types: Cigarettes   Smokeless tobacco: Never Used  Substance Use Topics   Alcohol use: No   Drug use: No     Allergies   Lotensin [benazepril hcl]; Zantac [ranitidine hcl]; and Tape   Review of Systems Review of Systems  Constitutional: Negative for chills, fever and weight loss.  HENT: Negative for rhinorrhea.   Respiratory: Positive for cough and shortness of breath. Negative for wheezing.   Cardiovascular: Negative for chest pain.  All other systems reviewed and are negative.    Physical Exam Updated Vital Signs BP (!) 175/108 (BP Location: Left Arm)    Pulse (!) 105    Temp 98.1 F (36.7 C) (Oral)    Resp 18    SpO2 96%   Physical Exam Vitals signs and nursing note reviewed.  Constitutional:      General: She is not in acute distress.    Appearance: She is well-developed.  HENT:     Head: Normocephalic and atraumatic.     Nose:     Comments: No epistaxis    Mouth/Throat:     Mouth: Mucous membranes are moist.     Pharynx: Oropharynx is clear.  Eyes:     Conjunctiva/sclera: Conjunctivae normal.     Pupils: Pupils are equal, round, and reactive to light.  Neck:     Musculoskeletal: Normal range of motion and neck supple.  Cardiovascular:     Rate and Rhythm: Tachycardia present. Rhythm irregularly irregular.     Heart sounds: No murmur.  Pulmonary:     Effort: Pulmonary effort is normal. No respiratory distress.     Breath sounds: Normal breath sounds. No wheezing or rales.  Abdominal:     General: There is no distension.     Palpations: Abdomen is soft.     Tenderness: There is no abdominal tenderness. There is no guarding or rebound.  Musculoskeletal: Normal range of motion.        General: No tenderness.     Left lower leg: Edema present.     Comments: Skin changes and swelling  over  the left lower ext below the knee.  2+ non-pitting edema.  Pulses intact bilaterally.  NO erythema, wounds or drainage to the lower ext.  Skin:    General: Skin is warm and dry.     Findings: No erythema or rash.  Neurological:     General: No focal deficit present.     Mental Status: She is alert and oriented to person, place, and time. Mental status is at baseline.  Psychiatric:        Mood and Affect: Mood normal.        Behavior: Behavior normal.        Thought Content: Thought content normal.      ED Treatments / Results  Labs (all labs ordered are listed, but only abnormal results are displayed) Labs Reviewed  COMPREHENSIVE METABOLIC PANEL - Abnormal; Notable for the following components:      Result Value   Potassium 3.4 (*)    Glucose, Bld 134 (*)    BUN 43 (*)    Creatinine, Ser 1.93 (*)    GFR calc non Af Amer 24 (*)    GFR calc Af Amer 27 (*)    All other components within normal limits  BRAIN NATRIURETIC PEPTIDE - Abnormal; Notable for the following components:   B Natriuretic Peptide 1,157.9 (*)    All other components within normal limits  PROTIME-INR - Abnormal; Notable for the following components:   Prothrombin Time 28.7 (*)    INR 2.8 (*)    All other components within normal limits  NOVEL CORONAVIRUS, NAA (HOSPITAL ORDER, SEND-OUT TO REF LAB)  CBC WITH DIFFERENTIAL/PLATELET  TROPONIN I    EKG EKG Interpretation  Date/Time:  Friday Aug 13 2018 11:12:57 EDT Ventricular Rate:  105 PR Interval:    QRS Duration: 98 QT Interval:  357 QTC Calculation: 472 R Axis:   -81 Text Interpretation:  chronic Atrial fibrillation LVH with secondary repolarization abnormality Inferior infarct, old Anterior infarct, old Confirmed by Blanchie Dessert [phone]) on 08/13/2018 12:02:13 PM   Radiology Dg Chest 2 View  Result Date: 08/13/2018 CLINICAL DATA:  Productive cough. Recent upper respiratory infection. History of diabetes and hypertension. EXAM: CHEST - 2 VIEW  COMPARISON:  Radiographs 07/23/2016 and 12/04/2014. FINDINGS: Mild cardiomegaly and aortic atherosclerosis. There is a new small right pleural effusion and new right-greater-than-left ground-glass opacities throughout both lungs. The pulmonary vasculature is ill-defined, and these findings are most suggestive edema. There is no confluent airspace opacity or pneumothorax. There are surgical clips in the right axilla. No acute osseous findings. Telemetry leads overlie the chest. IMPRESSION: New patchy right-greater-than-left ground-glass opacities and right pleural effusion, most consistent with pulmonary edema/congestive heart failure versus atypical infection. No consolidation. Electronically Signed   By: Richardean Sale M.D.   On: 08/13/2018 11:45    Procedures Procedures (including critical care time)  Medications Ordered in ED Medications - No data to display   Initial Impression / Assessment and Plan / ED Course  I have reviewed the triage vital signs and the nursing notes.  Pertinent labs & imaging results that were available during my care of the patient were reviewed by me and considered in my medical decision making (see chart for details).        Elderly female with multiple medical problems presenting today with cough for the last 3 weeks with intermittent hemoptysis.  She has been on 2 rounds of antibiotics the last one was completed over a week ago but her symptoms  are not improving.  She is also noticed having to stop and rest more frequently than normal but has had normal appetite and denies any abdominal pain.  She does complain of occasional feeling of fullness in her chest but never any pain.  She is not having any symptoms at this time.  She denies any current shortness of breath at rest.  She denies any recent medication changes except for the antibiotics.  She is still taking Coumadin for chronic atrial fibrillation.  She does state that she has not taken any of her morning  medications because she did not have time to eat breakfast.  Nobody in her home has had infectious symptoms.  She has not noticed any worsening lower extremity edema.  At this time lower suspicion for PE given patient's anticoagulation status but concern for possible acute kidney injury (as pt is on colchicine long term with hx of stage IV), anemia, CHF, infectious etiology however low likelihood given she has not had any fever or other infectious symptoms.  EKG without acute findings.  Chest x-ray, CBC, CMP, PT/INR, BNP and trop pending.  Patient is currently comfortable and satting 100% on room air.  12:43 PM Labs are consistent with CHF exacerbation with BNP of >1,000 and chronic kidney disease that is unchanged from baseline.  Pt has normal Hb without anemia.  CXR with pleural effusions and pulm edema which is most likely the cause of her symptoms.  Low suspicion for COVID at this time.  Patient's Coumadin level is therapeutic at 2.8 low suspicion for PE.  Findings discussed with the patient and her daughter and they both feel that she is stable enough to go home and try increasing diuretics for symptomatic control.  Patient currently takes 80 mg of Lasix in the morning and 40 mg at night.  We will increase this to 80 mg twice daily. Also encouraged to take 1 dose of her Zaroxolyn today. she has follow-up with Dr. Radene Ou her PCP and discussed with her whether the patient should be on colchicine chronically given her kidney disease and also spoke with Dr. Thompson Caul office her cardiologist for close follow-up.  Discussed with the patient and her daughter if her symptoms worsen that she should return to the ER or see her doctor.   [name] was evaluated in Emergency Department on 08/13/2018 for the symptoms described in the history of present illness. She was evaluated in the context of the global COVID-19 pandemic, which necessitated consideration that the patient might be at risk for infection with the  SARS-CoV-2 virus that causes COVID-19. Institutional protocols and algorithms that pertain to the evaluation of patients at risk for COVID-19 are in a state of rapid change based on information released by regulatory bodies including the CDC and federal and state organizations. These policies and algorithms were followed during the patient's care in the ED.   Final Clinical Impressions(s) / ED Diagnoses   Final diagnoses:  Acute on chronic congestive heart failure, unspecified heart failure type [facility])    ED Discharge Orders    None       Blanchie Dessert, MD 08/13/18 1259    Blanchie Dessert, MD 08/13/18 1259

## 2018-08-13 NOTE — Telephone Encounter (Signed)
Spoke with Dr. Maryan Rued at [facility].  Pt came into ED to r/o PNA.  Dr. Maryan Rued calling to make [place] aware no PNA but does have CHF. Pt currently takes Furosemide 80AM, 40PM. Pt noted to have elevated BNP, pleural effusion and pulmonary edema.  Pt wants to go home.  Dr. Maryan Rued plans to send pt home with Furosemide 80mg  BID.  Pt currently scheduled to see Dr. Tamala Julian in office on 6/11 when he is DOD.  Advised I will send message to Dr. Tamala Julian for review and I will call the pt if any further recommendations prior to appt, otherwise, follow plan outlined at ED and we will see her as planned.

## 2018-08-13 NOTE — Discharge Instructions (Signed)
Today you had blood work and x-ray and an EKG done.  Your x-ray showed signs of fluid in your lungs but there is no sign of infection today, anemia and your kidneys appear to be doing about the same as usual.  I spoke with Dr. Dahlia Bailiff office and Dr. Thompson Caul office and they are aware of your visit today.  I want you to increase your Lasix to 80 mg twice a day and also take 1 dose of your Zaroxolyn (metolazone) 5 mg today.  Do not take any more of this medication unless instructed to do so by Dr. Tamala Julian.  If your symptoms continue to worsen and not improve you should either go to your doctor or return to the emergency room.

## 2018-08-14 LAB — NOVEL CORONAVIRUS, NAA (HOSP ORDER, SEND-OUT TO REF LAB; TAT 18-24 HRS): SARS-CoV-2, NAA: NOT DETECTED

## 2018-08-14 NOTE — Telephone Encounter (Signed)
I looked at the CXR and agree needs more diuretic. Recommend Furosemide 120 mg PO BIS gor 3 days the bak to 80 mg BID. Then get BMET 1 week after the dose increase starting with the ER manipulation

## 2018-08-17 ENCOUNTER — Ambulatory Visit (INDEPENDENT_AMBULATORY_CARE_PROVIDER_SITE_OTHER): Payer: Medicare Other | Admitting: Pharmacist

## 2018-08-17 ENCOUNTER — Other Ambulatory Visit: Payer: Self-pay

## 2018-08-17 DIAGNOSIS — Z5181 Encounter for therapeutic drug level monitoring: Secondary | ICD-10-CM | POA: Diagnosis not present

## 2018-08-17 DIAGNOSIS — I4891 Unspecified atrial fibrillation: Secondary | ICD-10-CM

## 2018-08-17 LAB — POCT INR: INR: 1.9 — AB (ref 2.0–3.0)

## 2018-08-17 MED ORDER — FUROSEMIDE 80 MG PO TABS: 80.0000 mg | ORAL_TABLET | Freq: Two times a day (BID) | ORAL | 3 refills | Status: DC

## 2018-08-17 NOTE — Telephone Encounter (Signed)
Spoke with pt and went over recommendations.  Pt verbalized understanding and was in agreement with this plan.  

## 2018-08-20 ENCOUNTER — Other Ambulatory Visit: Payer: Self-pay

## 2018-08-20 ENCOUNTER — Other Ambulatory Visit: Payer: Medicare Other | Admitting: *Deleted

## 2018-08-20 ENCOUNTER — Telehealth: Payer: Self-pay

## 2018-08-20 DIAGNOSIS — I5032 Chronic diastolic (congestive) heart failure: Secondary | ICD-10-CM | POA: Diagnosis not present

## 2018-08-20 NOTE — Telephone Encounter (Signed)
Canceled appt 07/20/18. Called to reschedule appt. Unable to reach d/t VM being full

## 2018-08-21 LAB — BASIC METABOLIC PANEL
BUN/Creatinine Ratio: 27 (ref 12–28)
BUN: 51 mg/dL — ABNORMAL HIGH (ref 8–27)
CO2: 23 mmol/L (ref 20–29)
Calcium: 9.8 mg/dL (ref 8.7–10.3)
Chloride: 102 mmol/L (ref 96–106)
Creatinine, Ser: 1.92 mg/dL — ABNORMAL HIGH (ref 0.57–1.00)
GFR calc Af Amer: 28 mL/min/{1.73_m2} — ABNORMAL LOW (ref >59)
GFR calc non Af Amer: 24 mL/min/{1.73_m2} — ABNORMAL LOW (ref >59)
Glucose: 115 mg/dL — ABNORMAL HIGH (ref 65–99)
Potassium: 3.3 mmol/L — ABNORMAL LOW (ref 3.5–5.2)
Sodium: 142 mmol/L (ref 134–144)

## 2018-08-31 ENCOUNTER — Telehealth: Payer: Self-pay

## 2018-08-31 NOTE — Telephone Encounter (Signed)

## 2018-09-01 NOTE — Telephone Encounter (Signed)
This encounter was created in error - please disregard.

## 2018-09-02 ENCOUNTER — Other Ambulatory Visit: Payer: Self-pay

## 2018-09-02 ENCOUNTER — Encounter: Payer: Self-pay | Admitting: Interventional Cardiology

## 2018-09-02 ENCOUNTER — Ambulatory Visit (INDEPENDENT_AMBULATORY_CARE_PROVIDER_SITE_OTHER): Payer: Medicare Other | Admitting: Interventional Cardiology

## 2018-09-02 VITALS — BP 136/72 | HR 110 | Ht 64.0 in | Wt 159.4 lb

## 2018-09-02 DIAGNOSIS — Z7901 Long term (current) use of anticoagulants: Secondary | ICD-10-CM

## 2018-09-02 DIAGNOSIS — I5032 Chronic diastolic (congestive) heart failure: Secondary | ICD-10-CM | POA: Diagnosis not present

## 2018-09-02 DIAGNOSIS — I4811 Longstanding persistent atrial fibrillation: Secondary | ICD-10-CM

## 2018-09-02 DIAGNOSIS — Z79899 Other long term (current) drug therapy: Secondary | ICD-10-CM | POA: Diagnosis not present

## 2018-09-02 DIAGNOSIS — N184 Chronic kidney disease, stage 4 (severe): Secondary | ICD-10-CM | POA: Diagnosis not present

## 2018-09-02 DIAGNOSIS — I10 Essential (primary) hypertension: Secondary | ICD-10-CM

## 2018-09-02 MED ORDER — FUROSEMIDE 80 MG PO TABS: ORAL_TABLET | ORAL | 3 refills | Status: DC

## 2018-09-02 NOTE — Progress Notes (Signed)
Cardiology Office Note:    Date:  09/02/2018   ID:  PYPER OLEXA, DOB 08/19/1933, MRN [id number]  PCP:  Aretta Nip, MD  Cardiologist:  Sinclair Grooms, MD   Referring MD: Aretta Nip, MD   Chief Complaint  Patient presents with  . Congestive Heart Failure  . Atrial Fibrillation    History of Present Illness:    [name] is a 85 y.o. female with a hx of with a hx of hx of paroxysmal atrial fib, amiodarone therapy, chronic diastolic heart failure, elderly and frail.  [name] is now feeling better.  Lower extremity edema has resolved significantly.  Shortness of breath has improved.  Her appetite is poor but is been that way for quite some time.  She is able to lie in bed without difficulty.  We had a several day management with furosemide 120 mg twice a day that led to significant diuresis.  Prior to that she was on 80 mg twice a day.  Past Medical History:  Diagnosis Date  . Anemia due to blood loss, chronic 08/23/2013  . Atrial fibrillation ([place]) 12/09/2012  . Candidal skin infection 12/23/2012  . Chronic diastolic heart failure ([place]) 08/23/2013  . Chronic kidney disease, stage IV (severe) ([place]) 12/09/2012  . Diabetes mellitus with renal complications ([place]) 62/11/5282  . Diabetes mellitus without complication ([place])   . Dyslipidemia 12/09/2012  . Fall at home Sept. 3, 2016   Fx  3 ribs  . GERD (gastroesophageal reflux disease) 12/23/2012  . HTN (hypertension) 12/09/2012  . Hyperlipidemia 12/23/2012  . Hypertension   . Ileus, postoperative ([place]) 12/17/2012  . Intractable pain 12/03/2014  . Irregular heart beat   . Long term current use of anticoagulant therapy 02/22/2013  . On amiodarone therapy 02/22/2013  . Peptic esophagitis 08/23/2013  . Pressure injury of skin 05/21/2016  . Renal disorder   . Type II diabetes mellitus, uncontrolled ([place]) 09/06/2013  . Umbilical hernia, incarcerated - reducible 12/10/2012    Past Surgical History:  Procedure Laterality  Date  . BREAST LUMPECTOMY Right 2001   2 lumpectomies with radiation both 2001  . CATARACT EXTRACTION, BILATERAL    . HERNIA REPAIR    . I&D EXTREMITY Right 05/19/2016   Procedure: MINOR IRRIGATION AND DEBRIDEMENT Right great toe;  Surgeon: Gaynelle Arabian, MD;  Location: [facility];  Service: Orthopedics;  Laterality: Right;  . INSERTION OF MESH N/A 12/12/2012   Procedure: INSERTION OF MESH;  Surgeon: Madilyn Hook, DO;  Location: [facility];  Service: General;  Laterality: N/A;  . LAPAROSCOPIC LYSIS OF ADHESIONS N/A 12/12/2012   Procedure: LAPAROSCOPIC LYSIS OF ADHESIONS;  Surgeon: Madilyn Hook, DO;  Location: [facility];  Service: General;  Laterality: N/A;  . Right Lumpectomy    . VENTRAL HERNIA REPAIR N/A 12/12/2012   Procedure: LAPAROSCOPIC VENTRAL HERNIA;  Surgeon: Madilyn Hook, DO;  Location: [facility];  Service: General;  Laterality: N/A;    Current Medications: Current Meds  Medication Sig  . acidophilus (RISAQUAD) CAPS capsule Take 1 capsule by mouth daily as needed (GI issues).   Marland Kitchen atorvastatin (LIPITOR) 40 MG tablet Take 40 mg by mouth every evening.   . B-D UF III MINI PEN NEEDLES 31G X 5 MM MISC USE 3 PEN NEEDLES PER DAY  . colchicine 0.6 MG tablet Take 0.6 mg by mouth daily.   . Cranberry 250 MG CAPS Take 750 mg by mouth daily.   Marland Kitchen diltiazem (CARDIZEM CD) 180 MG 24 hr  capsule Take 1 capsule (180 mg total) by mouth daily.  Marland Kitchen econazole nitrate 1 % cream Apply 1 application topically daily as needed (to groin rash).   Marland Kitchen esomeprazole (NEXIUM) 20 MG capsule Take 20 mg by mouth daily before breakfast.   . ferrous sulfate 325 (65 FE) MG tablet Take 325 mg by mouth daily with breakfast.  . fluticasone (FLONASE) 50 MCG/ACT nasal spray Place 2 sprays into both nostrils daily as needed for rhinitis.   . furosemide (LASIX) 80 MG tablet Take 1 tablet (80 mg total) by mouth 2 (two) times daily.  . hydrALAZINE (APRESOLINE) 25 MG tablet Take 3 tablets (75 mg total) by mouth 3 (three) times daily.  . insulin  lispro (HUMALOG KWIKPEN) 100 UNIT/ML KiwkPen Inject 7 units before breakfast  . metolazone (ZAROXOLYN) 5 MG tablet Take 5 mg by mouth daily as needed (for edema).   . metoprolol tartrate (LOPRESSOR) 100 MG tablet TAKE 1 TABLET BY MOUTH TWICE A DAY  . ONETOUCH VERIO test strip USE 1 STRIP TO CHECK GLUCOSE TWICE DAILY AS DIRECTED  . polyethylene glycol (MIRALAX / GLYCOLAX) packet Take 17 g by mouth daily as needed for mild constipation.  . potassium chloride (K-DUR) 10 MEQ tablet TAKE 3 TABLETS BY MOUTH EVERY MORNING AND TAKE 3 TABLETS BY MOUTH EVERY EVENING  . VICTOZA 18 MG/3ML SOPN INJECT 1.2MG  SUBCUTANEOUSLY DAILY  . warfarin (COUMADIN) 5 MG tablet TAKE AS DIRECTED BY COUMADIN CLINIC     Allergies:   Lotensin [benazepril hcl], Zantac [ranitidine hcl], and Tape   Social History   Socioeconomic History  . Marital status: Married    Spouse name: Not on file  . Number of children: Not on file  . Years of education: Not on file  . Highest education level: Not on file  Occupational History  . Not on file  Social Needs  . Financial resource strain: Not on file  . Food insecurity    Worry: Not on file    Inability: Not on file  . Transportation needs    Medical: Not on file    Non-medical: Not on file  Tobacco Use  . Smoking status: Former Smoker    Types: Cigarettes  . Smokeless tobacco: Never Used  Substance and Sexual Activity  . Alcohol use: No  . Drug use: No  . Sexual activity: Never  Lifestyle  . Physical activity    Days per week: Not on file    Minutes per session: Not on file  . Stress: Not on file  Relationships  . Social Herbalist on phone: Not on file    Gets together: Not on file    Attends religious service: Not on file    Active member of club or organization: Not on file    Attends meetings of clubs or organizations: Not on file    Relationship status: Not on file  Other Topics Concern  . Not on file  Social History Narrative  . Not on file      Family History: The patient's family history includes Diabetes in her mother and sister.  ROS:   Please see the history of present illness.    She has been sad because her husband has dementia.  He is now on hospice and palliative care.  Her appetite is been poor.  All other systems reviewed and are negative.  EKGs/Labs/Other Studies Reviewed:    The following studies were reviewed today: Recent chest x-ray demonstrated right pleural effusion  with patchy infiltrates consistent with pneumonia or CHF.  EKG:  EKG the electrocardiogram performed on 08/16/2018 demonstrated relatively rapid ventricular response at 105, poor R wave progression, prominent voltage, and when compared to prior tracings, no significant change other than faster heart rate.  Recent Labs: 11/26/2017: TSH 1.930 05/11/2018: NT-Pro BNP 19,800 08/13/2018: ALT 23; B Natriuretic Peptide 1,157.9; Hemoglobin 12.6; Platelets 193 08/20/2018: BUN 51; Creatinine, Ser 1.92; Potassium 3.3; Sodium 142  Recent Lipid Panel    Component Value Date/Time   CHOL 112 07/18/2016 1005   TRIG 111.0 07/18/2016 1005   HDL 53.70 07/18/2016 1005   CHOLHDL 2 07/18/2016 1005   VLDL 22.2 07/18/2016 1005   LDLCALC 36 07/18/2016 1005   LDLDIRECT 37.8 10/27/2013 0808    Physical Exam:    VS:  BP 136/72   Pulse (!) 110   Ht 5\' 4"  (1.626 m)   Wt 159 lb 6.4 oz (72.3 kg)   SpO2 98%   BMI 27.36 kg/m     Wt Readings from Last 3 Encounters:  09/02/18 159 lb 6.4 oz (72.3 kg)  05/18/18 177 lb 12.8 oz (80.6 kg)  05/11/18 177 lb (80.3 kg)     GEN: Chronically ill-appearing. No acute distress HEENT: Normal NECK: No JVD. LYMPHATICS: No lymphadenopathy CARDIAC: IIRR.  2/6 systolic apical murmur, no gallop, 1-2+ left ankle and 0-1+ right edema VASCULAR: 2+ bilateral radial pulses, no bruits RESPIRATORY:  Clear to auscultation without rales, wheezing or rhonchi  ABDOMEN: Soft, non-tender, non-distended, No pulsatile mass, MUSCULOSKELETAL: No  deformity  SKIN: Warm and dry NEUROLOGIC:  Alert and oriented x 3 PSYCHIATRIC:  Normal affect   ASSESSMENT:    1. Longstanding persistent atrial fibrillation   2. Chronic diastolic heart failure ([place])   3. Chronic kidney disease, stage IV (severe) ([place])   4. On amiodarone therapy   5. Long term current use of anticoagulant therapy   6. Essential hypertension    PLAN:    In order of problems listed above:  1. Heart rate is better controlled today.  She is in chronic atrial fibrillation and no additional changes are needed.  Amiodarone is been discontinued. 2. Proved with more aggressive diuresis.  Surprisingly her weight is down under 160. 3. Stage III/IV CKD.  Basic metabolic panel will be obtained in 1 month and again in 6 months.  TSH will be done at that time as well 4. No longer on amiodarone therapy. 5. Continue anticoagulation for stroke prophylaxis. 6. Excellent blood pressure control.    Medication Adjustments/Labs and Tests Ordered: Current medicines are reviewed at length with the patient today.  Concerns regarding medicines are outlined above.  No orders of the defined types were placed in this encounter.  No orders of the defined types were placed in this encounter.   There are no Patient Instructions on file for this visit.   Signed, Sinclair Grooms, MD  09/02/2018 9:59 AM    [place]

## 2018-09-02 NOTE — Patient Instructions (Signed)
Medication Instructions:  1) CHANGE Furosemide to 120mg  every morning (1.5 tablets of your 80mg  dose) and 80mg  every evening  If you need a refill on your cardiac medications before your next appointment, please call your pharmacy.   Lab work: Your physician recommends that you return for lab work in: 1 monht [employer])  Your physician recommends that you return for lab work in: November (BMET, Liver, TSH)   If you have labs (blood work) drawn today and your tests are completely normal, you will receive your results only by: Marland Kitchen MyChart Message (if you have MyChart) OR . A paper copy in the mail If you have any lab test that is abnormal or we need to change your treatment, we will call you to review the results.  Testing/Procedures: None  Follow-Up: At [facility], you and your health needs are our priority.  As part of our continuing mission to provide you with exceptional heart care, we have created designated Provider Care Teams.  These Care Teams include your primary Cardiologist (physician) and Advanced Practice Providers (APPs -  Physician Assistants and Nurse Practitioners) who all work together to provide you with the care you need, when you need it. You will need a follow up appointment in 6 months.  Please call our office 2 months in advance to schedule this appointment.  You may see Sinclair Grooms, MD or one of the following Advanced Practice Providers on your designated Care Team:   Truitt Merle, NP Cecilie Kicks, NP . Kathyrn Drown, NP  Any Other Special Instructions Will Be Listed Below (If Applicable).  Your physician recommends that you weigh, daily, at the same time every day, and in the same amount of clothing. Contact the office if you gain 3lbs overnight or 5lbs in a week.  Also let us know if your weight is dropping very fast.

## 2018-09-07 ENCOUNTER — Ambulatory Visit (INDEPENDENT_AMBULATORY_CARE_PROVIDER_SITE_OTHER): Payer: Medicare Other | Admitting: Pharmacist

## 2018-09-07 ENCOUNTER — Other Ambulatory Visit: Payer: Self-pay

## 2018-09-07 DIAGNOSIS — Z5181 Encounter for therapeutic drug level monitoring: Secondary | ICD-10-CM | POA: Diagnosis not present

## 2018-09-07 DIAGNOSIS — I4891 Unspecified atrial fibrillation: Secondary | ICD-10-CM | POA: Diagnosis not present

## 2018-09-07 DIAGNOSIS — I4811 Longstanding persistent atrial fibrillation: Secondary | ICD-10-CM | POA: Diagnosis not present

## 2018-09-07 LAB — POCT INR: INR: 3.3 — AB (ref 2.0–3.0)

## 2018-09-07 NOTE — Patient Instructions (Signed)
Description   Skip Coumadin today, then continue taking 1 tablet daily except 1.5 tablets on Wednesdays and Fridays. Recheck INR in 4 weeks (same day as labs). Cou[facility]in Clinic [phone]

## 2018-09-11 ENCOUNTER — Other Ambulatory Visit: Payer: Self-pay | Admitting: Interventional Cardiology

## 2018-09-22 ENCOUNTER — Encounter: Payer: Self-pay | Admitting: Podiatry

## 2018-09-22 ENCOUNTER — Other Ambulatory Visit: Payer: Self-pay

## 2018-09-22 ENCOUNTER — Ambulatory Visit (INDEPENDENT_AMBULATORY_CARE_PROVIDER_SITE_OTHER): Payer: Medicare Other | Admitting: Podiatry

## 2018-09-22 VITALS — Temp 98.1°F

## 2018-09-22 DIAGNOSIS — M79675 Pain in left toe(s): Secondary | ICD-10-CM | POA: Diagnosis not present

## 2018-09-22 DIAGNOSIS — B351 Tinea unguium: Secondary | ICD-10-CM

## 2018-09-22 DIAGNOSIS — M79674 Pain in right toe(s): Secondary | ICD-10-CM

## 2018-09-22 DIAGNOSIS — E1151 Type 2 diabetes mellitus with diabetic peripheral angiopathy without gangrene: Secondary | ICD-10-CM

## 2018-09-22 NOTE — Patient Instructions (Signed)
Diabetes Mellitus and Foot Care Foot care is an important part of your health, especially when you have diabetes. Diabetes may cause you to have problems because of poor blood flow (circulation) to your feet and legs, which can cause your skin to:  Become thinner and drier.  Break more easily.  Heal more slowly.  Peel and crack. You may also have nerve damage (neuropathy) in your legs and feet, causing decreased feeling in them. This means that you may not notice minor injuries to your feet that could lead to more serious problems. Noticing and addressing any potential problems early is the best way to prevent future foot problems. How to care for your feet Foot hygiene  Wash your feet daily with warm water and mild soap. Do not use hot water. Then, pat your feet and the areas between your toes until they are completely dry. Do not soak your feet as this can dry your skin.  Trim your toenails straight across. Do not dig under them or around the cuticle. File the edges of your nails with an emery board or nail file.  Apply a moisturizing lotion or petroleum jelly to the skin on your feet and to dry, brittle toenails. Use lotion that does not contain alcohol and is unscented. Do not apply lotion between your toes. Shoes and socks  Wear clean socks or stockings every day. Make sure they are not too tight. Do not wear knee-high stockings since they may decrease blood flow to your legs.  Wear shoes that fit properly and have enough cushioning. Always look in your shoes before you put them on to be sure there are no objects inside.  To break in new shoes, wear them for just a few hours a day. This prevents injuries on your feet. Wounds, scrapes, corns, and calluses  Check your feet daily for blisters, cuts, bruises, sores, and redness. If you cannot see the bottom of your feet, use a mirror or ask someone for help.  Do not cut corns or calluses or try to remove them with medicine.  If you  find a minor scrape, cut, or break in the skin on your feet, keep it and the skin around it clean and dry. You may clean these areas with mild soap and water. Do not clean the area with peroxide, alcohol, or iodine.  If you have a wound, scrape, corn, or callus on your foot, look at it several times a day to make sure it is healing and not infected. Check for: ? Redness, swelling, or pain. ? Fluid or blood. ? Warmth. ? Pus or a bad smell. General instructions  Do not cross your legs. This may decrease blood flow to your feet.  Do not use heating pads or hot water bottles on your feet. They may burn your skin. If you have lost feeling in your feet or legs, you may not know this is happening until it is too late.  Protect your feet from hot and cold by wearing shoes, such as at the beach or on hot pavement.  Schedule a complete foot exam at least once a year (annually) or more often if you have foot problems. If you have foot problems, report any cuts, sores, or bruises to your health care provider immediately. Contact a health care provider if:  You have a medical condition that increases your risk of infection and you have any cuts, sores, or bruises on your feet.  You have an injury that is not   healing.  You have redness on your legs or feet.  You feel burning or tingling in your legs or feet.  You have pain or cramps in your legs and feet.  Your legs or feet are numb.  Your feet always feel cold.  You have pain around a toenail. Get help right away if:  You have a wound, scrape, corn, or callus on your foot and: ? You have pain, swelling, or redness that gets worse. ? You have fluid or blood coming from the wound, scrape, corn, or callus. ? Your wound, scrape, corn, or callus feels warm to the touch. ? You have pus or a bad smell coming from the wound, scrape, corn, or callus. ? You have a fever. ? You have a red line going up your leg. Summary  Check your feet every day  for cuts, sores, red spots, swelling, and blisters.  Moisturize feet and legs daily.  Wear shoes that fit properly and have enough cushioning.  If you have foot problems, report any cuts, sores, or bruises to your health care provider immediately.  Schedule a complete foot exam at least once a year (annually) or more often if you have foot problems. This information is not intended to replace advice given to you by your health care provider. Make sure you discuss any questions you have with your health care provider. Document Released: 03/07/2000 Document Revised: 04/22/2017 Document Reviewed: 04/11/2016 Elsevier Patient Education  2020 Elsevier Inc.  

## 2018-09-26 NOTE — Progress Notes (Signed)
Subjective:  [name] presents to clinic today with cc of  painful, thick, discolored, elongated toenails 1-5 b/l that become tender and cannot cut because of thickness. Pain is aggravated when wearing enclosed shoe gear and relieved with periodic professional debridement.  She lost her husband in June.  Her daughter is present during the visit.  Rankins, Bill Salinas, MD is her PCP.    Current Outpatient Medications:  .  acidophilus (RISAQUAD) CAPS capsule, Take 1 capsule by mouth daily as needed (GI issues). , Disp: , Rfl:  .  atorvastatin (LIPITOR) 40 MG tablet, Take 40 mg by mouth every evening. , Disp: , Rfl:  .  B-D UF III MINI PEN NEEDLES 31G X 5 MM MISC, USE 3 PEN NEEDLES PER DAY, Disp: 100 each, Rfl: 1 .  colchicine 0.6 MG tablet, Take 0.6 mg by mouth daily. , Disp: , Rfl:  .  Cranberry 250 MG CAPS, Take 750 mg by mouth daily. , Disp: , Rfl:  .  econazole nitrate 1 % cream, Apply 1 application topically daily as needed (to groin rash). , Disp: , Rfl:  .  esomeprazole (NEXIUM) 20 MG capsule, Take 20 mg by mouth daily before breakfast. , Disp: , Rfl:  .  ferrous sulfate 325 (65 FE) MG tablet, Take 325 mg by mouth daily with breakfast., Disp: , Rfl:  .  fluticasone (FLONASE) 50 MCG/ACT nasal spray, Place 2 sprays into both nostrils daily as needed for rhinitis. , Disp: , Rfl:  .  furosemide (LASIX) 80 MG tablet, Take 1.5 tablets (120mg  total) by mouth every morning.  Take one tablet by mouth every evening., Disp: 180 tablet, Rfl: 3 .  hydrALAZINE (APRESOLINE) 25 MG tablet, Take 3 tablets (75 mg total) by mouth 3 (three) times daily., Disp: 270 tablet, Rfl: 11 .  insulin lispro (HUMALOG KWIKPEN) 100 UNIT/ML KiwkPen, Inject 7 units before breakfast, Disp: 5 pen, Rfl: 2 .  metolazone (ZAROXOLYN) 5 MG tablet, Take 5 mg by mouth daily as needed (for edema). , Disp: , Rfl: 3 .  metoprolol tartrate (LOPRESSOR) 100 MG tablet, TAKE 1 TABLET BY MOUTH TWICE A DAY, Disp: 180 tablet, Rfl: 3 .   ONETOUCH VERIO test strip, USE 1 STRIP TO CHECK GLUCOSE TWICE DAILY AS DIRECTED, Disp: 100 each, Rfl: 3 .  polyethylene glycol (MIRALAX / GLYCOLAX) packet, Take 17 g by mouth daily as needed for mild constipation., Disp: , Rfl:  .  potassium chloride (K-DUR) 10 MEQ tablet, TAKE 3 TABLETS BY MOUTH EVERY MORNING AND TAKE 3 TABLETS BY MOUTH EVERY EVENING, Disp: 180 tablet, Rfl: 10 .  VICTOZA 18 MG/3ML SOPN, INJECT 1.2MG  SUBCUTANEOUSLY DAILY, Disp: 9 pen, Rfl: 3 .  warfarin (COUMADIN) 5 MG tablet, TAKE AS DIRECTED BY COUMADIN CLINIC, Disp: 120 tablet, Rfl: 1 .  diltiazem (CARDIZEM CD) 180 MG 24 hr capsule, Take 1 capsule (180 mg total) by mouth daily., Disp: 30 capsule, Rfl: 11   Allergies  Allergen Reactions  . Lotensin [Benazepril Hcl] Anaphylaxis  . Zantac [Ranitidine Hcl] Anaphylaxis  . Tape Itching and Rash     Objective: Vitals:   09/22/18 1423  Temp: 98.1 F (36.7 C)    Physical Examination:  Vascular Examination: Capillary refill time <4 seconds x 10 digits.  Nonpalpable DP/PT pulses b/l.  Digital hair absent b/l.  Mild varicosities with mild pedal edema noted b/l.  No pain with calf compression b/l.  Skin temperature gradient WNL b/l.  Dermatological Examination: Skin with normal turgor, texture and  tone b/l.  No open wounds b/l.  No interdigital macerations noted b/l.  Elongated, thick, discolored brittle toenails with subungual debris and pain on dorsal palpation of nailbeds 1-5 b/l.  Musculoskeletal Examination: Muscle strength 5/5 to all muscle groups b/l.  No pain, crepitus or joint discomfort with active/passive ROM.  Neurological Examination: Sensation intact 5/5 b/l with 10 gram monofilament.  Assessment: Mycotic nail infection with pain 1-5 b/l  NIDDM with peripheral circulatory disorder  Plan: 1. Toenails 1-5 b/l were debrided in length and girth without iatrogenic laceration. 2.  Continue soft, supportive shoe gear daily. 3.  Report any  pedal injuries to medical professional. 4.  Follow up 3 months. 5.  Patient/POA to call should there be a question/concern in there interim.

## 2018-09-28 ENCOUNTER — Telehealth: Payer: Self-pay | Admitting: Interventional Cardiology

## 2018-09-28 ENCOUNTER — Telehealth: Payer: Self-pay

## 2018-09-28 DIAGNOSIS — I4811 Longstanding persistent atrial fibrillation: Secondary | ICD-10-CM

## 2018-09-28 DIAGNOSIS — Z7901 Long term (current) use of anticoagulants: Secondary | ICD-10-CM

## 2018-09-28 NOTE — Telephone Encounter (Signed)

## 2018-09-28 NOTE — Telephone Encounter (Signed)
New message   Pt c/o medication issue:  1. Name of Medication: furosemide (LASIX) 80 MG tablet  2. How are you currently taking this medication (dosage and times per day)? 1 1/2 pill in the morning and 1 pill in afternoon  3. Are you having a reaction (difficulty breathing--STAT)? N/a   4. What is your medication issue? Patient states that she is coughing up blood. Patient is not sure if it is coming from the medication. Please call.

## 2018-09-28 NOTE — Telephone Encounter (Signed)
Called home number  x2.  Phone did not ring.  Message stated there is not enough space to leave a VM.  Called and spoke daughter [facility]). She had just left her mom's house.  She said the patient was calling to give an update to Dr. Tamala Julian as he requested, since he changed her medicine recently.  Pt continues to cough up blood.  It is not constant.  She states Dr. Tamala Julian is aware of this.  It is more like bloody mucous when she first gets up in the am and coughs.  It is unchanged and has been happening for about 3 months.  Pt was seen on 09/02/18 by Dr. Tamala Julian.  It looks like her lasix was increased at that visit to 120 mg in am and 80 mg in pm.  Otherwise no changes noted.  Pt on warfarin and is due for next INR check 10/05/18.  Adv pt's daughter that a message would be sent to Dr. Tamala Julian to make aware that this continues. Adv her that if the amount of blood she sees in sputum is more, or there are more frequent episodes of this to call us back.   Will forward to Dr. Tamala Julian and PharmD for review/any further recommendations- pt will be called back.  Otherwise plan to come for labs as scheduled on 10/05/18.  Daughter is in agreement with this plan.

## 2018-09-28 NOTE — Telephone Encounter (Signed)
Thanks

## 2018-09-28 NOTE — Telephone Encounter (Signed)
I will also order a CBC for 10/05/2018. Otherwise no changes needed at this time.

## 2018-10-05 ENCOUNTER — Other Ambulatory Visit: Payer: Self-pay

## 2018-10-05 ENCOUNTER — Ambulatory Visit (INDEPENDENT_AMBULATORY_CARE_PROVIDER_SITE_OTHER): Payer: Medicare Other | Admitting: *Deleted

## 2018-10-05 ENCOUNTER — Other Ambulatory Visit: Payer: Medicare Other

## 2018-10-05 DIAGNOSIS — I5032 Chronic diastolic (congestive) heart failure: Secondary | ICD-10-CM | POA: Diagnosis not present

## 2018-10-05 DIAGNOSIS — I4811 Longstanding persistent atrial fibrillation: Secondary | ICD-10-CM

## 2018-10-05 DIAGNOSIS — N184 Chronic kidney disease, stage 4 (severe): Secondary | ICD-10-CM | POA: Diagnosis not present

## 2018-10-05 DIAGNOSIS — Z7901 Long term (current) use of anticoagulants: Secondary | ICD-10-CM | POA: Diagnosis not present

## 2018-10-05 DIAGNOSIS — I4891 Unspecified atrial fibrillation: Secondary | ICD-10-CM | POA: Diagnosis not present

## 2018-10-05 DIAGNOSIS — Z5181 Encounter for therapeutic drug level monitoring: Secondary | ICD-10-CM | POA: Diagnosis not present

## 2018-10-05 LAB — CBC
Hematocrit: 39.9 % (ref 34.0–46.6)
Hemoglobin: 12.7 g/dL (ref 11.1–15.9)
MCH: 26.9 pg (ref 26.6–33.0)
MCHC: 31.8 g/dL (ref 31.5–35.7)
MCV: 85 fL (ref 79–97)
Platelets: 187 10*3/uL (ref 150–450)
RBC: 4.72 x10E6/uL (ref 3.77–5.28)
RDW: 12.9 % (ref 11.7–15.4)
WBC: 6 10*3/uL (ref 3.4–10.8)

## 2018-10-05 LAB — BASIC METABOLIC PANEL
BUN/Creatinine Ratio: 20 (ref 12–28)
BUN: 38 mg/dL — ABNORMAL HIGH (ref 8–27)
CO2: 23 mmol/L (ref 20–29)
Calcium: 9.4 mg/dL (ref 8.7–10.3)
Chloride: 105 mmol/L (ref 96–106)
Creatinine, Ser: 1.86 mg/dL — ABNORMAL HIGH (ref 0.57–1.00)
GFR calc Af Amer: 29 mL/min/{1.73_m2} — ABNORMAL LOW (ref >59)
GFR calc non Af Amer: 25 mL/min/{1.73_m2} — ABNORMAL LOW (ref >59)
Glucose: 108 mg/dL — ABNORMAL HIGH (ref 65–99)
Potassium: 3.6 mmol/L (ref 3.5–5.2)
Sodium: 143 mmol/L (ref 134–144)

## 2018-10-05 LAB — POCT INR: INR: 2.4 (ref 2.0–3.0)

## 2018-10-05 NOTE — Patient Instructions (Signed)
Description   Continue taking 1 tablet daily except 1.5 tablets on Wednesdays and Fridays. Recheck INR in 4 weeks. [facility]madin Clinic [phone]

## 2018-10-08 ENCOUNTER — Telehealth: Payer: Self-pay | Admitting: Interventional Cardiology

## 2018-10-08 DIAGNOSIS — N184 Chronic kidney disease, stage 4 (severe): Secondary | ICD-10-CM

## 2018-10-08 DIAGNOSIS — I5032 Chronic diastolic (congestive) heart failure: Secondary | ICD-10-CM

## 2018-10-08 DIAGNOSIS — Z7901 Long term (current) use of anticoagulants: Secondary | ICD-10-CM

## 2018-10-08 NOTE — Telephone Encounter (Signed)
Pt has been notified of lab results by phone with verbal understanding. Pt did want to let Dr. Tamala Julian know that she continues to cough up small amounts of blood approximately 5-6 times a day with some clear mucus in between. Pt denies any fevers, dysuria, n/v. She does state though she does not have chest pain it describes the feeling as her chest feeling like when you have a chest cold, she states she has had some sob as well. I assured the pt that I will send my note to triage to touch base with her as well and that I will also let Dr. Tamala Julian and his nurse Marveen Reeks., RN know of our talk today. Pt has also been scheduled for a f/u BMET,CBC to be done 12/03/18. Pt thanked me for the call. Patient notified of result.  Please refer to phone note from today for complete details.   Julaine Hua, CMA 10/08/2018 1:20 PM

## 2018-10-08 NOTE — Telephone Encounter (Signed)
  Patient is returning call for lab results 

## 2018-10-21 DIAGNOSIS — N184 Chronic kidney disease, stage 4 (severe): Secondary | ICD-10-CM | POA: Diagnosis not present

## 2018-10-21 DIAGNOSIS — N2581 Secondary hyperparathyroidism of renal origin: Secondary | ICD-10-CM | POA: Diagnosis not present

## 2018-10-21 DIAGNOSIS — D631 Anemia in chronic kidney disease: Secondary | ICD-10-CM | POA: Diagnosis not present

## 2018-10-21 DIAGNOSIS — E1122 Type 2 diabetes mellitus with diabetic chronic kidney disease: Secondary | ICD-10-CM | POA: Diagnosis not present

## 2018-11-02 ENCOUNTER — Other Ambulatory Visit: Payer: Self-pay

## 2018-11-02 ENCOUNTER — Ambulatory Visit (INDEPENDENT_AMBULATORY_CARE_PROVIDER_SITE_OTHER): Payer: Medicare Other | Admitting: *Deleted

## 2018-11-02 ENCOUNTER — Encounter (INDEPENDENT_AMBULATORY_CARE_PROVIDER_SITE_OTHER): Payer: Self-pay

## 2018-11-02 DIAGNOSIS — Z7901 Long term (current) use of anticoagulants: Secondary | ICD-10-CM

## 2018-11-02 DIAGNOSIS — I4891 Unspecified atrial fibrillation: Secondary | ICD-10-CM

## 2018-11-02 DIAGNOSIS — I4811 Longstanding persistent atrial fibrillation: Secondary | ICD-10-CM | POA: Diagnosis not present

## 2018-11-02 DIAGNOSIS — Z5181 Encounter for therapeutic drug level monitoring: Secondary | ICD-10-CM

## 2018-11-02 LAB — POCT INR: INR: 2.4 (ref 2.0–3.0)

## 2018-11-02 NOTE — Patient Instructions (Signed)
Description   Continue taking 1 tablet daily except 1.5 tablets on Wednesdays and Fridays. Recheck INR in 5 weeks. [facility]madin Clinic [phone]

## 2018-12-03 ENCOUNTER — Other Ambulatory Visit: Payer: Self-pay

## 2018-12-03 ENCOUNTER — Other Ambulatory Visit: Payer: Medicare Other | Admitting: *Deleted

## 2018-12-03 DIAGNOSIS — Z7901 Long term (current) use of anticoagulants: Secondary | ICD-10-CM | POA: Diagnosis not present

## 2018-12-03 DIAGNOSIS — N184 Chronic kidney disease, stage 4 (severe): Secondary | ICD-10-CM

## 2018-12-03 DIAGNOSIS — I5032 Chronic diastolic (congestive) heart failure: Secondary | ICD-10-CM | POA: Diagnosis not present

## 2018-12-03 LAB — CBC
Hematocrit: 36.7 % (ref 34.0–46.6)
Hemoglobin: 12.4 g/dL (ref 11.1–15.9)
MCH: 27.4 pg (ref 26.6–33.0)
MCHC: 33.8 g/dL (ref 31.5–35.7)
MCV: 81 fL (ref 79–97)
Platelets: 197 10*3/uL (ref 150–450)
RBC: 4.53 x10E6/uL (ref 3.77–5.28)
RDW: 12.5 % (ref 11.7–15.4)
WBC: 6.4 10*3/uL (ref 3.4–10.8)

## 2018-12-03 LAB — BASIC METABOLIC PANEL
BUN/Creatinine Ratio: 18 (ref 12–28)
BUN: 33 mg/dL — ABNORMAL HIGH (ref 8–27)
CO2: 23 mmol/L (ref 20–29)
Calcium: 9.5 mg/dL (ref 8.7–10.3)
Chloride: 103 mmol/L (ref 96–106)
Creatinine, Ser: 1.83 mg/dL — ABNORMAL HIGH (ref 0.57–1.00)
GFR calc Af Amer: 29 mL/min/{1.73_m2} — ABNORMAL LOW (ref >59)
GFR calc non Af Amer: 25 mL/min/{1.73_m2} — ABNORMAL LOW (ref >59)
Glucose: 140 mg/dL — ABNORMAL HIGH (ref 65–99)
Potassium: 3.6 mmol/L (ref 3.5–5.2)
Sodium: 140 mmol/L (ref 134–144)

## 2018-12-07 ENCOUNTER — Ambulatory Visit (INDEPENDENT_AMBULATORY_CARE_PROVIDER_SITE_OTHER): Payer: Medicare Other | Admitting: *Deleted

## 2018-12-07 ENCOUNTER — Other Ambulatory Visit: Payer: Self-pay

## 2018-12-07 DIAGNOSIS — I4811 Longstanding persistent atrial fibrillation: Secondary | ICD-10-CM | POA: Diagnosis not present

## 2018-12-07 DIAGNOSIS — I4891 Unspecified atrial fibrillation: Secondary | ICD-10-CM

## 2018-12-07 DIAGNOSIS — Z5181 Encounter for therapeutic drug level monitoring: Secondary | ICD-10-CM

## 2018-12-07 LAB — POCT INR: INR: 2.7 (ref 2.0–3.0)

## 2018-12-07 NOTE — Patient Instructions (Signed)
Description   Continue taking 1 tablet daily except 1.5 tablets on Wednesdays and Fridays. Recheck INR in 6 weeks. [facility]in Clinic [phone]

## 2018-12-22 ENCOUNTER — Other Ambulatory Visit: Payer: Self-pay

## 2018-12-22 ENCOUNTER — Ambulatory Visit (INDEPENDENT_AMBULATORY_CARE_PROVIDER_SITE_OTHER): Payer: Medicare Other | Admitting: Podiatry

## 2018-12-22 ENCOUNTER — Other Ambulatory Visit: Payer: Self-pay | Admitting: Interventional Cardiology

## 2018-12-22 ENCOUNTER — Encounter: Payer: Self-pay | Admitting: Podiatry

## 2018-12-22 DIAGNOSIS — M79674 Pain in right toe(s): Secondary | ICD-10-CM | POA: Diagnosis not present

## 2018-12-22 DIAGNOSIS — B351 Tinea unguium: Secondary | ICD-10-CM | POA: Diagnosis not present

## 2018-12-22 DIAGNOSIS — E1151 Type 2 diabetes mellitus with diabetic peripheral angiopathy without gangrene: Secondary | ICD-10-CM | POA: Diagnosis not present

## 2018-12-22 DIAGNOSIS — M79675 Pain in left toe(s): Secondary | ICD-10-CM | POA: Diagnosis not present

## 2018-12-22 NOTE — Patient Instructions (Signed)

## 2018-12-23 NOTE — Progress Notes (Signed)
Subjective:  [name] presents to clinic today with cc of  painful, thick, discolored, elongated toenails 1-5 b/l that become tender and cannot cut because of thickness. Pain is aggravated when wearing enclosed shoe gear.  Daughter is present during the visit. They voice no new pedal problems on today's visit.  Rankins, Bill Salinas, MD is her PCP.   Current Outpatient Medications on File Prior to Visit  Medication Sig Dispense Refill  . acidophilus (RISAQUAD) CAPS capsule Take 1 capsule by mouth daily as needed (GI issues).     Marland Kitchen atorvastatin (LIPITOR) 40 MG tablet Take 40 mg by mouth every evening.     . B-D UF III MINI PEN NEEDLES 31G X 5 MM MISC USE 3 PEN NEEDLES PER DAY 100 each 1  . colchicine 0.6 MG tablet Take 0.6 mg by mouth daily.     . Cranberry 250 MG CAPS Take 750 mg by mouth daily.     Marland Kitchen diltiazem (CARDIZEM CD) 180 MG 24 hr capsule Take 1 capsule (180 mg total) by mouth daily. 30 capsule 11  . econazole nitrate 1 % cream Apply 1 application topically daily as needed (to groin rash).     Marland Kitchen esomeprazole (NEXIUM) 20 MG capsule Take 20 mg by mouth daily before breakfast.     . ferrous sulfate 325 (65 FE) MG tablet Take 325 mg by mouth daily with breakfast.    . fluticasone (FLONASE) 50 MCG/ACT nasal spray Place 2 sprays into both nostrils daily as needed for rhinitis.     . furosemide (LASIX) 80 MG tablet Take 1.5 tablets (120mg  total) by mouth every morning.  Take one tablet by mouth every evening. 180 tablet 3  . insulin lispro (HUMALOG KWIKPEN) 100 UNIT/ML KiwkPen Inject 7 units before breakfast 5 pen 2  . metolazone (ZAROXOLYN) 5 MG tablet Take 5 mg by mouth daily as needed (for edema).   3  . metoprolol tartrate (LOPRESSOR) 100 MG tablet TAKE 1 TABLET BY MOUTH TWICE A DAY 180 tablet 3  . ONETOUCH VERIO test strip USE 1 STRIP TO CHECK GLUCOSE TWICE DAILY AS DIRECTED 100 each 3  . polyethylene glycol (MIRALAX / GLYCOLAX) packet Take 17 g by mouth daily as needed for mild  constipation.    . potassium chloride (K-DUR) 10 MEQ tablet TAKE 3 TABLETS BY MOUTH EVERY MORNING AND TAKE 3 TABLETS BY MOUTH EVERY EVENING 180 tablet 10  . VICTOZA 18 MG/3ML SOPN INJECT 1.2MG  SUBCUTANEOUSLY DAILY 9 pen 3  . warfarin (COUMADIN) 5 MG tablet TAKE AS DIRECTED BY COUMADIN CLINIC 120 tablet 1   No current facility-administered medications on file prior to visit.      Allergies  Allergen Reactions  . Lotensin [Benazepril Hcl] Anaphylaxis  . Zantac [Ranitidine Hcl] Anaphylaxis  . Tape Itching and Rash   Objective:  Physical Examination:  Vascular Examination: Capillary refill time <4 digits x 10 digits.  Nonpalpable DP/PT pulses b/l.  Digital hair sparse b/l.  Mild varicosities b/l. Nonpitting edema noted b/l feet/ankles, chronic. No pain with calf compression.  Skin temperature gradient WNL b/l.  Dermatological Examination: Skin with normal turgor, texture and tone b/l.  No open wounds b/l.  No interdigital macerations noted b/l.  Elongated, thick, discolored brittle toenails with subungual debris and pain on dorsal palpation of nailbeds 1-5 b/l.  Musculoskeletal Examination: Muscle strength 5/5 to all muscle groups b/l.  Pes planus foot type b/l.  No pain, crepitus or joint discomfort with active/passive ROM.  Neurological Examination:  Sensation intact 5/5 b/l with 10 gram monofilament.  Assessment: Mycotic nail infection with pain 1-5 b/l NIDDM with PAD  Plan: 1.  Toenails 1-5 b/l were debrided in length and girth. Pinpoint bleeding addressed with Lumicaine Hemostatic Solution. Cleansed with alcohol. No further treatment required by patient.  2.  Continue soft, supportive shoe gear daily. 3.  Report any pedal injuries to medical professional. 4.  Follow up 3 months. 5.  Patient/POA to call should there be a question/concern in there interim.

## 2019-01-05 DIAGNOSIS — R05 Cough: Secondary | ICD-10-CM | POA: Diagnosis not present

## 2019-01-24 ENCOUNTER — Other Ambulatory Visit: Payer: Self-pay | Admitting: Endocrinology

## 2019-01-26 ENCOUNTER — Other Ambulatory Visit: Payer: Self-pay | Admitting: Endocrinology

## 2019-01-28 ENCOUNTER — Other Ambulatory Visit: Payer: Self-pay

## 2019-01-28 ENCOUNTER — Telehealth: Payer: Self-pay

## 2019-01-28 ENCOUNTER — Ambulatory Visit (INDEPENDENT_AMBULATORY_CARE_PROVIDER_SITE_OTHER): Payer: Medicare Other

## 2019-01-28 DIAGNOSIS — I4891 Unspecified atrial fibrillation: Secondary | ICD-10-CM | POA: Diagnosis not present

## 2019-01-28 DIAGNOSIS — Z5181 Encounter for therapeutic drug level monitoring: Secondary | ICD-10-CM

## 2019-01-28 LAB — POCT INR: INR: 2.4 (ref 2.0–3.0)

## 2019-01-28 MED ORDER — VICTOZA 18 MG/3ML ~~LOC~~ SOPN: PEN_INJECTOR | SUBCUTANEOUS | 0 refills | Status: DC

## 2019-01-28 NOTE — Patient Instructions (Signed)
Description   Continue on same dosage 1 tablet daily except 1.5 tablets on Wednesdays and Fridays. Recheck INR in 6 weeks.  [facility] Clinic [phone]      

## 2019-01-28 NOTE — Telephone Encounter (Signed)
MEDICATION: VICTOZA 18 MG/3ML SOPN  PHARMACY:  [id number] - [place], [place] - [address]  IS THIS A 90 DAY SUPPLY : yes  IS PATIENT OUT OF MEDICATION:   IF NOT; HOW MUCH IS LEFT:   LAST APPOINTMENT DATE: @11 /06/2018  NEXT APPOINTMENT DATE:@12 /05/2018  DO WE HAVE YOUR PERMISSION TO LEAVE A DETAILED MESSAGE:  OTHER COMMENTS:    **Let patient know to contact pharmacy at the end of the day to make sure medication is ready. **  ** Please notify patient to allow 48-72 hours to process**  **Encourage patient to contact the pharmacy for refills or they can request refills through [facility]**

## 2019-01-28 NOTE — Telephone Encounter (Signed)
Rx sent for 30 day supply. Will send 90 day supply once pt has f/u next month.

## 2019-01-31 DIAGNOSIS — D631 Anemia in chronic kidney disease: Secondary | ICD-10-CM | POA: Diagnosis not present

## 2019-01-31 DIAGNOSIS — N2581 Secondary hyperparathyroidism of renal origin: Secondary | ICD-10-CM | POA: Diagnosis not present

## 2019-01-31 DIAGNOSIS — N184 Chronic kidney disease, stage 4 (severe): Secondary | ICD-10-CM | POA: Diagnosis not present

## 2019-01-31 DIAGNOSIS — E1122 Type 2 diabetes mellitus with diabetic chronic kidney disease: Secondary | ICD-10-CM | POA: Diagnosis not present

## 2019-02-01 DIAGNOSIS — E7800 Pure hypercholesterolemia, unspecified: Secondary | ICD-10-CM | POA: Diagnosis not present

## 2019-02-01 DIAGNOSIS — I480 Paroxysmal atrial fibrillation: Secondary | ICD-10-CM | POA: Diagnosis not present

## 2019-02-01 DIAGNOSIS — I5030 Unspecified diastolic (congestive) heart failure: Secondary | ICD-10-CM | POA: Diagnosis not present

## 2019-02-01 DIAGNOSIS — E1122 Type 2 diabetes mellitus with diabetic chronic kidney disease: Secondary | ICD-10-CM | POA: Diagnosis not present

## 2019-02-03 ENCOUNTER — Other Ambulatory Visit: Payer: Self-pay | Admitting: Endocrinology

## 2019-02-03 DIAGNOSIS — E1022 Type 1 diabetes mellitus with diabetic chronic kidney disease: Secondary | ICD-10-CM

## 2019-02-06 ENCOUNTER — Other Ambulatory Visit: Payer: Self-pay | Admitting: Interventional Cardiology

## 2019-02-08 ENCOUNTER — Other Ambulatory Visit: Payer: Medicare Other

## 2019-02-08 ENCOUNTER — Other Ambulatory Visit: Payer: Self-pay

## 2019-02-08 DIAGNOSIS — I5032 Chronic diastolic (congestive) heart failure: Secondary | ICD-10-CM

## 2019-02-08 DIAGNOSIS — Z79899 Other long term (current) drug therapy: Secondary | ICD-10-CM

## 2019-02-08 DIAGNOSIS — N184 Chronic kidney disease, stage 4 (severe): Secondary | ICD-10-CM

## 2019-02-08 LAB — HEPATIC FUNCTION PANEL
ALT: 20 IU/L (ref 0–32)
AST: 26 IU/L (ref 0–40)
Albumin: 3.4 g/dL — ABNORMAL LOW (ref 3.6–4.6)
Alkaline Phosphatase: 57 IU/L (ref 39–117)
Bilirubin Total: 0.6 mg/dL (ref 0.0–1.2)
Bilirubin, Direct: 0.28 mg/dL (ref 0.00–0.40)
Total Protein: 6.2 g/dL (ref 6.0–8.5)

## 2019-02-08 LAB — BASIC METABOLIC PANEL
BUN/Creatinine Ratio: 19 (ref 12–28)
BUN: 37 mg/dL — ABNORMAL HIGH (ref 8–27)
CO2: 23 mmol/L (ref 20–29)
Calcium: 9.4 mg/dL (ref 8.7–10.3)
Chloride: 105 mmol/L (ref 96–106)
Creatinine, Ser: 1.98 mg/dL — ABNORMAL HIGH (ref 0.57–1.00)
GFR calc Af Amer: 26 mL/min/{1.73_m2} — ABNORMAL LOW (ref >59)
GFR calc non Af Amer: 23 mL/min/{1.73_m2} — ABNORMAL LOW (ref >59)
Glucose: 140 mg/dL — ABNORMAL HIGH (ref 65–99)
Potassium: 3.8 mmol/L (ref 3.5–5.2)
Sodium: 141 mmol/L (ref 134–144)

## 2019-02-08 LAB — TSH: TSH: 2.8 u[IU]/mL (ref 0.450–4.500)

## 2019-02-22 ENCOUNTER — Other Ambulatory Visit: Payer: Self-pay

## 2019-02-24 ENCOUNTER — Ambulatory Visit (INDEPENDENT_AMBULATORY_CARE_PROVIDER_SITE_OTHER): Payer: Medicare Other | Admitting: Endocrinology

## 2019-02-24 ENCOUNTER — Encounter: Payer: Self-pay | Admitting: Endocrinology

## 2019-02-24 ENCOUNTER — Other Ambulatory Visit: Payer: Self-pay

## 2019-02-24 VITALS — BP 138/70 | HR 97 | Ht 64.0 in | Wt 155.2 lb

## 2019-02-24 DIAGNOSIS — E1165 Type 2 diabetes mellitus with hyperglycemia: Secondary | ICD-10-CM | POA: Diagnosis not present

## 2019-02-24 DIAGNOSIS — Z23 Encounter for immunization: Secondary | ICD-10-CM

## 2019-02-24 DIAGNOSIS — Z794 Long term (current) use of insulin: Secondary | ICD-10-CM

## 2019-02-24 LAB — POCT GLYCOSYLATED HEMOGLOBIN (HGB A1C): Hemoglobin A1C: 6.4 % — AB (ref 4.0–5.6)

## 2019-02-24 LAB — GLUCOSE, POCT (MANUAL RESULT ENTRY): POC Glucose: 238 mg/dl — AB (ref 70–99)

## 2019-02-24 NOTE — Progress Notes (Signed)
Patient ID: [name], female   DOB: 1933-12-08, 85 y.o.   MRN: [id number]    Reason for Appointment: Follow-up for Type 2 Diabetes  Referring physician: Jordan Hawks Rankins  History of Present Illness:          Diagnosis: Type 2 diabetes mellitus, date of diagnosis: ? 70 years ago        Past history: She was initially treated with Metformin but this was done because it caused GI side effects  May also tried Actos at some point, probably started because of the fear of side effects but did not know if it helped Actos stopped ? Reason Over the last few years she has been taking Precose with meals and also Januvia in increasing doses Apparently her blood sugars have been higher since 9/14 when she had abdominal surgery and before that they were as low as 70. On her initial consultation in 6/15 because of her renal dysfunction her medication regimen was changed. Previously was taking Januvia, Precose and glipizide without adequate control; her blood sugars were occasionally over 200 fasting and A1c was 9.4 She was started on Victoza 0.6 mg and Lantus insulin 8 units a day but the Lantus had to be stopped because of relatively low fasting glucose.  Since she had tried Prandin and glipizide and her postprandial readings were as high as 330 she was started on Humalog at mealtimes in 8/15  Recent history:   She has not been seen in follow-up since 02/2018  Current diabetes treatment regimen: Victoza 1.2 mg daily  Her A1c is the same at 6.4 although it has been as low as 5.8 previously   Current blood sugar patterns, current management  and problems identified:  She has checked her blood sugars only very rarely  Most of her sugars are done at bedtime around midnight and occasionally fasting  Not clear why her blood sugar is over 200 today in the office after breakfast  She had a half [place] sandwich, applesauce and coffee this morning without any other carbohydrate or sugar  containing beverages  Previously had been on mealtime insulin at dinnertime which had been stopped  She was told to stop drinking sweet tea on her last visit  Taking Victoza consistently every morning  She has a relatively large breakfast, sometimes high in fat   Usually eating a 1/2 sandwich at lunch, sometimes less       Oral hypoglycemic drugs the patient is taking are: none  Side effects from medications have been: None   Glucose monitoring:  done once  a day        Glucometer:  One Touch Verio Blood sugars from download:  Has only 5 readings ranging from 79-145, mostly done at bedtime  Previous readings  PRE-MEAL Fasting Lunch Dinner Bedtime Overall  Glucose range:  99, 105   143, 207    Mean/median:    220 196   POST-MEAL PC Breakfast PC Lunch PC Dinner  Glucose range:   ?  Mean/median:         Self-care:   Meals: 3 meals per day.   Breakfast: eggs, grits,meat;  sometimes 1/2 Boost; will have spaghetti or rice at supper. Dinner 6 pm           Exercise:  none, unable to          Dietician visit: Most recent: Unknown.               Weight history:  Wt Readings from Last 3 Encounters:  02/24/19 155 lb 3.2 oz (70.4 kg)  09/02/18 159 lb 6.4 oz (72.3 kg)  05/18/18 177 lb 12.8 oz (80.6 kg)   Glycemic control:  Lab Results  Component Value Date   HGBA1C 6.4 (A) 02/24/2019   HGBA1C 6.4 (A) 03/09/2018   HGBA1C 6.5 (A) 12/08/2017   Lab Results  Component Value Date   MICROALBUR 132.3 (H) 06/18/2017   LDLCALC 36 07/18/2016   CREATININE 1.98 (H) 02/08/2019     Allergies as of 02/24/2019      Reactions   Lotensin [benazepril Hcl] Anaphylaxis   Zantac [ranitidine Hcl] Anaphylaxis   Tape Itching, Rash      Medication List       Accurate as of February 24, 2019 12:18 PM. If you have any questions, ask your nurse or doctor.        acidophilus Caps capsule Take 1 capsule by mouth daily as needed (GI issues).   atorvastatin 40 MG tablet Commonly known as:  LIPITOR Take 40 mg by mouth every evening.   B-D UF III MINI PEN NEEDLES 31G X 5 MM Misc Generic drug: Insulin Pen Needle USE 3 PEN NEEDLES PER DAY   colchicine 0.6 MG tablet Take 0.6 mg by mouth daily.   Cranberry 250 MG Caps Take 750 mg by mouth daily.   diltiazem 180 MG 24 hr capsule Commonly known as: CARDIZEM CD Take 1 capsule (180 mg total) by mouth daily.   econazole nitrate 1 % cream Apply 1 application topically daily as needed (to groin rash).   esomeprazole 20 MG capsule Commonly known as: NEXIUM Take 20 mg by mouth daily before breakfast.   ferrous sulfate 325 (65 FE) MG tablet Take 325 mg by mouth daily with breakfast.   fluticasone 50 MCG/ACT nasal spray Commonly known as: FLONASE Place 2 sprays into both nostrils daily as needed for rhinitis.   furosemide 80 MG tablet Commonly known as: LASIX Take 1.5 tablets (120mg  total) by mouth every morning.  Take one tablet by mouth every evening.   hydrALAZINE 25 MG tablet Commonly known as: APRESOLINE TAKE 3 TABLETS (75 MG TOTAL) BY MOUTH 3 (THREE) TIMES DAILY.   insulin lispro 100 UNIT/ML KiwkPen Commonly known as: HumaLOG KwikPen Inject 7 units before breakfast   metolazone 5 MG tablet Commonly known as: ZAROXOLYN Take 5 mg by mouth daily as needed (for edema).   metoprolol tartrate 100 MG tablet Commonly known as: LOPRESSOR TAKE 1 TABLET BY MOUTH TWICE A DAY   OneTouch Verio test strip Generic drug: glucose blood USE 1 STRIP TO CHECK GLUCOSE TWICE DAILY AS DIRECTED   polyethylene glycol 17 g packet Commonly known as: MIRALAX / GLYCOLAX Take 17 g by mouth daily as needed for mild constipation.   potassium chloride 10 MEQ tablet Commonly known as: KLOR-CON TAKE 3 TABLETS BY MOUTH EVERY MORNING AND TAKE 3 TABLETS BY MOUTH EVERY EVENING   Victoza 18 MG/3ML Sopn Generic drug: liraglutide INJECT 1.2MG  SUBCUTANEOUSLY DAILY. ** Will issue 90 day supply after seen for f/u.**   warfarin 5 MG tablet  Commonly known as: COUMADIN Take as directed by the anticoagulation clinic. If you are unsure how to take this medication, talk to your nurse or doctor. Original instructions: TAKE AS DIRECTED BY COUMADIN CLINIC       Allergies:  Allergies  Allergen Reactions  . Lotensin [Benazepril Hcl] Anaphylaxis  . Zantac [Ranitidine Hcl] Anaphylaxis  . Tape Itching and Rash  Past Medical History:  Diagnosis Date  . Anemia due to blood loss, chronic 08/23/2013  . Atrial fibrillation ([place]) 12/09/2012  . Candidal skin infection 12/23/2012  . Chronic diastolic heart failure ([place]) 08/23/2013  . Chronic kidney disease, stage IV (severe) ([place]) 12/09/2012  . Diabetes mellitus with renal complications ([place]) 123456  . Diabetes mellitus without complication ([place])   . Dyslipidemia 12/09/2012  . Fall at home Sept. 3, 2016   Fx  3 ribs  . GERD (gastroesophageal reflux disease) 12/23/2012  . HTN (hypertension) 12/09/2012  . Hyperlipidemia 12/23/2012  . Hypertension   . Ileus, postoperative ([place]) 12/17/2012  . Intractable pain 12/03/2014  . Irregular heart beat   . Long term current use of anticoagulant therapy 02/22/2013  . On amiodarone therapy 02/22/2013  . Peptic esophagitis 08/23/2013  . Pressure injury of skin 05/21/2016  . Renal disorder   . Type II diabetes mellitus, uncontrolled ([place]) 09/06/2013  . Umbilical hernia, incarcerated - reducible 12/10/2012    Past Surgical History:  Procedure Laterality Date  . BREAST LUMPECTOMY Right 2001   2 lumpectomies with radiation both 2001  . CATARACT EXTRACTION, BILATERAL    . HERNIA REPAIR    . I&D EXTREMITY Right 05/19/2016   Procedure: MINOR IRRIGATION AND DEBRIDEMENT Right great toe;  Surgeon: Gaynelle Arabian, MD;  Location: [facility];  Service: Orthopedics;  Laterality: Right;  . INSERTION OF MESH N/A 12/12/2012   Procedure: INSERTION OF MESH;  Surgeon: Madilyn Hook, DO;  Location: [facility];  Service: General;  Laterality: N/A;  . LAPAROSCOPIC LYSIS OF ADHESIONS  N/A 12/12/2012   Procedure: LAPAROSCOPIC LYSIS OF ADHESIONS;  Surgeon: Madilyn Hook, DO;  Location: [facility];  Service: General;  Laterality: N/A;  . Right Lumpectomy    . VENTRAL HERNIA REPAIR N/A 12/12/2012   Procedure: LAPAROSCOPIC VENTRAL HERNIA;  Surgeon: Madilyn Hook, DO;  Location: [facility];  Service: General;  Laterality: N/A;    Family History  Problem Relation Age of Onset  . Diabetes Mother   . Diabetes Sister     Social History:  reports that she has quit smoking. Her smoking use included cigarettes. She has never used smokeless tobacco. She reports that she does not drink alcohol or use drugs.    Review of Systems        Lipids: She has been on 40 mg dose Lipitor for hyperlipidemia Her last LDL was 52 done in 04/2017 This is followed elsewhere      Lab Results  Component Value Date   CHOL 112 07/18/2016   HDL 53.70 07/18/2016   LDLCALC 36 07/18/2016   LDLDIRECT 37.8 10/27/2013   TRIG 111.0 07/18/2016   CHOLHDL 2 07/18/2016       The blood pressure has been treated with multiple medications including 10 mg amlodipine  Followed by other physicians Including cardiologist and nephrologist   BP Readings from Last 3 Encounters:  02/24/19 138/70  09/02/18 136/72  08/13/18 (!) 158/87       She has had history of swelling of legs and is on 80 mg Lasix      Chronic kidney disease followed by nephrologist   Lab Results  Component Value Date   CREATININE 1.98 (H) 02/08/2019   Last diabetic foot exam in 9/20 with podiatrist  She has weakness in her legs since 2014; needs a walker and cannot walk  She is reluctant to take the influenza vaccine but reassured her about the safety and stressed importance of taking this because of her  high risk status  Physical Examination:  BP 138/70 (BP Location: Left Arm, Patient Position: Sitting, Cuff Size: Normal)   Pulse 97   Ht 5\' 4"  (1.626 m)   Wt 155 lb 3.2 oz (70.4 kg)   SpO2 99%   BMI 26.64 kg/m     ASSESSMENT:   Diabetes type 2, long-standing  See history of present illness for detailed discussion of his current management, blood sugar patterns and problems identified  She has a relatively low A1c at 6.4  She may have some postprandial hyperglycemia even with taking Victoza Today blood sugar is over 200 after breakfast Currently checking blood sugars very sporadically at home Her test strips were verified and they are not outdated   RENAL dysfunction: Followed by nephrologist, reminded her to follow-up regularly   HYPERTENSION: Her blood pressure is controlled, to follow-up with her PCP and cardiologist   PLAN:   She will start checking sugars 2 hours after breakfast or dinner and let us know if they are over 200 Consider adding Prandin at least at breakfast with 0.5 mg No change in Victoza Continue to avoid drinks with sugar and have protein with every meal consistently  Influenza vaccine, high dose given  Patient Instructions  Check blood sugars on waking up 1-2 days a week  Also check blood sugars about 2 hours after meals and do this after different meals by rotation  Recommended blood sugar levels on waking up are 90-130 and about 2 hours after meal is 130-180  Please bring your blood sugar monitor to each visit, thank you  Call IF SUGARS ARE OVER 200 AFTER BFST OR DINNER      Elayne Snare 02/24/2019, 12:18 PM   Note: This office note was prepared with Dragon voice recognition system technology. Any transcriptional errors that result from this process are unintentional.

## 2019-02-24 NOTE — Patient Instructions (Addendum)
Check blood sugars on waking up 1-2 days a week  Also check blood sugars about 2 hours after meals and do this after different meals by rotation  Recommended blood sugar levels on waking up are 90-130 and about 2 hours after meal is 130-180  Please bring your blood sugar monitor to each visit, thank you  Call IF SUGARS ARE OVER 200 AFTER BFST OR DINNER

## 2019-02-28 NOTE — Progress Notes (Signed)
Cardiology Office Note:    Date:  03/02/2019   ID:  [name], DOB 08/25/1933, MRN [id number]  PCP:  Aretta Nip, MD  Cardiologist:  Sinclair Grooms, MD   Referring MD: Aretta Nip, MD   Chief Complaint  Patient presents with  . Congestive Heart Failure  . Atrial Fibrillation    History of Present Illness:    [name] is a 85 y.o. female with a hx of chronic atrial fib, rate control, CKD IV, hypertension, chronic diastolic heart failure, elderly and frail.  Stable orthopnea, and dyspnea on exertion.  She denies angina.  She has not had syncope.  Spends most of her time in the bed.  Lower extremities are not swollen.  She has not been dizzy or lightheaded when ambulating.  Her typical activities include making breakfast and cooking other meals for herself.  She otherwise is sitting or lying.  Past Medical History:  Diagnosis Date  . Anemia due to blood loss, chronic 08/23/2013  . Atrial fibrillation ([place]) 12/09/2012  . Candidal skin infection 12/23/2012  . Chronic diastolic heart failure ([place]) 08/23/2013  . Chronic kidney disease, stage IV (severe) ([place]) 12/09/2012  . Diabetes mellitus with renal complications ([place]) 123456  . Diabetes mellitus without complication ([place])   . Dyslipidemia 12/09/2012  . Fall at home Sept. 3, 2016   Fx  3 ribs  . GERD (gastroesophageal reflux disease) 12/23/2012  . HTN (hypertension) 12/09/2012  . Hyperlipidemia 12/23/2012  . Hypertension   . Ileus, postoperative ([place]) 12/17/2012  . Intractable pain 12/03/2014  . Irregular heart beat   . Long term current use of anticoagulant therapy 02/22/2013  . On amiodarone therapy 02/22/2013  . Peptic esophagitis 08/23/2013  . Pressure injury of skin 05/21/2016  . Renal disorder   . Type II diabetes mellitus, uncontrolled ([place]) 09/06/2013  . Umbilical hernia, incarcerated - reducible 12/10/2012    Past Surgical History:  Procedure Laterality Date  . BREAST LUMPECTOMY Right 2001   2  lumpectomies with radiation both 2001  . CATARACT EXTRACTION, BILATERAL    . HERNIA REPAIR    . I&D EXTREMITY Right 05/19/2016   Procedure: MINOR IRRIGATION AND DEBRIDEMENT Right great toe;  Surgeon: Gaynelle Arabian, MD;  Location: [facility];  Service: Orthopedics;  Laterality: Right;  . INSERTION OF MESH N/A 12/12/2012   Procedure: INSERTION OF MESH;  Surgeon: Madilyn Hook, DO;  Location: [facility];  Service: General;  Laterality: N/A;  . LAPAROSCOPIC LYSIS OF ADHESIONS N/A 12/12/2012   Procedure: LAPAROSCOPIC LYSIS OF ADHESIONS;  Surgeon: Madilyn Hook, DO;  Location: [facility];  Service: General;  Laterality: N/A;  . Right Lumpectomy    . VENTRAL HERNIA REPAIR N/A 12/12/2012   Procedure: LAPAROSCOPIC VENTRAL HERNIA;  Surgeon: Madilyn Hook, DO;  Location: [facility];  Service: General;  Laterality: N/A;    Current Medications: Current Meds  Medication Sig  . acidophilus (RISAQUAD) CAPS capsule Take 1 capsule by mouth daily as needed (GI issues).   Marland Kitchen atorvastatin (LIPITOR) 40 MG tablet Take 40 mg by mouth every evening.   . B-D UF III MINI PEN NEEDLES 31G X 5 MM MISC USE 3 PEN NEEDLES PER DAY  . colchicine 0.6 MG tablet Take 0.6 mg by mouth daily.   . Cranberry 250 MG CAPS Take 750 mg by mouth daily.   Marland Kitchen econazole nitrate 1 % cream Apply 1 application topically daily as needed (to groin rash).   Marland Kitchen esomeprazole (NEXIUM) 20 MG capsule  Take 20 mg by mouth daily before breakfast.   . ferrous sulfate 325 (65 FE) MG tablet Take 325 mg by mouth daily with breakfast.  . fluticasone (FLONASE) 50 MCG/ACT nasal spray Place 2 sprays into both nostrils daily as needed for rhinitis.   . furosemide (LASIX) 80 MG tablet Take 1.5 tablets (120mg  total) by mouth every morning.  Take one tablet by mouth every evening.  . hydrALAZINE (APRESOLINE) 25 MG tablet TAKE 3 TABLETS (75 MG TOTAL) BY MOUTH 3 (THREE) TIMES DAILY.  Marland Kitchen liraglutide (VICTOZA) 18 MG/3ML SOPN INJECT 1.2MG  SUBCUTANEOUSLY DAILY. ** Will issue 90 day supply after  seen for f/u.**  . metolazone (ZAROXOLYN) 5 MG tablet Take 5 mg by mouth daily as needed (for edema).   . metoprolol tartrate (LOPRESSOR) 100 MG tablet TAKE 1 TABLET BY MOUTH TWICE A DAY  . ONETOUCH VERIO test strip USE 1 STRIP TO CHECK GLUCOSE TWICE DAILY AS DIRECTED  . polyethylene glycol (MIRALAX / GLYCOLAX) packet Take 17 g by mouth daily as needed for mild constipation.  . potassium chloride (KLOR-CON) 10 MEQ tablet TAKE 3 TABLETS BY MOUTH EVERY MORNING AND TAKE 3 TABLETS BY MOUTH EVERY EVENING  . warfarin (COUMADIN) 5 MG tablet TAKE AS DIRECTED BY COUMADIN CLINIC     Allergies:   Lotensin [benazepril hcl], Zantac [ranitidine hcl], and Tape   Social History   Socioeconomic History  . Marital status: Widowed    Spouse name: Not on file  . Number of children: Not on file  . Years of education: Not on file  . Highest education level: Not on file  Occupational History  . Not on file  Social Needs  . Financial resource strain: Not on file  . Food insecurity    Worry: Not on file    Inability: Not on file  . Transportation needs    Medical: Not on file    Non-medical: Not on file  Tobacco Use  . Smoking status: Former Smoker    Types: Cigarettes  . Smokeless tobacco: Never Used  Substance and Sexual Activity  . Alcohol use: No  . Drug use: No  . Sexual activity: Never  Lifestyle  . Physical activity    Days per week: Not on file    Minutes per session: Not on file  . Stress: Not on file  Relationships  . Social Herbalist on phone: Not on file    Gets together: Not on file    Attends religious service: Not on file    Active member of club or organization: Not on file    Attends meetings of clubs or organizations: Not on file    Relationship status: Not on file  Other Topics Concern  . Not on file  Social History Narrative  . Not on file     Family History: The patient's family history includes Diabetes in her mother and sister.  ROS:   Please see  the history of present illness.    Dyspnea on exertion and recent episode of pneumonia.  All other systems reviewed and are negative.  EKGs/Labs/Other Studies Reviewed:    The following studies were reviewed today: No new data  EKG:  EKG no new EKG  Recent Labs: 05/11/2018: NT-Pro BNP 19,800 08/13/2018: B Natriuretic Peptide 1,157.9 12/03/2018: Hemoglobin 12.4; Platelets 197 02/08/2019: ALT 20; BUN 37; Creatinine, Ser 1.98; Potassium 3.8; Sodium 141; TSH 2.800  Recent Lipid Panel    Component Value Date/Time   CHOL 112 07/18/2016  1005   TRIG 111.0 07/18/2016 1005   HDL 53.70 07/18/2016 1005   CHOLHDL 2 07/18/2016 1005   VLDL 22.2 07/18/2016 1005   LDLCALC 36 07/18/2016 1005   LDLDIRECT 37.8 10/27/2013 0808    Physical Exam:    VS:  BP (!) 146/72   Pulse 99   Ht 5\' 6"  (1.676 m)   Wt 154 lb 12.8 oz (70.2 kg)   SpO2 95%   BMI 24.99 kg/m     Wt Readings from Last 3 Encounters:  03/02/19 154 lb 12.8 oz (70.2 kg)  02/24/19 155 lb 3.2 oz (70.4 kg)  09/02/18 159 lb 6.4 oz (72.3 kg)     GEN: Frail, with moderate obesity and continued noticeable weight loss over the past 12 to 18 months. No acute distress HEENT: Normal NECK: Elevated neck veins sitting. LYMPHATICS: No lymphadenopathy CARDIAC: Irregularly irregular RR without murmur, gallop, or edema.  Lichenification of bilateral lower extremities related to chronic edema VASCULAR:  Normal Pulses. No bruits. RESPIRATORY:  Clear to auscultation without rales, wheezing or rhonchi  ABDOMEN: Soft, non-tender, non-distended, No pulsatile mass, MUSCULOSKELETAL: No deformity  SKIN: Warm and dry NEUROLOGIC:  Alert and oriented x 3 PSYCHIATRIC:  Normal affect   ASSESSMENT:    1. Chronic diastolic heart failure ([place])   2. Permanent atrial fibrillation ([place])   3. Long term current use of anticoagulant therapy   4. Chronic kidney disease, stage IV (severe) ([place])   5. Essential hypertension   6. Diabetes mellitus due to  underlying condition with stage 4 chronic kidney disease, without long-term current use of insulin ([place])   7. Educated about COVID-19 virus infection    PLAN:    In order of problems listed above:  1. No gross volume overload although the neck veins are noticeable with the patient's sitting.  Because dyspnea and orthopnea are significant, will institute metolazone 2.5 mg once per week 30 minutes prior to the a.m. furosemide dose.  A basic metabolic panel will be done at the end of [redacted] weeks along with a brain natruretic peptide 2. Rate control is adequate 3. No bleeding complications 4. We will check kidney function in 2 weeks 5. Adequate blood pressure for age 61. Not discussed 7. 3W's is practiced.  She should call if lightheadedness dizziness.  Otherwise return for in person office visit in 6 months with me or team member.   Medication Adjustments/Labs and Tests Ordered: Current medicines are reviewed at length with the patient today.  Concerns regarding medicines are outlined above.  No orders of the defined types were placed in this encounter.  No orders of the defined types were placed in this encounter.   There are no Patient Instructions on file for this visit.   Signed, Sinclair Grooms, MD  03/02/2019 9:33 AM    [place]

## 2019-03-02 ENCOUNTER — Ambulatory Visit: Payer: Medicare Other | Admitting: Interventional Cardiology

## 2019-03-02 ENCOUNTER — Ambulatory Visit (INDEPENDENT_AMBULATORY_CARE_PROVIDER_SITE_OTHER): Payer: Medicare Other

## 2019-03-02 ENCOUNTER — Encounter: Payer: Self-pay | Admitting: Interventional Cardiology

## 2019-03-02 ENCOUNTER — Other Ambulatory Visit: Payer: Self-pay

## 2019-03-02 VITALS — BP 146/72 | HR 99 | Ht 66.0 in | Wt 154.8 lb

## 2019-03-02 DIAGNOSIS — N184 Chronic kidney disease, stage 4 (severe): Secondary | ICD-10-CM | POA: Diagnosis not present

## 2019-03-02 DIAGNOSIS — I5032 Chronic diastolic (congestive) heart failure: Secondary | ICD-10-CM

## 2019-03-02 DIAGNOSIS — I4821 Permanent atrial fibrillation: Secondary | ICD-10-CM

## 2019-03-02 DIAGNOSIS — I10 Essential (primary) hypertension: Secondary | ICD-10-CM

## 2019-03-02 DIAGNOSIS — I110 Hypertensive heart disease with heart failure: Secondary | ICD-10-CM

## 2019-03-02 DIAGNOSIS — Z5181 Encounter for therapeutic drug level monitoring: Secondary | ICD-10-CM | POA: Diagnosis not present

## 2019-03-02 DIAGNOSIS — Z7901 Long term (current) use of anticoagulants: Secondary | ICD-10-CM

## 2019-03-02 DIAGNOSIS — Z7189 Other specified counseling: Secondary | ICD-10-CM

## 2019-03-02 DIAGNOSIS — I4891 Unspecified atrial fibrillation: Secondary | ICD-10-CM

## 2019-03-02 DIAGNOSIS — E0822 Diabetes mellitus due to underlying condition with diabetic chronic kidney disease: Secondary | ICD-10-CM

## 2019-03-02 LAB — POCT INR: INR: 3.8 — AB (ref 2.0–3.0)

## 2019-03-02 MED ORDER — METOLAZONE 2.5 MG PO TABS: 2.5000 mg | ORAL_TABLET | ORAL | 3 refills | Status: DC

## 2019-03-02 NOTE — Patient Instructions (Signed)
Description   Skip today's dosage of Coumadin, then start taking 1 tablet daily except 1.5 tablets on Fridays. Recheck INR in 2 weeks. [facility]in Clinic [phone]

## 2019-03-02 NOTE — Patient Instructions (Signed)
Medication Instructions:  1) TAKE Metolazone 2.5mg  once weekly on the same day each week.  This needs to be taken 30 minutes prior to your Furosemide.   *If you need a refill on your cardiac medications before your next appointment, please call your pharmacy*  Lab Work: BMET and Pro BNP in 2 weeks  If you have labs (blood work) drawn today and your tests are completely normal, you will receive your results only by: Marland Kitchen MyChart Message (if you have MyChart) OR . A paper copy in the mail If you have any lab test that is abnormal or we need to change your treatment, we will call you to review the results.  Testing/Procedures: None  Follow-Up: At [facility], you and your health needs are our priority.  As part of our continuing mission to provide you with exceptional heart care, we have created designated Provider Care Teams.  These Care Teams include your primary Cardiologist (physician) and Advanced Practice Providers (APPs -  Physician Assistants and Nurse Practitioners) who all work together to provide you with the care you need, when you need it.  Your next appointment:   6 month(s)  The format for your next appointment:   In Person  Provider:   You may see Sinclair Grooms, MD or one of the following Advanced Practice Providers on your designated Care Team:    Truitt Merle, NP  Cecilie Kicks, NP  Kathyrn Drown, NP   Other Instructions

## 2019-03-03 ENCOUNTER — Telehealth: Payer: Self-pay

## 2019-03-03 NOTE — Telephone Encounter (Signed)
Patient called in wanting to give Dr her Blood Sugar readings   02/24/19-126 eveing 02/25/19- after breakfast 106 02/26/19- before breakfast 84  Evening - 116 02/27/19- before breakfast- 101 Evening - 110 02/28/19- after breakfast 159 Evenving - 197 03/01/19- before breakfast -107 03/02/19- Evening 167 03/03/19- before breakfast 126   Patient stated on 12/5 and 12/6 she did not take any medication also

## 2019-03-04 NOTE — Telephone Encounter (Signed)
Current blood sugars look fairly good, I presume evening means 2 hours after dinner.  Also need to confirm that her test strips are not out of date.  Currently does not appear to be starting any new medication for diabetes

## 2019-03-09 ENCOUNTER — Other Ambulatory Visit: Payer: Self-pay | Admitting: Interventional Cardiology

## 2019-03-10 ENCOUNTER — Other Ambulatory Visit: Payer: Self-pay | Admitting: Endocrinology

## 2019-03-11 ENCOUNTER — Other Ambulatory Visit: Payer: Medicare Other | Admitting: *Deleted

## 2019-03-11 ENCOUNTER — Other Ambulatory Visit: Payer: Self-pay

## 2019-03-11 ENCOUNTER — Ambulatory Visit (INDEPENDENT_AMBULATORY_CARE_PROVIDER_SITE_OTHER): Payer: Medicare Other | Admitting: Pharmacist

## 2019-03-11 DIAGNOSIS — I10 Essential (primary) hypertension: Secondary | ICD-10-CM | POA: Diagnosis not present

## 2019-03-11 DIAGNOSIS — N184 Chronic kidney disease, stage 4 (severe): Secondary | ICD-10-CM | POA: Diagnosis not present

## 2019-03-11 DIAGNOSIS — I4821 Permanent atrial fibrillation: Secondary | ICD-10-CM | POA: Diagnosis not present

## 2019-03-11 DIAGNOSIS — I4891 Unspecified atrial fibrillation: Secondary | ICD-10-CM | POA: Diagnosis not present

## 2019-03-11 DIAGNOSIS — Z5181 Encounter for therapeutic drug level monitoring: Secondary | ICD-10-CM

## 2019-03-11 DIAGNOSIS — I5032 Chronic diastolic (congestive) heart failure: Secondary | ICD-10-CM | POA: Diagnosis not present

## 2019-03-11 LAB — POCT INR: INR: 1.9 — AB (ref 2.0–3.0)

## 2019-03-11 NOTE — Patient Instructions (Signed)
Description   Continue taking 1 tablet daily except 1.5 tablets on Fridays. Recheck INR in 3 weeks. [facility] [phone]

## 2019-03-12 LAB — BASIC METABOLIC PANEL
BUN/Creatinine Ratio: 22 (ref 12–28)
BUN: 48 mg/dL — ABNORMAL HIGH (ref 8–27)
CO2: 24 mmol/L (ref 20–29)
Calcium: 9.7 mg/dL (ref 8.7–10.3)
Chloride: 99 mmol/L (ref 96–106)
Creatinine, Ser: 2.21 mg/dL — ABNORMAL HIGH (ref 0.57–1.00)
GFR calc Af Amer: 23 mL/min/{1.73_m2} — ABNORMAL LOW (ref >59)
GFR calc non Af Amer: 20 mL/min/{1.73_m2} — ABNORMAL LOW (ref >59)
Glucose: 140 mg/dL — ABNORMAL HIGH (ref 65–99)
Potassium: 3.5 mmol/L (ref 3.5–5.2)
Sodium: 139 mmol/L (ref 134–144)

## 2019-03-12 LAB — PRO B NATRIURETIC PEPTIDE: NT-Pro BNP: 19319 pg/mL — ABNORMAL HIGH (ref 0–738)

## 2019-03-30 ENCOUNTER — Other Ambulatory Visit: Payer: Self-pay

## 2019-03-30 ENCOUNTER — Encounter: Payer: Self-pay | Admitting: Podiatry

## 2019-03-30 ENCOUNTER — Ambulatory Visit (INDEPENDENT_AMBULATORY_CARE_PROVIDER_SITE_OTHER): Payer: Medicare Other | Admitting: Podiatry

## 2019-03-30 ENCOUNTER — Ambulatory Visit (INDEPENDENT_AMBULATORY_CARE_PROVIDER_SITE_OTHER): Payer: Medicare Other | Admitting: *Deleted

## 2019-03-30 DIAGNOSIS — I4891 Unspecified atrial fibrillation: Secondary | ICD-10-CM

## 2019-03-30 DIAGNOSIS — B351 Tinea unguium: Secondary | ICD-10-CM | POA: Diagnosis not present

## 2019-03-30 DIAGNOSIS — M79675 Pain in left toe(s): Secondary | ICD-10-CM | POA: Diagnosis not present

## 2019-03-30 DIAGNOSIS — M79674 Pain in right toe(s): Secondary | ICD-10-CM

## 2019-03-30 DIAGNOSIS — L84 Corns and callosities: Secondary | ICD-10-CM

## 2019-03-30 DIAGNOSIS — Z5181 Encounter for therapeutic drug level monitoring: Secondary | ICD-10-CM | POA: Diagnosis not present

## 2019-03-30 DIAGNOSIS — I4821 Permanent atrial fibrillation: Secondary | ICD-10-CM

## 2019-03-30 DIAGNOSIS — E1151 Type 2 diabetes mellitus with diabetic peripheral angiopathy without gangrene: Secondary | ICD-10-CM | POA: Diagnosis not present

## 2019-03-30 LAB — POCT INR: INR: 3.3 — AB (ref 2.0–3.0)

## 2019-03-30 NOTE — Patient Instructions (Signed)
Description   Hold today's dose then continue taking 1 tablet daily except 1.5 tablets on Fridays. Recheck INR in 3 weeks. [facility] [phone]

## 2019-03-30 NOTE — Patient Instructions (Signed)
Diabetes Mellitus and Foot Care Foot care is an important part of your health, especially when you have diabetes. Diabetes may cause you to have problems because of poor blood flow (circulation) to your feet and legs, which can cause your skin to:  Become thinner and drier.  Break more easily.  Heal more slowly.  Peel and crack. You may also have nerve damage (neuropathy) in your legs and feet, causing decreased feeling in them. This means that you may not notice minor injuries to your feet that could lead to more serious problems. Noticing and addressing any potential problems early is the best way to prevent future foot problems. How to care for your feet Foot hygiene  Wash your feet daily with warm water and mild soap. Do not use hot water. Then, pat your feet and the areas between your toes until they are completely dry. Do not soak your feet as this can dry your skin.  Trim your toenails straight across. Do not dig under them or around the cuticle. File the edges of your nails with an emery board or nail file.  Apply a moisturizing lotion or petroleum jelly to the skin on your feet and to dry, brittle toenails. Use lotion that does not contain alcohol and is unscented. Do not apply lotion between your toes. Shoes and socks  Wear clean socks or stockings every day. Make sure they are not too tight. Do not wear knee-high stockings since they may decrease blood flow to your legs.  Wear shoes that fit properly and have enough cushioning. Always look in your shoes before you put them on to be sure there are no objects inside.  To break in new shoes, wear them for just a few hours a day. This prevents injuries on your feet. Wounds, scrapes, corns, and calluses  Check your feet daily for blisters, cuts, bruises, sores, and redness. If you cannot see the bottom of your feet, use a mirror or ask someone for help.  Do not cut corns or calluses or try to remove them with medicine.  If you  find a minor scrape, cut, or break in the skin on your feet, keep it and the skin around it clean and dry. You may clean these areas with mild soap and water. Do not clean the area with peroxide, alcohol, or iodine.  If you have a wound, scrape, corn, or callus on your foot, look at it several times a day to make sure it is healing and not infected. Check for: ? Redness, swelling, or pain. ? Fluid or blood. ? Warmth. ? Pus or a bad smell. General instructions  Do not cross your legs. This may decrease blood flow to your feet.  Do not use heating pads or hot water bottles on your feet. They may burn your skin. If you have lost feeling in your feet or legs, you may not know this is happening until it is too late.  Protect your feet from hot and cold by wearing shoes, such as at the beach or on hot pavement.  Schedule a complete foot exam at least once a year (annually) or more often if you have foot problems. If you have foot problems, report any cuts, sores, or bruises to your health care provider immediately. Contact a health care provider if:  You have a medical condition that increases your risk of infection and you have any cuts, sores, or bruises on your feet.  You have an injury that is not   healing.  You have redness on your legs or feet.  You feel burning or tingling in your legs or feet.  You have pain or cramps in your legs and feet.  Your legs or feet are numb.  Your feet always feel cold.  You have pain around a toenail. Get help right away if:  You have a wound, scrape, corn, or callus on your foot and: ? You have pain, swelling, or redness that gets worse. ? You have fluid or blood coming from the wound, scrape, corn, or callus. ? Your wound, scrape, corn, or callus feels warm to the touch. ? You have pus or a bad smell coming from the wound, scrape, corn, or callus. ? You have a fever. ? You have a red line going up your leg. Summary  Check your feet every day  for cuts, sores, red spots, swelling, and blisters.  Moisturize feet and legs daily.  Wear shoes that fit properly and have enough cushioning.  If you have foot problems, report any cuts, sores, or bruises to your health care provider immediately.  Schedule a complete foot exam at least once a year (annually) or more often if you have foot problems. This information is not intended to replace advice given to you by your health care provider. Make sure you discuss any questions you have with your health care provider. Document Revised: 12/01/2018 Document Reviewed: 04/11/2016 Elsevier Patient Education  2020 Elsevier Inc.  

## 2019-04-03 ENCOUNTER — Encounter: Payer: Self-pay | Admitting: Podiatry

## 2019-04-03 NOTE — Progress Notes (Signed)
Subjective: [name] is a 86 y.o. y.o. female who presents for preventative foot care today with diabetes and PAD. She is followed for painful mycotic toenails which interfere with daily activities. Pain is aggravated when wearing enclosed shoe gear. Pain is relieved with periodic professional debridement.  Rankins, Bill Salinas, MD is her PCP.   Current Outpatient Medications on File Prior to Visit  Medication Sig Dispense Refill  . warfarin (COUMADIN) 5 MG tablet TAKE AS DIRECTED BY COUMADIN CLINIC 120 tablet 1  . acidophilus (RISAQUAD) CAPS capsule Take 1 capsule by mouth daily as needed (GI issues).     Marland Kitchen atorvastatin (LIPITOR) 40 MG tablet Take 40 mg by mouth every evening.     . B-D UF III MINI PEN NEEDLES 31G X 5 MM MISC USE 3 PEN NEEDLES PER DAY 100 each 1  . colchicine 0.6 MG tablet Take 0.6 mg by mouth daily.     . Cranberry 250 MG CAPS Take 750 mg by mouth daily.     Marland Kitchen diltiazem (CARDIZEM CD) 180 MG 24 hr capsule Take 1 capsule (180 mg total) by mouth daily. 30 capsule 11  . econazole nitrate 1 % cream Apply 1 application topically daily as needed (to groin rash).     Marland Kitchen esomeprazole (NEXIUM) 20 MG capsule Take 20 mg by mouth daily before breakfast.     . ferrous sulfate 325 (65 FE) MG tablet Take 325 mg by mouth daily with breakfast.    . fluticasone (FLONASE) 50 MCG/ACT nasal spray Place 2 sprays into both nostrils daily as needed for rhinitis.     . furosemide (LASIX) 80 MG tablet Take 1.5 tablets (120mg  total) by mouth every morning.  Take one tablet by mouth every evening. 180 tablet 3  . hydrALAZINE (APRESOLINE) 25 MG tablet TAKE 3 TABLETS (75 MG TOTAL) BY MOUTH 3 (THREE) TIMES DAILY. 810 tablet 3  . liraglutide (VICTOZA) 18 MG/3ML SOPN INJECT 1.2MG  SUBCUTANEOUSLY DAILY. 18 mL 1  . metolazone (ZAROXOLYN) 2.5 MG tablet Take 1 tablet (2.5 mg total) by mouth once a week. 5 tablet 3  . metoprolol tartrate (LOPRESSOR) 100 MG tablet TAKE 1 TABLET BY MOUTH TWICE A DAY 180 tablet 3   . ONETOUCH VERIO test strip USE 1 STRIP TO CHECK GLUCOSE TWICE DAILY AS DIRECTED 100 each 0  . polyethylene glycol (MIRALAX / GLYCOLAX) packet Take 17 g by mouth daily as needed for mild constipation.    . potassium chloride (KLOR-CON) 10 MEQ tablet TAKE 3 TABLETS BY MOUTH EVERY MORNING AND TAKE 3 TABLETS BY MOUTH EVERY EVENING 180 tablet 10   No current facility-administered medications on file prior to visit.    Allergies  Allergen Reactions  . Lotensin [Benazepril Hcl] Anaphylaxis  . Zantac [Ranitidine Hcl] Anaphylaxis  . Tape Itching and Rash   Objective: There were no vitals filed for this visit.  Vascular Examination: Capillary refill time <4 seconds x 10 digits  Dorsalis pedis pulses absent b/l.  Posterior tibial pulses absent b/l.  No digital hair b/l.   +Localized edema dorsal midfoot b/l.   Skin temperature gradient WNL b/l.   Dermatological Examination: Skin with dry, flaky skin dorsally b/l.   Toenails 1-5 b/l discolored, thick, dystrophic with subungual debris and pain with palpation to nailbeds due to thickness of nails.  Hyperkeratotic lesion right hallux. No edema, no erythema, no drainage, no flocculence.  Musculoskeletal: Muscle strength 5/5 to all LE muscle groups b/l.   Pes planus foot type b/l.  No crepitus or joint pain with passive/active ROM b/l.   Neurological: Sensation intact b/l with 10 gram monofilament.  Assessment: 1. Painful onychomycosis toenails 1-5 b/l 2. Callus right hallux 3. Xerosis b/l  4. NIDDM with peripheral arterial disease  Plan: 1. Continue diabetic foot care principles. Literature dispensed. 2. Toenails 1-5 b/l were debrided in length and girth without iatrogenic bleeding. 3. Discussed emollients such as Aquaphor for xerosis. Apply once daily.  4. Hyperkeratotic lesion(s) right hallux pared with sterile chisel blade and gently smoothed with burr. 5. Patient to continue soft, supportive shoe gear  daily. 6. Patient to report any pedal injuries to medical professional immediately. 7. Follow up 3 months.  8. Patient/POA to call should there be a concern in the interim.

## 2019-04-05 ENCOUNTER — Other Ambulatory Visit: Payer: Self-pay | Admitting: Interventional Cardiology

## 2019-04-22 ENCOUNTER — Ambulatory Visit (INDEPENDENT_AMBULATORY_CARE_PROVIDER_SITE_OTHER): Payer: Medicare Other | Admitting: *Deleted

## 2019-04-22 ENCOUNTER — Other Ambulatory Visit: Payer: Self-pay

## 2019-04-22 DIAGNOSIS — Z5181 Encounter for therapeutic drug level monitoring: Secondary | ICD-10-CM

## 2019-04-22 DIAGNOSIS — I4891 Unspecified atrial fibrillation: Secondary | ICD-10-CM

## 2019-04-22 DIAGNOSIS — I4821 Permanent atrial fibrillation: Secondary | ICD-10-CM

## 2019-04-22 LAB — POCT INR: INR: 2.2 (ref 2.0–3.0)

## 2019-04-22 NOTE — Patient Instructions (Signed)
Description   Continue taking 1 tablet daily except 1.5 tablets on Fridays. Recheck INR in 4 weeks. [facility] [phone]

## 2019-05-20 ENCOUNTER — Ambulatory Visit (INDEPENDENT_AMBULATORY_CARE_PROVIDER_SITE_OTHER): Payer: Medicare Other

## 2019-05-20 ENCOUNTER — Other Ambulatory Visit: Payer: Self-pay

## 2019-05-20 DIAGNOSIS — D509 Iron deficiency anemia, unspecified: Secondary | ICD-10-CM | POA: Diagnosis not present

## 2019-05-20 DIAGNOSIS — Z5181 Encounter for therapeutic drug level monitoring: Secondary | ICD-10-CM

## 2019-05-20 DIAGNOSIS — I4821 Permanent atrial fibrillation: Secondary | ICD-10-CM

## 2019-05-20 DIAGNOSIS — I4891 Unspecified atrial fibrillation: Secondary | ICD-10-CM

## 2019-05-20 DIAGNOSIS — I10 Essential (primary) hypertension: Secondary | ICD-10-CM | POA: Diagnosis not present

## 2019-05-20 DIAGNOSIS — N184 Chronic kidney disease, stage 4 (severe): Secondary | ICD-10-CM | POA: Diagnosis not present

## 2019-05-20 DIAGNOSIS — E785 Hyperlipidemia, unspecified: Secondary | ICD-10-CM | POA: Diagnosis not present

## 2019-05-20 LAB — POCT INR: INR: 2.7 (ref 2.0–3.0)

## 2019-05-20 NOTE — Patient Instructions (Signed)
Description   Continue taking 1 tablet daily except 1.5 tablets on Fridays. Recheck INR in 5 weeks. [facility] [phone]

## 2019-05-26 ENCOUNTER — Ambulatory Visit: Payer: Medicare Other | Admitting: Endocrinology

## 2019-06-09 ENCOUNTER — Other Ambulatory Visit: Payer: Self-pay

## 2019-06-13 ENCOUNTER — Ambulatory Visit: Payer: Medicare Other | Admitting: Endocrinology

## 2019-06-13 ENCOUNTER — Encounter: Payer: Self-pay | Admitting: Endocrinology

## 2019-06-13 ENCOUNTER — Other Ambulatory Visit: Payer: Self-pay

## 2019-06-13 VITALS — BP 120/70 | HR 63 | Ht 66.0 in | Wt 146.6 lb

## 2019-06-13 DIAGNOSIS — E1165 Type 2 diabetes mellitus with hyperglycemia: Secondary | ICD-10-CM

## 2019-06-13 DIAGNOSIS — E7800 Pure hypercholesterolemia, unspecified: Secondary | ICD-10-CM | POA: Diagnosis not present

## 2019-06-13 DIAGNOSIS — N184 Chronic kidney disease, stage 4 (severe): Secondary | ICD-10-CM

## 2019-06-13 DIAGNOSIS — Z794 Long term (current) use of insulin: Secondary | ICD-10-CM | POA: Diagnosis not present

## 2019-06-13 LAB — CBC WITH DIFFERENTIAL/PLATELET
Basophils Absolute: 0 10*3/uL (ref 0.0–0.1)
Basophils Relative: 0.7 % (ref 0.0–3.0)
Eosinophils Absolute: 0.2 10*3/uL (ref 0.0–0.7)
Eosinophils Relative: 2.8 % (ref 0.0–5.0)
HCT: 36.2 % (ref 36.0–46.0)
Hemoglobin: 12.1 g/dL (ref 12.0–15.0)
Lymphocytes Relative: 18.5 % (ref 12.0–46.0)
Lymphs Abs: 1.1 10*3/uL (ref 0.7–4.0)
MCHC: 33.4 g/dL (ref 30.0–36.0)
MCV: 83.1 fl (ref 78.0–100.0)
Monocytes Absolute: 0.7 10*3/uL (ref 0.1–1.0)
Monocytes Relative: 11.6 % (ref 3.0–12.0)
Neutro Abs: 3.9 10*3/uL (ref 1.4–7.7)
Neutrophils Relative %: 66.4 % (ref 43.0–77.0)
Platelets: 195 10*3/uL (ref 150.0–400.0)
RBC: 4.35 Mil/uL (ref 3.87–5.11)
RDW: 14.3 % (ref 11.5–15.5)
WBC: 5.9 10*3/uL (ref 4.0–10.5)

## 2019-06-13 LAB — LIPID PANEL
Cholesterol: 92 mg/dL (ref 0–200)
HDL: 46.8 mg/dL (ref >39.00)
LDL Cholesterol: 32 mg/dL (ref 0–99)
NonHDL: 45.37
Total CHOL/HDL Ratio: 2
Triglycerides: 66 mg/dL (ref 0.0–149.0)
VLDL: 13.2 mg/dL (ref 0.0–40.0)

## 2019-06-13 LAB — COMPREHENSIVE METABOLIC PANEL
ALT: 25 U/L (ref 0–35)
AST: 30 U/L (ref 0–37)
Albumin: 3.4 g/dL — ABNORMAL LOW (ref 3.5–5.2)
Alkaline Phosphatase: 40 U/L (ref 39–117)
BUN: 57 mg/dL — ABNORMAL HIGH (ref 6–23)
CO2: 29 mEq/L (ref 19–32)
Calcium: 9.3 mg/dL (ref 8.4–10.5)
Chloride: 101 mEq/L (ref 96–112)
Creatinine, Ser: 1.9 mg/dL — ABNORMAL HIGH (ref 0.40–1.20)
GFR: 30.5 mL/min — ABNORMAL LOW (ref >60.00)
Glucose, Bld: 123 mg/dL — ABNORMAL HIGH (ref 70–99)
Potassium: 2.9 mEq/L — ABNORMAL LOW (ref 3.5–5.1)
Sodium: 139 mEq/L (ref 135–145)
Total Bilirubin: 0.7 mg/dL (ref 0.2–1.2)
Total Protein: 6.9 g/dL (ref 6.0–8.3)

## 2019-06-13 LAB — POCT GLYCOSYLATED HEMOGLOBIN (HGB A1C): Hemoglobin A1C: 6.4 % — AB (ref 4.0–5.6)

## 2019-06-13 NOTE — Patient Instructions (Addendum)
Check blood sugars on waking up 1 days a week  Also check blood sugars about 2 hours after meals and do this after different meals by rotation  Recommended blood sugar levels on waking up are 90-130 and about 2 hours after meal is 130-180  Please bring your blood sugar monitor to each visit, thank you  Reduce Victoza to 0.6mg  daily  You may register to get the vaccine at [facility] website  [email or web address]  You can also go directly to any of the following options:  1.  [facility] website   [email or web address]  Telephone number: [phone], Option 2   2.  FEMA program at the [employer].org   3.  You can register at the [facility] website   DayTransfer.[email or web address] or call [phone]   4.  Also can check on [email or web address]

## 2019-06-13 NOTE — Progress Notes (Signed)
Patient ID: [name], female   DOB: October 30, 1933, 86 y.o.   MRN: [id number]    Reason for Appointment: Follow-up for Type 2 Diabetes  Referring physician: Jordan Hawks Rankins  History of Present Illness:          Diagnosis: Type 2 diabetes mellitus, date of diagnosis: ? 52 years ago        Past history: She was initially treated with Metformin but this was done because it caused GI side effects  May also tried Actos at some point, probably started because of the fear of side effects but did not know if it helped Actos stopped ? Reason Over the last few years she has been taking Precose with meals and also Januvia in increasing doses Apparently her blood sugars have been higher since 9/14 when she had abdominal surgery and before that they were as low as 70. On her initial consultation in 6/15 because of her renal dysfunction her medication regimen was changed. Previously was taking Januvia, Precose and glipizide without adequate control; her blood sugars were occasionally over 200 fasting and A1c was 9.4 She was started on Victoza 0.6 mg and Lantus insulin 8 units a day but the Lantus had to be stopped because of relatively low fasting glucose.  Since she had tried Prandin and glipizide and her postprandial readings were as high as 330 she was started on Humalog at mealtimes in 8/15  Recent history:    Current diabetes treatment regimen: Victoza 1.2 mg daily  Her A1c is the same at 6.4 although it has been as low as 5.8 previously   Current blood sugar patterns, current management  and problems identified:  She has again checked her blood sugars sporadically and mostly in the afternoon  She thinks her blood sugar 174 last Friday was after eating more carbohydrates with spaghetti, she gets Meals on Wheels in the evenings  She appears to have lost about 9 pounds although she thinks her appetite is improving now  Lab glucose was 140 in December and no recent labs  available  She is usually having some carbohydrates at all meals  Taking Victoza consistently every morning but she is having difficulty using the pen for injection because of her joint pains  She has a relatively large breakfast, sometimes high in fat   Usually eating a 1/2 sandwich at lunch, sometimes less       Oral hypoglycemic drugs the patient is taking are: none  Side effects from medications have been: None   Glucose monitoring:  done once  a day        Glucometer:  One Touch Verio Blood sugars from download:  MORNING 105, 108; after 12 noon: 103-148 Bedtime 174 with overall AVERAGE 127   Previous readings ranging from 79-145, mostly done at bedtime     Self-care:   Meals: 3 meals per day.   Breakfast: eggs, grits,meat;  sometimes 1/2 Boost; will have spaghetti or rice at supper. Dinner 6 pm           Exercise:  none, unable to          Dietician visit: Most recent: Unknown.               Weight history:  Wt Readings from Last 3 Encounters:  06/13/19 146 lb 9.6 oz (66.5 kg)  03/02/19 154 lb 12.8 oz (70.2 kg)  02/24/19 155 lb 3.2 oz (70.4 kg)   Glycemic control:  Lab Results  Component Value  Date   HGBA1C 6.4 (A) 06/13/2019   HGBA1C 6.4 (A) 02/24/2019   HGBA1C 6.4 (A) 03/09/2018   Lab Results  Component Value Date   MICROALBUR 132.3 (H) 06/18/2017   LDLCALC 36 07/18/2016   CREATININE 2.21 (H) 03/11/2019     Allergies as of 06/13/2019      Reactions   Lotensin [benazepril Hcl] Anaphylaxis   Zantac [ranitidine Hcl] Anaphylaxis   Tape Itching, Rash      Medication List       Accurate as of June 13, 2019 12:48 PM. If you have any questions, ask your nurse or doctor.        acidophilus Caps capsule Take 1 capsule by mouth daily as needed (GI issues).   atorvastatin 40 MG tablet Commonly known as: LIPITOR Take 40 mg by mouth every evening.   B-D UF III MINI PEN NEEDLES 31G X 5 MM Misc Generic drug: Insulin Pen Needle USE 3 PEN NEEDLES PER  DAY   colchicine 0.6 MG tablet Take 0.6 mg by mouth daily.   Cranberry 250 MG Caps Take 750 mg by mouth daily.   diltiazem 180 MG 24 hr capsule Commonly known as: CARDIZEM CD TAKE 1 CAPSULE BY MOUTH EVERY DAY   econazole nitrate 1 % cream Apply 1 application topically daily as needed (to groin rash).   esomeprazole 20 MG capsule Commonly known as: NEXIUM Take 20 mg by mouth daily before breakfast.   ferrous sulfate 325 (65 FE) MG tablet Take 325 mg by mouth daily with breakfast.   fluticasone 50 MCG/ACT nasal spray Commonly known as: FLONASE Place 2 sprays into both nostrils daily as needed for rhinitis.   furosemide 80 MG tablet Commonly known as: LASIX Take 1.5 tablets (120mg  total) by mouth every morning.  Take one tablet by mouth every evening.   hydrALAZINE 25 MG tablet Commonly known as: APRESOLINE TAKE 3 TABLETS (75 MG TOTAL) BY MOUTH 3 (THREE) TIMES DAILY.   metolazone 2.5 MG tablet Commonly known as: ZAROXOLYN Take 1 tablet (2.5 mg total) by mouth once a week.   metoprolol tartrate 100 MG tablet Commonly known as: LOPRESSOR TAKE 1 TABLET BY MOUTH TWICE A DAY   OneTouch Verio test strip Generic drug: glucose blood USE 1 STRIP TO CHECK GLUCOSE TWICE DAILY AS DIRECTED   polyethylene glycol 17 g packet Commonly known as: MIRALAX / GLYCOLAX Take 17 g by mouth daily as needed for mild constipation.   potassium chloride 10 MEQ tablet Commonly known as: KLOR-CON TAKE 3 TABLETS BY MOUTH EVERY MORNING AND TAKE 3 TABLETS BY MOUTH EVERY EVENING   Victoza 18 MG/3ML Sopn Generic drug: liraglutide INJECT 1.2MG  SUBCUTANEOUSLY DAILY.   warfarin 5 MG tablet Commonly known as: COUMADIN Take as directed by the anticoagulation clinic. If you are unsure how to take this medication, talk to your nurse or doctor. Original instructions: TAKE AS DIRECTED BY COUMADIN CLINIC       Allergies:  Allergies  Allergen Reactions  . Lotensin [Benazepril Hcl] Anaphylaxis  .  Zantac [Ranitidine Hcl] Anaphylaxis  . Tape Itching and Rash    Past Medical History:  Diagnosis Date  . Anemia due to blood loss, chronic 08/23/2013  . Atrial fibrillation ([place]) 12/09/2012  . Candidal skin infection 12/23/2012  . Chronic diastolic heart failure ([place]) 08/23/2013  . Chronic kidney disease, stage IV (severe) ([place]) 12/09/2012  . Diabetes mellitus with renal complications ([place]) 56/05/1495  . Diabetes mellitus without complication ([place])   . Dyslipidemia 12/09/2012  .  Fall at home Sept. 3, 2016   Fx  3 ribs  . GERD (gastroesophageal reflux disease) 12/23/2012  . HTN (hypertension) 12/09/2012  . Hyperlipidemia 12/23/2012  . Hypertension   . Ileus, postoperative ([place]) 12/17/2012  . Intractable pain 12/03/2014  . Irregular heart beat   . Long term current use of anticoagulant therapy 02/22/2013  . On amiodarone therapy 02/22/2013  . Peptic esophagitis 08/23/2013  . Pressure injury of skin 05/21/2016  . Renal disorder   . Type II diabetes mellitus, uncontrolled ([place]) 09/06/2013  . Umbilical hernia, incarcerated - reducible 12/10/2012    Past Surgical History:  Procedure Laterality Date  . BREAST LUMPECTOMY Right 2001   2 lumpectomies with radiation both 2001  . CATARACT EXTRACTION, BILATERAL    . HERNIA REPAIR    . I & D EXTREMITY Right 05/19/2016   Procedure: MINOR IRRIGATION AND DEBRIDEMENT Right great toe;  Surgeon: Gaynelle Arabian, MD;  Location: [facility];  Service: Orthopedics;  Laterality: Right;  . INSERTION OF MESH N/A 12/12/2012   Procedure: INSERTION OF MESH;  Surgeon: Madilyn Hook, DO;  Location: [facility];  Service: General;  Laterality: N/A;  . LAPAROSCOPIC LYSIS OF ADHESIONS N/A 12/12/2012   Procedure: LAPAROSCOPIC LYSIS OF ADHESIONS;  Surgeon: Madilyn Hook, DO;  Location: [facility];  Service: General;  Laterality: N/A;  . Right Lumpectomy    . VENTRAL HERNIA REPAIR N/A 12/12/2012   Procedure: LAPAROSCOPIC VENTRAL HERNIA;  Surgeon: Madilyn Hook, DO;  Location: [facility];  Service:  General;  Laterality: N/A;    Family History  Problem Relation Age of Onset  . Diabetes Mother   . Diabetes Sister     Social History:  reports that she has quit smoking. Her smoking use included cigarettes. She has never used smokeless tobacco. She reports that she does not drink alcohol or use drugs.    Review of Systems        Lipids: She has been on 40 mg dose Lipitor for hyperlipidemia Her last LDL was 52 done in 04/2017 This is followed at other practices, no recent labs available      Lab Results  Component Value Date   CHOL 112 07/18/2016   HDL 53.70 07/18/2016   LDLCALC 36 07/18/2016   LDLDIRECT 37.8 10/27/2013   TRIG 111.0 07/18/2016   CHOLHDL 2 07/18/2016       The blood pressure has been treated with multiple medications including 10 mg amlodipine  Followed by other physicians Including cardiologist and nephrologist   BP Readings from Last 3 Encounters:  06/13/19 120/70  03/02/19 (!) 146/72  02/24/19 138/70       She has had history of swelling of legs and is on 80 mg Lasix      Chronic kidney disease followed by nephrologist   Lab Results  Component Value Date   CREATININE 2.21 (H) 03/11/2019   Last diabetic foot exam in 9/20 with podiatrist  She has weakness in her legs since 2014; needs a walker and cannot walk  She is reluctant to take the Covid vaccine but reassured her about the safety and stressed importance of taking this because of her high risk status, she says her family is not wanting her to take it at this time  Physical Examination:  BP 120/70   Pulse 63   Ht 5\' 6"  (1.676 m)   Wt 146 lb 9.6 oz (66.5 kg)   SpO2 99%   BMI 23.66 kg/m     ASSESSMENT:  Diabetes type  2, long-standing  See history of present illness for detailed discussion of his current management, blood sugar patterns and problems identified  She has a stable and excellent A1c at 6.4  Considering her age and comorbid conditions her blood sugar control is  still excellent She has however lost weight and not clear why She thinks recently her appetite is improving Not able to verify what her postprandial readings are at home since she does not monitor in the evenings after her main meal Also recently complaining of difficulty in using the pen   RENAL dysfunction: Followed by nephrologist, no recent records available   HYPERTENSION: Her blood pressure is excellent today   PLAN:   She will start checking sugars 2 hours after breakfast or dinner If her blood sugars are consistently high in the evening she will let us know Because of her weight loss we will reduce her Victoza down to 0.6 mg daily Her daughter will help her to the injection every afternoon Labs to be checked today including lipids  Given her information on how to schedule her Covid vaccine and strongly encouraged her to do so.  Her daughter was also present in the office Did not have any further questions    Patient Instructions  Check blood sugars on waking up 1 days a week  Also check blood sugars about 2 hours after meals and do this after different meals by rotation  Recommended blood sugar levels on waking up are 90-130 and about 2 hours after meal is 130-180  Please bring your blood sugar monitor to each visit, thank you  Reduce Victoza to 0.6mg  daily  You may register to get the vaccine at [facility] website  [email or web address]  You can also go directly to any of the following options:  1.  [place] website   [email or web address]  Telephone number: [phone], Option 2   2.  FEMA program at the [email or web address].[email or web address]   3.  You can register at the [place] website   [email or web address] or call [phone]   4.  Also can check on [email or web address]       Elayne Snare 06/13/2019, 12:48 PM   Note: This office note was prepared with Dragon voice  recognition system technology. Any transcriptional errors that result from this process are unintentional.

## 2019-06-21 ENCOUNTER — Telehealth: Payer: Self-pay | Admitting: *Deleted

## 2019-06-21 DIAGNOSIS — I4891 Unspecified atrial fibrillation: Secondary | ICD-10-CM | POA: Diagnosis not present

## 2019-06-21 DIAGNOSIS — I10 Essential (primary) hypertension: Secondary | ICD-10-CM | POA: Diagnosis not present

## 2019-06-21 DIAGNOSIS — N184 Chronic kidney disease, stage 4 (severe): Secondary | ICD-10-CM | POA: Diagnosis not present

## 2019-06-21 DIAGNOSIS — I5032 Chronic diastolic (congestive) heart failure: Secondary | ICD-10-CM

## 2019-06-21 DIAGNOSIS — D509 Iron deficiency anemia, unspecified: Secondary | ICD-10-CM | POA: Diagnosis not present

## 2019-06-21 MED ORDER — SPIRONOLACTONE 25 MG PO TABS: 12.5000 mg | ORAL_TABLET | ORAL | 3 refills | Status: DC

## 2019-06-21 NOTE — Telephone Encounter (Signed)
Spoke with pt and reviewed results and recommendations.  She will come for labs on 4/9.

## 2019-06-21 NOTE — Telephone Encounter (Signed)
-----   Message from Belva Crome, MD sent at 06/16/2019  1:03 PM EDT ----- Let the patient know the potassium level is low.  Start Aldactone 12.5 mg on Monday Wednesday and Friday.  Basic metabolic panel in 10 days. A copy will be sent to Rankins, Bill Salinas, MD

## 2019-06-24 ENCOUNTER — Ambulatory Visit (INDEPENDENT_AMBULATORY_CARE_PROVIDER_SITE_OTHER): Payer: Medicare Other

## 2019-06-24 ENCOUNTER — Other Ambulatory Visit: Payer: Self-pay

## 2019-06-24 DIAGNOSIS — Z5181 Encounter for therapeutic drug level monitoring: Secondary | ICD-10-CM | POA: Diagnosis not present

## 2019-06-24 DIAGNOSIS — I4891 Unspecified atrial fibrillation: Secondary | ICD-10-CM | POA: Diagnosis not present

## 2019-06-24 DIAGNOSIS — I4821 Permanent atrial fibrillation: Secondary | ICD-10-CM | POA: Diagnosis not present

## 2019-06-24 LAB — POCT INR: INR: 2.1 (ref 2.0–3.0)

## 2019-06-24 NOTE — Patient Instructions (Signed)
Description   Continue taking 1 tablet daily except 1.5 tablets on Fridays. Recheck INR in 6 weeks. [facility] [phone]

## 2019-06-25 ENCOUNTER — Ambulatory Visit: Payer: Medicare Other | Attending: Internal Medicine

## 2019-06-25 DIAGNOSIS — Z23 Encounter for immunization: Secondary | ICD-10-CM

## 2019-06-25 NOTE — Progress Notes (Signed)
   Covid-19 Vaccination Clinic  Name:  [name]    MRN: [id number] DOB: 08-11-1933  06/25/2019  [name] was observed post Covid-19 immunization for 15 minutes without incident. She was provided with Vaccine Information Sheet and instruction to access the V-Safe system.   [name] was instructed to call 911 with any severe reactions post vaccine: Marland Kitchen Difficulty breathing  . Swelling of face and throat  . A fast heartbeat  . A bad rash all over body  . Dizziness and weakness   Immunizations Administered    Name Date Dose VIS Date Route   Moderna COVID-19 Vaccine 06/25/2019 11:22 AM 0.5 mL 02/22/2019 Intramuscular   Manufacturer: Moderna   Lot: [id number]   [place]: [id number]

## 2019-06-29 ENCOUNTER — Other Ambulatory Visit: Payer: Self-pay

## 2019-06-29 ENCOUNTER — Ambulatory Visit (INDEPENDENT_AMBULATORY_CARE_PROVIDER_SITE_OTHER): Payer: Medicare Other | Admitting: Podiatry

## 2019-06-29 ENCOUNTER — Encounter: Payer: Self-pay | Admitting: Podiatry

## 2019-06-29 VITALS — Temp 97.1°F

## 2019-06-29 DIAGNOSIS — E1151 Type 2 diabetes mellitus with diabetic peripheral angiopathy without gangrene: Secondary | ICD-10-CM

## 2019-06-29 DIAGNOSIS — B351 Tinea unguium: Secondary | ICD-10-CM | POA: Diagnosis not present

## 2019-06-29 DIAGNOSIS — M79675 Pain in left toe(s): Secondary | ICD-10-CM | POA: Diagnosis not present

## 2019-06-29 DIAGNOSIS — L84 Corns and callosities: Secondary | ICD-10-CM

## 2019-06-29 DIAGNOSIS — M79674 Pain in right toe(s): Secondary | ICD-10-CM | POA: Diagnosis not present

## 2019-06-29 NOTE — Patient Instructions (Signed)
Diabetes Mellitus and Foot Care Foot care is an important part of your health, especially when you have diabetes. Diabetes may cause you to have problems because of poor blood flow (circulation) to your feet and legs, which can cause your skin to:  Become thinner and drier.  Break more easily.  Heal more slowly.  Peel and crack. You may also have nerve damage (neuropathy) in your legs and feet, causing decreased feeling in them. This means that you may not notice minor injuries to your feet that could lead to more serious problems. Noticing and addressing any potential problems early is the best way to prevent future foot problems. How to care for your feet Foot hygiene  Wash your feet daily with warm water and mild soap. Do not use hot water. Then, pat your feet and the areas between your toes until they are completely dry. Do not soak your feet as this can dry your skin.  Trim your toenails straight across. Do not dig under them or around the cuticle. File the edges of your nails with an emery board or nail file.  Apply a moisturizing lotion or petroleum jelly to the skin on your feet and to dry, brittle toenails. Use lotion that does not contain alcohol and is unscented. Do not apply lotion between your toes. Shoes and socks  Wear clean socks or stockings every day. Make sure they are not too tight. Do not wear knee-high stockings since they may decrease blood flow to your legs.  Wear shoes that fit properly and have enough cushioning. Always look in your shoes before you put them on to be sure there are no objects inside.  To break in new shoes, wear them for just a few hours a day. This prevents injuries on your feet. Wounds, scrapes, corns, and calluses  Check your feet daily for blisters, cuts, bruises, sores, and redness. If you cannot see the bottom of your feet, use a mirror or ask someone for help.  Do not cut corns or calluses or try to remove them with medicine.  If you  find a minor scrape, cut, or break in the skin on your feet, keep it and the skin around it clean and dry. You may clean these areas with mild soap and water. Do not clean the area with peroxide, alcohol, or iodine.  If you have a wound, scrape, corn, or callus on your foot, look at it several times a day to make sure it is healing and not infected. Check for: ? Redness, swelling, or pain. ? Fluid or blood. ? Warmth. ? Pus or a bad smell. General instructions  Do not cross your legs. This may decrease blood flow to your feet.  Do not use heating pads or hot water bottles on your feet. They may burn your skin. If you have lost feeling in your feet or legs, you may not know this is happening until it is too late.  Protect your feet from hot and cold by wearing shoes, such as at the beach or on hot pavement.  Schedule a complete foot exam at least once a year (annually) or more often if you have foot problems. If you have foot problems, report any cuts, sores, or bruises to your health care provider immediately. Contact a health care provider if:  You have a medical condition that increases your risk of infection and you have any cuts, sores, or bruises on your feet.  You have an injury that is not   healing.  You have redness on your legs or feet.  You feel burning or tingling in your legs or feet.  You have pain or cramps in your legs and feet.  Your legs or feet are numb.  Your feet always feel cold.  You have pain around a toenail. Get help right away if:  You have a wound, scrape, corn, or callus on your foot and: ? You have pain, swelling, or redness that gets worse. ? You have fluid or blood coming from the wound, scrape, corn, or callus. ? Your wound, scrape, corn, or callus feels warm to the touch. ? You have pus or a bad smell coming from the wound, scrape, corn, or callus. ? You have a fever. ? You have a red line going up your leg. Summary  Check your feet every day  for cuts, sores, red spots, swelling, and blisters.  Moisturize feet and legs daily.  Wear shoes that fit properly and have enough cushioning.  If you have foot problems, report any cuts, sores, or bruises to your health care provider immediately.  Schedule a complete foot exam at least once a year (annually) or more often if you have foot problems. This information is not intended to replace advice given to you by your health care provider. Make sure you discuss any questions you have with your health care provider. Document Revised: 12/01/2018 Document Reviewed: 04/11/2016 Elsevier Patient Education  2020 [employer]  Peripheral Neuropathy Peripheral neuropathy is a type of nerve damage. It affects nerves that carry signals between the spinal cord and the arms, legs, and the rest of the body (peripheral nerves). It does not affect nerves in the spinal cord or brain. In peripheral neuropathy, one nerve or a group of nerves may be damaged. Peripheral neuropathy is a broad category that includes many specific nerve disorders, like diabetic neuropathy, hereditary neuropathy, and carpal tunnel syndrome. What are the causes? This condition may be caused by:  Diabetes. This is the most common cause of peripheral neuropathy.  Nerve injury.  Pressure or stress on a nerve that lasts a long time.  Lack (deficiency) of B vitamins. This can result from alcoholism, poor diet, or a restricted diet.  Infections.  Autoimmune diseases, such as rheumatoid arthritis and systemic lupus erythematosus.  Nerve diseases that are passed from parent to child (inherited).  Some medicines, such as cancer medicines (chemotherapy).  Poisonous (toxic) substances, such as lead and mercury.  Too little blood flowing to the legs.  Kidney disease.  Thyroid disease. In some cases, the cause of this condition is not known. What are the signs or symptoms? Symptoms of this condition depend on which of your  nerves is damaged. Common symptoms include:  Loss of feeling (numbness) in the feet, hands, or both.  Tingling in the feet, hands, or both.  Burning pain.  Very sensitive skin.  Weakness.  Not being able to move a part of the body (paralysis).  Muscle twitching.  Clumsiness or poor coordination.  Loss of balance.  Not being able to control your bladder.  Feeling dizzy.  Sexual problems. How is this diagnosed? Diagnosing and finding the cause of peripheral neuropathy can be difficult. Your health care provider will take your medical history and do a physical exam. A neurological exam will also be done. This involves checking things that are affected by your brain, spinal cord, and nerves (nervous system). For example, your health care provider will check your reflexes, how you move, and   what you can feel. You may have other tests, such as:  Blood tests.  Electromyogram (EMG) and nerve conduction tests. These tests check nerve function and how well the nerves are controlling the muscles.  Imaging tests, such as CT scans or MRI to rule out other causes of your symptoms.  Removing a small piece of nerve to be examined in a lab (nerve biopsy). This is rare.  Removing and examining a small amount of the fluid that surrounds the brain and spinal cord (lumbar puncture). This is rare. How is this treated? Treatment for this condition may involve:  Treating the underlying cause of the neuropathy, such as diabetes, kidney disease, or vitamin deficiencies.  Stopping medicines that can cause neuropathy, such as chemotherapy.  Medicine to relieve pain. Medicines may include: ? Prescription or over-the-counter pain medicine. ? Antiseizure medicine. ? Antidepressants. ? Pain-relieving patches that are applied to painful areas of skin.  Surgery to relieve pressure on a nerve or to destroy a nerve that is causing pain.  Physical therapy to help improve movement and  balance.  Devices to help you move around (assistive devices). Follow these instructions at home: Medicines  Take over-the-counter and prescription medicines only as told by your health care provider. Do not take any other medicines without first asking your health care provider.  Do not drive or use heavy machinery while taking prescription pain medicine. Lifestyle   Do not use any products that contain nicotine or tobacco, such as cigarettes and e-cigarettes. Smoking keeps blood from reaching damaged nerves. If you need help quitting, ask your health care provider.  Avoid or limit alcohol. Too much alcohol can cause a vitamin B deficiency, and vitamin B is needed for healthy nerves.  Eat a healthy diet. This includes: ? Eating foods that are high in fiber, such as fresh fruits and vegetables, whole grains, and beans. ? Limiting foods that are high in fat and processed sugars, such as fried or sweet foods. General instructions   If you have diabetes, work closely with your health care provider to keep your blood sugar under control.  If you have numbness in your feet: ? Check every day for signs of injury or infection. Watch for redness, warmth, and swelling. ? Wear padded socks and comfortable shoes. These help protect your feet.  Develop a good support system. Living with peripheral neuropathy can be stressful. Consider talking with a mental health specialist or joining a support group.  Use assistive devices and attend physical therapy as told by your health care provider. This may include using a walker or a cane.  Keep all follow-up visits as told by your health care provider. This is important. Contact a health care provider if:  You have new signs or symptoms of peripheral neuropathy.  You are struggling emotionally from dealing with peripheral neuropathy.  Your pain is not well-controlled. Get help right away if:  You have an injury or infection that is not healing  normally.  You develop new weakness in an arm or leg.  You fall frequently. Summary  Peripheral neuropathy is when the nerves in the arms, or legs are damaged, resulting in numbness, weakness, or pain.  There are many causes of peripheral neuropathy, including diabetes, pinched nerves, vitamin deficiencies, autoimmune disease, and hereditary conditions.  Diagnosing and finding the cause of peripheral neuropathy can be difficult. Your health care provider will take your medical history, do a physical exam, and do tests, including blood tests and nerve function tests.    Treatment involves treating the underlying cause of the neuropathy and taking medicines to help control pain. Physical therapy and assistive devices may also help. This information is not intended to replace advice given to you by your health care provider. Make sure you discuss any questions you have with your health care provider. Document Revised: 02/20/2017 Document Reviewed: 05/19/2016 Elsevier Patient Education  [place] are small areas of thickened skin that occur on the top, sides, or tip of a toe. They contain a cone-shaped core with a point that can press on a nerve below. This causes pain.  Calluses are areas of thickened skin that can occur anywhere on the body, including the hands, fingers, palms, soles of the feet, and heels. Calluses are usually larger than corns. What are the causes? Corns and calluses are caused by rubbing (friction) or pressure, such as from shoes that are too tight or do not fit properly. What increases the risk? Corns are more likely to develop in people who have misshapen toes (toe deformities), such as hammer toes. Calluses can occur with friction to any area of the skin. They are more likely to develop in people who:  Work with their hands.  Wear shoes that fit poorly, are too tight, or are high-heeled.  Have toe deformities. What are the signs  or symptoms? Symptoms of a corn or callus include:  A hard growth on the skin.  Pain or tenderness under the skin.  Redness and swelling.  Increased discomfort while wearing tight-fitting shoes, if your feet are affected. If a corn or callus becomes infected, symptoms may include:  Redness and swelling that gets worse.  Pain.  Fluid, blood, or pus draining from the corn or callus. How is this diagnosed? Corns and calluses may be diagnosed based on your symptoms, your medical history, and a physical exam. How is this treated? Treatment for corns and calluses may include:  Removing the cause of the friction or pressure. This may involve: ? Changing your shoes. ? Wearing shoe inserts (orthotics) or other protective layers in your shoes, such as a corn pad. ? Wearing gloves.  Applying medicine to the skin (topical medicine) to help soften skin in the hardened, thickened areas.  Removing layers of dead skin with a file to reduce the size of the corn or callus.  Removing the corn or callus with a scalpel or laser.  Taking antibiotic medicines, if your corn or callus is infected.  Having surgery, if a toe deformity is the cause. Follow these instructions at home:   Take over-the-counter and prescription medicines only as told by your health care provider.  If you were prescribed an antibiotic, take it as told by your health care provider. Do not stop taking it even if your condition starts to improve.  Wear shoes that fit well. Avoid wearing high-heeled shoes and shoes that are too tight or too loose.  Wear any padding, protective layers, gloves, or orthotics as told by your health care provider.  Soak your hands or feet and then use a file or pumice stone to soften your corn or callus. Do this as told by your health care provider.  Check your corn or callus every day for symptoms of infection. Contact a health care provider if you:  Notice that your symptoms do not  improve with treatment.  Have redness or swelling that gets worse.  Notice that your corn or callus becomes painful.  Have fluid, blood, or  pus coming from your corn or callus.  Have new symptoms. Summary  Corns are small areas of thickened skin that occur on the top, sides, or tip of a toe.  Calluses are areas of thickened skin that can occur anywhere on the body, including the hands, fingers, palms, and soles of the feet. Calluses are usually larger than corns.  Corns and calluses are caused by rubbing (friction) or pressure, such as from shoes that are too tight or do not fit properly.  Treatment may include wearing any padding, protective layers, gloves, or orthotics as told by your health care provider. This information is not intended to replace advice given to you by your health care provider. Make sure you discuss any questions you have with your health care provider. Document Revised: 06/30/2018 Document Reviewed: 01/21/2017 Elsevier Patient Education  [place]  WHAT IS IT? An infection that lies within the keratin of your nail plate that is caused by a fungus.  WHY ME? Fungal infections affect all ages, sexes, races, and creeds.  There may be many factors that predispose you to a fungal infection such as age, coexisting medical conditions such as diabetes, or an autoimmune disease; stress, medications, fatigue, genetics, etc.  Bottom line: fungus thrives in a warm, moist environment and your shoes offer such a location.  IS IT CONTAGIOUS? Theoretically, yes.  You do not want to share shoes, nail clippers or files with someone who has fungal toenails.  Walking around barefoot in the same room or sleeping in the same bed is unlikely to transfer the organism.  It is important to realize, however, that fungus can spread easily from one nail to the next on the same foot.  HOW DO WE TREAT THIS?  There are several ways to treat this  condition.  Treatment may depend on many factors such as age, medications, pregnancy, liver and kidney conditions, etc.  It is best to ask your doctor which options are available to you.  20. No treatment.   Unlike many other medical concerns, you can live with this condition.  However for many people this can be a painful condition and may lead to ingrown toenails or a bacterial infection.  It is recommended that you keep the nails cut short to help reduce the amount of fungal nail. 21. Topical treatment.  These range from herbal remedies to prescription strength nail lacquers.  About 40-50% effective, topicals require twice daily application for approximately 9 to 12 months or until an entirely new nail has grown out.  The most effective topicals are medical grade medications available through physicians offices. 22. Oral antifungal medications.  With an 80-90% cure rate, the most common oral medication requires 3 to 4 months of therapy and stays in your system for a year as the new nail grows out.  Oral antifungal medications do require blood work to make sure it is a safe drug for you.  A liver function panel will be performed prior to starting the medication and after the first month of treatment.  It is important to have the blood work performed to avoid any harmful side effects.  In general, this medication safe but blood work is required. 23. Laser Therapy.  This treatment is performed by applying a specialized laser to the affected nail plate.  This therapy is noninvasive, fast, and non-painful.  It is not covered by insurance and is therefore, out of pocket.  The results have been very good with a  80-95% cure rate.  [facility] is the only practice in the area to offer this therapy. 24. Permanent Nail Avulsion.  Removing the entire nail so that a new nail will not grow back.

## 2019-06-30 ENCOUNTER — Other Ambulatory Visit: Payer: Self-pay | Admitting: Interventional Cardiology

## 2019-07-01 ENCOUNTER — Other Ambulatory Visit: Payer: Medicare Other | Admitting: *Deleted

## 2019-07-01 ENCOUNTER — Other Ambulatory Visit: Payer: Self-pay

## 2019-07-01 DIAGNOSIS — I5032 Chronic diastolic (congestive) heart failure: Secondary | ICD-10-CM

## 2019-07-01 LAB — BASIC METABOLIC PANEL
BUN/Creatinine Ratio: 25 (ref 12–28)
BUN: 57 mg/dL — ABNORMAL HIGH (ref 8–27)
CO2: 21 mmol/L (ref 20–29)
Calcium: 9.3 mg/dL (ref 8.7–10.3)
Chloride: 102 mmol/L (ref 96–106)
Creatinine, Ser: 2.25 mg/dL — ABNORMAL HIGH (ref 0.57–1.00)
GFR calc Af Amer: 23 mL/min/{1.73_m2} — ABNORMAL LOW (ref >59)
GFR calc non Af Amer: 20 mL/min/{1.73_m2} — ABNORMAL LOW (ref >59)
Glucose: 125 mg/dL — ABNORMAL HIGH (ref 65–99)
Potassium: 3.6 mmol/L (ref 3.5–5.2)
Sodium: 140 mmol/L (ref 134–144)

## 2019-07-04 NOTE — Progress Notes (Signed)
Subjective: [name] presents today for follow up of at risk foot care. Pt has h/o NIDDM with PAD and callus(es) right hallux and painful mycotic toenails b/l that are difficult to trim. Pain interferes with ambulation. Aggravating factors include wearing enclosed shoe gear. Pain is relieved with periodic professional debridement.   Her daughter is present during the visit. They voice no new pedal problems on today's visit.   Allergies  Allergen Reactions  . Lotensin [Benazepril Hcl] Anaphylaxis  . Zantac [Ranitidine Hcl] Anaphylaxis  . Tape Itching and Rash     Objective: Vitals:   06/29/19 1052  Temp: (!) 97.1 F (36.2 C)    Pt 86 y.o. year old African American female  in NAD. AAO x 3.   Vascular Examination:  Capillary refill time to digits immediate b/l. Palpable DP pulses b/l. Palpable PT pulses b/l. Pedal hair absent b/l Skin temperature gradient within normal limits b/l.   Dermatological Examination: Pedal skin with normal turgor, texture and tone bilaterally. No open wounds bilaterally. No interdigital macerations bilaterally. Toenails 1-5 b/l elongated, dystrophic, thickened, crumbly with subungual debris and tenderness to dorsal palpation. Hyperkeratotic lesion(s) R hallux.  No erythema, no edema, no drainage, no flocculence.  Musculoskeletal: Normal muscle strength 5/5 to all lower extremity muscle groups bilaterally, no pain crepitus or joint limitation noted with ROM b/l and pes planus deformity noted  Neurological: Protective sensation intact 5/5 intact bilaterally with 10g monofilament b/l Vibratory sensation intact b/l  Assessment: 1. Pain due to onychomycosis of toenails of both feet   2. Callus   3. Type II diabetes mellitus with peripheral circulatory disorder (HCC)    Plan: -Continue diabetic foot care principles. Literature dispensed on today.  -Toenails 1-5 b/l were debrided in length and girth with sterile nail nippers and dremel without iatrogenic  bleeding.  -Callus(es) R hallux were debrided without complication or incident. Total number debrided =1. -Patient to continue soft, supportive shoe gear daily. -Patient to report any pedal injuries to medical professional immediately. -Patient/POA to call should there be question/concern in the interim.  Return in about 3 months (around 09/28/2019) for diabetic nail and callus trim/ Coumadin.

## 2019-07-12 ENCOUNTER — Telehealth: Payer: Self-pay | Admitting: Endocrinology

## 2019-07-12 ENCOUNTER — Other Ambulatory Visit: Payer: Self-pay

## 2019-07-12 MED ORDER — VICTOZA 18 MG/3ML ~~LOC~~ SOPN: PEN_INJECTOR | SUBCUTANEOUS | 1 refills | Status: DC

## 2019-07-12 NOTE — Telephone Encounter (Signed)
Insurance called stating patients dosage changed from taking 1.2mg  to taking half - pharmacy needs a new RX so it does not affect patient's insurance and have a lapse.  Victoza   [id number] [address], [place] Phone:  [phone]  Fax:  [phone]

## 2019-07-12 NOTE — Telephone Encounter (Signed)
Rx for Victoza 0.6mg  SQ QD has been sent.

## 2019-07-22 ENCOUNTER — Other Ambulatory Visit: Payer: Self-pay | Admitting: Interventional Cardiology

## 2019-07-22 DIAGNOSIS — D509 Iron deficiency anemia, unspecified: Secondary | ICD-10-CM | POA: Diagnosis not present

## 2019-07-22 DIAGNOSIS — N184 Chronic kidney disease, stage 4 (severe): Secondary | ICD-10-CM | POA: Diagnosis not present

## 2019-07-22 DIAGNOSIS — I10 Essential (primary) hypertension: Secondary | ICD-10-CM | POA: Diagnosis not present

## 2019-07-22 DIAGNOSIS — I4891 Unspecified atrial fibrillation: Secondary | ICD-10-CM | POA: Diagnosis not present

## 2019-07-23 ENCOUNTER — Ambulatory Visit: Payer: Medicare Other | Attending: Internal Medicine

## 2019-07-23 DIAGNOSIS — Z23 Encounter for immunization: Secondary | ICD-10-CM

## 2019-07-23 NOTE — Progress Notes (Signed)
   Covid-19 Vaccination Clinic  Name:  [name]    MRN: [id number] DOB: Jul 07, 1933  07/23/2019  [name] was observed post Covid-19 immunization for 15 minutes without incident. She was provided with Vaccine Information Sheet and instruction to access the V-Safe system.   [name] was instructed to call 911 with any severe reactions post vaccine: Marland Kitchen Difficulty breathing  . Swelling of face and throat  . A fast heartbeat  . A bad rash all over body  . Dizziness and weakness   Immunizations Administered    Name Date Dose VIS Date Route   Moderna COVID-19 Vaccine 07/23/2019 11:12 AM 0.5 mL 02/2019 Intramuscular   Manufacturer: Moderna   Lot: [id number]   [place]: 62947-654-65

## 2019-08-09 ENCOUNTER — Other Ambulatory Visit: Payer: Self-pay

## 2019-08-09 ENCOUNTER — Ambulatory Visit (INDEPENDENT_AMBULATORY_CARE_PROVIDER_SITE_OTHER): Payer: Medicare Other | Admitting: *Deleted

## 2019-08-09 DIAGNOSIS — I4891 Unspecified atrial fibrillation: Secondary | ICD-10-CM

## 2019-08-09 DIAGNOSIS — I4821 Permanent atrial fibrillation: Secondary | ICD-10-CM

## 2019-08-09 DIAGNOSIS — Z5181 Encounter for therapeutic drug level monitoring: Secondary | ICD-10-CM

## 2019-08-09 LAB — POCT INR: INR: 5 — AB (ref 2.0–3.0)

## 2019-08-09 NOTE — Patient Instructions (Signed)
Description   Hold warfarin today and tomorrow, then start taking 1 tablet daily. Recheck INR in 1 week. [facility] [phone]

## 2019-08-11 DIAGNOSIS — E1122 Type 2 diabetes mellitus with diabetic chronic kidney disease: Secondary | ICD-10-CM | POA: Diagnosis not present

## 2019-08-11 DIAGNOSIS — I10 Essential (primary) hypertension: Secondary | ICD-10-CM | POA: Diagnosis not present

## 2019-08-11 DIAGNOSIS — I4891 Unspecified atrial fibrillation: Secondary | ICD-10-CM | POA: Diagnosis not present

## 2019-08-11 DIAGNOSIS — D509 Iron deficiency anemia, unspecified: Secondary | ICD-10-CM | POA: Diagnosis not present

## 2019-08-19 ENCOUNTER — Ambulatory Visit (INDEPENDENT_AMBULATORY_CARE_PROVIDER_SITE_OTHER): Payer: Medicare Other

## 2019-08-19 ENCOUNTER — Other Ambulatory Visit: Payer: Self-pay

## 2019-08-19 DIAGNOSIS — Z5181 Encounter for therapeutic drug level monitoring: Secondary | ICD-10-CM | POA: Diagnosis not present

## 2019-08-19 DIAGNOSIS — I4891 Unspecified atrial fibrillation: Secondary | ICD-10-CM | POA: Diagnosis not present

## 2019-08-19 DIAGNOSIS — I4821 Permanent atrial fibrillation: Secondary | ICD-10-CM

## 2019-08-19 LAB — POCT INR: INR: 1.5 — AB (ref 2.0–3.0)

## 2019-08-19 NOTE — Patient Instructions (Signed)
Description   Start taking 1 tablet daily except 1.5 tablets on Fridays. Recheck INR in 2 weeks. [facility] Clinic [phone]

## 2019-09-02 ENCOUNTER — Ambulatory Visit (INDEPENDENT_AMBULATORY_CARE_PROVIDER_SITE_OTHER): Payer: Medicare Other | Admitting: *Deleted

## 2019-09-02 ENCOUNTER — Other Ambulatory Visit: Payer: Self-pay

## 2019-09-02 ENCOUNTER — Encounter (INDEPENDENT_AMBULATORY_CARE_PROVIDER_SITE_OTHER): Payer: Self-pay

## 2019-09-02 DIAGNOSIS — I4891 Unspecified atrial fibrillation: Secondary | ICD-10-CM | POA: Diagnosis not present

## 2019-09-02 DIAGNOSIS — I4821 Permanent atrial fibrillation: Secondary | ICD-10-CM

## 2019-09-02 DIAGNOSIS — Z5181 Encounter for therapeutic drug level monitoring: Secondary | ICD-10-CM

## 2019-09-02 LAB — POCT INR: INR: 2 (ref 2.0–3.0)

## 2019-09-02 NOTE — Patient Instructions (Signed)
Description   Continue taking Warfarin 1 tablet daily except 1.5 tablets on Fridays. Recheck INR in 3 weeks. [facility]madin Clinic [phone]

## 2019-09-12 DIAGNOSIS — D631 Anemia in chronic kidney disease: Secondary | ICD-10-CM | POA: Diagnosis not present

## 2019-09-12 DIAGNOSIS — D509 Iron deficiency anemia, unspecified: Secondary | ICD-10-CM | POA: Diagnosis not present

## 2019-09-12 DIAGNOSIS — N189 Chronic kidney disease, unspecified: Secondary | ICD-10-CM | POA: Diagnosis not present

## 2019-09-12 DIAGNOSIS — E1122 Type 2 diabetes mellitus with diabetic chronic kidney disease: Secondary | ICD-10-CM | POA: Diagnosis not present

## 2019-09-12 DIAGNOSIS — N2581 Secondary hyperparathyroidism of renal origin: Secondary | ICD-10-CM | POA: Diagnosis not present

## 2019-09-12 DIAGNOSIS — N184 Chronic kidney disease, stage 4 (severe): Secondary | ICD-10-CM | POA: Diagnosis not present

## 2019-09-12 DIAGNOSIS — I480 Paroxysmal atrial fibrillation: Secondary | ICD-10-CM | POA: Diagnosis not present

## 2019-09-12 DIAGNOSIS — E119 Type 2 diabetes mellitus without complications: Secondary | ICD-10-CM | POA: Diagnosis not present

## 2019-09-13 ENCOUNTER — Encounter: Payer: Self-pay | Admitting: Endocrinology

## 2019-09-13 ENCOUNTER — Ambulatory Visit (INDEPENDENT_AMBULATORY_CARE_PROVIDER_SITE_OTHER): Payer: Medicare Other | Admitting: Endocrinology

## 2019-09-13 ENCOUNTER — Other Ambulatory Visit: Payer: Self-pay

## 2019-09-13 VITALS — BP 118/60 | HR 85 | Ht 66.0 in | Wt 133.0 lb

## 2019-09-13 DIAGNOSIS — E1165 Type 2 diabetes mellitus with hyperglycemia: Secondary | ICD-10-CM

## 2019-09-13 DIAGNOSIS — R634 Abnormal weight loss: Secondary | ICD-10-CM | POA: Diagnosis not present

## 2019-09-13 LAB — POCT GLYCOSYLATED HEMOGLOBIN (HGB A1C): Hemoglobin A1C: 7 % — AB (ref 4.0–5.6)

## 2019-09-13 MED ORDER — REPAGLINIDE 0.5 MG PO TABS: ORAL_TABLET | ORAL | 1 refills | Status: DC

## 2019-09-13 NOTE — Patient Instructions (Signed)
Prandin 1 in am and 2 at dinner  Check blood sugars on waking up 1-2 days a week  Also check blood sugars about 2 hours after meals and do this after different meals by rotation  Recommended blood sugar levels on waking up are 90-130 and about 2 hours after meal is 130-180  Please bring your blood sugar monitor to each visit, thank you

## 2019-09-13 NOTE — Progress Notes (Signed)
Patient ID: [name], female   DOB: September 22, 1933, 86 y.o.   MRN: [id number]    Reason for Appointment: Follow-up for Type 2 Diabetes  Referring physician: Jordan Hawks Rankins  History of Present Illness:          Diagnosis: Type 2 diabetes mellitus, date of diagnosis: ? 55 years ago        Past history: She was initially treated with Metformin but this was done because it caused GI side effects  May also tried Actos at some point, probably started because of the fear of side effects but did not know if it helped Actos stopped ? Reason Over the last few years she has been taking Precose with meals and also Januvia in increasing doses Apparently her blood sugars have been higher since 9/14 when she had abdominal surgery and before that they were as low as 70. On her initial consultation in 6/15 because of her renal dysfunction her medication regimen was changed. Previously was taking Januvia, Precose and glipizide without adequate control; her blood sugars were occasionally over 200 fasting and A1c was 9.4 She was started on Victoza 0.6 mg and Lantus insulin 8 units a day but the Lantus had to be stopped because of relatively low fasting glucose.  Since she had tried Prandin and glipizide and her postprandial readings were as high as 330 she was started on Humalog at mealtimes in 8/15  Recent history:    Current diabetes treatment regimen: Victoza 0.6 mg daily  Her A1c is the relatively high at 7%, previously stable at 6.4 Has been low as 5.8 previously   Current blood sugar patterns, current management  and problems identified:  She has reduced her Victoza to 0.6 mg because of her previous tendency to losing weight  However she said that she has some difficulty doing the injection because of the arthritis in her hands  Also if her blood sugar is near normal she will skip her dose here and there completely  She has some fluctuation in her blood sugars with occasional  readings over 200  She has not previously tried a weekly GLP-1  she gets Meals on Wheels in the evenings and this is her main meal  Otherwise usually may eat small meals such as cheese toast in the morning or cereal and mostly snacks midday  Her appetite is fair with overall decreased intake compared to last year especially at breakfast  She appears to have lost about 20 pounds this year and has not discussed with PCP  Again blood sugar monitoring is somewhat inconsistent       Oral hypoglycemic drugs the patient is taking are: none  Side effects from medications have been: None   Glucose monitoring:  done once  a day        Glucometer:  One Touch Verio Blood sugars from download:   PRE-MEAL Fasting Lunch Dinner Bedtime Overall  Glucose range:       Mean/median:        POST-MEAL PC Breakfast PC Lunch PC Dinner  Glucose range:     Mean/median:        Previous readings: AM: 105, 108; after 12 noon: 103-148 Bedtime 174 with overall AVERAGE 127    Self-care:   Meals: 3 meals per day.   Breakfast: She is toast or cereal sometimes 1/2 Boost; will have spaghetti or rice at supper. Dinner 6 pm           Exercise:  none,  unable to          Dietician visit: Most recent: Unknown.               Weight history:  Wt Readings from Last 3 Encounters:  09/13/19 133 lb (60.3 kg)  06/13/19 146 lb 9.6 oz (66.5 kg)  03/02/19 154 lb 12.8 oz (70.2 kg)   Glycemic control:  Lab Results  Component Value Date   HGBA1C 7.0 (A) 09/13/2019   HGBA1C 6.4 (A) 06/13/2019   HGBA1C 6.4 (A) 02/24/2019   Lab Results  Component Value Date   MICROALBUR 132.3 (H) 06/18/2017   LDLCALC 32 06/13/2019   CREATININE 2.25 (H) 07/01/2019     Allergies as of 09/13/2019      Reactions   Lotensin [benazepril Hcl] Anaphylaxis   Zantac [ranitidine Hcl] Anaphylaxis   Tape Itching, Rash      Medication List       Accurate as of September 13, 2019 12:52 PM. If you have any questions, ask your nurse or  doctor.        acidophilus Caps capsule Take 1 capsule by mouth daily as needed (GI issues).   atorvastatin 40 MG tablet Commonly known as: LIPITOR Take 40 mg by mouth every evening.   B-D UF III MINI PEN NEEDLES 31G X 5 MM Misc Generic drug: Insulin Pen Needle USE 3 PEN NEEDLES PER DAY   colchicine 0.6 MG tablet Take 0.6 mg by mouth daily.   Cranberry 250 MG Caps Take 750 mg by mouth daily.   diltiazem 180 MG 24 hr capsule Commonly known as: CARDIZEM CD TAKE 1 CAPSULE BY MOUTH EVERY DAY   econazole nitrate 1 % cream Apply 1 application topically daily as needed (to groin rash).   esomeprazole 20 MG capsule Commonly known as: NEXIUM Take 20 mg by mouth daily before breakfast.   ferrous sulfate 325 (65 FE) MG tablet Take 325 mg by mouth daily with breakfast.   fluticasone 50 MCG/ACT nasal spray Commonly known as: FLONASE Place 2 sprays into both nostrils daily as needed for rhinitis.   furosemide 80 MG tablet Commonly known as: LASIX Take 1.5 tablets (120mg  total) by mouth every morning.  Take one tablet by mouth every evening.   hydrALAZINE 25 MG tablet Commonly known as: APRESOLINE TAKE 3 TABLETS (75 MG TOTAL) BY MOUTH 3 (THREE) TIMES DAILY.   metolazone 2.5 MG tablet Commonly known as: ZAROXOLYN TAKE 1 TABLET (2.5 MG TOTAL) BY MOUTH ONCE A WEEK.   metoprolol tartrate 100 MG tablet Commonly known as: LOPRESSOR Take 1 tablet (100 mg total) by mouth 2 (two) times daily.   OneTouch Verio test strip Generic drug: glucose blood USE 1 STRIP TO CHECK GLUCOSE TWICE DAILY AS DIRECTED   polyethylene glycol 17 g packet Commonly known as: MIRALAX / GLYCOLAX Take 17 g by mouth daily as needed for mild constipation.   potassium chloride 10 MEQ tablet Commonly known as: KLOR-CON TAKE 3 TABLETS BY MOUTH EVERY MORNING AND TAKE 3 TABLETS BY MOUTH EVERY EVENING   repaglinide 0.5 MG tablet Commonly known as: PRANDIN 1 tab before breakfast and 2 before supper Started  by: Elayne Snare, MD   spironolactone 25 MG tablet Commonly known as: ALDACTONE Take 0.5 tablets (12.5 mg total) by mouth every Monday, Wednesday, and Friday.   Victoza 18 MG/3ML Sopn Generic drug: liraglutide INJECT 0.6MG  SUBCUTANEOUSLY DAILY.   warfarin 5 MG tablet Commonly known as: COUMADIN Take as directed by the anticoagulation clinic. If you are  unsure how to take this medication, talk to your nurse or doctor. Original instructions: TAKE AS DIRECTED BY COUMADIN CLINIC       Allergies:  Allergies  Allergen Reactions  . Lotensin [Benazepril Hcl] Anaphylaxis  . Zantac [Ranitidine Hcl] Anaphylaxis  . Tape Itching and Rash    Past Medical History:  Diagnosis Date  . Anemia due to blood loss, chronic 08/23/2013  . Atrial fibrillation ([place]) 12/09/2012  . Candidal skin infection 12/23/2012  . Chronic diastolic heart failure ([place]) 08/23/2013  . Chronic kidney disease, stage IV (severe) ([place]) 12/09/2012  . Diabetes mellitus with renal complications ([place]) 58/0/9983  . Diabetes mellitus without complication ([place])   . Dyslipidemia 12/09/2012  . Fall at home Sept. 3, 2016   Fx  3 ribs  . GERD (gastroesophageal reflux disease) 12/23/2012  . HTN (hypertension) 12/09/2012  . Hyperlipidemia 12/23/2012  . Hypertension   . Ileus, postoperative ([place]) 12/17/2012  . Intractable pain 12/03/2014  . Irregular heart beat   . Long term current use of anticoagulant therapy 02/22/2013  . On amiodarone therapy 02/22/2013  . Peptic esophagitis 08/23/2013  . Pressure injury of skin 05/21/2016  . Renal disorder   . Type II diabetes mellitus, uncontrolled ([place]) 09/06/2013  . Umbilical hernia, incarcerated - reducible 12/10/2012    Past Surgical History:  Procedure Laterality Date  . BREAST LUMPECTOMY Right 2001   2 lumpectomies with radiation both 2001  . CATARACT EXTRACTION, BILATERAL    . HERNIA REPAIR    . I & D EXTREMITY Right 05/19/2016   Procedure: MINOR IRRIGATION AND DEBRIDEMENT Right great toe;   Surgeon: Gaynelle Arabian, MD;  Location: [facility];  Service: Orthopedics;  Laterality: Right;  . INSERTION OF MESH N/A 12/12/2012   Procedure: INSERTION OF MESH;  Surgeon: Madilyn Hook, DO;  Location: [facility];  Service: General;  Laterality: N/A;  . LAPAROSCOPIC LYSIS OF ADHESIONS N/A 12/12/2012   Procedure: LAPAROSCOPIC LYSIS OF ADHESIONS;  Surgeon: Madilyn Hook, DO;  Location: [facility];  Service: General;  Laterality: N/A;  . Right Lumpectomy    . VENTRAL HERNIA REPAIR N/A 12/12/2012   Procedure: LAPAROSCOPIC VENTRAL HERNIA;  Surgeon: Madilyn Hook, DO;  Location: [facility];  Service: General;  Laterality: N/A;    Family History  Problem Relation Age of Onset  . Diabetes Mother   . Diabetes Sister     Social History:  reports that she has quit smoking. Her smoking use included cigarettes. She has never used smokeless tobacco. She reports that she does not drink alcohol and does not use drugs.    Review of Systems        Lipids: She has been on 40 mg dose Lipitor for hyperlipidemia       Lab Results  Component Value Date   CHOL 92 06/13/2019   HDL 46.80 06/13/2019   LDLCALC 32 06/13/2019   LDLDIRECT 37.8 10/27/2013   TRIG 66.0 06/13/2019   CHOLHDL 2 06/13/2019       The blood pressure has been lower recently  Followed by other physicians Including cardiologist and nephrologist Hydralazine has been stopped   BP Readings from Last 3 Encounters:  09/13/19 118/60  06/13/19 120/70  03/02/19 (!) 146/72       She has had history of swelling of legs and is on 80 mg Lasix      Chronic kidney disease followed by nephrologist, recent labs not available   Lab Results  Component Value Date   CREATININE 2.25 (H) 07/01/2019  Last diabetic foot exam in 9/20 with podiatrist  She has weakness in her legs since 2014; needs a walker and cannot walk  Physical Examination:  BP 118/60   Pulse 85   Ht 5\' 6"  (1.676 m)   Wt 133 lb (60.3 kg)   SpO2 99%   BMI 21.47 kg/m      ASSESSMENT:  Diabetes type 2, long-standing  See history of present illness for detailed discussion of his current management, blood sugar patterns and problems identified  She has a fairly good A1c of 7%  This may be somewhat affected by her renal failure and anemia  Her main difficulty appears to be progressive weight loss Even with 0.6 mg of Victoza she continues to lose weight and has variable appetite Unlikely that Victoza itself is causing weight loss  She is also having difficulty injecting the Victoza because of her arthritis and has not taken it every day   RENAL dysfunction: Followed by nephrologist, no recent records available   HYPERTENSION: Her blood pressure is normal today  Unexplained weight loss: This is progressive and needs to be evaluated by PCP, last TSH was done in 11/20   PLAN:   She was given options of using either Trulicity low-dose for ease of use or different medication Showed her how the Trulicity pen works and she was able to manipulate this without difficulty Considering that she is still not having a good appetite will temporarily stop her GLP-1 drugs She will be given a trial of Prandin 0.5 mg in the morning and 1 mg before dinner Discussed timing of taking the medication right before eating and to let us know if she has any tendency to low sugars  She usually has some carbohydrate at breakfast and can take Prandin in the morning to  If her blood sugars are consistently high in the evening she will let us know We will review her blood sugar again on the phone visit in 1 month Reminded her to have some protein at breakfast daily   She will schedule an appointment with her PCP to evaluate weight loss  Patient Instructions  Prandin 1 in am and 2 at dinner  Check blood sugars on waking up 1-2 days a week  Also check blood sugars about 2 hours after meals and do this after different meals by rotation  Recommended blood sugar levels on  waking up are 90-130 and about 2 hours after meal is 130-180  Please bring your blood sugar monitor to each visit, thank you           Elayne Snare 09/13/2019, 12:52 PM   Note: This office note was prepared with Dragon voice recognition system technology. Any transcriptional errors that result from this process are unintentional.

## 2019-09-18 ENCOUNTER — Other Ambulatory Visit: Payer: Self-pay | Admitting: Interventional Cardiology

## 2019-09-20 ENCOUNTER — Telehealth: Payer: Self-pay | Admitting: Interventional Cardiology

## 2019-09-20 MED ORDER — FUROSEMIDE 80 MG PO TABS: ORAL_TABLET | ORAL | 1 refills | Status: DC

## 2019-09-20 NOTE — Telephone Encounter (Signed)
   *  STAT* If patient is at the pharmacy, call can be transferred to refill team.   1. Which medications need to be refilled? (please list name of each medication and dose if known)   furosemide (LASIX) 80 MG tablet    2. Which pharmacy/location (including street and city if local pharmacy) is medication to be sent to? [id number] - [place], [place] - [address]  3. Do they need a 30 day or 90 day supply? 90 days   Pt is completely out of medication

## 2019-09-20 NOTE — Telephone Encounter (Signed)
Pt's medication was sent to pt's pharmacy as requested. Confirmation received.  °

## 2019-09-21 DIAGNOSIS — R634 Abnormal weight loss: Secondary | ICD-10-CM | POA: Diagnosis not present

## 2019-09-21 DIAGNOSIS — I4821 Permanent atrial fibrillation: Secondary | ICD-10-CM | POA: Diagnosis not present

## 2019-09-21 DIAGNOSIS — I10 Essential (primary) hypertension: Secondary | ICD-10-CM | POA: Diagnosis not present

## 2019-09-21 DIAGNOSIS — I5030 Unspecified diastolic (congestive) heart failure: Secondary | ICD-10-CM | POA: Diagnosis not present

## 2019-09-27 DIAGNOSIS — D631 Anemia in chronic kidney disease: Secondary | ICD-10-CM | POA: Diagnosis not present

## 2019-09-27 DIAGNOSIS — N184 Chronic kidney disease, stage 4 (severe): Secondary | ICD-10-CM | POA: Diagnosis not present

## 2019-09-27 DIAGNOSIS — N2581 Secondary hyperparathyroidism of renal origin: Secondary | ICD-10-CM | POA: Diagnosis not present

## 2019-09-27 DIAGNOSIS — E1122 Type 2 diabetes mellitus with diabetic chronic kidney disease: Secondary | ICD-10-CM | POA: Diagnosis not present

## 2019-09-28 ENCOUNTER — Ambulatory Visit: Payer: Medicare Other | Admitting: Podiatry

## 2019-09-30 ENCOUNTER — Ambulatory Visit (INDEPENDENT_AMBULATORY_CARE_PROVIDER_SITE_OTHER): Payer: Medicare Other | Admitting: *Deleted

## 2019-09-30 ENCOUNTER — Other Ambulatory Visit: Payer: Self-pay

## 2019-09-30 DIAGNOSIS — Z5181 Encounter for therapeutic drug level monitoring: Secondary | ICD-10-CM | POA: Diagnosis not present

## 2019-09-30 DIAGNOSIS — I4821 Permanent atrial fibrillation: Secondary | ICD-10-CM

## 2019-09-30 DIAGNOSIS — I4891 Unspecified atrial fibrillation: Secondary | ICD-10-CM

## 2019-09-30 LAB — POCT INR: INR: 3.7 — AB (ref 2.0–3.0)

## 2019-09-30 NOTE — Patient Instructions (Signed)
Description   Hold today's dose of Warfarin then continue taking Warfarin 1 tablet daily except 1.5 tablets on Fridays. Recheck INR in 3 weeks. [facility] [phone]

## 2019-10-03 ENCOUNTER — Other Ambulatory Visit: Payer: Self-pay | Admitting: Interventional Cardiology

## 2019-10-06 DIAGNOSIS — E7800 Pure hypercholesterolemia, unspecified: Secondary | ICD-10-CM | POA: Diagnosis not present

## 2019-10-06 DIAGNOSIS — E1122 Type 2 diabetes mellitus with diabetic chronic kidney disease: Secondary | ICD-10-CM | POA: Diagnosis not present

## 2019-10-06 LAB — HEMOGLOBIN A1C: Hemoglobin A1C: 7.4

## 2019-10-06 LAB — LIPID PANEL
Cholesterol: 68 (ref 0–200)
HDL: 33 — AB (ref 35–70)
LDL Cholesterol: 20
Triglycerides: 68 (ref 40–160)

## 2019-10-06 LAB — BASIC METABOLIC PANEL
BUN: 83 — AB (ref 4–21)
Creatinine: 2.5 — AB (ref <=1.1)

## 2019-10-07 ENCOUNTER — Other Ambulatory Visit: Payer: Self-pay | Admitting: *Deleted

## 2019-10-07 DIAGNOSIS — I5032 Chronic diastolic (congestive) heart failure: Secondary | ICD-10-CM

## 2019-10-11 ENCOUNTER — Other Ambulatory Visit: Payer: Self-pay

## 2019-10-11 ENCOUNTER — Telehealth: Payer: Medicare Other | Admitting: Endocrinology

## 2019-10-11 DIAGNOSIS — N184 Chronic kidney disease, stage 4 (severe): Secondary | ICD-10-CM | POA: Diagnosis not present

## 2019-10-11 DIAGNOSIS — D509 Iron deficiency anemia, unspecified: Secondary | ICD-10-CM | POA: Diagnosis not present

## 2019-10-11 DIAGNOSIS — E1122 Type 2 diabetes mellitus with diabetic chronic kidney disease: Secondary | ICD-10-CM | POA: Diagnosis not present

## 2019-10-11 DIAGNOSIS — I480 Paroxysmal atrial fibrillation: Secondary | ICD-10-CM | POA: Diagnosis not present

## 2019-10-12 ENCOUNTER — Telehealth (INDEPENDENT_AMBULATORY_CARE_PROVIDER_SITE_OTHER): Payer: Medicare Other | Admitting: Endocrinology

## 2019-10-12 ENCOUNTER — Other Ambulatory Visit: Payer: Self-pay

## 2019-10-12 DIAGNOSIS — E1165 Type 2 diabetes mellitus with hyperglycemia: Secondary | ICD-10-CM | POA: Diagnosis not present

## 2019-10-12 NOTE — Progress Notes (Signed)
Patient ID: [name], female   DOB: 24-Apr-1933, 86 y.o.   MRN: [id number]  Today's office visit was provided via telemedicine using a telephone call to the patient Patient has been explained the limitations of evaluation and management by telemedicine and the availability of in person appointments.  The patient understood the limitations and agreed to proceed. Patient also understood that the telehealth visit is billable. . Location of the patient: Home . Location of the provider: Office  the patient's daughter [name], they patient and myself were participating in the encounter   Reason for Appointment: Follow-up for Type 2 Diabetes  Referring physician: [place] Rankins  History of Present Illness:          Diagnosis: Type 2 diabetes mellitus, date of diagnosis: ? 67 years ago        Past history: She was initially treated with Metformin but this was done because it caused GI side effects  May also tried Actos at some point, probably started because of the fear of side effects but did not know if it helped Actos stopped ? Reason Over the last few years she has been taking Precose with meals and also Januvia in increasing doses Apparently her blood sugars have been higher since 9/14 when she had abdominal surgery and before that they were as low as 70. On her initial consultation in 6/15 because of her renal dysfunction her medication regimen was changed. Previously was taking Januvia, Precose and glipizide without adequate control; her blood sugars were occasionally over 200 fasting and A1c was 9.4 She was started on Victoza 0.6 mg and Lantus insulin 8 units a day but the Lantus had to be stopped because of relatively low fasting glucose.  Since she had tried Prandin and glipizide and her postprandial readings were as high as 330 she was started on Humalog at mealtimes in 8/15  Recent history:    Current diabetes treatment regimen: Prandin 0.5 mg before breakfast and 1  mg before dinner  Her A1c last was 7%, previously stable at 6.4 Has been low as 5.8 previously   Current blood sugar patterns, current management  and problems identified:  She has been taken off Victoza because of having difficulty doing the injection as well as tendency to weight loss  She is now only on Prandin as above since about a month ago  She has had only a couple of readings over 200 and once had a reading of 184 after dinner but usually blood sugars appear to be within her 180 target at night  Also fasting readings are not much higher than 100 usually with stopping her Victoza  Her daughter is helping her check her blood sugars more often now  She appears to have leveled off her weight  She thinks that her appetite is reasonably okay and she is trying to eat more at breakfast now  she gets Meals on Wheels in the evenings and this is her main meal  Lowest blood sugar is 97 this morning       Oral hypoglycemic drugs the patient is taking are: none  Side effects from medications have been: None   Glucose monitoring:  done once  a day        Glucometer:  One Touch Verio Blood sugars from recall:   PRE-MEAL Fasting Lunch Dinner Bedtime Overall  Glucose range:  97-145      Mean/median:     ?   POST-MEAL PC Breakfast PC Lunch PC  Dinner  Glucose range:    126-246  Mean/median:        Previous readings: AM: 105, 108; after 12 noon: 103-148 Bedtime 174 with overall AVERAGE 127    Self-care:   Meals: 3 meals per day.   Breakfast: She is toast or cereal sometimes 1/2 Boost; will have spaghetti or rice at supper. Dinner 6 pm           Exercise:  none, unable to          Dietician visit: Most recent: Unknown.               Weight history:  Wt Readings from Last 3 Encounters:  09/13/19 133 lb (60.3 kg)  06/13/19 146 lb 9.6 oz (66.5 kg)  03/02/19 154 lb 12.8 oz (70.2 kg)   Glycemic control:  Lab Results  Component Value Date   HGBA1C 7.0 (A) 09/13/2019    HGBA1C 6.4 (A) 06/13/2019   HGBA1C 6.4 (A) 02/24/2019   Lab Results  Component Value Date   MICROALBUR 132.3 (H) 06/18/2017   LDLCALC 32 06/13/2019   CREATININE 2.25 (H) 07/01/2019     Allergies as of 10/12/2019      Reactions   Lotensin [benazepril Hcl] Anaphylaxis   Zantac [ranitidine Hcl] Anaphylaxis   Tape Itching, Rash      Medication List       Accurate as of October 12, 2019  9:49 AM. If you have any questions, ask your nurse or doctor.        acidophilus Caps capsule Take 1 capsule by mouth daily as needed (GI issues).   atorvastatin 40 MG tablet Commonly known as: LIPITOR Take 40 mg by mouth every evening.   B-D UF III MINI PEN NEEDLES 31G X 5 MM Misc Generic drug: Insulin Pen Needle USE 3 PEN NEEDLES PER DAY   colchicine 0.6 MG tablet Take 0.6 mg by mouth daily.   Cranberry 250 MG Caps Take 750 mg by mouth daily.   diltiazem 180 MG 24 hr capsule Commonly known as: CARDIZEM CD TAKE 1 CAPSULE BY MOUTH EVERY DAY   econazole nitrate 1 % cream Apply 1 application topically daily as needed (to groin rash).   esomeprazole 20 MG capsule Commonly known as: NEXIUM Take 20 mg by mouth daily before breakfast.   ferrous sulfate 325 (65 FE) MG tablet Take 325 mg by mouth daily with breakfast.   fluticasone 50 MCG/ACT nasal spray Commonly known as: FLONASE Place 2 sprays into both nostrils daily as needed for rhinitis.   furosemide 80 MG tablet Commonly known as: LASIX Take 1.5 tablets (120mg  total) by mouth every morning.  Take one tablet by mouth every evening.   hydrALAZINE 25 MG tablet Commonly known as: APRESOLINE TAKE 3 TABLETS (75 MG TOTAL) BY MOUTH 3 (THREE) TIMES DAILY.   metolazone 2.5 MG tablet Commonly known as: ZAROXOLYN TAKE 1 TABLET (2.5 MG TOTAL) BY MOUTH ONCE A WEEK.   metoprolol tartrate 100 MG tablet Commonly known as: LOPRESSOR TAKE 1 TABLET BY MOUTH TWICE A DAY   OneTouch Verio test strip Generic drug: glucose blood USE 1 STRIP  TO CHECK GLUCOSE TWICE DAILY AS DIRECTED   polyethylene glycol 17 g packet Commonly known as: MIRALAX / GLYCOLAX Take 17 g by mouth daily as needed for mild constipation.   potassium chloride 10 MEQ tablet Commonly known as: KLOR-CON TAKE 3 TABLETS BY MOUTH EVERY MORNING AND TAKE 3 TABLETS BY MOUTH EVERY EVENING   repaglinide 0.5 MG  tablet Commonly known as: PRANDIN 1 tab before breakfast and 2 before supper   spironolactone 25 MG tablet Commonly known as: ALDACTONE Take 0.5 tablets (12.5 mg total) by mouth every Monday, Wednesday, and Friday.   Victoza 18 MG/3ML Sopn Generic drug: liraglutide INJECT 0.6MG  SUBCUTANEOUSLY DAILY.   warfarin 5 MG tablet Commonly known as: COUMADIN Take as directed by the anticoagulation clinic. If you are unsure how to take this medication, talk to your nurse or doctor. Original instructions: TAKE AS DIRECTED BY COUMADIN CLINIC       Allergies:  Allergies  Allergen Reactions  . Lotensin [Benazepril Hcl] Anaphylaxis  . Zantac [Ranitidine Hcl] Anaphylaxis  . Tape Itching and Rash    Past Medical History:  Diagnosis Date  . Anemia due to blood loss, chronic 08/23/2013  . Atrial fibrillation ([place]) 12/09/2012  . Candidal skin infection 12/23/2012  . Chronic diastolic heart failure ([place]) 08/23/2013  . Chronic kidney disease, stage IV (severe) ([place]) 12/09/2012  . Diabetes mellitus with renal complications ([place]) 56/04/5636  . Diabetes mellitus without complication ([place])   . Dyslipidemia 12/09/2012  . Fall at home Sept. 3, 2016   Fx  3 ribs  . GERD (gastroesophageal reflux disease) 12/23/2012  . HTN (hypertension) 12/09/2012  . Hyperlipidemia 12/23/2012  . Hypertension   . Ileus, postoperative ([place]) 12/17/2012  . Intractable pain 12/03/2014  . Irregular heart beat   . Long term current use of anticoagulant therapy 02/22/2013  . On amiodarone therapy 02/22/2013  . Peptic esophagitis 08/23/2013  . Pressure injury of skin 05/21/2016  . Renal disorder   .  Type II diabetes mellitus, uncontrolled ([place]) 09/06/2013  . Umbilical hernia, incarcerated - reducible 12/10/2012    Past Surgical History:  Procedure Laterality Date  . BREAST LUMPECTOMY Right 2001   2 lumpectomies with radiation both 2001  . CATARACT EXTRACTION, BILATERAL    . HERNIA REPAIR    . I & D EXTREMITY Right 05/19/2016   Procedure: MINOR IRRIGATION AND DEBRIDEMENT Right great toe;  Surgeon: Gaynelle Arabian, MD;  Location: [facility];  Service: Orthopedics;  Laterality: Right;  . INSERTION OF MESH N/A 12/12/2012   Procedure: INSERTION OF MESH;  Surgeon: Madilyn Hook, DO;  Location: [facility];  Service: General;  Laterality: N/A;  . LAPAROSCOPIC LYSIS OF ADHESIONS N/A 12/12/2012   Procedure: LAPAROSCOPIC LYSIS OF ADHESIONS;  Surgeon: Madilyn Hook, DO;  Location: [facility];  Service: General;  Laterality: N/A;  . Right Lumpectomy    . VENTRAL HERNIA REPAIR N/A 12/12/2012   Procedure: LAPAROSCOPIC VENTRAL HERNIA;  Surgeon: Madilyn Hook, DO;  Location: [facility];  Service: General;  Laterality: N/A;    Family History  Problem Relation Age of Onset  . Diabetes Mother   . Diabetes Sister     Social History:  reports that she has quit smoking. Her smoking use included cigarettes. She has never used smokeless tobacco. She reports that she does not drink alcohol and does not use drugs.    Review of Systems       Lipids: She has been on 40 mg dose Lipitor for hyperlipidemia       Lab Results  Component Value Date   CHOL 92 06/13/2019   HDL 46.80 06/13/2019   LDLCALC 32 06/13/2019   LDLDIRECT 37.8 10/27/2013   TRIG 66.0 06/13/2019   CHOLHDL 2 06/13/2019       The blood pressure has been as below  Followed by other physicians Including cardiologist and nephrologist   BP Readings  from Last 3 Encounters:  09/13/19 118/60  06/13/19 120/70  03/02/19 (!) 146/72       She has had history of swelling of legs and is on 80 mg Lasix      Chronic kidney disease followed by nephrologist, recent  labs not available   Lab Results  Component Value Date   CREATININE 2.25 (H) 07/01/2019   Last diabetic foot exam in 9/20 with podiatrist  She has weakness in her legs since 2014; needs a walker and cannot walk  Weight loss: She has been evaluated by PCP, details not available However she is having significant diarrhea with occasional incontinence and her daughter thinks it is from colchicine   Physical Examination:  There were no vitals taken for this visit.    ASSESSMENT:  Diabetes type 2, long-standing  See history of present illness for detailed discussion of his current management, blood sugar patterns and problems identified  Last A1c 7%  Not clear accurate this is because of her renal failure and anemia  With taking Prandin her blood sugars appear to be mostly well controlled and only sporadically high after dinner This is not related to her forgetting her medication but likely from variable carbohydrate intake Her evening meal is mostly from Meals on Wheels She is apparently not having any further weight loss Appears to be doing well without Victoza which she was having difficulty injecting anyway  Given her age and comorbid conditions her level of control is quite adequate   Diarrhea and weight loss: She will continue to follow-up with her PCP and other physicians involved   PLAN:   No change in medication, she will continue on Prandin alone at the same dose of 0.5 mg in the morning and 1 mg before dinner Reminded her to take this right before eating May check less frequently in the mornings and do once a day readings after various meals  Follow-up in 2 months  There are no Patient Instructions on file for this visit.  Duration of telephone encounter 8 minutes  Elayne Snare 10/12/2019, 9:49 AM   Note: This office note was prepared with Dragon voice recognition system technology. Any transcriptional errors that result from this process are  unintentional.

## 2019-10-21 ENCOUNTER — Ambulatory Visit (INDEPENDENT_AMBULATORY_CARE_PROVIDER_SITE_OTHER): Payer: Medicare Other

## 2019-10-21 ENCOUNTER — Other Ambulatory Visit: Payer: Self-pay

## 2019-10-21 ENCOUNTER — Telehealth: Payer: Self-pay | Admitting: *Deleted

## 2019-10-21 ENCOUNTER — Other Ambulatory Visit: Payer: Medicare Other | Admitting: *Deleted

## 2019-10-21 DIAGNOSIS — I5032 Chronic diastolic (congestive) heart failure: Secondary | ICD-10-CM

## 2019-10-21 DIAGNOSIS — I4821 Permanent atrial fibrillation: Secondary | ICD-10-CM | POA: Diagnosis not present

## 2019-10-21 DIAGNOSIS — Z5181 Encounter for therapeutic drug level monitoring: Secondary | ICD-10-CM

## 2019-10-21 DIAGNOSIS — I4891 Unspecified atrial fibrillation: Secondary | ICD-10-CM | POA: Diagnosis not present

## 2019-10-21 LAB — BASIC METABOLIC PANEL
BUN/Creatinine Ratio: 39 — ABNORMAL HIGH (ref 12–28)
BUN: 94 mg/dL (ref 8–27)
CO2: 21 mmol/L (ref 20–29)
Calcium: 9.5 mg/dL (ref 8.7–10.3)
Chloride: 97 mmol/L (ref 96–106)
Creatinine, Ser: 2.38 mg/dL — ABNORMAL HIGH (ref 0.57–1.00)
GFR calc Af Amer: 21 mL/min/{1.73_m2} — ABNORMAL LOW (ref >59)
GFR calc non Af Amer: 18 mL/min/{1.73_m2} — ABNORMAL LOW (ref >59)
Glucose: 178 mg/dL — ABNORMAL HIGH (ref 65–99)
Potassium: 5.1 mmol/L (ref 3.5–5.2)
Sodium: 133 mmol/L — ABNORMAL LOW (ref 134–144)

## 2019-10-21 LAB — PROTIME-INR
INR: 8.7 (ref 0.9–1.2)
Prothrombin Time: 86 s — ABNORMAL HIGH (ref 9.1–12.0)

## 2019-10-21 LAB — POCT INR: INR: 8 — AB (ref 2.0–3.0)

## 2019-10-21 MED ORDER — FUROSEMIDE 80 MG PO TABS: 120.0000 mg | ORAL_TABLET | Freq: Every day | ORAL | 1 refills | Status: DC

## 2019-10-21 NOTE — Telephone Encounter (Signed)
-----  Message from Belva Crome, MD sent at 10/21/2019  5:10 PM EDT ----- Let the patient know the BUN and creatinine are worse than 3 months ago suggesting that we are using too much diuretic.  Potassium is also upper normal at 5.1.  Discontinue spironolactone.  Decrease furosemide to 80 mg/day.  Be met in 2 weeks. A copy will be sent to Rankins, Bill Salinas, MD

## 2019-10-21 NOTE — Telephone Encounter (Signed)
Spoke with pt and reviewed results and recommendations. Pt states that she has been taking Furosemide 120mg  every AM and 80mg  every PM.  Reviewed with Dr. Tamala Julian and he said to eliminate the evening dose.  Scheduled labs for 8/13.  Pt verbalized understanding and was in agreement with this plan.

## 2019-10-25 ENCOUNTER — Other Ambulatory Visit: Payer: Self-pay

## 2019-10-25 ENCOUNTER — Ambulatory Visit (INDEPENDENT_AMBULATORY_CARE_PROVIDER_SITE_OTHER): Payer: Medicare Other | Admitting: *Deleted

## 2019-10-25 DIAGNOSIS — I4891 Unspecified atrial fibrillation: Secondary | ICD-10-CM

## 2019-10-25 DIAGNOSIS — Z5181 Encounter for therapeutic drug level monitoring: Secondary | ICD-10-CM | POA: Diagnosis not present

## 2019-10-25 LAB — POCT INR: INR: 1.7 — AB (ref 2.0–3.0)

## 2019-10-25 NOTE — Patient Instructions (Signed)
Description   Take 1.5 tablets today and then start taking 1 tablet daily.  Recheck INR in 10 days.  [facility] [phone]

## 2019-10-27 ENCOUNTER — Telehealth: Payer: Self-pay | Admitting: Interventional Cardiology

## 2019-10-27 NOTE — Telephone Encounter (Signed)
° ° °  Pt c/o swelling: STAT is pt has developed SOB within 24 hours  1) How much weight have you gained and in what time span? Not sure  2) If swelling, where is the swelling located? Left leg, from the toe up to the thigh  3) Are you currently taking a fluid pill? Yes  4) Are you currently SOB? No  5) Do you have a log of your daily weights (if so, list)?   6) Have you gained 3 pounds in a day or 5 pounds in a week?   7) Have you traveled recently? No  NP Betty from [facility] called, she said she called pt and concern about her left leg. It started swelling up since Dr. Tamala Julian decrease her fluid pill. Pt is immobile due to leg swollen. Inez Catalina would like to speak with a nurse to discuss

## 2019-10-27 NOTE — Telephone Encounter (Signed)
Talked to [facility] NP with [place]. She is concerned about patient's left leg swelling that has gotten worse since her lasix was decreased to once a day and spirolactone was d/c. Informed her of the lab results and reason behind the change. Informed her that we will consult DOD for advisement.   DOD, Dr. Burt Knack recommend patient to take metolazone tomorrow before morning dose of lasix, and she needs to be seen next week at the latest in the office by a provider.   Called and left message for [facility] NP to call back.

## 2019-10-27 NOTE — Telephone Encounter (Signed)
Spoke with the pts daughter and advised her per Dr. Burt Knack the DOD to take a metolazone on the morning prior to taking her lasix and to bring her in Monday 10/31/19 for an appt with Dr. Acie Fredrickson the DOD but she reports that she has been unable to ambulate. She has not been able to get up to the bedside commode and has been having to wear a diaper the past few days.   I asked if she is having pain and she says it hurts when she initially steps on her foot... she is having edema below her knee. She says she has a history of cellulitis in that left leg.   I advised her that I would strongly consider having EMS come and help take her to the ED.. I think she needs more immediate assessment especially since she says that she is so bad that she does not know if she could even make an appt here on Monday. She says this has all worsened to this point since this past Tuesday.   She says she will consider calling EMS but feels she wants to try the diuretic first. I advised her to continue to monitor and if she is unable to come in Monday and she is at the hospital to let us know and we will cancel her appt.

## 2019-10-30 ENCOUNTER — Encounter: Payer: Self-pay | Admitting: Cardiovascular Disease

## 2019-10-30 NOTE — Progress Notes (Addendum)
Error

## 2019-10-31 ENCOUNTER — Encounter: Payer: Self-pay | Admitting: Interventional Cardiology

## 2019-10-31 ENCOUNTER — Encounter: Payer: Self-pay | Admitting: *Deleted

## 2019-10-31 ENCOUNTER — Ambulatory Visit (INDEPENDENT_AMBULATORY_CARE_PROVIDER_SITE_OTHER): Payer: Medicare Other | Admitting: Interventional Cardiology

## 2019-10-31 ENCOUNTER — Other Ambulatory Visit: Payer: Self-pay

## 2019-10-31 ENCOUNTER — Telehealth: Payer: Self-pay | Admitting: Interventional Cardiology

## 2019-10-31 VITALS — BP 154/93 | HR 100 | Ht 66.0 in | Wt 135.0 lb

## 2019-10-31 DIAGNOSIS — N184 Chronic kidney disease, stage 4 (severe): Secondary | ICD-10-CM

## 2019-10-31 DIAGNOSIS — I4821 Permanent atrial fibrillation: Secondary | ICD-10-CM

## 2019-10-31 DIAGNOSIS — Z5181 Encounter for therapeutic drug level monitoring: Secondary | ICD-10-CM | POA: Diagnosis not present

## 2019-10-31 DIAGNOSIS — I5032 Chronic diastolic (congestive) heart failure: Secondary | ICD-10-CM | POA: Diagnosis not present

## 2019-10-31 DIAGNOSIS — Z7189 Other specified counseling: Secondary | ICD-10-CM

## 2019-10-31 DIAGNOSIS — E785 Hyperlipidemia, unspecified: Secondary | ICD-10-CM

## 2019-10-31 DIAGNOSIS — I10 Essential (primary) hypertension: Secondary | ICD-10-CM

## 2019-10-31 DIAGNOSIS — R2242 Localized swelling, mass and lump, left lower limb: Secondary | ICD-10-CM | POA: Diagnosis not present

## 2019-10-31 DIAGNOSIS — E0822 Diabetes mellitus due to underlying condition with diabetic chronic kidney disease: Secondary | ICD-10-CM

## 2019-10-31 DIAGNOSIS — I4891 Unspecified atrial fibrillation: Secondary | ICD-10-CM

## 2019-10-31 NOTE — Patient Instructions (Signed)
Medication Instructions:  Your physician recommends that you continue on your current medications as directed. Please refer to the Current Medication list given to you today.  *If you need a refill on your cardiac medications before your next appointment, please call your pharmacy*   Lab Work: BMET and CBC with [facility]  If you have labs (blood work) drawn today and your tests are completely normal, you will receive your results only by:  MyChart Message (if you have MyChart) OR  A paper copy in the mail If you have any lab test that is abnormal or we need to change your treatment, we will call you to review the results.   Testing/Procedures: None   Follow-Up:  Keep current follow up with Dr. Tamala Julian that is scheduled in December  Other Instructions

## 2019-10-31 NOTE — Telephone Encounter (Signed)
° ° °  Pt's sister calling, she said pt's health is declining and can;t get up and walk. Pt will be hard time to get coumadin check on Friday and would like to know if there's a way they can check INR at home

## 2019-10-31 NOTE — Telephone Encounter (Signed)
This encounter was created in error - please disregard.

## 2019-10-31 NOTE — Telephone Encounter (Signed)
Spoke with daughter and her wife is able to do a video visit.  Flipped appt to virtual and advised them to get vitals and we will call back shortly.

## 2019-10-31 NOTE — Telephone Encounter (Addendum)
[facility] Visit Initial Request  Date of Request ([place]):  October 31, 2019  Requesting Provider: Dr. Daneen Schick    Agency Requested:    [facility] Contact:  Glory Buff, NP [address] [place], [place] [address] Phone #:  [phone] Fax #:  [phone]  Patient Demographic Information: Name:  [name] Age:  86 y.o.   DOB:  02-10-1934  MRN:  [id number]   Address:   [address] [place]  [address]   Phone Numbers:   Home Phone [phone]     Emergency Contact Information on File:   Contact Information    Name Relation Home Work Mobile   Amyx,Nicole Daughter [phone]  [phone]   Clark,Antionette Sister [phone]  [phone]      The above family members may be contacted for information on this patient (review DPR on file):  Yes    Patient Clinical Information:  Primary Care Provider:  Aretta Nip, MD  Primary Cardiologist:  Sinclair Grooms, MD  Primary Electrophysiologist:  None   Past Medical Hx: [name]  has a past medical history of Anemia due to blood loss, chronic (08/23/2013), Atrial fibrillation ([place]) (12/09/2012), Candidal skin infection (12/23/2012), Chronic diastolic heart failure ([place]) (08/23/2013), Chronic kidney disease, stage IV (severe) ([place]) (12/09/2012), Diabetes mellitus with renal complications ([place]) (41/11/6220), Diabetes mellitus without complication ([place]), Dyslipidemia (12/09/2012), Fall at home (Sept. 3, 2016), GERD (gastroesophageal reflux disease) (12/23/2012), HTN (hypertension) (12/09/2012), Hyperlipidemia (12/23/2012), Hypertension, Ileus, postoperative ([place]) (12/17/2012), Intractable pain (12/03/2014), Irregular heart beat, Long term current use of anticoagulant therapy (02/22/2013), On amiodarone therapy (02/22/2013), Peptic esophagitis (08/23/2013), Pressure injury of skin (05/21/2016), Renal disorder, Type II diabetes mellitus, uncontrolled ([place]) (9/79/8921), and Umbilical  hernia, incarcerated - reducible (12/10/2012).   Allergies: She is allergic to lotensin [benazepril hcl], zantac [ranitidine hcl], and tape.   Medications: Current Outpatient Medications on File Prior to Visit  Medication Sig  . acidophilus (RISAQUAD) CAPS capsule Take 1 capsule by mouth daily as needed (GI issues).   Marland Kitchen atorvastatin (LIPITOR) 40 MG tablet Take 40 mg by mouth every evening.   . B-D UF III MINI PEN NEEDLES 31G X 5 MM MISC USE 3 PEN NEEDLES PER DAY  . colchicine 0.6 MG tablet Take 0.6 mg by mouth daily.   . Cranberry 250 MG CAPS Take 750 mg by mouth daily.   Marland Kitchen diltiazem (CARDIZEM CD) 180 MG 24 hr capsule TAKE 1 CAPSULE BY MOUTH EVERY DAY  . econazole nitrate 1 % cream Apply 1 application topically daily as needed (to groin rash).   Marland Kitchen esomeprazole (NEXIUM) 20 MG capsule Take 20 mg by mouth daily before breakfast.   . ferrous sulfate 325 (65 FE) MG tablet Take 325 mg by mouth daily with breakfast.   . fluticasone (FLONASE) 50 MCG/ACT nasal spray Place 2 sprays into both nostrils daily as needed for rhinitis.   . furosemide (LASIX) 80 MG tablet Take 1.5 tablets (120 mg total) by mouth daily.  . hydrALAZINE (APRESOLINE) 25 MG tablet TAKE 3 TABLETS (75 MG TOTAL) BY MOUTH 3 (THREE) TIMES DAILY. (Patient not taking: Reported on 10/12/2019)  . metolazone (ZAROXOLYN) 2.5 MG tablet TAKE 1 TABLET (2.5 MG TOTAL) BY MOUTH ONCE A WEEK.  . metoprolol tartrate (LOPRESSOR) 100 MG tablet TAKE 1 TABLET BY MOUTH TWICE A DAY  . ONETOUCH VERIO test strip USE 1 STRIP TO CHECK GLUCOSE TWICE DAILY AS DIRECTED  . polyethylene glycol (MIRALAX / GLYCOLAX) packet  Take 17 g by mouth daily as needed for mild constipation.   . potassium chloride (KLOR-CON) 10 MEQ tablet TAKE 3 TABLETS BY MOUTH EVERY MORNING AND TAKE 3 TABLETS BY MOUTH EVERY EVENING  . repaglinide (PRANDIN) 0.5 MG tablet 1 tab before breakfast and 2 before supper  . warfarin (COUMADIN) 5 MG tablet TAKE AS DIRECTED BY COUMADIN CLINIC   No  current facility-administered medications on file prior to visit.     Social Hx: She  reports that she has quit smoking. Her smoking use included cigarettes. She has never used smokeless tobacco. She reports that she does not drink alcohol and does not use drugs.    Diagnosis/Reason for Visit:   Pt is homebound and needs INR check for Warfarin monitoring, Pt also had a lab appointment 8/13 to have BMET and CBC checked.   Services Requested:  Labs:  PT/INR, BMET, CBC  # of Visits Needed/Frequency per Week: 11/03/2019  A copy of the office note will be faxed with this form. All labs ordered for this home visit have been released and the request was sent to Chrissie Noa at [facility].

## 2019-10-31 NOTE — Addendum Note (Signed)
Addended by: Johny Shock B on: 10/31/2019 10:44 AM   Modules accepted: Orders

## 2019-10-31 NOTE — Telephone Encounter (Signed)
Called and spoke to pt's daughter and sister. Informed them that [facility] should be coming out to check pt's INR and get blood work later this week. Cancelled pt's lab appointment and INR appointment.

## 2019-10-31 NOTE — Addendum Note (Signed)
Addended by: Johny Shock B on: 10/31/2019 01:28 PM   Modules accepted: Orders

## 2019-11-03 DIAGNOSIS — I4891 Unspecified atrial fibrillation: Secondary | ICD-10-CM | POA: Diagnosis not present

## 2019-11-03 DIAGNOSIS — I4821 Permanent atrial fibrillation: Secondary | ICD-10-CM | POA: Diagnosis not present

## 2019-11-03 DIAGNOSIS — N184 Chronic kidney disease, stage 4 (severe): Secondary | ICD-10-CM | POA: Diagnosis not present

## 2019-11-04 ENCOUNTER — Other Ambulatory Visit: Payer: Self-pay

## 2019-11-04 ENCOUNTER — Emergency Department (HOSPITAL_COMMUNITY): Payer: Medicare Other

## 2019-11-04 ENCOUNTER — Other Ambulatory Visit: Payer: Medicare Other

## 2019-11-04 ENCOUNTER — Encounter (HOSPITAL_COMMUNITY): Payer: Self-pay

## 2019-11-04 ENCOUNTER — Inpatient Hospital Stay (HOSPITAL_COMMUNITY)
Admission: EM | Admit: 2019-11-04 | Discharge: 2019-11-10 | DRG: 871 | Disposition: A | Discharge status: Hospice | Payer: Medicare Other | Attending: Family Medicine | Admitting: Family Medicine

## 2019-11-04 ENCOUNTER — Ambulatory Visit (INDEPENDENT_AMBULATORY_CARE_PROVIDER_SITE_OTHER): Payer: Medicare Other | Admitting: Interventional Cardiology

## 2019-11-04 DIAGNOSIS — E876 Hypokalemia: Secondary | ICD-10-CM | POA: Diagnosis not present

## 2019-11-04 DIAGNOSIS — Z515 Encounter for palliative care: Secondary | ICD-10-CM | POA: Diagnosis not present

## 2019-11-04 DIAGNOSIS — R404 Transient alteration of awareness: Secondary | ICD-10-CM | POA: Diagnosis not present

## 2019-11-04 DIAGNOSIS — Z9109 Other allergy status, other than to drugs and biological substances: Secondary | ICD-10-CM

## 2019-11-04 DIAGNOSIS — N179 Acute kidney failure, unspecified: Secondary | ICD-10-CM

## 2019-11-04 DIAGNOSIS — N139 Obstructive and reflux uropathy, unspecified: Secondary | ICD-10-CM | POA: Diagnosis not present

## 2019-11-04 DIAGNOSIS — Z888 Allergy status to other drugs, medicaments and biological substances status: Secondary | ICD-10-CM

## 2019-11-04 DIAGNOSIS — K219 Gastro-esophageal reflux disease without esophagitis: Secondary | ICD-10-CM | POA: Diagnosis present

## 2019-11-04 DIAGNOSIS — Z66 Do not resuscitate: Secondary | ICD-10-CM | POA: Diagnosis not present

## 2019-11-04 DIAGNOSIS — R6520 Severe sepsis without septic shock: Secondary | ICD-10-CM | POA: Diagnosis not present

## 2019-11-04 DIAGNOSIS — I4821 Permanent atrial fibrillation: Secondary | ICD-10-CM

## 2019-11-04 DIAGNOSIS — K5900 Constipation, unspecified: Secondary | ICD-10-CM | POA: Diagnosis present

## 2019-11-04 DIAGNOSIS — T45515A Adverse effect of anticoagulants, initial encounter: Secondary | ICD-10-CM | POA: Diagnosis present

## 2019-11-04 DIAGNOSIS — I482 Chronic atrial fibrillation, unspecified: Secondary | ICD-10-CM | POA: Diagnosis not present

## 2019-11-04 DIAGNOSIS — I4891 Unspecified atrial fibrillation: Secondary | ICD-10-CM | POA: Diagnosis not present

## 2019-11-04 DIAGNOSIS — M109 Gout, unspecified: Secondary | ICD-10-CM | POA: Diagnosis present

## 2019-11-04 DIAGNOSIS — Z5181 Encounter for therapeutic drug level monitoring: Secondary | ICD-10-CM

## 2019-11-04 DIAGNOSIS — N184 Chronic kidney disease, stage 4 (severe): Secondary | ICD-10-CM | POA: Diagnosis present

## 2019-11-04 DIAGNOSIS — E1129 Type 2 diabetes mellitus with other diabetic kidney complication: Secondary | ICD-10-CM | POA: Diagnosis present

## 2019-11-04 DIAGNOSIS — D500 Iron deficiency anemia secondary to blood loss (chronic): Secondary | ICD-10-CM | POA: Diagnosis present

## 2019-11-04 DIAGNOSIS — Z7189 Other specified counseling: Secondary | ICD-10-CM | POA: Diagnosis not present

## 2019-11-04 DIAGNOSIS — E43 Unspecified severe protein-calorie malnutrition: Secondary | ICD-10-CM

## 2019-11-04 DIAGNOSIS — E440 Moderate protein-calorie malnutrition: Secondary | ICD-10-CM | POA: Insufficient documentation

## 2019-11-04 DIAGNOSIS — N390 Urinary tract infection, site not specified: Secondary | ICD-10-CM | POA: Diagnosis not present

## 2019-11-04 DIAGNOSIS — Z7901 Long term (current) use of anticoagulants: Secondary | ICD-10-CM

## 2019-11-04 DIAGNOSIS — N19 Unspecified kidney failure: Secondary | ICD-10-CM

## 2019-11-04 DIAGNOSIS — K469 Unspecified abdominal hernia without obstruction or gangrene: Secondary | ICD-10-CM | POA: Diagnosis present

## 2019-11-04 DIAGNOSIS — G8929 Other chronic pain: Secondary | ICD-10-CM

## 2019-11-04 DIAGNOSIS — R627 Adult failure to thrive: Secondary | ICD-10-CM | POA: Diagnosis present

## 2019-11-04 DIAGNOSIS — D259 Leiomyoma of uterus, unspecified: Secondary | ICD-10-CM | POA: Diagnosis not present

## 2019-11-04 DIAGNOSIS — Z20822 Contact with and (suspected) exposure to covid-19: Secondary | ICD-10-CM | POA: Diagnosis not present

## 2019-11-04 DIAGNOSIS — R531 Weakness: Secondary | ICD-10-CM | POA: Diagnosis not present

## 2019-11-04 DIAGNOSIS — K7689 Other specified diseases of liver: Secondary | ICD-10-CM | POA: Diagnosis not present

## 2019-11-04 DIAGNOSIS — I130 Hypertensive heart and chronic kidney disease with heart failure and stage 1 through stage 4 chronic kidney disease, or unspecified chronic kidney disease: Secondary | ICD-10-CM | POA: Diagnosis present

## 2019-11-04 DIAGNOSIS — D6832 Hemorrhagic disorder due to extrinsic circulating anticoagulants: Secondary | ICD-10-CM

## 2019-11-04 DIAGNOSIS — E0822 Diabetes mellitus due to underlying condition with diabetic chronic kidney disease: Secondary | ICD-10-CM

## 2019-11-04 DIAGNOSIS — A4159 Other Gram-negative sepsis: Secondary | ICD-10-CM | POA: Diagnosis not present

## 2019-11-04 DIAGNOSIS — I499 Cardiac arrhythmia, unspecified: Secondary | ICD-10-CM | POA: Diagnosis not present

## 2019-11-04 DIAGNOSIS — Z87891 Personal history of nicotine dependence: Secondary | ICD-10-CM

## 2019-11-04 DIAGNOSIS — A419 Sepsis, unspecified organism: Secondary | ICD-10-CM | POA: Diagnosis present

## 2019-11-04 DIAGNOSIS — L03116 Cellulitis of left lower limb: Secondary | ICD-10-CM

## 2019-11-04 DIAGNOSIS — J189 Pneumonia, unspecified organism: Secondary | ICD-10-CM | POA: Diagnosis not present

## 2019-11-04 DIAGNOSIS — Z79899 Other long term (current) drug therapy: Secondary | ICD-10-CM

## 2019-11-04 DIAGNOSIS — E860 Dehydration: Secondary | ICD-10-CM | POA: Diagnosis present

## 2019-11-04 DIAGNOSIS — Z833 Family history of diabetes mellitus: Secondary | ICD-10-CM

## 2019-11-04 DIAGNOSIS — E1165 Type 2 diabetes mellitus with hyperglycemia: Secondary | ICD-10-CM | POA: Diagnosis not present

## 2019-11-04 DIAGNOSIS — N281 Cyst of kidney, acquired: Secondary | ICD-10-CM | POA: Diagnosis not present

## 2019-11-04 DIAGNOSIS — E1122 Type 2 diabetes mellitus with diabetic chronic kidney disease: Secondary | ICD-10-CM | POA: Diagnosis present

## 2019-11-04 DIAGNOSIS — E875 Hyperkalemia: Secondary | ICD-10-CM

## 2019-11-04 DIAGNOSIS — I517 Cardiomegaly: Secondary | ICD-10-CM | POA: Diagnosis not present

## 2019-11-04 DIAGNOSIS — I5032 Chronic diastolic (congestive) heart failure: Secondary | ICD-10-CM | POA: Diagnosis present

## 2019-11-04 DIAGNOSIS — J90 Pleural effusion, not elsewhere classified: Secondary | ICD-10-CM | POA: Diagnosis not present

## 2019-11-04 DIAGNOSIS — D689 Coagulation defect, unspecified: Secondary | ICD-10-CM | POA: Diagnosis present

## 2019-11-04 DIAGNOSIS — B964 Proteus (mirabilis) (morganii) as the cause of diseases classified elsewhere: Secondary | ICD-10-CM | POA: Diagnosis present

## 2019-11-04 DIAGNOSIS — K8020 Calculus of gallbladder without cholecystitis without obstruction: Secondary | ICD-10-CM | POA: Diagnosis not present

## 2019-11-04 DIAGNOSIS — E785 Hyperlipidemia, unspecified: Secondary | ICD-10-CM | POA: Diagnosis present

## 2019-11-04 LAB — CBC WITH DIFFERENTIAL/PLATELET
Abs Immature Granulocytes: 0.15 10*3/uL — ABNORMAL HIGH (ref 0.00–0.07)
Basophils Absolute: 0 10*3/uL (ref 0.0–0.1)
Basophils Relative: 0 %
Eosinophils Absolute: 0 10*3/uL (ref 0.0–0.5)
Eosinophils Relative: 0 %
HCT: 43.5 % (ref 36.0–46.0)
Hemoglobin: 13.6 g/dL (ref 12.0–15.0)
Immature Granulocytes: 1 %
Lymphocytes Relative: 7 %
Lymphs Abs: 1.1 10*3/uL (ref 0.7–4.0)
MCH: 27.3 pg (ref 26.0–34.0)
MCHC: 31.3 g/dL (ref 30.0–36.0)
MCV: 87.2 fL (ref 80.0–100.0)
Monocytes Absolute: 1.5 10*3/uL — ABNORMAL HIGH (ref 0.1–1.0)
Monocytes Relative: 9 %
Neutro Abs: 14.3 10*3/uL — ABNORMAL HIGH (ref 1.7–7.7)
Neutrophils Relative %: 83 %
Platelets: 334 10*3/uL (ref 150–400)
RBC: 4.99 MIL/uL (ref 3.87–5.11)
RDW: 15.3 % (ref 11.5–15.5)
WBC: 17.1 10*3/uL — ABNORMAL HIGH (ref 4.0–10.5)
nRBC: 0 % (ref 0.0–0.2)

## 2019-11-04 LAB — CBC
Hematocrit: 43.1 % (ref 34.0–46.6)
Hemoglobin: 13 g/dL (ref 11.1–15.9)
MCH: 26.7 pg (ref 26.6–33.0)
MCHC: 30.2 g/dL — ABNORMAL LOW (ref 31.5–35.7)
MCV: 89 fL (ref 79–97)
Platelets: 323 10*3/uL (ref 150–450)
RBC: 4.86 x10E6/uL (ref 3.77–5.28)
RDW: 13.7 % (ref 11.7–15.4)
WBC: 13.4 10*3/uL — ABNORMAL HIGH (ref 3.4–10.8)

## 2019-11-04 LAB — COMPREHENSIVE METABOLIC PANEL
ALT: 18 U/L (ref 0–44)
AST: 25 U/L (ref 15–41)
Albumin: 2.6 g/dL — ABNORMAL LOW (ref 3.5–5.0)
Alkaline Phosphatase: 52 U/L (ref 38–126)
Anion gap: 14 (ref 5–15)
BUN: 143 mg/dL — ABNORMAL HIGH (ref 8–23)
CO2: 18 mmol/L — ABNORMAL LOW (ref 22–32)
Calcium: 9.3 mg/dL (ref 8.9–10.3)
Chloride: 103 mmol/L (ref 98–111)
Creatinine, Ser: 3.36 mg/dL — ABNORMAL HIGH (ref 0.44–1.00)
GFR calc Af Amer: 14 mL/min — ABNORMAL LOW (ref >60)
GFR calc non Af Amer: 12 mL/min — ABNORMAL LOW (ref >60)
Glucose, Bld: 333 mg/dL — ABNORMAL HIGH (ref 70–99)
Potassium: 7.5 mmol/L (ref 3.5–5.1)
Sodium: 135 mmol/L (ref 135–145)
Total Bilirubin: 1.2 mg/dL (ref 0.3–1.2)
Total Protein: 6.8 g/dL (ref 6.5–8.1)

## 2019-11-04 LAB — SARS CORONAVIRUS 2 BY RT PCR (HOSPITAL ORDER, PERFORMED IN ~~LOC~~ HOSPITAL LAB): SARS Coronavirus 2: NEGATIVE

## 2019-11-04 LAB — LACTIC ACID, PLASMA
Lactic Acid, Venous: 2.8 mmol/L (ref 0.5–1.9)
Lactic Acid, Venous: 3.8 mmol/L (ref 0.5–1.9)

## 2019-11-04 LAB — URINALYSIS, ROUTINE W REFLEX MICROSCOPIC
Bacteria, UA: NONE SEEN
Bilirubin Urine: NEGATIVE
Glucose, UA: 50 mg/dL — AB
Ketones, ur: NEGATIVE mg/dL
Leukocytes,Ua: NEGATIVE
Nitrite: NEGATIVE
Protein, ur: 30 mg/dL — AB
Specific Gravity, Urine: 1.011 (ref 1.005–1.030)
pH: 5 (ref 5.0–8.0)

## 2019-11-04 LAB — SPECIMEN STATUS REPORT

## 2019-11-04 LAB — PROTIME-INR
INR: 4.9 — ABNORMAL HIGH (ref 0.9–1.2)
INR: 8.3 (ref 0.8–1.2)
Prothrombin Time: 48.3 s — ABNORMAL HIGH (ref 9.1–12.0)
Prothrombin Time: 67.1 seconds — ABNORMAL HIGH (ref 11.4–15.2)

## 2019-11-04 MED ORDER — ONDANSETRON HCL 4 MG/2ML IJ SOLN: 4.0000 mg | Freq: Four times a day (QID) | INTRAMUSCULAR | Status: DC | PRN

## 2019-11-04 MED ORDER — CALCIUM CHLORIDE 10 % IV SOLN: 1.0000 g | Freq: Once | INTRAVENOUS | Status: DC

## 2019-11-04 MED ORDER — SODIUM CHLORIDE 0.9 % IV SOLN: INTRAVENOUS | Status: AC

## 2019-11-04 MED ORDER — INSULIN ASPART 100 UNIT/ML ~~LOC~~ SOLN
6.0000 [IU] | Freq: Once | SUBCUTANEOUS | Status: AC
  Administered 2019-11-04: 6 [IU] via INTRAVENOUS

## 2019-11-04 MED ORDER — DILTIAZEM LOAD VIA INFUSION
10.0000 mg | Freq: Once | INTRAVENOUS | Status: AC
  Administered 2019-11-04: 10 mg via INTRAVENOUS
  Filled 2019-11-04: qty 10

## 2019-11-04 MED ORDER — SODIUM ZIRCONIUM CYCLOSILICATE 10 G PO PACK: 10.0000 g | PACK | Freq: Once | ORAL | Status: DC

## 2019-11-04 MED ORDER — SODIUM BICARBONATE 8.4 % IV SOLN
50.0000 meq | Freq: Once | INTRAVENOUS | Status: AC
  Administered 2019-11-04: 50 meq via INTRAVENOUS
  Filled 2019-11-04: qty 50

## 2019-11-04 MED ORDER — ONDANSETRON HCL 4 MG PO TABS: 4.0000 mg | ORAL_TABLET | Freq: Four times a day (QID) | ORAL | Status: DC | PRN

## 2019-11-04 MED ORDER — INSULIN ASPART 100 UNIT/ML ~~LOC~~ SOLN
0.0000 [IU] | SUBCUTANEOUS | Status: DC
  Administered 2019-11-05: 3 [IU] via SUBCUTANEOUS
  Administered 2019-11-05 (×2): 5 [IU] via SUBCUTANEOUS

## 2019-11-04 MED ORDER — METOPROLOL TARTRATE 100 MG PO TABS
100.0000 mg | ORAL_TABLET | Freq: Two times a day (BID) | ORAL | Status: DC
  Administered 2019-11-05 (×2): 100 mg via ORAL
  Filled 2019-11-04 (×2): qty 1

## 2019-11-04 MED ORDER — DILTIAZEM HCL ER COATED BEADS 180 MG PO CP24
180.0000 mg | ORAL_CAPSULE | Freq: Every day | ORAL | Status: DC
  Administered 2019-11-05: 180 mg via ORAL
  Filled 2019-11-04: qty 1

## 2019-11-04 MED ORDER — SODIUM CHLORIDE 0.9 % IV BOLUS
500.0000 mL | Freq: Once | INTRAVENOUS | Status: AC
  Administered 2019-11-04: 500 mL via INTRAVENOUS

## 2019-11-04 MED ORDER — SODIUM CHLORIDE 0.9 % IV SOLN
1.0000 g | Freq: Once | INTRAVENOUS | Status: AC
  Administered 2019-11-04: 1 g via INTRAVENOUS
  Filled 2019-11-04: qty 10

## 2019-11-04 MED ORDER — DILTIAZEM HCL-DEXTROSE 125-5 MG/125ML-% IV SOLN (PREMIX)
5.0000 mg/h | INTRAVENOUS | Status: AC
  Administered 2019-11-04: 5 mg/h via INTRAVENOUS
  Administered 2019-11-05: 10 mg/h via INTRAVENOUS
  Filled 2019-11-04 (×3): qty 125

## 2019-11-04 MED ORDER — VANCOMYCIN HCL 750 MG/150ML IV SOLN
750.0000 mg | INTRAVENOUS | Status: DC
  Administered 2019-11-04: 750 mg via INTRAVENOUS
  Filled 2019-11-04: qty 150

## 2019-11-04 MED ORDER — VANCOMYCIN HCL IN DEXTROSE 1-5 GM/200ML-% IV SOLN: 1000.0000 mg | Freq: Once | INTRAVENOUS | Status: DC

## 2019-11-04 MED ORDER — SODIUM CHLORIDE 0.9 % IV BOLUS
1000.0000 mL | Freq: Once | INTRAVENOUS | Status: AC
  Administered 2019-11-04: 1000 mL via INTRAVENOUS

## 2019-11-04 MED ORDER — DEXTROSE 50 % IV SOLN
25.0000 g | Freq: Once | INTRAVENOUS | Status: AC
  Administered 2019-11-04: 25 g via INTRAVENOUS
  Filled 2019-11-04: qty 50

## 2019-11-04 MED ORDER — ATORVASTATIN CALCIUM 40 MG PO TABS
40.0000 mg | ORAL_TABLET | Freq: Every day | ORAL | Status: DC
  Administered 2019-11-05: 40 mg via ORAL
  Filled 2019-11-04: qty 1

## 2019-11-04 MED ORDER — PIPERACILLIN-TAZOBACTAM 3.375 G IVPB 30 MIN
3.3750 g | Freq: Once | INTRAVENOUS | Status: AC
  Administered 2019-11-04: 3.375 g via INTRAVENOUS
  Filled 2019-11-04: qty 50

## 2019-11-04 NOTE — ED Triage Notes (Signed)
Pt BIB [facility]. Pt was being evaluated at home for hospice due to gradual decline in health and failure to thrive. Hospice nurse felt like pt needed to be seen in the ED. EMS states pt was tachy and had a possible foul urine smell. Pt is alert and oriented x2. Pt denies any pain but states she does not feel good.

## 2019-11-04 NOTE — Progress Notes (Signed)
Virtual Visit via Video Note   This visit type was conducted due to national recommendations for restrictions regarding the COVID-19 Pandemic (e.g. social distancing) in an effort to limit this patient's exposure and mitigate transmission in our community.  Due to her co-morbid illnesses, this patient is at least at moderate risk for complications without adequate follow up.  This format is felt to be most appropriate for this patient at this time.  All issues noted in this document were discussed and addressed.  A limited physical exam was performed with this format.  Please refer to the patient's chart for her consent to telehealth for [facility].       Date:  10/31/2019   ID:  [name], DOB 1933/07/19, MRN [id number] The patient was identified using 2 identifiers.  Patient Location: Home Provider Location: Office/Clinic  PCP:  Aretta Nip, MD  Cardiologist:  Sinclair Grooms, MD  Electrophysiologist:  None   Evaluation Performed:  Follow-Up Visit  Chief Complaint: Left lower extremity swelling  History of Present Illness:    [name] is a 86 y.o. female with chronic atrial fib, rate control, CKD IV, hypertension, chronic diastolic heart failure with predominant right heart failure, elderly and frail.  Being seen today virtually because the patient is too weak to come into the office.  [name], her sister, and daughter are present for the virtual visit.  The patient has become bedridden.  She has disproportionate swelling in the left lower extremity compared to the right.  She is not able to support herself when she stands because of generalized weakness.  Diuretic therapy was decreased about 1 week ago because of increasing BUN.  She is not having any chest pain or shortness of breath.  She does have decreased appetite.  Last week a venous Doppler study was performed and did not reveal evidence of DVT.  This information is based upon report by the patient's  family.  She has been on continuous anticoagulation therapy.  Unable to come to clinic for INR evaluation.  The family denies chills, fever, and rigors.  The patient recognizes me.  Her cyst [name] and daughter are also present.  The patient appears somewhat cachectic.  We did have a conversation concerning palliative care and hospice.  The patient is becoming increasingly frail and I am concerned about her 6 to 40-month prognosis.  The patient does not have symptoms concerning for COVID-19 infection (fever, chills, cough, or new shortness of breath).    Past Medical History:  Diagnosis Date   Anemia due to blood loss, chronic 08/23/2013   Atrial fibrillation ([place]) 12/09/2012   Candidal skin infection 12/23/2012   Chronic diastolic heart failure ([place]) 08/23/2013   Chronic kidney disease, stage IV (severe) ([place]) 12/09/2012   Diabetes mellitus with renal complications ([place]) 67/04/945   Diabetes mellitus without complication ([place])    Dyslipidemia 12/09/2012   Fall at home Sept. 3, 2016   Fx  3 ribs   GERD (gastroesophageal reflux disease) 12/23/2012   HTN (hypertension) 12/09/2012   Hyperlipidemia 12/23/2012   Hypertension    Ileus, postoperative ([place]) 12/17/2012   Intractable pain 12/03/2014   Irregular heart beat    Long term current use of anticoagulant therapy 02/22/2013   On amiodarone therapy 02/22/2013   Peptic esophagitis 08/23/2013   Pressure injury of skin 05/21/2016   Renal disorder    Type II diabetes mellitus, uncontrolled ([place]) 0/96/2836   Umbilical hernia, incarcerated - reducible 12/10/2012  Past Surgical History:  Procedure Laterality Date   BREAST LUMPECTOMY Right 2001   2 lumpectomies with radiation both 2001   CATARACT EXTRACTION, BILATERAL     HERNIA REPAIR     I & D EXTREMITY Right 05/19/2016   Procedure: MINOR IRRIGATION AND DEBRIDEMENT Right great toe;  Surgeon: Gaynelle Arabian, MD;  Location: [facility];  Service: Orthopedics;  Laterality:  Right;   INSERTION OF MESH N/A 12/12/2012   Procedure: INSERTION OF MESH;  Surgeon: Madilyn Hook, DO;  Location: [facility];  Service: General;  Laterality: N/A;   LAPAROSCOPIC LYSIS OF ADHESIONS N/A 12/12/2012   Procedure: LAPAROSCOPIC LYSIS OF ADHESIONS;  Surgeon: Madilyn Hook, DO;  Location: [facility];  Service: General;  Laterality: N/A;   Right Lumpectomy     VENTRAL HERNIA REPAIR N/A 12/12/2012   Procedure: LAPAROSCOPIC VENTRAL HERNIA;  Surgeon: Madilyn Hook, DO;  Location: [facility];  Service: General;  Laterality: N/A;     Current Meds  Medication Sig   acidophilus (RISAQUAD) CAPS capsule Take 1 capsule by mouth daily as needed (GI issues).    atorvastatin (LIPITOR) 40 MG tablet Take 40 mg by mouth every evening.    B-D UF III MINI PEN NEEDLES 31G X 5 MM MISC USE 3 PEN NEEDLES PER DAY   Cranberry 250 MG CAPS Take 750 mg by mouth daily.    diltiazem (CARDIZEM CD) 180 MG 24 hr capsule TAKE 1 CAPSULE BY MOUTH EVERY DAY   econazole nitrate 1 % cream Apply 1 application topically daily as needed (to groin rash).    esomeprazole (NEXIUM) 20 MG capsule Take 20 mg by mouth daily before breakfast.    ferrous sulfate 325 (65 FE) MG tablet Take 325 mg by mouth daily with breakfast.    fluticasone (FLONASE) 50 MCG/ACT nasal spray Place 2 sprays into both nostrils daily as needed for rhinitis.    furosemide (LASIX) 80 MG tablet Take 1.5 tablets (120 mg total) by mouth daily.   metolazone (ZAROXOLYN) 2.5 MG tablet TAKE 1 TABLET (2.5 MG TOTAL) BY MOUTH ONCE A WEEK.   metoprolol tartrate (LOPRESSOR) 100 MG tablet TAKE 1 TABLET BY MOUTH TWICE A DAY   ONETOUCH VERIO test strip USE 1 STRIP TO CHECK GLUCOSE TWICE DAILY AS DIRECTED   polyethylene glycol (MIRALAX / GLYCOLAX) packet Take 17 g by mouth daily as needed for mild constipation.    potassium chloride (KLOR-CON) 10 MEQ tablet TAKE 3 TABLETS BY MOUTH EVERY MORNING AND TAKE 3 TABLETS BY MOUTH EVERY EVENING   repaglinide (PRANDIN) 0.5 MG  tablet 1 tab before breakfast and 2 before supper   warfarin (COUMADIN) 5 MG tablet TAKE AS DIRECTED BY COUMADIN CLINIC     Allergies:   Lotensin [benazepril hcl], Zantac [ranitidine hcl], and Tape   Social History   Tobacco Use   Smoking status: Former Smoker    Types: Cigarettes   Smokeless tobacco: Never Used  Scientific laboratory technician Use: Never used  Substance Use Topics   Alcohol use: No   Drug use: No     Family Hx: The patient's family history includes Diabetes in her mother and sister.  ROS:   Please see the history of present illness.    She has received an mRNA COVID-19 vaccination.  According to family there is no foul odor coming from her left leg.  There is no drainage.  The leg feels cool. All other systems reviewed and are negative.   Prior CV studies:   The  following studies were reviewed today:  No new data  Labs/Other Tests and Data Reviewed:    EKG:  No ECG reviewed.  Recent Labs: 02/08/2019: TSH 2.800 03/11/2019: NT-Pro BNP 19,319 06/13/2019: ALT 25; Hemoglobin 12.1; Platelets 195.0 10/21/2019: BUN 94; Creatinine, Ser 2.38; Potassium 5.1; Sodium 133   Recent Lipid Panel Lab Results  Component Value Date/Time   CHOL 68 10/06/2019 12:00 AM   TRIG 68 10/06/2019 12:00 AM   HDL 33 (A) 10/06/2019 12:00 AM   CHOLHDL 2 06/13/2019 09:26 AM   LDLCALC 20 10/06/2019 12:00 AM   LDLDIRECT 37.8 10/27/2013 08:08 AM    Wt Readings from Last 3 Encounters:  10/31/19 135 lb (61.2 kg)  09/13/19 133 lb (60.3 kg)  06/13/19 146 lb 9.6 oz (66.5 kg)     Objective:    Vital Signs:  BP (!) 154/93    Pulse 100    Ht 5\' 6"  (1.676 m)    Wt 135 lb (61.2 kg)    BMI 21.79 kg/m    VITAL SIGNS:  reviewed GEN:  Appears frail but alert.  She is lying in bed.  She is propped on pillows. EYES:  No jaundice is noted. RESPIRATORY:  normal respiratory effort, symmetric expansion CARDIOVASCULAR:  Clearly increased size of left lower extremity with ecchymoses and skin  lichenification related to chronic edema.  Right leg also has dermatitis-like changes but is almost completely normal in size.  ASSESSMENT & PLAN:    1. Localized swelling of left lower extremity   2. Chronic diastolic heart failure (HCC)   3. Permanent atrial fibrillation ([place])   4. Encounter for therapeutic drug monitoring   5. Essential hypertension   6. Chronic kidney disease, stage IV (severe) ([place])   7. Diabetes mellitus due to underlying condition with stage 4 chronic kidney disease, without long-term current use of insulin ([place])   8. Dyslipidemia   9. Educated about COVID-19 virus infection    PLAN:  1. The swelling is unilateral.  Likely related to venous insufficiency plus minus combination of arterial insufficiency and potential cellulitis.  Needs wound center management but patient is not able to get to clinic as she was not able to come into our office today. 2. The left lower extremity swelling is not related to volume overload.  A basic metabolic panel and CBC will be obtained this week. 3. Continue anticoagulation therapy.  INR will be done this coming week. 4. PT/INR will be done this week at the same time as of the laboratory data. 5. Blood pressure is elevated based upon home recordings. 6. Kidney function will be reassessed with the basic metabolic panel that will be done later this week. 7. Did not discuss diabetes. 8. Did not discuss lipids. 9. She has been vaccinated.  We discussed goals of care.  They want to improve help for the patient at home.  I am concerned about her near to intermediate term prognosis.  Palliative care and hospice should be considered.  We had conversation concerning this during the video visit.  The patient would be in favor of that.  She is familiar with the program as her husband was on hospice prior to his death.  The sister and daughter agree.  This should likely be set up by the patient's primary care physician Dr. Harlene Ramus.  COVID-19 Education: The signs and symptoms of COVID-19 were discussed with the patient and how to seek care for testing (follow up with PCP or arrange E-visit).  The  importance of social distancing was discussed today.  Time:   Today, I have spent 25 minutes with the patient with telehealth technology discussing the above problems.     Medication Adjustments/Labs and Tests Ordered: Current medicines are reviewed at length with the patient today.  Concerns regarding medicines are outlined above.   Tests Ordered: No orders of the defined types were placed in this encounter.   Medication Changes: No orders of the defined types were placed in this encounter.   Follow Up:  Virtual Visit  prn  Signed, Sinclair Grooms, MD  10/31/2019 1:21 PM    [place]

## 2019-11-04 NOTE — Patient Instructions (Signed)
Description   Hold warfarin today and tomorrow, then start taking 1 tablet daily excpet for 1/2 a tablet on Fridays. Recheck INR in 1 week. [facility] [phone].

## 2019-11-04 NOTE — Progress Notes (Signed)
Pharmacy Antibiotic Note  [name] is a 86 y.o. female with a history of CKD stage 4, atrial fibrillation, hypertension and diastolic heart failure admitted on 11/04/2019 with cellulitis. The patient has received a one-time dose of piperacillin/tazobactam 3.375 g. Pharmacy has been consulted for vancomycin dosing.  Plan: - Vancomycin 750 mg q48h, no loading dose - Order vancomycin troughs at steady state when indicated - Target a goal trough of 10-15 mcg/mL - Monitor renal function, clinical status, cultures and length of therapy - Deescalate therapy as clinically indicated  A vancomycin loading dose was not ordered due to the patient's antibiotic indication of cellulitis and current renal function with CKD stage 4.  Temp (24hrs), Avg:98.1 F (36.7 C), Min:98.1 F (36.7 C), Max:98.1 F (36.7 C)  Recent Labs  Lab 11/03/19 0000  WBC 13.4*  CREATININE 2.91*    Estimated Creatinine Clearance: 13.5 mL/min (A) (by C-G formula based on SCr of 2.91 mg/dL (H)).    Allergies  Allergen Reactions  . Lotensin [Benazepril Hcl] Anaphylaxis  . Zantac [Ranitidine Hcl] Anaphylaxis  . Tape Itching and Rash    Antimicrobials this admission: 8/13 Zosyn x 1 dose 8/13 vancomycin >>  Microbiology results: 8/13 BCx: sent  Thank you for allowing pharmacy to be a part of this patient's care.  Shauna Hugh, PharmD, [place]  PGY-1 Pharmacy Resident 11/04/2019 5:40 PM  Please check [email or web address] for unit-specific pharmacy phone numbers.

## 2019-11-04 NOTE — ED Notes (Signed)
Pt transported to ultrasound.

## 2019-11-04 NOTE — ED Notes (Addendum)
Paged Dr. Hal Hope about pt's bladder scan being greater than 930. Per Dr. Hal Hope, he wants an In and Out done on the pt and Q4h bladder scans.

## 2019-11-04 NOTE — H&P (Signed)
History and Physical    [name]:[id number] DOB: 08/14/1933 DOA: 11/04/2019  PCP: Aretta Nip, MD  Patient coming from: Home.  History obtained from patient's daughter.  Chief Complaint: Increasing weakness poor appetite.  HPI: [name] is a 86 y.o. female with history of A. fib, chronic kidney disease stage IV, chronic diastolic CHF, diabetes mellitus type 2 has been recently doing poorly with poor appetite eating very less becoming more weak and confused.  Recently was seen by cardiologist Dr. Tamala Julian at that time patient's spironolactone was discontinued because of worsening renal function.  Dr. Tamala Julian also recommended hospice consult and hospice team had visited patient today and was found to be very weak and was advised to come to the ER.  ED Course: In the ER patient appears generally weak with increasing lower extremity swelling and warmth which has been ongoing for last few days and also was present when cardiology was evaluating the patient last week.  In the ER patient was in A. fib with RVR for which patient was started on Cardizem infusion.  There was concern for sepsis and empiric antibiotic was started source could be from left lower extremity cellulitis.  Patient labs significant for hyperkalemia of 7.5 with worsening renal function with creatinine of 3.3 mild leukocytosis albumin was 2.6 Covid test was negative.  Patient was started on Cardizem infusion empiric antibiotics admitted for possible sepsis with worsening renal function and dehydration.  For the hyperkalemia patient was given Lokelma IV bicarbonate calcium gluconate D50 and insulin.  INR was 8.3.  Review of Systems: As per HPI, rest all negative.   Past Medical History:  Diagnosis Date  . Anemia due to blood loss, chronic 08/23/2013  . Atrial fibrillation ([place]) 12/09/2012  . Candidal skin infection 12/23/2012  . Chronic diastolic heart failure ([place]) 08/23/2013  . Chronic kidney disease, stage IV  (severe) ([place]) 12/09/2012  . Diabetes mellitus with renal complications ([place]) 75/03/6999  . Diabetes mellitus without complication ([place])   . Dyslipidemia 12/09/2012  . Fall at home Sept. 3, 2016   Fx  3 ribs  . GERD (gastroesophageal reflux disease) 12/23/2012  . HTN (hypertension) 12/09/2012  . Hyperlipidemia 12/23/2012  . Hypertension   . Ileus, postoperative ([place]) 12/17/2012  . Intractable pain 12/03/2014  . Irregular heart beat   . Long term current use of anticoagulant therapy 02/22/2013  . On amiodarone therapy 02/22/2013  . Peptic esophagitis 08/23/2013  . Pressure injury of skin 05/21/2016  . Renal disorder   . Type II diabetes mellitus, uncontrolled ([place]) 09/06/2013  . Umbilical hernia, incarcerated - reducible 12/10/2012    Past Surgical History:  Procedure Laterality Date  . BREAST LUMPECTOMY Right 2001   2 lumpectomies with radiation both 2001  . CATARACT EXTRACTION, BILATERAL    . HERNIA REPAIR    . I & D EXTREMITY Right 05/19/2016   Procedure: MINOR IRRIGATION AND DEBRIDEMENT Right great toe;  Surgeon: Gaynelle Arabian, MD;  Location: [facility];  Service: Orthopedics;  Laterality: Right;  . INSERTION OF MESH N/A 12/12/2012   Procedure: INSERTION OF MESH;  Surgeon: Madilyn Hook, DO;  Location: [facility];  Service: General;  Laterality: N/A;  . LAPAROSCOPIC LYSIS OF ADHESIONS N/A 12/12/2012   Procedure: LAPAROSCOPIC LYSIS OF ADHESIONS;  Surgeon: Madilyn Hook, DO;  Location: [facility];  Service: General;  Laterality: N/A;  . Right Lumpectomy    . VENTRAL HERNIA REPAIR N/A 12/12/2012   Procedure: LAPAROSCOPIC VENTRAL HERNIA;  Surgeon: Madilyn Hook,  DO;  Location: [facility];  Service: General;  Laterality: N/A;     reports that she has quit smoking. Her smoking use included cigarettes. She has never used smokeless tobacco. She reports that she does not drink alcohol and does not use drugs.  Allergies  Allergen Reactions  . Lotensin [Benazepril Hcl] Anaphylaxis  . Zantac [Ranitidine Hcl]  Anaphylaxis  . Tape Itching and Rash    Please use paper tape    Family History  Problem Relation Age of Onset  . Diabetes Mother   . Diabetes Sister     Prior to Admission medications   Medication Sig Start Date End Date Taking? Authorizing Provider  acetaminophen (TYLENOL) 500 MG tablet Take 1,000 mg by mouth every 6 (six) hours as needed (pain).   Yes [provider]  atorvastatin (LIPITOR) 40 MG tablet Take 40 mg by mouth daily.    Yes [provider]  AZO-CRANBERRY PO Take 3 tablets by mouth at bedtime.   Yes [provider]  cetirizine (ZYRTEC) 10 MG tablet Take 10 mg by mouth daily as needed (seasonal allergies).   Yes [provider]  diltiazem (CARDIZEM CD) 180 MG 24 hr capsule TAKE 1 CAPSULE BY MOUTH EVERY DAY Patient taking differently: Take 180 mg by mouth daily.  04/05/19  Yes Belva Crome, MD  econazole nitrate 1 % cream Apply 1 application topically daily as needed (groin rash).    Yes [provider]  esomeprazole (NEXIUM) 20 MG capsule Take 20 mg by mouth daily as needed (indigestion/heartburn).    Yes [provider]  ferrous sulfate 325 (65 FE) MG tablet Take 325 mg by mouth 2 (two) times daily with a meal.    Yes [provider]  fluticasone (FLONASE) 50 MCG/ACT nasal spray Place 2 sprays into both nostrils daily as needed (seasonal allergies).    Yes [provider]  furosemide (LASIX) 80 MG tablet Take 1.5 tablets (120 mg total) by mouth daily. 10/21/19  Yes Belva Crome, MD  metolazone (ZAROXOLYN) 2.5 MG tablet TAKE 1 TABLET (2.5 MG TOTAL) BY MOUTH ONCE A WEEK. Patient taking differently: Take 2.5 mg by mouth every Thursday.  07/22/19  Yes Belva Crome, MD  metoprolol tartrate (LOPRESSOR) 100 MG tablet TAKE 1 TABLET BY MOUTH TWICE A DAY Patient taking differently: Take 100 mg by mouth 2 (two) times daily.  10/04/19  Yes Belva Crome, MD  Multiple Vitamin (MULTIVITAMIN WITH MINERALS) TABS  tablet Take 1 tablet by mouth daily. Centrum Silver   Yes [provider]  polyethylene glycol (MIRALAX / GLYCOLAX) packet Take 17 g by mouth daily as needed for mild constipation.    Yes [provider]  potassium chloride (KLOR-CON) 10 MEQ tablet TAKE 3 TABLETS BY MOUTH EVERY MORNING AND TAKE 3 TABLETS BY MOUTH EVERY EVENING Patient taking differently: Take 30 mEq by mouth 2 (two) times daily with a meal.  02/08/19  Yes Belva Crome, MD  Probiotic Product (ALIGN PO) Take 1 capsule by mouth daily as needed (GI issues).   Yes [provider]  repaglinide (PRANDIN) 0.5 MG tablet 1 tab before breakfast and 2 before supper Patient taking differently: Take 0.5-1 mg by mouth See admin instructions. Take one tablet (0.5 mg) by mouth daily before breakfast and two tablets (1 mg) before supper 09/13/19  Yes Elayne Snare, MD  B-D UF III MINI PEN NEEDLES 31G X 5 MM MISC USE 3 PEN NEEDLES PER DAY 06/10/16  Elayne Snare, MD  [facility] VERIO test strip USE 1 STRIP TO CHECK GLUCOSE TWICE DAILY AS DIRECTED 02/03/19   Elayne Snare, MD  warfarin (COUMADIN) 5 MG tablet TAKE AS DIRECTED BY COUMADIN CLINIC Patient taking differently: Take 2.5-5 mg by mouth See admin instructions. Hold dose 8/13 and 8/14 - then resume taking with the following directions: take 1/2 tablet (2.5 mg) on Friday evenings and take 1 tablet (5 mg) on all other nights of the week.- or as directed by coumadin clinic. 03/09/19   Belva Crome, MD    Physical Exam: Constitutional: Moderately built and nourished. Vitals:   11/04/19 2115 11/04/19 2145 11/04/19 2215 11/04/19 2245  BP: (!) 144/80 (!) 149/70 139/78 (!) 142/92  Pulse: (!) 101 (!) 102 (!) 105 (!) 103  Resp: (!) 21 (!) 30 17 19   Temp:   97.6 F (36.4 C)   TempSrc:   Axillary   SpO2: 99% 96% 97% 96%   Eyes: Anicteric no pallor. ENMT: No discharge from the ears eyes nose or mouth. Neck: No mass felt.  No neck rigidity. Respiratory: No rhonchi or  crepitations. Cardiovascular: S1-S2 heard. Abdomen: Soft distended abdomen. Musculoskeletal: Left lower extremity edema. Skin: Chronic skin changes in left lower extremity. Neurologic: Lethargic but answers all questions and moves all extremities.  Oriented to her name. Psychiatric: Lethargic.   Labs on Admission: I have personally reviewed following labs and imaging studies  CBC: Recent Labs  Lab 11/03/19 0000 11/04/19 1720  WBC 13.4* 17.1*  NEUTROABS  --  14.3*  HGB 13.0 13.6  HCT 43.1 43.5  MCV 89 87.2  PLT 323 734   Basic Metabolic Panel: Recent Labs  Lab 11/03/19 0000 11/04/19 1720  NA WILL FOLLOW 135  K WILL FOLLOW 7.5*  CL WILL FOLLOW 103  CO2 WILL FOLLOW 18*  GLUCOSE 159* 333*  BUN 99* 143*  CREATININE 2.91* 3.36*  CALCIUM WILL FOLLOW 9.3   GFR: Estimated Creatinine Clearance: 11.7 mL/min (A) (by C-G formula based on SCr of 3.36 mg/dL (H)). Liver Function Tests: Recent Labs  Lab 11/04/19 1720  AST 25  ALT 18  ALKPHOS 52  BILITOT 1.2  PROT 6.8  ALBUMIN 2.6*   No results for input(s): LIPASE, AMYLASE in the last 168 hours. No results for input(s): AMMONIA in the last 168 hours. Coagulation Profile: Recent Labs  Lab 11/03/19 0000 11/04/19 1720  INR 4.9* 8.3*   Cardiac Enzymes: No results for input(s): CKTOTAL, CKMB, CKMBINDEX, TROPONINI in the last 168 hours. BNP (last 3 results) Recent Labs    03/11/19 1015  PROBNP 19,319*   HbA1C: No results for input(s): HGBA1C in the last 72 hours. CBG: No results for input(s): GLUCAP in the last 168 hours. Lipid Profile: No results for input(s): CHOL, HDL, LDLCALC, TRIG, CHOLHDL, LDLDIRECT in the last 72 hours. Thyroid Function Tests: No results for input(s): TSH, T4TOTAL, FREET4, T3FREE, THYROIDAB in the last 72 hours. Anemia Panel: No results for input(s): VITAMINB12, FOLATE, FERRITIN, TIBC, IRON, RETICCTPCT in the last 72 hours. Urine analysis:    Component Value Date/Time   COLORURINE  YELLOW 11/04/2019 2201   APPEARANCEUR CLEAR 11/04/2019 2201   LABSPEC 1.011 11/04/2019 2201   PHURINE 5.0 11/04/2019 2201   GLUCOSEU 50 (A) 11/04/2019 2201   GLUCOSEU NEGATIVE 09/11/2015 1041   HGBUR SMALL (A) 11/04/2019 2201   BILIRUBINUR NEGATIVE 11/04/2019 2201   BILIRUBINUR Neg 07/27/2014 1106   KETONESUR NEGATIVE 11/04/2019 2201   PROTEINUR 30 (A) 11/04/2019 2201   UROBILINOGEN  0.2 09/11/2015 1041   NITRITE NEGATIVE 11/04/2019 2201   LEUKOCYTESUR NEGATIVE 11/04/2019 2201   Sepsis Labs: @LABRCNTIP (procalcitonin:4,lacticidven:4) ) Recent Results (from the past 240 hour(s))  SARS Coronavirus 2 by RT PCR (hospital order, performed in [facility] hospital lab) Nasopharyngeal Nasopharyngeal Swab     Status: None   Collection Time: 11/04/19  9:17 PM   Specimen: Nasopharyngeal Swab  Result Value Ref Range Status   SARS Coronavirus 2 NEGATIVE NEGATIVE Final    Comment: (NOTE) SARS-CoV-2 target nucleic acids are NOT DETECTED.  The SARS-CoV-2 RNA is generally detectable in upper and lower respiratory specimens during the acute phase of infection. The lowest concentration of SARS-CoV-2 viral copies this assay can detect is 250 copies / mL. A negative result does not preclude SARS-CoV-2 infection and should not be used as the sole basis for treatment or other patient management decisions.  A negative result may occur with improper specimen collection / handling, submission of specimen other than nasopharyngeal swab, presence of viral mutation(s) within the areas targeted by this assay, and inadequate number of viral copies (<250 copies / mL). A negative result must be combined with clinical observations, patient history, and epidemiological information.  Fact Sheet for Patients:   StrictlyIdeas.no  Fact Sheet for Healthcare Providers: BankingDealers.co.za  This test is not yet approved or  cleared by the [place] FDA and has  been authorized for detection and/or diagnosis of SARS-CoV-2 by FDA under an Emergency Use Authorization (EUA).  This EUA will remain in effect (meaning this test can be used) for the duration of the COVID-19 declaration under Section 564(b)(1) of the Act, 21 U.S.C. section 360bbb-3(b)(1), unless the authorization is terminated or revoked sooner.  Performed at [facility], [place] [address], [place], [place] [address]      Radiological Exams on Admission: US Renal  Result Date: 11/04/2019 CLINICAL DATA:  Acute kidney injury EXAM: RENAL / URINARY TRACT ULTRASOUND COMPLETE COMPARISON:  None. FINDINGS: Right Kidney: Renal measurements: 9.8 x 5.8 x 4 6 cm = volume: 137 mL . Echogenicity within normal limits. Mild pelvic fullness is seen. No shadowing stones are noted. Left Kidney: Renal measurements: 9.7 x 6.5 x 9.7 cm = volume: 137 mL. Echogenicity within normal limits. Anechoic cyst seen within the lower pole measuring 2.5 x 2 5 x 2.6 cm. Mild pelvic fullness is seen. Bladder: Dilated fluid-filled bladder is noted. The prevoid volume is 1518 mL. The patient is unable to void Other: None. IMPRESSION: Mild bilateral pelvic fullness with a dilated fluid-filled bladder. No shadowing stones. Patient is unable to void. This could be due to neurogenic bladder or bladder outlet obstruction. Electronically Signed   By: Prudencio Pair M.D.   On: 11/04/2019 20:35   DG Chest Port 1 View  Result Date: 11/04/2019 CLINICAL DATA:  Question sepsis EXAM: PORTABLE CHEST 1 VIEW COMPARISON:  08/13/2018 FINDINGS: Cardiomegaly. Mild vascular congestion. Left lower lobe airspace opacity with small left effusion. No confluent opacity on the right. No acute bony abnormality. IMPRESSION: Small left pleural effusion with left lower lobe airspace opacity concerning for pneumonia. Cardiomegaly, vascular congestion. Electronically Signed   By: Rolm Baptise M.D.   On: 11/04/2019 18:10    EKG: Independently reviewed.  A. fib  with RVR.  Assessment/Plan Principal Problem:   Sepsis ([place]) Active Problems:   Atrial fibrillation (H[facility])   Chronic kidney disease, stage IV (severe) (H[facility])   Diabetes mellitus with renal complications ([place])   Long term current use of anticoagulant therapy  Chronic diastolic heart failure (H[facility])   Anemia due to blood loss, chronic   AKI (acute kidney injury) ([place])    1. Possible developing sepsis source could be left lower extremity cellulitis for which patient is on empiric antibiotics follow cultures gently hydrate follow lactic acid procalcitonin. 2. Acute on chronic disease stage IV with hyperkalemia could be from poor appetite and also patient was found to have obstructive uropathy for which in and out cath was done.  Repeat bladder scan if there is still urinary retention may need Foley catheter.  For which patient is placed on IV fluids patient received IV bicarbonate Lokelma IV calcium gluconate insulin and D50.  Follow metabolic panel.  Renal ultrasound shows obstruction. 3. Patient has abdominal hernia which as per the daughter has increased in size for which I have ordered CT abdomen. 4. Chronic diastolic CHF presently receiving fluids due to dehydration. 5. A. fib with RVR started on Cardizem infusion.  Patient is coagulopathic with INR of 8.  Presently Coumadin on hold.  No active bleed. 6. Diabetes mellitus type 2 we will keep patient on sliding scale coverage. 7. Failure to thrive with the severe protein calorie malnutrition -discussed with patient's daughter in detail.  At this time we will be continuing medications for the possible sepsis and hydrating.  If patient's general condition does not improve in the next 24 hours patient will be made a comfort measure. 8. Left lower extremity swelling likely from cellulitis.  Will check ABI and Dopplers.  Since patient has septic picture with worsening renal function with hyperkalemia with coagulopathy will need inpatient status for  any further deterioration.   DVT prophylaxis: Coagulopathic.  On Coumadin. Code Status: DNR confirmed with patient's daughter. Family Communication: Patient's daughter. Disposition Plan: To be determined. Consults called: Palliative care. Admission status: Inpatient.   Rise Patience MD [facility] Pager [phone].  If 7PM-7AM, please contact night-coverage [email or web address] Password [facility]  11/04/2019, 11:00 PM

## 2019-11-04 NOTE — ED Provider Notes (Signed)
[facility] EMERGENCY DEPARTMENT Provider Note   CSN: [id number] Arrival date & time: 11/04/19  1646     History Chief Complaint  Patient presents with  . Altered Mental Status    [name] is a 86 y.o. female.  Patient with general weakness, arrives by EMS. Per report, home health RN ?hospice RN was concerned that pt generally did not look well, and EMS was called. Patient denies specific physical c/o but does say she just doesn't feel well, generally weak. Symptoms gradual onset in past week, constant, severe, slowly worse. No report of fever. No trauma or fall. No syncope. +recent poor po intake. No headache. No chest pain or sob. No abd pain or vomiting/diarrhea. Denies gu c/o. No cough. No known covid exposure. Report of odor to urine.   The history is provided by the patient and the EMS personnel.  Altered Mental Status Associated symptoms: weakness   Associated symptoms: no abdominal pain, no agitation, no fever, no headaches, no rash and no vomiting        Past Medical History:  Diagnosis Date  . Anemia due to blood loss, chronic 08/23/2013  . Atrial fibrillation ([place]) 12/09/2012  . Candidal skin infection 12/23/2012  . Chronic diastolic heart failure ([place]) 08/23/2013  . Chronic kidney disease, stage IV (severe) ([place]) 12/09/2012  . Diabetes mellitus with renal complications ([place]) 62/08/9483  . Diabetes mellitus without complication ([place])   . Dyslipidemia 12/09/2012  . Fall at home Sept. 3, 2016   Fx  3 ribs  . GERD (gastroesophageal reflux disease) 12/23/2012  . HTN (hypertension) 12/09/2012  . Hyperlipidemia 12/23/2012  . Hypertension   . Ileus, postoperative ([place]) 12/17/2012  . Intractable pain 12/03/2014  . Irregular heart beat   . Long term current use of anticoagulant therapy 02/22/2013  . On amiodarone therapy 02/22/2013  . Peptic esophagitis 08/23/2013  . Pressure injury of skin 05/21/2016  . Renal disorder   . Type II diabetes mellitus,  uncontrolled ([place]) 09/06/2013  . Umbilical hernia, incarcerated - reducible 12/10/2012    Patient Active Problem List   Diagnosis Date Noted  . Pressure injury of skin 05/21/2016  . Cellulitis of great toe of right foot 05/15/2016  . Intractable pain 12/03/2014  . Type II diabetes mellitus, uncontrolled ([place]) 09/06/2013  . Chronic diastolic heart failure ([place]) 08/23/2013  . Anemia due to blood loss, chronic 08/23/2013  . Peptic esophagitis 08/23/2013  . Encounter for therapeutic drug monitoring 04/29/2013  . On amiodarone therapy 02/22/2013  . Long term current use of anticoagulant therapy 02/22/2013  . Candidal skin infection 12/23/2012  . Hyperlipidemia 12/23/2012  . GERD (gastroesophageal reflux disease) 12/23/2012  . Diabetes mellitus with renal complications ([place]) 46/27/0350  . Umbilical hernia, incarcerated - reducible 12/10/2012  . HTN (hypertension) 12/09/2012  . Dyslipidemia 12/09/2012  . Atrial fibrillation ([place]) 12/09/2012  . Chronic kidney disease, stage IV (severe) ([place]) 12/09/2012    Past Surgical History:  Procedure Laterality Date  . BREAST LUMPECTOMY Right 2001   2 lumpectomies with radiation both 2001  . CATARACT EXTRACTION, BILATERAL    . HERNIA REPAIR    . I & D EXTREMITY Right 05/19/2016   Procedure: MINOR IRRIGATION AND DEBRIDEMENT Right great toe;  Surgeon: Gaynelle Arabian, MD;  Location: [facility];  Service: Orthopedics;  Laterality: Right;  . INSERTION OF MESH N/A 12/12/2012   Procedure: INSERTION OF MESH;  Surgeon: Madilyn Hook, DO;  Location: [facility];  Service: General;  Laterality: N/A;  .  LAPAROSCOPIC LYSIS OF ADHESIONS N/A 12/12/2012   Procedure: LAPAROSCOPIC LYSIS OF ADHESIONS;  Surgeon: Madilyn Hook, DO;  Location: [facility];  Service: General;  Laterality: N/A;  . Right Lumpectomy    . VENTRAL HERNIA REPAIR N/A 12/12/2012   Procedure: LAPAROSCOPIC VENTRAL HERNIA;  Surgeon: Madilyn Hook, DO;  Location: [facility];  Service: General;  Laterality: N/A;     OB  History   No obstetric history on file.     Family History  Problem Relation Age of Onset  . Diabetes Mother   . Diabetes Sister     Social History   Tobacco Use  . Smoking status: Former Smoker    Types: Cigarettes  . Smokeless tobacco: Never Used  Vaping Use  . Vaping Use: Never used  Substance Use Topics  . Alcohol use: No  . Drug use: No    Home Medications Prior to Admission medications   Medication Sig Start Date End Date Taking? Authorizing Provider  acidophilus (RISAQUAD) CAPS capsule Take 1 capsule by mouth daily as needed (GI issues).     [provider]  atorvastatin (LIPITOR) 40 MG tablet Take 40 mg by mouth every evening.     [provider]  B-D UF III MINI PEN NEEDLES 31G X 5 MM MISC USE 3 PEN NEEDLES PER DAY 06/10/16   Elayne Snare, MD  Cranberry 250 MG CAPS Take 750 mg by mouth daily.     [provider]  diltiazem (CARDIZEM CD) 180 MG 24 hr capsule TAKE 1 CAPSULE BY MOUTH EVERY DAY 04/05/19   Belva Crome, MD  econazole nitrate 1 % cream Apply 1 application topically daily as needed (to groin rash).     [provider]  esomeprazole (NEXIUM) 20 MG capsule Take 20 mg by mouth daily before breakfast.     [provider]  ferrous sulfate 325 (65 FE) MG tablet Take 325 mg by mouth daily with breakfast.     [provider]  fluticasone (FLONASE) 50 MCG/ACT nasal spray Place 2 sprays into both nostrils daily as needed for rhinitis.     [provider]  furosemide (LASIX) 80 MG tablet Take 1.5 tablets (120 mg total) by mouth daily. 10/21/19   Belva Crome, MD  metolazone (ZAROXOLYN) 2.5 MG tablet TAKE 1 TABLET (2.5 MG TOTAL) BY MOUTH ONCE A WEEK. 07/22/19   Belva Crome, MD  metoprolol tartrate (LOPRESSOR) 100 MG tablet TAKE 1 TABLET BY MOUTH TWICE A DAY 10/04/19   Belva Crome, MD  [facility] [place] VERIO test strip USE 1 STRIP TO CHECK GLUCOSE TWICE DAILY AS DIRECTED 02/03/19   Elayne Snare, MD  polyethylene  glycol (MIRALAX / GLYCOLAX) packet Take 17 g by mouth daily as needed for mild constipation.     [provider]  potassium chloride (KLOR-CON) 10 MEQ tablet TAKE 3 TABLETS BY MOUTH EVERY MORNING AND TAKE 3 TABLETS BY MOUTH EVERY EVENING 02/08/19   Belva Crome, MD  repaglinide (PRANDIN) 0.5 MG tablet 1 tab before breakfast and 2 before supper 09/13/19   Elayne Snare, MD  warfarin (COUMADIN) 5 MG tablet TAKE AS DIRECTED BY COUMADIN CLINIC 03/09/19   Belva Crome, MD    Allergies    Lotensin [benazepril hcl], Zantac [ranitidine hcl], and Tape  Review of Systems   Review of Systems  Constitutional: Negative for chills and fever.  HENT: Negative for sore throat.   Eyes: Negative for redness.  Respiratory: Negative for cough and shortness of  breath.   Cardiovascular: Negative for chest pain.  Gastrointestinal: Negative for abdominal pain and vomiting.  Endocrine: Negative for polyuria.  Genitourinary: Negative for dysuria and flank pain.  Musculoskeletal: Negative for back pain and neck pain.  Skin: Negative for rash.  Neurological: Positive for weakness. Negative for headaches.  Hematological: Does not bruise/bleed easily.  Psychiatric/Behavioral: Negative for agitation.    Physical Exam Updated Vital Signs BP 123/88 (BP Location: Left Arm)   Pulse (!) 114   Temp 98.1 F (36.7 C) (Axillary)   Resp (!) 27   SpO2 98%   Physical Exam Vitals and nursing note reviewed.  Constitutional:      Appearance: She is well-developed. She is ill-appearing.  HENT:     Head: Atraumatic.     Nose: Nose normal.     Mouth/Throat:     Mouth: Mucous membranes are moist.  Eyes:     General: No scleral icterus.    Conjunctiva/sclera: Conjunctivae normal.     Pupils: Pupils are equal, round, and reactive to light.  Neck:     Vascular: No carotid bruit.     Trachea: No tracheal deviation.     Comments: No stiffness or rigidity.  Cardiovascular:     Rate and Rhythm: Tachycardia  present. Rhythm irregular.     Pulses: Normal pulses.     Heart sounds: Normal heart sounds. No murmur heard.  No friction rub. No gallop.   Pulmonary:     Effort: Pulmonary effort is normal. No respiratory distress.     Breath sounds: Normal breath sounds.  Abdominal:     General: Bowel sounds are normal. There is distension.     Palpations: Abdomen is soft.     Tenderness: There is no abdominal tenderness. There is no guarding.  Genitourinary:    Comments: No cva tenderness.  Musculoskeletal:     Cervical back: Normal range of motion and neck supple. No rigidity. No muscular tenderness.     Comments: Skin tear to LLE, superficial. Erythema and tenderness to skin of LLE. No crepitus. Distal pulse palp.   Lymphadenopathy:     Cervical: No cervical adenopathy.  Skin:    General: Skin is warm and dry.     Findings: No rash.  Neurological:     Mental Status: She is alert.     Comments: Alert, speech very quiet, but not grossly dysarthric. Moves bil extremities purposefully.   Psychiatric:        Mood and Affect: Mood normal.      ED Results / Procedures / Treatments   Labs (all labs ordered are listed, but only abnormal results are displayed) Results for orders placed or performed during the hospital encounter of 11/04/19  Lactic acid, plasma  Result Value Ref Range   Lactic Acid, Venous 2.8 (HH) 0.5 - 1.9 mmol/L  Comprehensive metabolic panel  Result Value Ref Range   Sodium 135 135 - 145 mmol/L   Potassium 7.5 (HH) 3.5 - 5.1 mmol/L   Chloride 103 98 - 111 mmol/L   CO2 18 (L) 22 - 32 mmol/L   Glucose, Bld 333 (H) 70 - 99 mg/dL   BUN 143 (H) 8 - 23 mg/dL   Creatinine, Ser 3.36 (H) 0.44 - 1.00 mg/dL   Calcium 9.3 8.9 - 10.3 mg/dL   Total Protein 6.8 6.5 - 8.1 g/dL   Albumin 2.6 (L) 3.5 - 5.0 g/dL   AST 25 15 - 41 U/L   ALT 18 0 - 44 U/L  Alkaline Phosphatase 52 38 - 126 U/L   Total Bilirubin 1.2 0.3 - 1.2 mg/dL   GFR calc non Af Amer 12 (L) >60 mL/min   GFR calc Af  Amer 14 (L) >60 mL/min   Anion gap 14 5 - 15  CBC WITH DIFFERENTIAL  Result Value Ref Range   WBC 17.1 (H) 4.0 - 10.5 K/uL   RBC 4.99 3.87 - 5.11 MIL/uL   Hemoglobin 13.6 12.0 - 15.0 g/dL   HCT 43.5 36 - 46 %   MCV 87.2 80.0 - 100.0 fL   MCH 27.3 26.0 - 34.0 pg   MCHC 31.3 30.0 - 36.0 g/dL   RDW 15.3 11.5 - 15.5 %   Platelets 334 150 - 400 K/uL   nRBC 0.0 0.0 - 0.2 %   Neutrophils Relative % 83 %   Neutro Abs 14.3 (H) 1.7 - 7.7 K/uL   Lymphocytes Relative 7 %   Lymphs Abs 1.1 0.7 - 4.0 K/uL   Monocytes Relative 9 %   Monocytes Absolute 1.5 (H) 0 - 1 K/uL   Eosinophils Relative 0 %   Eosinophils Absolute 0.0 0 - 0 K/uL   Basophils Relative 0 %   Basophils Absolute 0.0 0 - 0 K/uL   Immature Granulocytes 1 %   Abs Immature Granulocytes 0.15 (H) 0.00 - 0.07 K/uL  Protime-INR  Result Value Ref Range   Prothrombin Time 67.1 (H) 11.4 - 15.2 seconds   INR 8.3 (HH) 0.8 - 1.2   DG Chest Port 1 View  Result Date: 11/04/2019 CLINICAL DATA:  Question sepsis EXAM: PORTABLE CHEST 1 VIEW COMPARISON:  08/13/2018 FINDINGS: Cardiomegaly. Mild vascular congestion. Left lower lobe airspace opacity with small left effusion. No confluent opacity on the right. No acute bony abnormality. IMPRESSION: Small left pleural effusion with left lower lobe airspace opacity concerning for pneumonia. Cardiomegaly, vascular congestion. Electronically Signed   By: Rolm Baptise M.D.   On: 11/04/2019 18:10    EKG EKG Interpretation  Date/Time:  Friday November 04 2019 17:02:07 EDT Ventricular Rate:  147 PR Interval:    QRS Duration: 100 QT Interval:  306 QTC Calculation: 485 R Axis:   -83 Text Interpretation: Atrial fibrillation with rapid V-rate Left anterior fascicular block LVH with secondary repolarization abnormality Non-specific ST-t changes Confirmed by Lajean Saver [id number]) on 11/04/2019 5:13:11 PM   Radiology DG Chest Port 1 View  Result Date: 11/04/2019 CLINICAL DATA:  Question sepsis EXAM:  PORTABLE CHEST 1 VIEW COMPARISON:  08/13/2018 FINDINGS: Cardiomegaly. Mild vascular congestion. Left lower lobe airspace opacity with small left effusion. No confluent opacity on the right. No acute bony abnormality. IMPRESSION: Small left pleural effusion with left lower lobe airspace opacity concerning for pneumonia. Cardiomegaly, vascular congestion. Electronically Signed   By: Rolm Baptise M.D.   On: 11/04/2019 18:10    Procedures Procedures (including critical care time)  Medications Ordered in ED Medications  diltiazem (CARDIZEM) 1 mg/mL load via infusion 10 mg (10 mg Intravenous Bolus from Bag 11/04/19 1815)    And  diltiazem (CARDIZEM) 125 mg in dextrose 5% 125 mL (1 mg/mL) infusion (7.5 mg/hr Intravenous Rate/Dose Change 11/04/19 1911)  vancomycin (VANCOREADY) IVPB 750 mg/150 mL (750 mg Intravenous New Bag/Given 11/04/19 1814)  calcium chloride injection 1 g (has no administration in time range)  sodium bicarbonate injection 50 mEq (has no administration in time range)  dextrose 50 % solution 25 g (has no administration in time range)  insulin aspart (novoLOG) injection 6 Units (has no  administration in time range)  sodium chloride 0.9 % bolus 1,000 mL (has no administration in time range)  piperacillin-tazobactam (ZOSYN) IVPB 3.375 g (0 g Intravenous Stopped 11/04/19 1808)  sodium chloride 0.9 % bolus 500 mL (0 mLs Intravenous Stopped 11/04/19 1910)    ED Course  I have reviewed the triage vital signs and the nursing notes.  Pertinent labs & imaging results that were available during my care of the patient were reviewed by me and considered in my medical decision making (see chart for details).    MDM Rules/Calculators/A&P                         Iv ns. Stat labs. Cultures. Iv abx.   Reviewed nursing notes and prior charts for additional history.   MDM Number of Diagnoses or Management Options   Amount and/or Complexity of Data Reviewed Clinical lab tests: ordered and  reviewed Tests in the radiology section of CPT: ordered and reviewed Tests in the medicine section of CPT: ordered and reviewed Discussion of test results with the performing providers: yes Decide to obtain previous medical records or to obtain history from someone other than the patient: yes Obtain history from someone other than the patient: yes Review and summarize past medical records: yes Discuss the patient with other providers: yes Independent visualization of images, tracings, or specimens: yes  Risk of Complications, Morbidity, and/or Mortality Presenting problems: high Diagnostic procedures: high Management options: high   initial labs reviewed/interpreted by me - lactate high. Iv ns bolus.  k very high. Cal chloride iv, hco3 iv. d50 iv, insulin iv. Iv ns bolus.   CXR reviewed/interpreted by me - pna.   Patient with rapid afib, hx same. cardizem bolus/gtt.  Additional labs reviewed/interpreted by me - inr very high, on coumadin. Hold coumadin. No current bleeding.   Bladder scan. Renal u/s. Pt with acute on chronic renal failure w markedly elevated bun.  Discussed with hospitalist, Dr Hal Hope - he will see/admit, he request lokelma also be given.   CRITICAL CARE RE: acute on chronic renal failure with severe uremia and hyperkalemia, coagulopathy, cellulitis, r/o pna.  Performed by: Mirna Mires Total critical care time: 125 minutes Critical care time was exclusive of separately billable procedures and treating other patients. Critical care was necessary to treat or prevent imminent or life-threatening deterioration. Critical care was time spent personally by me on the following activities: development of treatment plan with patient and/or surrogate as well as nursing, discussions with consultants, evaluation of patient's response to treatment, examination of patient, obtaining history from patient or surrogate, ordering and performing treatments and interventions,  ordering and review of laboratory studies, ordering and review of radiographic studies, pulse oximetry and re-evaluation of patient's condition.   Final Clinical Impression(s) / ED Diagnoses Final diagnoses:  None    Rx / DC Orders ED Discharge Orders    None       Lajean Saver, MD 11/04/19 1956

## 2019-11-04 NOTE — ED Notes (Signed)
Bladder scan >930 ml

## 2019-11-05 ENCOUNTER — Encounter (HOSPITAL_COMMUNITY): Payer: Medicare Other

## 2019-11-05 ENCOUNTER — Inpatient Hospital Stay (HOSPITAL_COMMUNITY): Payer: Medicare Other

## 2019-11-05 DIAGNOSIS — I4821 Permanent atrial fibrillation: Secondary | ICD-10-CM

## 2019-11-05 DIAGNOSIS — Z515 Encounter for palliative care: Secondary | ICD-10-CM

## 2019-11-05 DIAGNOSIS — Z7189 Other specified counseling: Secondary | ICD-10-CM

## 2019-11-05 DIAGNOSIS — Z66 Do not resuscitate: Secondary | ICD-10-CM

## 2019-11-05 DIAGNOSIS — G8929 Other chronic pain: Secondary | ICD-10-CM

## 2019-11-05 LAB — CBC WITH DIFFERENTIAL/PLATELET
Abs Immature Granulocytes: 0.16 10*3/uL — ABNORMAL HIGH (ref 0.00–0.07)
Basophils Absolute: 0 10*3/uL (ref 0.0–0.1)
Basophils Relative: 0 %
Eosinophils Absolute: 0 10*3/uL (ref 0.0–0.5)
Eosinophils Relative: 0 %
HCT: 42.9 % (ref 36.0–46.0)
Hemoglobin: 12.8 g/dL (ref 12.0–15.0)
Immature Granulocytes: 1 %
Lymphocytes Relative: 5 %
Lymphs Abs: 1 10*3/uL (ref 0.7–4.0)
MCH: 27.4 pg (ref 26.0–34.0)
MCHC: 29.8 g/dL — ABNORMAL LOW (ref 30.0–36.0)
MCV: 91.7 fL (ref 80.0–100.0)
Monocytes Absolute: 2 10*3/uL — ABNORMAL HIGH (ref 0.1–1.0)
Monocytes Relative: 9 %
Neutro Abs: 18 10*3/uL — ABNORMAL HIGH (ref 1.7–7.7)
Neutrophils Relative %: 85 %
Platelets: 269 10*3/uL (ref 150–400)
RBC: 4.68 MIL/uL (ref 3.87–5.11)
RDW: 15.6 % — ABNORMAL HIGH (ref 11.5–15.5)
WBC: 21.2 10*3/uL — ABNORMAL HIGH (ref 4.0–10.5)
nRBC: 0 % (ref 0.0–0.2)

## 2019-11-05 LAB — BASIC METABOLIC PANEL
Anion gap: 14 (ref 5–15)
BUN: 140 mg/dL — ABNORMAL HIGH (ref 8–23)
CO2: 14 mmol/L — ABNORMAL LOW (ref 22–32)
Calcium: 9.3 mg/dL (ref 8.9–10.3)
Chloride: 108 mmol/L (ref 98–111)
Creatinine, Ser: 3.07 mg/dL — ABNORMAL HIGH (ref 0.44–1.00)
GFR calc Af Amer: 15 mL/min — ABNORMAL LOW (ref >60)
GFR calc non Af Amer: 13 mL/min — ABNORMAL LOW (ref >60)
Glucose, Bld: 321 mg/dL — ABNORMAL HIGH (ref 70–99)
Potassium: 6.4 mmol/L (ref 3.5–5.1)
Sodium: 136 mmol/L (ref 135–145)

## 2019-11-05 LAB — HEPATIC FUNCTION PANEL
ALT: 14 U/L (ref 0–44)
AST: 29 U/L (ref 15–41)
Albumin: 2.1 g/dL — ABNORMAL LOW (ref 3.5–5.0)
Alkaline Phosphatase: 47 U/L (ref 38–126)
Bilirubin, Direct: 0.4 mg/dL — ABNORMAL HIGH (ref 0.0–0.2)
Indirect Bilirubin: 0.9 mg/dL (ref 0.3–0.9)
Total Bilirubin: 1.3 mg/dL — ABNORMAL HIGH (ref 0.3–1.2)
Total Protein: 5.8 g/dL — ABNORMAL LOW (ref 6.5–8.1)

## 2019-11-05 LAB — GLUCOSE, CAPILLARY
Glucose-Capillary: 101 mg/dL — ABNORMAL HIGH (ref 70–99)
Glucose-Capillary: 123 mg/dL — ABNORMAL HIGH (ref 70–99)
Glucose-Capillary: 191 mg/dL — ABNORMAL HIGH (ref 70–99)
Glucose-Capillary: 218 mg/dL — ABNORMAL HIGH (ref 70–99)
Glucose-Capillary: 279 mg/dL — ABNORMAL HIGH (ref 70–99)
Glucose-Capillary: 287 mg/dL — ABNORMAL HIGH (ref 70–99)
Glucose-Capillary: 79 mg/dL (ref 70–99)

## 2019-11-05 LAB — POTASSIUM
Potassium: 5.8 mmol/L — ABNORMAL HIGH (ref 3.5–5.1)
Potassium: 5.3 mmol/L — ABNORMAL HIGH (ref 3.5–5.1)
Potassium: 4.3 mmol/L (ref 3.5–5.1)

## 2019-11-05 LAB — PROCALCITONIN: Procalcitonin: 1.1 ng/mL

## 2019-11-05 LAB — PROTIME-INR
INR: >10 (ref 0.8–1.2)
Prothrombin Time: 84.1 seconds — ABNORMAL HIGH (ref 11.4–15.2)

## 2019-11-05 LAB — LACTIC ACID, PLASMA: Lactic Acid, Venous: 4.2 mmol/L (ref 0.5–1.9)

## 2019-11-05 MED ORDER — VANCOMYCIN HCL IN DEXTROSE 1-5 GM/200ML-% IV SOLN: 1000.0000 mg | Freq: Once | INTRAVENOUS | Status: DC

## 2019-11-05 MED ORDER — SODIUM CHLORIDE 0.9 % IV SOLN
2.0000 g | INTRAVENOUS | Status: DC
  Administered 2019-11-05 – 2019-11-07 (×3): 2 g via INTRAVENOUS
  Filled 2019-11-05: qty 2
  Filled 2019-11-05: qty 20
  Filled 2019-11-05 (×2): qty 2

## 2019-11-05 MED ORDER — INSULIN ASPART 100 UNIT/ML IV SOLN
5.0000 [IU] | Freq: Once | INTRAVENOUS | Status: AC
  Administered 2019-11-05: 5 [IU] via INTRAVENOUS

## 2019-11-05 MED ORDER — DILTIAZEM HCL ER COATED BEADS 180 MG PO CP24: 180.0000 mg | ORAL_CAPSULE | Freq: Every day | ORAL | Status: DC

## 2019-11-05 MED ORDER — SODIUM CHLORIDE 0.9 % IV SOLN
2.0000 g | INTRAVENOUS | Status: DC
  Filled 2019-11-05: qty 2

## 2019-11-05 MED ORDER — PHYTONADIONE 5 MG PO TABS
5.0000 mg | ORAL_TABLET | ORAL | Status: AC
  Administered 2019-11-05: 5 mg via ORAL
  Filled 2019-11-05: qty 1

## 2019-11-05 MED ORDER — DILTIAZEM HCL 30 MG PO TABS
30.0000 mg | ORAL_TABLET | Freq: Four times a day (QID) | ORAL | Status: DC
  Filled 2019-11-05: qty 1

## 2019-11-05 MED ORDER — TRAMADOL HCL 50 MG PO TABS
50.0000 mg | ORAL_TABLET | Freq: Four times a day (QID) | ORAL | Status: DC | PRN
  Administered 2019-11-06: 50 mg via ORAL
  Filled 2019-11-05: qty 1

## 2019-11-05 MED ORDER — SIMETHICONE 80 MG PO CHEW
40.0000 mg | CHEWABLE_TABLET | Freq: Four times a day (QID) | ORAL | Status: DC | PRN
  Administered 2019-11-05 – 2019-11-06 (×2): 40 mg via ORAL
  Filled 2019-11-05 (×2): qty 1

## 2019-11-05 MED ORDER — SODIUM ZIRCONIUM CYCLOSILICATE 10 G PO PACK
10.0000 g | PACK | Freq: Once | ORAL | Status: AC
  Administered 2019-11-05: 10 g via ORAL
  Filled 2019-11-05: qty 1

## 2019-11-05 MED ORDER — INSULIN ASPART 100 UNIT/ML ~~LOC~~ SOLN
0.0000 [IU] | Freq: Three times a day (TID) | SUBCUTANEOUS | Status: DC
  Administered 2019-11-06: 3 [IU] via SUBCUTANEOUS
  Administered 2019-11-06: 5 [IU] via SUBCUTANEOUS
  Administered 2019-11-06: 9 [IU] via SUBCUTANEOUS
  Administered 2019-11-07: 3 [IU] via SUBCUTANEOUS
  Administered 2019-11-07 (×2): 5 [IU] via SUBCUTANEOUS
  Administered 2019-11-08: 9 [IU] via SUBCUTANEOUS
  Administered 2019-11-08: 5 [IU] via SUBCUTANEOUS
  Administered 2019-11-08: 9 [IU] via SUBCUTANEOUS

## 2019-11-05 MED ORDER — POLYETHYLENE GLYCOL 3350 17 G PO PACK
17.0000 g | PACK | Freq: Every day | ORAL | Status: DC | PRN
  Administered 2019-11-06: 17 g via ORAL
  Filled 2019-11-05: qty 1

## 2019-11-05 MED ORDER — METRONIDAZOLE IN NACL 5-0.79 MG/ML-% IV SOLN
500.0000 mg | Freq: Three times a day (TID) | INTRAVENOUS | Status: DC
  Administered 2019-11-05: 500 mg via INTRAVENOUS
  Filled 2019-11-05: qty 100

## 2019-11-05 MED ORDER — SODIUM CHLORIDE 0.9 % IV SOLN: 1.0000 g | INTRAVENOUS | Status: DC

## 2019-11-05 MED ORDER — ACETAMINOPHEN 500 MG PO TABS
1000.0000 mg | ORAL_TABLET | Freq: Three times a day (TID) | ORAL | Status: DC
  Administered 2019-11-05 – 2019-11-10 (×17): 1000 mg via ORAL
  Filled 2019-11-05 (×17): qty 2

## 2019-11-05 MED ORDER — SODIUM BICARBONATE 8.4 % IV SOLN
50.0000 meq | Freq: Once | INTRAVENOUS | Status: AC
  Administered 2019-11-05: 50 meq via INTRAVENOUS
  Filled 2019-11-05: qty 50

## 2019-11-05 NOTE — Progress Notes (Signed)
PHARMACY NOTE:  ANTIMICROBIAL RENAL DOSAGE ADJUSTMENT  Current antimicrobial regimen includes a mismatch between antimicrobial dosage and estimated renal function.  As per policy approved by the Pharmacy & Therapeutics and Medical Executive Committees, the antimicrobial dosage will be adjusted accordingly.  Current antimicrobial dosage:  Cefepime 2GM Q24H  Indication: cellulitis  Renal Function: Scr 3.07 (BL 1.7-2)  Estimated Creatinine Clearance: 12.8 mL/min (A) (by C-G formula based on SCr of 3.07 mg/dL (H)). []      On intermittent HD, scheduled: []      On CRRT    Antimicrobial dosage has been changed to:  Cefepime 1GM Q24H  Additional comments: Will follow up with physician in regards to narrowing antibiotic therapy.    Cephus Slater, PharmD, MBA Pharmacy Resident [phone] 11/05/2019 7:58 AM

## 2019-11-05 NOTE — Progress Notes (Signed)
ANTICOAGULATION CONSULT NOTE - Initial Consult  Pharmacy Consult for Warfarin  Indication: atrial fibrillation  Allergies  Allergen Reactions  . Lotensin [Benazepril Hcl] Anaphylaxis  . Zantac [Ranitidine Hcl] Anaphylaxis  . Tape Itching and Rash    Please use paper tape     Vital Signs: Temp: 98 F (36.7 C) (08/13 2337) Temp Source: Oral (08/13 2337) BP: 137/80 (08/13 2337) Pulse Rate: 106 (08/13 2337)  Labs: Recent Labs    11/03/19 0000 11/04/19 1720 11/05/19 0231  HGB 13.0 13.6 12.8  HCT 43.1 43.5 42.9  PLT 323 334 269  LABPROT 48.3* 67.1*  --   INR 4.9* 8.3*  --   CREATININE 2.91* 3.36* 3.07*    Estimated Creatinine Clearance: 12.8 mL/min (A) (by C-G formula based on SCr of 3.07 mg/dL (H)).   Medical History: Past Medical History:  Diagnosis Date  . Anemia due to blood loss, chronic 08/23/2013  . Atrial fibrillation ([place]) 12/09/2012  . Candidal skin infection 12/23/2012  . Chronic diastolic heart failure ([place]) 08/23/2013  . Chronic kidney disease, stage IV (severe) ([place]) 12/09/2012  . Diabetes mellitus with renal complications ([place]) 45/06/979  . Diabetes mellitus without complication ([place])   . Dyslipidemia 12/09/2012  . Fall at home Sept. 3, 2016   Fx  3 ribs  . GERD (gastroesophageal reflux disease) 12/23/2012  . HTN (hypertension) 12/09/2012  . Hyperlipidemia 12/23/2012  . Hypertension   . Ileus, postoperative ([place]) 12/17/2012  . Intractable pain 12/03/2014  . Irregular heart beat   . Long term current use of anticoagulant therapy 02/22/2013  . On amiodarone therapy 02/22/2013  . Peptic esophagitis 08/23/2013  . Pressure injury of skin 05/21/2016  . Renal disorder   . Type II diabetes mellitus, uncontrolled ([place]) 09/06/2013  . Umbilical hernia, incarcerated - reducible 12/10/2012    Assessment: 86 y/o with weakness, ?sepsis. On warfarin PTA for afib. INR is elevated at 8.3. No active bleeding. Hgb good at 12.8  Goal of Therapy:  INR 2-3 Monitor platelets by  anticoagulation protocol: Yes   Plan:  Daily PT/INR Re-start warfarin as INR allows Monitor for bleeding  Narda Bonds, PharmD, BCPS Clinical Pharmacist Phone: [phone]

## 2019-11-05 NOTE — Progress Notes (Signed)
Manufacturing engineer [facility])  [name] was in the process of being admitted to hospice services when she became acutely ill and family requested that she be evaluated at the hospital.  Once pt is medically optimized and d/c'd back home, [facility] will continue to support and begin hospice services.  Venia Carbon RN, BSN, [facility] Liaison

## 2019-11-05 NOTE — Progress Notes (Signed)
PHARMACY - PHYSICIAN COMMUNICATION CRITICAL VALUE ALERT - BLOOD CULTURE IDENTIFICATION (BCID)  [name] is an 86 y.o. female  Assessment: proteus on bcid in blood  Name of physician (or Provider) Contacted: [place]  Current antibiotics: Cefepime Vanc  Changes to prescribed antibiotics recommended:  DC Cefepime Vanc Ceftriaxone 2 g q24h  Results for orders placed or performed during the hospital encounter of 11/04/19  Blood Culture ID Panel (Reflexed) (Collected: 11/04/2019  5:34 PM)  Result Value Ref Range   Enterococcus faecalis NOT DETECTED NOT DETECTED   Enterococcus Faecium NOT DETECTED NOT DETECTED   Listeria monocytogenes PENDING NOT DETECTED   Staphylococcus species NOT DETECTED NOT DETECTED   Staphylococcus aureus (BCID) NOT DETECTED NOT DETECTED   Staphylococcus epidermidis NOT DETECTED NOT DETECTED   Staphylococcus lugdunensis NOT DETECTED NOT DETECTED   Streptococcus species NOT DETECTED NOT DETECTED   Streptococcus agalactiae NOT DETECTED NOT DETECTED   Streptococcus pneumoniae NOT DETECTED NOT DETECTED   Streptococcus pyogenes NOT DETECTED NOT DETECTED   A.calcoaceticus-baumannii NOT DETECTED NOT DETECTED   Bacteroides fragilis NOT DETECTED NOT DETECTED   Enterobacterales DETECTED (A) NOT DETECTED   Enterobacter cloacae complex NOT DETECTED NOT DETECTED   Escherichia coli NOT DETECTED NOT DETECTED   Klebsiella aerogenes NOT DETECTED NOT DETECTED   Klebsiella oxytoca NOT DETECTED NOT DETECTED   Klebsiella pneumoniae NOT DETECTED NOT DETECTED   Proteus species DETECTED (A) NOT DETECTED   Salmonella species NOT DETECTED NOT DETECTED   Serratia marcescens NOT DETECTED NOT DETECTED   Haemophilus influenzae NOT DETECTED NOT DETECTED   Neisseria meningitidis NOT DETECTED NOT DETECTED   Pseudomonas aeruginosa NOT DETECTED NOT DETECTED   Stenotrophomonas maltophilia NOT DETECTED NOT DETECTED   Candida albicans NOT DETECTED NOT DETECTED   Candida auris NOT  DETECTED NOT DETECTED   Candida glabrata NOT DETECTED NOT DETECTED   Candida krusei NOT DETECTED NOT DETECTED   Candida parapsilosis NOT DETECTED NOT DETECTED   Candida tropicalis NOT DETECTED NOT DETECTED   Cryptococcus neoformans/gattii NOT DETECTED NOT DETECTED   CTX-M ESBL NOT DETECTED NOT DETECTED   Carbapenem resistance IMP NOT DETECTED NOT DETECTED   Carbapenem resistance KPC NOT DETECTED NOT DETECTED   Carbapenem resistance NDM NOT DETECTED NOT DETECTED   Carbapenem resist OXA 48 LIKE NOT DETECTED NOT DETECTED   Carbapenem resistance VIM NOT DETECTED NOT DETECTED   Barth Kirks, PharmD, BCPS, BCCCP Clinical Pharmacist [phone]  Please check AMION for all [place] numbers  11/05/2019 12:03 PM

## 2019-11-05 NOTE — Progress Notes (Signed)
Chaplain provided prayer and emotional/spiritual support to pt.  Pt complains of pain in chest. She was quite teary and distressed, and mentioned being very worried about her daughter. Dau Elmyra Ricks) has possible mental health issues (though specific issue was not clear), but dau will not accept support. Prayer is very meaningful to pt.  Since pt appeared so distraught and teary, chaplain will recommend f/u to floor chaplain if appropriate.  Please contact our department if support is needed earlier.  Luana Shu [phone]     11/05/19 2100  Clinical Encounter Type  Visited With Patient  Visit Type Initial;Spiritual support  Referral From Palliative care team  Consult/Referral To Chaplain  Stress Factors  Patient Stress Factors Family relationships;Health changes

## 2019-11-05 NOTE — Consult Note (Signed)
Palliative Medicine Inpatient Consult Note  Reason for consult:  Goals of Care  HPI:  Per intake H&P --> [name] is a 86 y.o. female with history of A. fib, chronic kidney disease stage IV, chronic diastolic CHF, diabetes mellitus type 2 has been recently doing poorly with poor appetite eating very less becoming more weak and confused.  Recently was seen by cardiologist Dr. Tamala Julian at that time patient's spironolactone was discontinued because of worsening renal function.  Dr. Tamala Julian and Dr. Radene Ou  recommended hospice consult, [facility] visited the patient at her home and recommended transfer to hospital given her clinical instability at that time.   Palliative care was asked to get involved to aid on goals of care conversations.   Clinical Assessment/Goals of Care: I have reviewed medical records including EPIC notes, labs and imaging, received report from bedside RN, assessed the patient who was groaning when I was at bedside. (+) complaints on lower extremity pain.    I called [name] (daughter) to further discuss diagnosis prognosis, GOC, EOL wishes, disposition and options.   I introduced Palliative Medicine as specialized medical care for people living with serious illness. It focuses on providing relief from the symptoms and stress of a serious illness. The goal is to improve quality of life for both the patient and the family.  I asked [name] to tell me a little about her mother. She shares that she is from [facility]. She has been born and raised here. She os a wodpw - [name] is her only child. 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 She is a woman of faith and practices within the [place] denomination.  In terms of Jiovanna's living situation, she lives with her daughter and her daughters wife. They help assist Adisa in all bADL's, she is completely reliant upon them for help with all care needs. They are now living in  an apartment though eventually plan on moving to [employer].   Per Glyn Ade has been declining for the past year or so. She has lost weight and been less mobile. She has had ongoing issues with her dHF and CKDIV. Many medication adjustments had recently been made to help to aid in not further damaging the kidneys though allowing duiresis.   On the day of admission patient was being evaluated by [facility] hosp[facility]. Noted to have an increased HR at that time and per [name] they were advised to bring her to the hospital for treatment.   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    Concepts specific to code status, artifical feeding and hydration, continued IV antibiotics and rehospitalization was had. I completed a MOST form today. The patient and family outlined their wishes for the following treatment decisions:  Cardiopulmonary Resuscitation: Do Not Attempt Resuscitation (DNR/No CPR)  Medical Interventions: Comfort Measures: Keep clean, warm, and dry. Use medication by any route, positioning, wound care, and other measures to relieve pain and suffering. Use oxygen, suction and manual treatment of airway obstruction as needed for comfort. Do not transfer to the hospital unless comfort needs cannot be met in current location.  Antibiotics: Determine use of limitation of antibiotics when infection occurs  IV Fluids: IV fluids for a defined trial period  Feeding Tube: No feeding tube   The goals at the present time are for [name] to get through this acute phase of illness and  transition back to her home with hospice enrollment. [name] shares that her father was on hospice care, she expressed that she understands their services and their role in [place] care.  Discussed the importance of continued conversation with family and their  medical providers regarding overall plan of care and treatment options, ensuring  decisions are within the context of the patients values and GOCs.  Provided"Hard Choices for [employer]" booklet.   Decision Maker: [name] (Daughter) [phone]  SUMMARY OF RECOMMENDATIONS   DNAR/DNI  E-MOST completed in [place]  Medically optimize through this acute phase, plan to discharge on hospice care  [facility]  Code Status/Advance Care Planning: DNAR/DNI   Symptom Management:  Generalized Pain:                 - Tylenol 1g PO TID  Failure to Thrive - albumin 2.1:  - Dietician consulted  - Encourage 1:1 feeding - patient unable to lift d/t gout  - Supplemental nutrients   Constipation:                 - Miralax 17g PO Qday                 - Bisacodyl 42m PO Qday PRN                 - Bisacodyl 141mPR Qday PRN   Muscular Weakness:                 - Physical Therapy Evaluation                 - Occupational Therapy Evaluation   *Help to identify techniques for family to help with mobility and bADLs   Delirium:                 - Delirium precautions                 - Get up during the day                 - Encourage a familiar face to remain present throughout the day                 - Keep blinds open and lights on during daylight hours                 - Minimize the use of opioids/benzodiazepines   Palliative Prophylaxis:   Oral care, turn Q2H, Pain  Additional Recommendations (Limitations, Scope, Preferences):  Treat what is treatable   Psycho-social/Spiritual:   Desire for further Chaplaincy support: Yes - Christian  Additional Recommendations: Education on hospice   Prognosis: Poor in the setting of FTT < 6 months  Discharge Planning:   PPS: 20-30%    This conversation/these recommendations were discussed with patient primary care team, Dr. McThereasa SoloTime In: 0715 Time Out: 0830 Total Time: 75 Greater than 50%  of this time was spent counseling and coordinating care related to the above assessment and plan.  [facility] Team Cell Phone: [phone] utilize secure chat with additional questions, if there is no response within 30 minutes please call the above phone number  Palliative Medicine Team providers are available by phone from 7am to 7pm daily and can be reached through the team cell phone.  Should this patient require assistance outside of these hours, please call the patient's attending physician.

## 2019-11-05 NOTE — Evaluation (Signed)
 Occupational Therapy Evaluation Patient Details Name: [name] MRN: [id number] DOB: 1933/10/11 Today's Date: 11/05/2019    History of Present Illness  86 y.o. female with history of A. fib, chronic kidney disease stage IV, chronic diastolic CHF, diabetes mellitus type 2 has been recently doing poorly with poor appetite eating very less becoming more weak and confused. Caridology recommend hospice consult. Pt preparing to transition home on hospice.   Clinical Impression   Pt presents with significant onset of weakness impacting ability to complete ADLs without extensive physical assist. Pt able to demonstrate self feeding with Min A, requires Max A for UB ADLs and Total A for LB ADLs. Pt requires 2 person assist Max A x 2 for bed mobility to sit EOB and for standing attempt. Pt unable to come to a complete stand with RW and pt fearful of falling. However, pt did demonstrate ability to maintain balance sitting EOB for simple ADLs once positioned properly. In collaboration with PT and contacting daughter via phone during session, formulated plan of needed DME to maximize pt/family safety.     Follow Up Recommendations  Supervision/Assistance - 24 hour;Other (comment) (HHOT if in collaboration with goals of hospice care)    Equipment Recommendations  Wheelchair (measurements OT);Wheelchair cushion (measurements OT);Hospital bed; [facility] lift   Recommendations for Other Services       Precautions / Restrictions Precautions Precautions: Fall Restrictions Weight Bearing Restrictions: No      Mobility Bed Mobility Overal bed mobility: Needs Assistance Bed Mobility: Supine to Sit;Sit to Supine     Supine to sit: Max assist;+2 for physical assistance;HOB elevated Sit to supine: Total assist;+2 for physical assistance      Transfers Overall transfer level: Needs assistance Equipment used: Rolling walker (2 wheeled) Transfers: Sit to/from Stand Sit to Stand: Max assist;+2  safety/equipment;+2 physical assistance         General transfer comment: pt unable to fully extend hips, lacking roughly 25% hip extension during stand attempt    Balance Overall balance assessment: Needs assistance Sitting-balance support: Bilateral upper extremity supported;Feet supported Sitting balance-Leahy Scale: Poor Sitting balance - Comments: modA initially progressing to minG at edge of bed with time Postural control: Posterior lean Standing balance support: Bilateral upper extremity supported Standing balance-Leahy Scale: Zero Standing balance comment: maxA x2 with BUE support of RW                           ADL either performed or assessed with clinical judgement   ADL Overall ADL's : Needs assistance/impaired Eating/Feeding: Minimal assistance;Sitting Eating/Feeding Details (indicate cue type and reason): Min A for self feeding. assistance needed to bring cup to mouth, scoop pudding. Pt improved when repositioned but continued to have difficulty with coordination Grooming: Supervision/safety;Sitting;Wash/dry face Grooming Details (indicate cue type and reason): Supervision to wash face with cues sitting EOB Upper Body Bathing: Maximal assistance;Sitting   Lower Body Bathing: Total assistance;Bed level   Upper Body Dressing : Maximal assistance;Sitting   Lower Body Dressing: Total assistance;Bed level       Toileting- Clothing Manipulation and Hygiene: Total assistance;Bed level         General ADL Comments: Pt with recent rapid decline in health. Decreased strength, endurance, balance, and fear of falling     Vision Patient Visual Report: No change from baseline Vision Assessment?: No apparent visual deficits     Perception     Praxis      Pertinent Vitals/Pain Pain  Assessment: Faces Faces Pain Scale: Hurts even more Pain Location: LLE Pain Descriptors / Indicators: Sore Pain Intervention(s): Monitored during session     Hand  Dominance Right   Extremity/Trunk Assessment Upper Extremity Assessment Upper Extremity Assessment: Generalized weakness   Lower Extremity Assessment Lower Extremity Assessment: Defer to PT evaluation RLE Deficits / Details: ROM limited by pain in LE at this time LLE Deficits / Details: ROM limited by pain in LE at this time   Cervical / Trunk Assessment Cervical / Trunk Assessment: Kyphotic   Communication Communication Communication: No difficulties   Cognition Arousal/Alertness: Awake/alert Behavior During Therapy: Anxious Overall Cognitive Status: No family/caregiver present to determine baseline cognitive functioning                                 General Comments: pt is oriented to person, place, month/year, and to situation. Pt has some short term memory impairments, follows commands with increased time.   General Comments  VSS on RA    Exercises     Shoulder Instructions      Home Living Family/patient expects to be discharged to:: Private residence Living Arrangements: Children Available Help at Discharge: Family Type of Home: House Home Access: Stairs to enter Technical  of Steps: 1 Entrance Stairs-Rails:  (pt reports railing present, does not repirt which side) Home Layout: One level     Bathroom Shower/Tub: Tub/shower unit;Walk-in shower         Home Equipment: Gilford Rile - 2 wheels;Bedside commode;Tub bench          Prior Functioning/Environment Level of Independence: Needs assistance  Gait / Transfers Assistance Needed: pt was walking with a RW until 2 weeks ago, since has not been able to ambulate and requires assistance for all mobility per daughter ADL's / Homemaking Assistance Needed: requires assistance for ADLs and IADLs            OT Problem List: Decreased strength;Decreased activity tolerance;Impaired balance (sitting and/or standing);Decreased coordination;Decreased knowledge of use of DME or AE      OT  Treatment/Interventions: Self-care/ADL training;Therapeutic exercise;Energy conservation;DME and/or AE instruction;Therapeutic activities;Patient/family education    OT Goals(Current goals can be found in the care plan section) Acute Rehab OT Goals Patient Stated Goal: To go home OT Goal Formulation: With patient Time For Goal Achievement: 11/19/19 Potential to Achieve Goals: Fair ADL Goals Pt Will Perform Eating: with set-up;sitting Additional ADL Goal #1: Pt/family to demonstrate appropriate positioning to maintain skin integrity and comfort Additional ADL Goal #2: Pt/family to verbalize/demonstrate safest and most appropriate way to demonstrate ADLs  OT Frequency: Min 1X/week   Barriers to D/C:            Co-evaluation PT/OT/SLP Co-Evaluation/Treatment: Yes Reason for Co-Treatment: Complexity of the patient's impairments (multi-system involvement);Necessary to address cognition/behavior during functional activity;For patient/therapist safety;To address functional/ADL transfers PT goals addressed during session: Mobility/safety with mobility;Balance;Strengthening/ROM;Proper use of DME OT goals addressed during session: ADL's and self-care      AM-PAC OT "6 Clicks" Daily Activity     Outcome Measure Help from another person eating meals?: A Little Help from another person taking care of personal grooming?: A Little Help from another person toileting, which includes using toliet, bedpan, or urinal?: Total Help from another person bathing (including washing, rinsing, drying)?: Total Help from another person to put on and taking off regular upper body clothing?: A Lot Help from another person to put on and taking  off regular lower body clothing?: Total 6 Click Score: 11   End of Session Equipment Utilized During Treatment: Gait belt;Rolling walker Nurse Communication: Mobility status  Activity Tolerance: Patient limited by fatigue Patient left: in bed;with call bell/phone within  reach;with bed alarm set  OT Visit Diagnosis: Unsteadiness on feet (R26.81);Other abnormalities of gait and mobility (R26.89);Muscle weakness (generalized) (M62.81)                Time: 6815-9470 OT Time Calculation (min): 35 min Charges:  OT General Charges $OT Visit: 1 Visit OT Evaluation $OT Eval Moderate Complexity: 1 Mod  Layla Maw, OTR/L  Layla Maw 11/05/2019, 3:24 PM

## 2019-11-05 NOTE — Progress Notes (Addendum)
[name]  OZD:[id number] DOB: 1933/09/13 DOA: 11/04/2019 PCP: Aretta Nip, MD    Brief Narrative:  86 year old with a history of chronic atrial fibrillation, CKD stage IV, chronic diastolic CHF, and DM 2 who reported very poor appetite and severe generalized weakness with intermittent confusion. She had been seen by Dr. Pernell Dupre for routine follow-up at which time Aldactone was discontinued when he noted worsening renal function. Dr. Tamala Julian had arranged for hospice care at home and when hospice visited the patient they found her to be exceedingly weak and advised her to present to the ER. In the ED she was found to have lower extremity edema with calor. She was found to be in A. fib with RVR. Acute kidney failure was also noted with creatinine of 3.3.  Significant Events:  8/13 admit via [place]  Subjective: Heart rate now well controlled. Saturations stable on room air.  Remains quite lethargic at time of my evaluation but will wake up and answer some very simple questions.  Denies acute pain.  Denies shortness of breath nausea or vomiting.  Antimicrobials:  Flagyl 8/13 > Zosyn 8/13 > Vancomycin 8/13 >  DVT prophylaxis: Coagulopathic due to warfarin  Assessment & Plan:  Sepsis POA - Proteus bacteremia Empiric antibiotic initiated in ED -no clear evidence of active cellulitis on physical exam -UA at presentation not actually suggestive of UTI -source of Proteus bacteremia not entirely clear at this time  ?  Left lower lobe pneumonia CXR at presentation raised question of possible left lower lobe infiltrate with a possible small effusion -no hypoxia and not presently afebrile  Acute kidney failure on CKD stage IV Patient was found to have obstructive uropathy at presentation and a Foley catheter has been placed -monitor urine output and creatinine  Recent Labs  Lab 11/03/19 0000 11/04/19 1720 11/05/19 0231  CREATININE 2.91* 3.36* 3.07*    Hyperkalemia Trending  down -due to acute kidney failure in the setting of bladder outlet obstruction  Obstructive uropathy -urinary retention Foley catheter now in place  Abdominal hernia Chronic but enlarged in size lately per daughter -consider CT abdomen   Chronic diastolic CHF No present exacerbation with patient actually significantly dehydrated at presentation  Chronic atrial fibrillation with acute RVR Cardizem infusion for rate control but will likely improve with volume expansion/correction of dehydration  Coumadin induced coagulopathy Likely due to ongoing use of Coumadin in setting of very poor intake and dehydration and acute renal failure -no active bleeding -gently dosed with vitamin K  DM2 Well-controlled at present -follow trend  Severe protein calorie malnutrition   Code Status: NO CODE BLUE Family Communication:  Status is: Inpatient  Remains inpatient appropriate because:Inpatient level of care appropriate due to severity of illness   Dispo: The patient is from: Home              Anticipated d/c is to: Unclear              Anticipated d/c date is: 3 days              Patient currently is not medically stable to d/c.   Consultants:  none  Objective: Blood pressure 113/80, pulse 88, temperature 97.6 F (36.4 C), temperature source Axillary, resp. rate 20, SpO2 100 %.  Intake/Output Summary (Last 24 hours) at 11/05/2019 1003 Last data filed at 11/05/2019 0800 Gross per 24 hour  Intake 1912.86 ml  Output 3150 ml  Net -1237.14 ml   There were no  vitals filed for this visit.  Examination: General: No acute respiratory distress Lungs: Clear to auscultation bilaterally without wheezes or crackles Cardiovascular: Regular rate and rhythm without murmur gallop or rub normal S1 and S2 Abdomen: Nontender, nondistended, soft, bowel sounds positive, no rebound, no ascites, no appreciable mass Extremities: No significant cyanosis, clubbing, or edema bilateral lower  extremities  CBC: Recent Labs  Lab 11/03/19 0000 11/04/19 1720 11/05/19 0231  WBC 13.4* 17.1* 21.2*  NEUTROABS  --  14.3* 18.0*  HGB 13.0 13.6 12.8  HCT 43.1 43.5 42.9  MCV 89 87.2 91.7  PLT 323 334 094   Basic Metabolic Panel: Recent Labs  Lab 11/03/19 0000 11/03/19 0000 11/04/19 1720 11/04/19 1720 11/05/19 0231 11/05/19 0411 11/05/19 0827  NA WILL FOLLOW  --  135  --  136  --   --   K WILL FOLLOW   < > 7.5*   < > 6.4* 5.8* 5.3*  CL WILL FOLLOW  --  103  --  108  --   --   CO2 WILL FOLLOW  --  18*  --  14*  --   --   GLUCOSE 159*  --  333*  --  321*  --   --   BUN 99*  --  143*  --  140*  --   --   CREATININE 2.91*  --  3.36*  --  3.07*  --   --   CALCIUM WILL FOLLOW  --  9.3  --  9.3  --   --    < > = values in this interval not displayed.   GFR: Estimated Creatinine Clearance: 12.8 mL/min (A) (by C-G formula based on SCr of 3.07 mg/dL (H)).  Liver Function Tests: Recent Labs  Lab 11/04/19 1720 11/05/19 0231  AST 25 29  ALT 18 14  ALKPHOS 52 47  BILITOT 1.2 1.3*  PROT 6.8 5.8*  ALBUMIN 2.6* 2.1*    Coagulation Profile: Recent Labs  Lab 11/03/19 0000 11/04/19 1720 11/05/19 0827  INR 4.9* 8.3* >10.0*    Cardiac Enzymes: No results for input(s): CKTOTAL, CKMB, CKMBINDEX, TROPONINI in the last 168 hours.  HbA1C: Hemoglobin A1C  Date/Time Value Ref Range Status  10/06/2019 12:00 AM 7.4  Final  09/13/2019 11:04 AM 7.0 (A) 4.0 - 5.6 % Final  06/13/2019 09:00 AM 6.4 (A) 4.0 - 5.6 % Final   Hgb A1c MFr Bld  Date/Time Value Ref Range Status  07/18/2016 10:05 AM 6.4 4.6 - 6.5 % Final    Comment:    Glycemic Control Guidelines for People with Diabetes:Non Diabetic:  <6%Goal of Therapy: <7%Additional Action Suggested:  >8%   12/11/2015 11:43 AM 8.0 (H) 4.6 - 6.5 % Final    Comment:    Glycemic Control Guidelines for People with Diabetes:Non Diabetic:  <6%Goal of Therapy: <7%Additional Action Suggested:  >8%     CBG: Recent Labs  Lab  11/05/19 0011 11/05/19 0328 11/05/19 0543 11/05/19 0757  GLUCAP 287* 279* 218* 191*    Recent Results (from the past 240 hour(s))  Culture, blood (Routine X 2) w Reflex to ID Panel     Status: None (Preliminary result)   Collection Time: 11/04/19  5:34 PM   Specimen: BLOOD  Result Value Ref Range Status   Specimen Description BLOOD RIGHT ANTECUBITAL  Final   Special Requests   Final    BOTTLES DRAWN AEROBIC AND ANAEROBIC Blood Culture results may not be optimal due to an inadequate volume of blood  received in culture bottles   Culture  Setup Time   Final    Organism ID to follow ANAEROBIC BOTTLE ONLY Performed at [facility], [place] [address] [place], [place] [address]    Culture PENDING  Incomplete   Report Status PENDING  Incomplete  SARS Coronavirus 2 by RT PCR (hospital order, performed in [facility] hospital lab) Nasopharyngeal Nasopharyngeal Swab     Status: None   Collection Time: 11/04/19  9:17 PM   Specimen: Nasopharyngeal Swab  Result Value Ref Range Status   SARS Coronavirus 2 NEGATIVE NEGATIVE Final    Comment: (NOTE) SARS-CoV-2 target nucleic acids are NOT DETECTED.  The SARS-CoV-2 RNA is generally detectable in upper and lower respiratory specimens during the acute phase of infection. The lowest concentration of SARS-CoV-2 viral copies this assay can detect is 250 copies / mL. A negative result does not preclude SARS-CoV-2 infection and should not be used as the sole basis for treatment or other patient management decisions.  A negative result may occur with improper specimen collection / handling, submission of specimen other than nasopharyngeal swab, presence of viral mutation(s) within the areas targeted by this assay, and inadequate number of viral copies (<250 copies / mL). A negative result must be combined with clinical observations, patient history, and epidemiological information.  Fact Sheet for Patients:    StrictlyIdeas.no  Fact Sheet for Healthcare Providers: BankingDealers.co.za  This test is not yet approved or  cleared by the [place] FDA and has been authorized for detection and/or diagnosis of SARS-CoV-2 by FDA under an Emergency Use Authorization (EUA).  This EUA will remain in effect (meaning this test can be used) for the duration of the COVID-19 declaration under Section 564(b)(1) of the Act, 21 U.S.C. section 360bbb-3(b)(1), unless the authorization is terminated or revoked sooner.  Performed at [facility], [place] [address] [place], [place] [address]      Scheduled Meds: . acetaminophen  1,000 mg Oral TID  . atorvastatin  40 mg Oral Daily  . diltiazem  180 mg Oral Daily  . insulin aspart  0-9 Units Subcutaneous Q4H  . metoprolol tartrate  100 mg Oral BID   Continuous Infusions: . sodium chloride 75 mL/hr at 11/05/19 0042  . [START ON 11/06/2019] ceFEPime (MAXIPIME) IV    . diltiazem (CARDIZEM) infusion 10 mg/hr (11/05/19 0510)  . metronidazole 500 mg (11/05/19 6644)  . vancomycin Stopped (11/04/19 1954)     LOS: 1 day   Cherene Altes, MD [facility] Office  [phone] Pager - Text Page per Amion  If 7PM-7AM, please contact night-coverage per Amion 11/05/2019, 10:03 AM

## 2019-11-05 NOTE — Progress Notes (Addendum)
Critical Potassium 6.4  received from Santiago Glad in the lab. Dr Myna Hidalgo was notified.  6195 New orders received from Dr Myna Hidalgo.  0515 Pt down to CT scan.  0533 Pt returned to floor from scan.

## 2019-11-05 NOTE — Progress Notes (Signed)
Pharmacy Antibiotic Note  [name] is a 86 y.o. female admitted on 11/04/2019 with sepsis and cellulitis.  Pharmacy has been consulted for Cefeipme dosing. Already on vancomycin. WBC elevated. Noted renal dysfunction.   Plan: Cefepime 2g IV q24h Already on vancomycin   Temp (24hrs), Avg:97.9 F (36.6 C), Min:97.6 F (36.4 C), Max:98.1 F (36.7 C)  Recent Labs  Lab 11/03/19 0000 11/04/19 1720 11/04/19 1914 11/05/19 0231  WBC 13.4* 17.1*  --  21.2*  CREATININE 2.91* 3.36*  --  3.07*  LATICACIDVEN  --  2.8* 3.8*  --     Estimated Creatinine Clearance: 12.8 mL/min (A) (by C-G formula based on SCr of 3.07 mg/dL (H)).    Allergies  Allergen Reactions  . Lotensin [Benazepril Hcl] Anaphylaxis  . Zantac [Ranitidine Hcl] Anaphylaxis  . Tape Itching and Rash    Please use paper tape   Narda Bonds, PharmD, BCPS Clinical Pharmacist Phone: [phone]

## 2019-11-05 NOTE — Evaluation (Signed)
Physical Therapy Evaluation Patient Details Name: [name] MRN: [id number] DOB: 1934/01/21 Today's Date: 11/05/2019   History of Present Illness   86 y.o. female with history of A. fib, chronic kidney disease stage IV, chronic diastolic CHF, diabetes mellitus type 2 has been recently doing poorly with poor appetite eating very less becoming more weak and confused. Caridology recommend hospice consult. Pt preparing to transition home on hospice.  Clinical Impression  Pt presents to PT with deficits in strength, power, functional mobility, gait, balance, endurance. Pt is very weak and requires significant physical assistance to perform all functional mobility tasks. Pt is unable to come to an erect standing position and requires physical support of 2 people along with RW support to obtain a partial stand. Pt will benefit from continued acute PT services during this admission to improve mobility quality, strength, and endurance. PT was able to call the pt's daughter, Elmyra Ricks, prior to start of session. Elmyra Ricks is unable to come return to the hospital during PT's operating hours however Elmyra Ricks is ok with PT performing evaluation to initiate equipment recommendations. PT recommends a hospital bed, manual wheelchair, wheelchair cushion, and mechanical lift. All of these items are necessary as the patient is unable to transfer safely at this time with and is at a high falls risk.  Patient suffers from failure to thrive, CHF, and CKD which impairs their ability to perform daily activities like bathing, dressing, toileting, etc. in the home.  A walker alone will not resolve the issues with performing activities of daily living. A wheelchair will allow patient to safely perform daily activities.  The patient can self propel in the home or has a caregiver who can provide assistance.        Follow Up Recommendations Supervision/Assistance - 24 hour (pt transitioning home with hospice)    Equipment  Recommendations  Wheelchair (measurements PT);Wheelchair cushion (measurements PT);Hospital bed (mechanical lift)    Recommendations for Other Services       Precautions / Restrictions Precautions Precautions: Fall Restrictions Weight Bearing Restrictions: No      Mobility  Bed Mobility Overal bed mobility: Needs Assistance Bed Mobility: Supine to Sit;Sit to Supine     Supine to sit: Max assist;+2 for physical assistance;HOB elevated Sit to supine: Total assist;+2 for physical assistance      Transfers Overall transfer level: Needs assistance Equipment used: Rolling walker (2 wheeled) Transfers: Sit to/from Stand Sit to Stand: Max assist;+2 safety/equipment;+2 physical assistance         General transfer comment: pt unable to fully extend hips, lacking roughly 25% hip extension during stand attempt  Ambulation/Gait                Stairs            Wheelchair Mobility    Modified Rankin (Stroke Patients Only)       Balance Overall balance assessment: Needs assistance Sitting-balance support: Bilateral upper extremity supported;Feet supported Sitting balance-Leahy Scale: Poor Sitting balance - Comments: modA initially progressing to minG at edge of bed with time Postural control: Posterior lean Standing balance support: Bilateral upper extremity supported Standing balance-Leahy Scale: Zero Standing balance comment: maxA x2 with BUE support of RW                             Pertinent Vitals/Pain Pain Assessment: Faces Faces Pain Scale: Hurts even more Pain Location: LLE Pain Descriptors / Indicators: Sore Pain Intervention(s): Monitored during  session    Home Living Family/patient expects to be discharged to:: Private residence Living Arrangements: Children Available Help at Discharge: Family Type of Home: House Home Access: Stairs to enter Entrance Stairs-Rails:  (pt reports railing present, does not repirt which  side) Entrance Stairs-Number of Steps: 1 Home Layout: One level Home Equipment: Sedan - 2 wheels;Bedside commode;Tub bench      Prior Function Level of Independence: Needs assistance   Gait / Transfers Assistance Needed: pt was walking with a RW until 2 weeks ago, since has not been able to ambulate and requires assistance for all mobility per daughter  ADL's / Homemaking Assistance Needed: requires assistance for ADLs and IADLs        Hand Dominance   Dominant Hand: Right    Extremity/Trunk Assessment   Upper Extremity Assessment Upper Extremity Assessment: Defer to OT evaluation    Lower Extremity Assessment Lower Extremity Assessment: Generalized weakness;LLE deficits/detail;RLE deficits/detail RLE Deficits / Details: ROM limited by pain in LE at this time LLE Deficits / Details: ROM limited by pain in LE at this time    Cervical / Trunk Assessment Cervical / Trunk Assessment: Kyphotic  Communication   Communication: No difficulties  Cognition Arousal/Alertness: Awake/alert Behavior During Therapy: Anxious Overall Cognitive Status: No family/caregiver present to determine baseline cognitive functioning                                 General Comments: pt is oriented to person, place, month/year, and to situation. Pt has some short term memory impairments, follows commands with increased time.      General Comments General comments (skin integrity, edema, etc.): VSS on RA    Exercises     Assessment/Plan    PT Assessment    PT Problem List         PT Treatment Interventions      PT Goals (Current goals can be found in the Care Plan section)  Acute Rehab PT Goals Patient Stated Goal: To go home PT Goal Formulation: With patient Time For Goal Achievement: 11/19/19 Potential to Achieve Goals: Fair    Frequency Min 2X/week   Barriers to discharge        Co-evaluation PT/OT/SLP Co-Evaluation/Treatment: Yes Reason for Co-Treatment:  Complexity of the patient's impairments (multi-system involvement);For patient/therapist safety;To address functional/ADL transfers PT goals addressed during session: Mobility/safety with mobility;Balance;Strengthening/ROM;Proper use of DME         AM-PAC PT "6 Clicks" Mobility  Outcome Measure Help needed turning from your back to your side while in a flat bed without using bedrails?: Total Help needed moving from lying on your back to sitting on the side of a flat bed without using bedrails?: Total Help needed moving to and from a bed to a chair (including a wheelchair)?: Total Help needed standing up from a chair using your arms (e.g., wheelchair or bedside chair)?: Total Help needed to walk in hospital room?: Total Help needed climbing 3-5 steps with a railing? : Total 6 Click Score: 6    End of Session Equipment Utilized During Treatment: Gait belt Activity Tolerance: Patient limited by fatigue Patient left: in bed;with call bell/phone within reach;with bed alarm set Nurse Communication: Mobility status PT Visit Diagnosis: Other abnormalities of gait and mobility (R26.89);Muscle weakness (generalized) (M62.81);Adult, failure to thrive (R62.7)    Time: 6759-1638 PT Time Calculation (min) (ACUTE ONLY): 34 min   Charges:   PT Evaluation $PT Eval  Moderate Complexity: 1 Mod          Zenaida Niece, PT, DPT Acute Rehabilitation Pager: [phone]   Zenaida Niece 11/05/2019, 2:47 PM

## 2019-11-06 LAB — COMPREHENSIVE METABOLIC PANEL WITH GFR
ALT: 18 U/L (ref 0–44)
AST: 21 U/L (ref 15–41)
Albumin: 2 g/dL — ABNORMAL LOW (ref 3.5–5.0)
Alkaline Phosphatase: 43 U/L (ref 38–126)
Anion gap: 14 (ref 5–15)
BUN: 125 mg/dL — ABNORMAL HIGH (ref 8–23)
CO2: 21 mmol/L — ABNORMAL LOW (ref 22–32)
Calcium: 8.7 mg/dL — ABNORMAL LOW (ref 8.9–10.3)
Chloride: 105 mmol/L (ref 98–111)
Creatinine, Ser: 2.71 mg/dL — ABNORMAL HIGH (ref 0.44–1.00)
GFR calc Af Amer: 18 mL/min — ABNORMAL LOW
GFR calc non Af Amer: 15 mL/min — ABNORMAL LOW
Glucose, Bld: 213 mg/dL — ABNORMAL HIGH (ref 70–99)
Potassium: 3.6 mmol/L (ref 3.5–5.1)
Sodium: 140 mmol/L (ref 135–145)
Total Bilirubin: 0.6 mg/dL (ref 0.3–1.2)
Total Protein: 5.3 g/dL — ABNORMAL LOW (ref 6.5–8.1)

## 2019-11-06 LAB — BLOOD CULTURE ID PANEL (REFLEXED) - BCID2

## 2019-11-06 LAB — MAGNESIUM: Magnesium: 2.2 mg/dL (ref 1.7–2.4)

## 2019-11-06 LAB — CBC
HCT: 35.7 % — ABNORMAL LOW (ref 36.0–46.0)
Hemoglobin: 11.4 g/dL — ABNORMAL LOW (ref 12.0–15.0)
MCH: 27.8 pg (ref 26.0–34.0)
MCHC: 31.9 g/dL (ref 30.0–36.0)
MCV: 87.1 fL (ref 80.0–100.0)
Platelets: 277 10*3/uL (ref 150–400)
RBC: 4.1 MIL/uL (ref 3.87–5.11)
RDW: 15.5 % (ref 11.5–15.5)
WBC: 15.6 10*3/uL — ABNORMAL HIGH (ref 4.0–10.5)
nRBC: 0 % (ref 0.0–0.2)

## 2019-11-06 LAB — LACTIC ACID, PLASMA: Lactic Acid, Venous: 2.1 mmol/L (ref 0.5–1.9)

## 2019-11-06 LAB — GLUCOSE, CAPILLARY
Glucose-Capillary: 211 mg/dL — ABNORMAL HIGH (ref 70–99)
Glucose-Capillary: 256 mg/dL — ABNORMAL HIGH (ref 70–99)
Glucose-Capillary: 282 mg/dL — ABNORMAL HIGH (ref 70–99)
Glucose-Capillary: 387 mg/dL — ABNORMAL HIGH (ref 70–99)
Glucose-Capillary: 388 mg/dL — ABNORMAL HIGH (ref 70–99)

## 2019-11-06 LAB — PROTIME-INR
INR: 9.6 (ref 0.8–1.2)
Prothrombin Time: 75 s — ABNORMAL HIGH (ref 11.4–15.2)

## 2019-11-06 LAB — PROCALCITONIN: Procalcitonin: 1.19 ng/mL

## 2019-11-06 MED ORDER — CHLORHEXIDINE GLUCONATE CLOTH 2 % EX PADS
6.0000 | MEDICATED_PAD | Freq: Every day | CUTANEOUS | Status: DC
  Administered 2019-11-06 – 2019-11-10 (×4): 6 via TOPICAL

## 2019-11-06 MED ORDER — SODIUM CHLORIDE 0.9 % IV SOLN: INTRAVENOUS | Status: DC

## 2019-11-06 MED ORDER — ENSURE ENLIVE PO LIQD
237.0000 mL | Freq: Two times a day (BID) | ORAL | Status: DC
  Administered 2019-11-06 – 2019-11-09 (×4): 237 mL via ORAL

## 2019-11-06 MED ORDER — POLYETHYLENE GLYCOL 3350 17 G PO PACK
17.0000 g | PACK | Freq: Two times a day (BID) | ORAL | Status: DC
  Administered 2019-11-07 – 2019-11-10 (×5): 17 g via ORAL
  Filled 2019-11-06 (×6): qty 1

## 2019-11-06 MED ORDER — SODIUM CHLORIDE 0.9 % IV BOLUS
500.0000 mL | Freq: Once | INTRAVENOUS | Status: AC
  Administered 2019-11-06: 500 mL via INTRAVENOUS

## 2019-11-06 MED ORDER — DILTIAZEM HCL 30 MG PO TABS
30.0000 mg | ORAL_TABLET | Freq: Three times a day (TID) | ORAL | Status: DC
  Administered 2019-11-06 – 2019-11-07 (×4): 30 mg via ORAL
  Filled 2019-11-06 (×4): qty 1

## 2019-11-06 NOTE — Progress Notes (Signed)
Manufacturing engineer (ACC)  Spoke to daughter Elmyra Ricks about [facility] hospice services once discharged back home. She agrees that patient will need DME. Discussed that we can order that for her and have it delivered by the time she is discharged back home. Plan is to go by [facility].   DME needs: Hospital bed, over the bed table, Wheelchair with cushion. [place] equipment manager has been notified and will contact DME provider to arrange delivery to the home. Home address has been verified and is correct in the chart.   Elmyra Ricks is the family member to contact to arrange time of delivery.     [facility] aware of the above. Please notify ACC when patient is ready to leave the unit at discharge. (Call [phone] or [phone] after 5pm.)      Please call with any hospice related questions.     Thank you,  Clementeen Hoof, RN, [facility] (listed on [facility] under Hospice and [place] [facility] [place])  [phone]

## 2019-11-06 NOTE — Plan of Care (Signed)
  Problem: Education: Goal: Knowledge of General Education information will improve Description: Including pain rating scale, medication(s)/side effects and non-pharmacologic comfort measures Outcome: Not Progressing   Problem: Health Behavior/Discharge Planning: Goal: Ability to manage health-related needs will improve Outcome: Not Progressing   Problem: Clinical Measurements: Goal: Ability to maintain clinical measurements within normal limits will improve Outcome: Progressing Goal: Will remain free from infection Outcome: Progressing Goal: Diagnostic test results will improve Outcome: Not Progressing Goal: Respiratory complications will improve Outcome: Progressing Goal: Cardiovascular complication will be avoided Outcome: Progressing   Problem: Activity: Goal: Risk for activity intolerance will decrease Outcome: Not Progressing   Problem: Nutrition: Goal: Adequate nutrition will be maintained Outcome: Not Progressing   Problem: Coping: Goal: Level of anxiety will decrease Outcome: Not Progressing   Problem: Elimination: Goal: Will not experience complications related to bowel motility Outcome: Progressing Goal: Will not experience complications related to urinary retention Outcome: Progressing   Problem: Pain Managment: Goal: General experience of comfort will improve Outcome: Progressing   Problem: Safety: Goal: Ability to remain free from injury will improve Outcome: Progressing   Problem: Skin Integrity: Goal: Risk for impaired skin integrity will decrease Outcome: Progressing

## 2019-11-06 NOTE — Progress Notes (Signed)
Dr Marlowe Sax was notified of pt low B/P 90/60,HR 97 and Cardizem was held, new orders to be written to continue holding meds.  0444 NS bolus 545ml started at this time.  0515 NS bolus complete.  0521 Critical Lab from [facility] received for INR-9.6. Dr Marlowe Sax notified.

## 2019-11-06 NOTE — Progress Notes (Signed)
Initial Nutrition Assessment  RD working remotely.  DOCUMENTATION CODES:   Not applicable  INTERVENTION:   -Liberalize diet to regular -Ensure Enlive po BID, each supplement provides 350 kcal and 20 grams of protein -Magic cup TID with meals, each supplement provides 290 kcal and 9 grams of protein  NUTRITION DIAGNOSIS:   Inadequate oral intake related to decreased appetite as evidenced by per patient/family report.  GOAL:   Patient will meet greater than or equal to 90% of their needs  MONITOR:   PO intake, Supplement acceptance, Labs, Weight trends, Skin, I & O's  REASON FOR ASSESSMENT:   Consult Poor PO  ASSESSMENT:   [name] is a 86 y.o. female with history of A. fib, chronic kidney disease stage IV, chronic diastolic CHF, diabetes mellitus type 2 has been recently doing poorly with poor appetite eating very less becoming more weak and confused.  Recently was seen by cardiologist Dr. Tamala Julian at that time patient's spironolactone was discontinued because of worsening renal function.  Dr. Tamala Julian also recommended hospice consult and hospice team had visited patient today and was found to be very weak and was advised to come to the ER.  Pt admitted with possible sepsis and acute on chronic stage IV kidney disease.  Reviewed I/O's: -2.9 L x 24 hours and -2.9 L since admission  UOP: 3.2 L x 24 hours  Attempted to speak with pt via phone call to hospital room, however, no answer.   Per H&P, pt with longstanding poor oral intake. No meal completion records available to assess at this time. Pt currently on a heart healthy, carb modified diet. Case discussed with Dr. Thereasa Solo; received permission to liberalize diet to regular. Pt would also benefit from addition of oral nutrition supplements to meet nutritional goals.   Reviewed wt hx; pt has experienced a 13% wt loss over the past 8 months. While this is not significant for time frame, it is concerning given her advanced  age, multiple medical problems, and poor oral intake.   Palliative care following; plan to medically optimize pt prior to discharging home on hospice.   Lab Results  Component Value Date   HGBA1C 7.4 10/06/2019   PTA DM medications are 0.5 mg repaglinide daily before breakfast and 1 mg repaglinide daily before supper .   Labs reviewed: CBGS: 196-222 (inpatient orders for glycemic control are 0-9 units insulin aspart TID with meals).   Diet Order:   Diet Order            Diet regular Room service appropriate? Yes; Fluid consistency: Thin  Diet effective now                 EDUCATION NEEDS:   No education needs have been identified at this time  Skin:  Skin Assessment: Skin Integrity Issues: Skin Integrity Issues:: Stage II Stage II: sacrum  Last BM:  Unknown  Height:   Ht Readings from Last 1 Encounters:  10/31/19 5\' 6"  (1.676 m)    Weight:   Wt Readings from Last 1 Encounters:  10/31/19 61.2 kg    Ideal Body Weight:  59.1 kg  BMI:  There is no height or weight on file to calculate BMI.  Estimated Nutritional Needs:   Kcal:  1650-1850  Protein:  75-90 grams  Fluid:  > 1.6 L    Loistine Chance, RD, LDN, [place] Registered Dietitian II Certified Diabetes Care and Education Specialist Please refer to [facility] for RD and/or RD on-call/weekend/after hours pager

## 2019-11-06 NOTE — Progress Notes (Signed)
[name]  VOH:[id number] DOB: 1934/03/03 DOA: 11/04/2019 PCP: Aretta Nip, MD    Brief Narrative:  86 year old with a history of chronic atrial fibrillation, CKD stage IV, chronic diastolic CHF, and DM 2 who reported very poor appetite and severe generalized weakness with intermittent confusion. She had been seen by Dr. Pernell Dupre for routine follow-up at which time Aldactone was discontinued when he noted worsening renal function. Dr. Tamala Julian had arranged for hospice care at home and when hospice visited the patient they found her to be exceedingly weak and advised her to present to the ER. In the ED she was found to have lower extremity edema with calor. She was found to be in A. fib with RVR. Acute kidney failure was also noted with creatinine of 3.3.  Significant Events:  8/13 admit via [place]  Subjective: Some difficulty was encountered overnight with modest hypotension.  Rate controlling medications were adjusted as a result.  Saturations remained stable.  INR has slightly improved but there is no evidence of active bleeding at present.  More alert at time of exam today though remains quite somnolent.  Complains of constipation.  Denies chest pain or shortness of breath.  Antimicrobials:  Flagyl 8/13 > Zosyn 8/13 > Vancomycin 8/13 >  DVT prophylaxis: Coagulopathic due to warfarin  Assessment & Plan:  Sepsis POA - Proteus bacteremia of unclear source  Empiric antibiotic initiated in ED - no clear evidence of active cellulitis on physical exam -UA at presentation not actually suggestive of UTI -source of Proteus bacteremia not entirely clear at this time -sepsis physiology resolved at this time  ?  Left lower lobe pneumonia CXR at presentation raised question of possible left lower lobe infiltrate with a possible small effusion -no hypoxia and not presently febrile - f/u CXR in AM  Acute kidney failure on CKD stage IV Patient was found to have obstructive uropathy at  presentation and a Foley catheter has been placed -creatinine slowly improving with volume resuscitation and relief of urinary obstruction  Recent Labs  Lab 11/03/19 0000 11/04/19 1720 11/05/19 0231 11/06/19 0422  CREATININE 2.91* 3.36* 3.07* 2.71*    Hyperkalemia Resolved - due to acute kidney failure in the setting of bladder outlet obstruction  Obstructive uropathy - urinary retention Foley catheter now in place  Abdominal hernia Chronic but enlarged in size lately per daughter - CT abdomen reveals no acute intra-abdominal findings and actually suggest that her hernia is no longer present  Chronic diastolic CHF No present exacerbation with patient actually significantly dehydrated at presentation  Chronic atrial fibrillation with acute RVR Rate variable but reasonably well controlled at present -care being complicated by transient hypotension  Coumadin induced coagulopathy Likely due to ongoing use of Coumadin in setting of very poor intake and dehydration and acute renal failure -no active bleeding -gently dosed with vitamin K -given transition towards hospice care ongoing anticoagulation not likely to benefit the patient  DM2 Reasonably controlled -goal is to avoid extremes of CBG but no need to aim for very strict control  Severe protein calorie malnutrition Diet liberalized  Code Status: NO CODE BLUE Family Communication:  Status is: Inpatient  Remains inpatient appropriate because:Inpatient level of care appropriate due to severity of illness   Dispo: The patient is from: Home              Anticipated d/c is to: Unclear              Anticipated d/c date  is: 3 days              Patient currently is not medically stable to d/c.   Consultants:  none  Objective: Blood pressure (!) 103/54, pulse 88, temperature (!) 97.5 F (36.4 C), temperature source Axillary, resp. rate 14, SpO2 99 %.  Intake/Output Summary (Last 24 hours) at 11/06/2019 0947 Last data filed  at 11/06/2019 0800 Gross per 24 hour  Intake 908.79 ml  Output 2000 ml  Net -1091.21 ml   There were no vitals filed for this visit.  Examination: General: No acute respiratory distress Lungs: CTA B Cardiovascular: irreg irreg - no M Abdomen: NT/ND, soft, BS+, no rebound Extremities: No C/C/E B LE    CBC: Recent Labs  Lab 11/04/19 1720 11/05/19 0231 11/06/19 0422  WBC 17.1* 21.2* 15.6*  NEUTROABS 14.3* 18.0*  --   HGB 13.6 12.8 11.4*  HCT 43.5 42.9 35.7*  MCV 87.2 91.7 87.1  PLT 334 269 659   Basic Metabolic Panel: Recent Labs  Lab 11/04/19 1720 11/04/19 1720 11/05/19 0231 11/05/19 0411 11/05/19 0827 11/05/19 1259 11/06/19 0422  NA 135  --  136  --   --   --  140  K 7.5*   < > 6.4*   < > 5.3* 4.3 3.6  CL 103  --  108  --   --   --  105  CO2 18*  --  14*  --   --   --  21*  GLUCOSE 333*  --  321*  --   --   --  213*  BUN 143*  --  140*  --   --   --  125*  CREATININE 3.36*  --  3.07*  --   --   --  2.71*  CALCIUM 9.3  --  9.3  --   --   --  8.7*  MG  --   --   --   --   --   --  2.2   < > = values in this interval not displayed.   GFR: Estimated Creatinine Clearance: 14.5 mL/min (A) (by C-G formula based on SCr of 2.71 mg/dL (H)).  Liver Function Tests: Recent Labs  Lab 11/04/19 1720 11/05/19 0231 11/06/19 0422  AST 25 29 21   ALT 18 14 18   ALKPHOS 52 47 43  BILITOT 1.2 1.3* 0.6  PROT 6.8 5.8* 5.3*  ALBUMIN 2.6* 2.1* 2.0*    Coagulation Profile: Recent Labs  Lab 11/03/19 0000 11/04/19 1720 11/05/19 0827 11/06/19 0422  INR 4.9* 8.3* >10.0* 9.6*    HbA1C: Hemoglobin A1C  Date/Time Value Ref Range Status  10/06/2019 12:00 AM 7.4  Final  09/13/2019 11:04 AM 7.0 (A) 4.0 - 5.6 % Final  06/13/2019 09:00 AM 6.4 (A) 4.0 - 5.6 % Final   Hgb A1c MFr Bld  Date/Time Value Ref Range Status  07/18/2016 10:05 AM 6.4 4.6 - 6.5 % Final    Comment:    Glycemic Control Guidelines for People with Diabetes:Non Diabetic:  <6%Goal of Therapy: <7%Additional  Action Suggested:  >8%   12/11/2015 11:43 AM 8.0 (H) 4.6 - 6.5 % Final    Comment:    Glycemic Control Guidelines for People with Diabetes:Non Diabetic:  <6%Goal of Therapy: <7%Additional Action Suggested:  >8%     CBG: Recent Labs  Lab 11/05/19 0757 11/05/19 1158 11/05/19 1737 11/05/19 2137 11/06/19 0746  GLUCAP 191* 79 101* 123* 211*    Recent Results (from the past  240 hour(s))  Blood culture (routine single)     Status: None (Preliminary result)   Collection Time: 11/04/19  5:15 PM   Specimen: BLOOD  Result Value Ref Range Status   Specimen Description BLOOD LEFT ANTECUBITAL  Final   Special Requests   Final    BOTTLES DRAWN AEROBIC AND ANAEROBIC Blood Culture adequate volume   Culture  Setup Time   Final    GRAM NEGATIVE RODS ANAEROBIC BOTTLE ONLY CRITICAL VALUE NOTED.  VALUE IS CONSISTENT WITH PREVIOUSLY REPORTED AND CALLED VALUE. Performed at [facility], [place] [address], [place], [place] [address]    Culture GRAM NEGATIVE RODS  Final   Report Status PENDING  Incomplete  Culture, blood (Routine X 2) w Reflex to ID Panel     Status: None (Preliminary result)   Collection Time: 11/04/19  5:34 PM   Specimen: BLOOD  Result Value Ref Range Status   Specimen Description BLOOD RIGHT ANTECUBITAL  Final   Special Requests   Final    BOTTLES DRAWN AEROBIC AND ANAEROBIC Blood Culture results may not be optimal due to an inadequate volume of blood received in culture bottles   Culture  Setup Time   Final    Organism ID to follow IN BOTH AEROBIC AND ANAEROBIC BOTTLES GRAM NEGATIVE RODS CRITICAL RESULT CALLED TO, READ BACK BY AND VERIFIED WITH: Marcelino Freestone [phone] MLM Performed at [facility], [place] [address] [place], [place] [address]    Culture GRAM NEGATIVE RODS  Final   Report Status PENDING  Incomplete  Blood Culture ID Panel (Reflexed)     Status: Abnormal   Collection Time: 11/04/19  5:34 PM  Result Value Ref Range Status   Enterococcus  faecalis NOT DETECTED NOT DETECTED Final   Enterococcus Faecium NOT DETECTED NOT DETECTED Final   Listeria monocytogenes NOT DETECTED NOT DETECTED Final   Staphylococcus species NOT DETECTED NOT DETECTED Final   Staphylococcus aureus (BCID) NOT DETECTED NOT DETECTED Final   Staphylococcus epidermidis NOT DETECTED NOT DETECTED Final   Staphylococcus lugdunensis NOT DETECTED NOT DETECTED Final   Streptococcus species NOT DETECTED NOT DETECTED Final   Streptococcus agalactiae NOT DETECTED NOT DETECTED Final   Streptococcus pneumoniae NOT DETECTED NOT DETECTED Final   Streptococcus pyogenes NOT DETECTED NOT DETECTED Final   A.calcoaceticus-baumannii NOT DETECTED NOT DETECTED Final   Bacteroides fragilis NOT DETECTED NOT DETECTED Final   Enterobacterales DETECTED (A) NOT DETECTED Final    Comment: Enterobacterales represent a large order of gram negative bacteria, not a single organism. CRITICAL RESULT CALLED TO, READ BACK BY AND VERIFIED WITH: PHARMD M MACIIA [phone] MLM    Enterobacter cloacae complex NOT DETECTED NOT DETECTED Final   Escherichia coli NOT DETECTED NOT DETECTED Final   Klebsiella aerogenes NOT DETECTED NOT DETECTED Final   Klebsiella oxytoca NOT DETECTED NOT DETECTED Final   Klebsiella pneumoniae NOT DETECTED NOT DETECTED Final   Proteus species DETECTED (A) NOT DETECTED Final    Comment: CRITICAL RESULT CALLED TO, READ BACK BY AND VERIFIED WITH: PHARMD M MACIIA [phone] MLM    Salmonella species NOT DETECTED NOT DETECTED Final   Serratia marcescens NOT DETECTED NOT DETECTED Final   Haemophilus influenzae NOT DETECTED NOT DETECTED Final   Neisseria meningitidis NOT DETECTED NOT DETECTED Final   Pseudomonas aeruginosa NOT DETECTED NOT DETECTED Final   Stenotrophomonas maltophilia NOT DETECTED NOT DETECTED Final   Candida albicans NOT DETECTED NOT DETECTED Final   Candida auris NOT  DETECTED NOT DETECTED Final   Candida glabrata NOT DETECTED NOT DETECTED Final    Candida krusei NOT DETECTED NOT DETECTED Final   Candida parapsilosis NOT DETECTED NOT DETECTED Final   Candida tropicalis NOT DETECTED NOT DETECTED Final   Cryptococcus neoformans/gattii NOT DETECTED NOT DETECTED Final   CTX-M ESBL NOT DETECTED NOT DETECTED Final   Carbapenem resistance IMP NOT DETECTED NOT DETECTED Final   Carbapenem resistance KPC NOT DETECTED NOT DETECTED Final   Carbapenem resistance NDM NOT DETECTED NOT DETECTED Final   Carbapenem resist OXA 48 LIKE NOT DETECTED NOT DETECTED Final   Carbapenem resistance VIM NOT DETECTED NOT DETECTED Final    Comment: Performed at [facility], [place] [address], [place], [place] [address]  SARS Coronavirus 2 by RT PCR (hospital order, performed in [facility] hospital lab) Nasopharyngeal Nasopharyngeal Swab     Status: None   Collection Time: 11/04/19  9:17 PM   Specimen: Nasopharyngeal Swab  Result Value Ref Range Status   SARS Coronavirus 2 NEGATIVE NEGATIVE Final    Comment: (NOTE) SARS-CoV-2 target nucleic acids are NOT DETECTED.  The SARS-CoV-2 RNA is generally detectable in upper and lower respiratory specimens during the acute phase of infection. The lowest concentration of SARS-CoV-2 viral copies this assay can detect is 250 copies / mL. A negative result does not preclude SARS-CoV-2 infection and should not be used as the sole basis for treatment or other patient management decisions.  A negative result may occur with improper specimen collection / handling, submission of specimen other than nasopharyngeal swab, presence of viral mutation(s) within the areas targeted by this assay, and inadequate number of viral copies (<250 copies / mL). A negative result must be combined with clinical observations, patient history, and epidemiological information.  Fact Sheet for Patients:   StrictlyIdeas.no  Fact Sheet for Healthcare Providers: BankingDealers.co.za  This test is  not yet approved or  cleared by the [place] FDA and has been authorized for detection and/or diagnosis of SARS-CoV-2 by FDA under an Emergency Use Authorization (EUA).  This EUA will remain in effect (meaning this test can be used) for the duration of the COVID-19 declaration under Section 564(b)(1) of the Act, 21 U.S.C. section 360bbb-3(b)(1), unless the authorization is terminated or revoked sooner.  Performed at [facility], [place] [address], [place], [place] [address]      Scheduled Meds: . acetaminophen  1,000 mg Oral TID  . Chlorhexidine Gluconate Cloth  6 each Topical Daily  . feeding supplement (ENSURE ENLIVE)  237 mL Oral BID BM  . insulin aspart  0-9 Units Subcutaneous TID WC   Continuous Infusions: . cefTRIAXone (ROCEPHIN)  IV 2 g (11/05/19 1527)     LOS: 2 days   Cherene Altes, MD [facility] Office  [phone] Pager - Text Page per Shea Evans  If 7PM-7AM, please contact night-coverage per Amion 11/06/2019, 9:47 AM

## 2019-11-06 NOTE — Progress Notes (Signed)
   Palliative Medicine Inpatient Follow Up Note  Reason for consult:  Goals of Care  HPI:  Per intake H&P --> Melanie T Bynumis a 87 y.o.femalewithhistory of A. fib, chronic kidney disease stage IV, chronic diastolic CHF, diabetes mellitus type 2 has been recently doing poorly with poor appetite eating very less becoming more weak and confused. Recently was seen by cardiologist Dr. Tamala Julian at that time patient's spironolactone was discontinued because of worsening renal function. Dr. Tamala Julian and Dr. Radene Ou  recommended hospice consult, [facility] visited the patient at her home and recommended transfer to hospital given her clinical instability at that time.   Palliative care was asked to get involved to aid on goals of care conversations.   Today's Discussion (11/06/2019): Chart reviewed. Patient was more alert this morning able to tell me that she was not feeling very well overall. She does not complain of pain in her right foot.   Per review of VS and labs there have been improvements.  I was able to call patients daughter,  [name] to provide a brief update. She was thankful for this.  Plan remains for patient to transition home with hospice once medically optimized.  Discussed the importance of continued conversation with family and their  medical providers regarding overall plan of care and treatment options, ensuring decisions are within the context of the patients values and GOCs.  Questions and concerns addressed   SUMMARY OF RECOMMENDATIONS DNAR/DNI  E-MOST completed in [facility]  Medically optimize through this acute phase, plan to discharge on hospice care  [place]  Code Status/Advance Care Planning: DNAR/DNI  Symptom Management: Generalized Pain: - Tylenol 1g PO TID  Failure to Thrive - albumin 2.1:                 - Dietician consulted                 - Encourage 1:1 feeding - patient unable to lift d/t gout                  - Supplemental nutrients  Constipation: - Miralax 17g PO Qday - Bisacodyl 5mg  PO Qday PRN - Bisacodyl 10mg  PR Qday PRN  Muscular Weakness: - Physical Therapy Evaluation - Occupational Therapy Evaluation                               *Help to identify techniques for family to help with mobility and bADLs  Delirium: - Delirium precautions - Get up during the day - Encourage a familiar face to remain present throughout the day - Keep blinds open and lights on during daylight hours - Minimize the use of opioids/benzodiazepines  Time Spent: 25 Greater than 50% of the time was spent in counseling and coordination of care ______________________________________________________________________________________ [place] Team Team Cell Phone: [phone] Please utilize secure chat with additional questions, if there is no response within 30 minutes please call the above phone number  Palliative Medicine Team providers are available by phone from 7am to 7pm daily and can be reached through the team cell phone.  Should this patient require assistance outside of these hours, please call the patient's attending physician.

## 2019-11-06 NOTE — Progress Notes (Signed)
Dr Marlowe Sax was paged for pt complaint of gas pains in abdomen and chest and is requesting medication to relieve it. New orders received for Simethicone 40mg  po-same was given at this time. Will cont to monitor.

## 2019-11-06 NOTE — Progress Notes (Signed)
ANTICOAGULATION CONSULT NOTE - Initial Consult  Pharmacy Consult for Warfarin  Indication: atrial fibrillation  Allergies  Allergen Reactions  . Lotensin [Benazepril Hcl] Anaphylaxis  . Zantac [Ranitidine Hcl] Anaphylaxis  . Tape Itching and Rash    Please use paper tape     Vital Signs: Temp: 98.2 F (36.8 C) (08/15 0331) Temp Source: Oral (08/15 0331) BP: 94/60 (08/15 0331) Pulse Rate: 88 (08/15 0331)  Labs: Recent Labs    11/04/19 1720 11/04/19 1720 11/05/19 0231 11/05/19 0827 11/06/19 0422  HGB 13.6   < > 12.8  --  11.4*  HCT 43.5  --  42.9  --  35.7*  PLT 334  --  269  --  277  LABPROT 67.1*  --   --  84.1* 75.0*  INR 8.3*  --   --  >10.0* 9.6*  CREATININE 3.36*  --  3.07*  --  2.71*   < > = values in this interval not displayed.    Estimated Creatinine Clearance: 14.5 mL/min (A) (by C-G formula based on SCr of 2.71 mg/dL (H)).   Medical History: Past Medical History:  Diagnosis Date  . Anemia due to blood loss, chronic 08/23/2013  . Atrial fibrillation ([place]) 12/09/2012  . Candidal skin infection 12/23/2012  . Chronic diastolic heart failure ([place]) 08/23/2013  . Chronic kidney disease, stage IV (severe) ([place]) 12/09/2012  . Diabetes mellitus with renal complications ([place]) 54/08/2701  . Diabetes mellitus without complication ([place])   . Dyslipidemia 12/09/2012  . Fall at home Sept. 3, 2016   Fx  3 ribs  . GERD (gastroesophageal reflux disease) 12/23/2012  . HTN (hypertension) 12/09/2012  . Hyperlipidemia 12/23/2012  . Hypertension   . Ileus, postoperative ([place]) 12/17/2012  . Intractable pain 12/03/2014  . Irregular heart beat   . Long term current use of anticoagulant therapy 02/22/2013  . On amiodarone therapy 02/22/2013  . Peptic esophagitis 08/23/2013  . Pressure injury of skin 05/21/2016  . Renal disorder   . Type II diabetes mellitus, uncontrolled ([place]) 09/06/2013  . Umbilical hernia, incarcerated - reducible 12/10/2012    Assessment: 44 YOF presenting with  weakness and suspected septic cellulits. On warfarin PTA for afib. Warfarin dosing schedule PTA is 2.5mg  F and 5mg  AOD. INR >10 on 8/14 and patient received 5mg  of oral Vitamin K. Today the patients INR is 9.6 which is supratherapeutic. No signs of bleeding per nursing and physician notes. Continue to hold warfarin for supratherapeutic INR. If further Vitamin K is ordered, consider IV Vitamin K for quicker onset of action.   Goal of Therapy:  INR 2-3 Monitor platelets by anticoagulation protocol: Yes   Plan:  Hold warfarin for supratherapeutic INR  Daily PT/INR Re-start warfarin as INR allows  Monitor for bleeding Monitor for Vitamin K needs per physician   Cephus Slater, PharmD, [facility] Resident [phone] 11/06/2019 7:26 AM

## 2019-11-07 ENCOUNTER — Telehealth: Payer: Self-pay | Admitting: Interventional Cardiology

## 2019-11-07 ENCOUNTER — Inpatient Hospital Stay (HOSPITAL_COMMUNITY): Payer: Medicare Other

## 2019-11-07 LAB — COMPREHENSIVE METABOLIC PANEL
ALT: 18 U/L (ref 0–44)
AST: 18 U/L (ref 15–41)
Albumin: 1.8 g/dL — ABNORMAL LOW (ref 3.5–5.0)
Alkaline Phosphatase: 42 U/L (ref 38–126)
Anion gap: 13 (ref 5–15)
BUN: 126 mg/dL — ABNORMAL HIGH (ref 8–23)
CO2: 23 mmol/L (ref 22–32)
Calcium: 8.2 mg/dL — ABNORMAL LOW (ref 8.9–10.3)
Chloride: 104 mmol/L (ref 98–111)
Creatinine, Ser: 2.69 mg/dL — ABNORMAL HIGH (ref 0.44–1.00)
GFR calc Af Amer: 18 mL/min — ABNORMAL LOW (ref >60)
GFR calc non Af Amer: 16 mL/min — ABNORMAL LOW (ref >60)
Glucose, Bld: 301 mg/dL — ABNORMAL HIGH (ref 70–99)
Potassium: 3.5 mmol/L (ref 3.5–5.1)
Sodium: 140 mmol/L (ref 135–145)
Total Bilirubin: 0.7 mg/dL (ref 0.3–1.2)
Total Protein: 5.3 g/dL — ABNORMAL LOW (ref 6.5–8.1)

## 2019-11-07 LAB — BASIC METABOLIC PANEL
BUN/Creatinine Ratio: 34 — ABNORMAL HIGH (ref 12–28)
BUN: 99 mg/dL (ref 8–27)
Creatinine, Ser: 2.91 mg/dL — ABNORMAL HIGH (ref 0.57–1.00)
GFR calc Af Amer: 16 mL/min/{1.73_m2} — ABNORMAL LOW (ref >59)
GFR calc non Af Amer: 14 mL/min/{1.73_m2} — ABNORMAL LOW (ref >59)
Glucose: 159 mg/dL — ABNORMAL HIGH (ref 65–99)

## 2019-11-07 LAB — CULTURE, BLOOD (ROUTINE X 2)

## 2019-11-07 LAB — CBC
HCT: 35 % — ABNORMAL LOW (ref 36.0–46.0)
Hemoglobin: 10.8 g/dL — ABNORMAL LOW (ref 12.0–15.0)
MCH: 27.1 pg (ref 26.0–34.0)
MCHC: 30.9 g/dL (ref 30.0–36.0)
MCV: 87.7 fL (ref 80.0–100.0)
Platelets: 276 10*3/uL (ref 150–400)
RBC: 3.99 MIL/uL (ref 3.87–5.11)
RDW: 15.7 % — ABNORMAL HIGH (ref 11.5–15.5)
WBC: 14 10*3/uL — ABNORMAL HIGH (ref 4.0–10.5)
nRBC: 0 % (ref 0.0–0.2)

## 2019-11-07 LAB — GLUCOSE, CAPILLARY
Glucose-Capillary: 241 mg/dL — ABNORMAL HIGH (ref 70–99)
Glucose-Capillary: 266 mg/dL — ABNORMAL HIGH (ref 70–99)
Glucose-Capillary: 251 mg/dL — ABNORMAL HIGH (ref 70–99)
Glucose-Capillary: 214 mg/dL — ABNORMAL HIGH (ref 70–99)

## 2019-11-07 LAB — CULTURE, BLOOD (SINGLE): Special Requests: ADEQUATE

## 2019-11-07 LAB — PROTIME-INR
INR: 6.1 (ref 0.8–1.2)
Prothrombin Time: 52.7 seconds — ABNORMAL HIGH (ref 11.4–15.2)

## 2019-11-07 LAB — PROCALCITONIN: Procalcitonin: 0.9 ng/mL

## 2019-11-07 LAB — SPECIMEN STATUS REPORT

## 2019-11-07 MED ORDER — COLCHICINE 0.6 MG PO TABS
0.6000 mg | ORAL_TABLET | Freq: Every day | ORAL | Status: DC
  Administered 2019-11-07 – 2019-11-10 (×4): 0.6 mg via ORAL
  Filled 2019-11-07 (×5): qty 1

## 2019-11-07 MED ORDER — DILTIAZEM HCL 60 MG PO TABS
60.0000 mg | ORAL_TABLET | Freq: Three times a day (TID) | ORAL | Status: DC
  Administered 2019-11-07 – 2019-11-10 (×9): 60 mg via ORAL
  Filled 2019-11-07 (×9): qty 1

## 2019-11-07 MED ORDER — SODIUM CHLORIDE 0.45 % IV SOLN: INTRAVENOUS | Status: DC

## 2019-11-07 NOTE — Progress Notes (Signed)
Manufacturing engineer [facility])  DME ordered today for delivery ahead of discharge.    Thank you for the opportunity to participate in this pt's care.  Domenic Moras, BSN, RN [employer] [phone] (24h on call

## 2019-11-07 NOTE — Progress Notes (Signed)
Critical INR 6.1 reported from lab by Ms Rosalia Hammers, physician on call notified.

## 2019-11-07 NOTE — Care Management Important Message (Signed)
Important Message  Patient Details  Name: [name] MRN: [id number] Date of Birth: 1933/10/04   Medicare Important Message Given:  Yes  Patient  Could not sign.  Verbal consent obtain from the patient.   [name] 11/07/2019, 2:55 PM

## 2019-11-07 NOTE — Telephone Encounter (Signed)
[name] is calling back stating she received a callback, but I was unable to find documentation of this. She states if there are no questions for her a callback is not necessary, she just wanted to make him aware.

## 2019-11-07 NOTE — Progress Notes (Signed)
Patient shared shared that she did not have a good appetite this AM but enjoyed her lunch today patient ate approximately 50% of lunch (macroni &cheese and meat loaf) She also had 1 chocolate chip cookie and 1 sprite

## 2019-11-07 NOTE — Telephone Encounter (Signed)
Patient's sister calling stating the patient is in the hospital. She would like a call back to update on how she is doing and to make sure Dr. Tamala Julian knows.

## 2019-11-07 NOTE — Progress Notes (Signed)
Palliative Medicine Inpatient Follow Up Note  Reason for consult:  Goals of Care  HPI:  Per intake H&P --> Melanie T Bynumis a 86 y.o.femalewithhistory of A. fib, chronic kidney disease stage IV, chronic diastolic CHF, diabetes mellitus type 2 has been recently doing poorly with poor appetite eating very less becoming more weak and confused. Recently was seen by cardiologist Dr. Tamala Julian at that time patient's spironolactone was discontinued because of worsening renal function. Dr. Tamala Julian and Dr. Radene Ou  recommended hospice consult, [facility] visited the patient at her home and recommended transfer to hospital given her clinical instability at that time.   Palliative care was asked to get involved to aid on goals of care conversations.   Today's Discussion (11/07/2019): Chart reviewed. Assessed patient at bedside, she was conversant this morning and asking for grape juice. She stated feeling cold, we therefore provided more blankets. We talked about the plan for her to transition home once she is improved from an overall health perspective. She expresses wanting very much to get back home.   Per chart review her PO's remain quite poor though she does require 1:1 assistance with feeding. She has been seen by PT/OT to offer family recommendations.  Labs continue to steadily improve. Vit signs remain relatively stable.   I had a long talk with her sister, Antionette. I was able to answer her questions regarding what moving forward with hospice will entail. We talked about [name]'s overall trajectory once at home. I shared with [name] that she will likely acquire another infection at some point which would be assessed and treated by the Hospice team. [name] asked if I could send a message to [facility] cardiologist, Dr. Tamala Julian to alert him of her admission which I stated I would do. We talked at length about UTI's and how to identify them early on.   Plan remains for patient to  transition home with hospice once medically optimized.  Discussed the importance of continued conversation with family and their  medical providers regarding overall plan of care and treatment options, ensuring decisions are within the context of the patients values and GOCs.  Questions and concerns addressed   SUMMARY OF RECOMMENDATIONS DNAR/DNI  E-MOST completed in [facility]  Medically optimize through this acute phase, plan to discharge on hospice care  [place]  Ongoing support by PMT team incrementally, please call us with more immediate needs  Code Status/Advance Care Planning: DNAR/DNI  Symptom Management: Generalized Pain: - Tylenol 1g PO TID  Failure to Thrive - albumin 2.1:                 - Dietician consulted                 - Encourage 1:1 feeding - patient unable to lift d/t gout                 - Supplemental nutrients  Constipation: - Miralax 17g PO Qday - Bisacodyl 5mg  PO Qday PRN - Bisacodyl 10mg  PR Qday PRN  Muscular Weakness: - Physical Therapy Evaluation - Occupational Therapy Evaluation                               *Help to identify techniques for family to help with mobility and bADLs  Delirium: - Delirium precautions - Get up during the day - Encourage a familiar face to remain present throughout the day - Keep blinds open and lights  on during daylight hours - Minimize the use of opioids/benzodiazepines  Time Spent: 35 Greater than 50% of the time was spent in counseling and coordination of care ______________________________________________________________________________________ [name] Team Cell Phone: [phone] Please utilize secure chat with additional questions, if there is no response within 30  minutes please call the above phone number  Palliative Medicine Team providers are available by phone from 7am to 7pm daily and can be reached through the team cell phone.  Should this patient require assistance outside of these hours, please call the patient's attending physician.

## 2019-11-07 NOTE — Progress Notes (Signed)
Inpatient Diabetes Program Recommendations  AACE/ADA: New Consensus Statement on Inpatient Glycemic Control   Target Ranges:  Prepandial:   less than 140 mg/dL      Peak postprandial:   less than 180 mg/dL (1-2 hours)      Critically ill patients:  140 - 180 mg/dL   Results for EVONNE, RINKS (MRN [id number]) as of 11/07/2019 09:30  Ref. Range 11/06/2019 07:46 11/06/2019 11:52 11/06/2019 17:07 11/06/2019 19:17 11/06/2019 23:51 11/07/2019 02:45 11/07/2019 08:34  Glucose-Capillary Latest Ref Range: 70 - 99 mg/dL 211 (H) 282 (H) 387 (H) 388 (H) 256 (H) 241 (H) 266 (H)   Review of Glycemic Control  Diabetes history: DM2 Outpatient Diabetes medications: Prandin 0.5 mg with resident, Prandin 1 mg with supper Current orders for Inpatient glycemic control: Novolog 0-9 units TID with meals  Inpatient Diabetes Program Recommendations:    Insulin-Please consider ordering Levemir 6 units Q24H (based on 61.2 kg x 0.1 units) and increase Novolog correction to 0-15 units TID with meals.  Thanks, Barnie Alderman, RN, MSN, CDE Diabetes Coordinator Inpatient Diabetes Program [phone] (Team Pager from 8am to 5pm)

## 2019-11-07 NOTE — Progress Notes (Signed)
[name]  ZOX:[id number] DOB: 09-Feb-1934 DOA: 11/04/2019 PCP: Aretta Nip, MD    Brief Narrative:  86 year old with a history of chronic atrial fibrillation, CKD stage IV, chronic diastolic CHF, and DM 2 who reported very poor appetite and severe generalized weakness with intermittent confusion. She had been seen by Dr. Pernell Dupre for routine follow-up at which time Aldactone was discontinued when he noted worsening renal function. Dr. Tamala Julian had arranged for hospice care at home and when hospice visited the patient they found her to be exceedingly weak and advised her to present to the ER. In the ED she was found to have lower extremity edema with calor. She was found to be in A. fib with RVR. Acute kidney failure was also noted with creatinine of 3.3.  Significant Events:  8/13 admit via [place]  Subjective: Blood pressure appears to be stabilizing.  Oxygen saturations are 98% on room air.  Heart rate well controlled at this time.  CBGs trending upward.  More alert/interactive today.  States that her gout is flaring up.  Antimicrobials:  Flagyl 8/13  Zosyn 8/13  Vancomycin 8/13  Rocephin 8/14 >  DVT prophylaxis: Coagulopathic due to warfarin  Assessment & Plan:  Sepsis POA - Proteus bacteremia of unclear source  Empiric antibiotic initiated in ED - no clear evidence of active cellulitis on physical exam -UA at presentation not actually suggestive of UTI -source of Proteus bacteremia not entirely clear at this time -sepsis physiology resolved at this time  Severe azotemia BUN profoundly elevated at presentation - slowly trending downward  Recent Labs  Lab 11/03/19 0000 11/04/19 1720 11/05/19 0231 11/06/19 0422 11/07/19 0121  BUN 99* 143* 140* 125* 126*     ?  Left lower lobe pneumonia CXR at presentation raised question of possible left lower lobe infiltrate with a possible small effusion - persists on follow-up CXR today - no hypoxia and not presently febrile     Acute kidney failure on CKD stage IV Patient was found to have obstructive uropathy at presentation and a Foley catheter has been placed -creatinine slowly improving with volume resuscitation and relief of urinary obstruction  Recent Labs  Lab 11/03/19 0000 11/04/19 1720 11/05/19 0231 11/06/19 0422 11/07/19 0121  CREATININE 2.91* 3.36* 3.07* 2.71* 2.69*    Hyperkalemia Resolved - due to acute kidney failure in the setting of bladder outlet obstruction  Obstructive uropathy - urinary retention Foley catheter now in place  Abdominal hernia Chronic but enlarged in size lately per daughter - CT abdomen reveals no acute intra-abdominal findings and actually suggest that her hernia is no longer present  Chronic diastolic CHF No present exacerbation with patient dehydrated at presentation  Chronic atrial fibrillation with acute RVR Rate now well controlled  Coumadin induced coagulopathy Likely due to ongoing use of Coumadin in setting of very poor intake and dehydration and acute renal failure -no active bleeding -gently dosed with vitamin K x1 - given transition towards hospice care ongoing anticoagulation not felt to benefit the patient  DM2 Reasonably controlled -goal is to avoid extremes of CBG but no need to aim for very strict control  Severe protein calorie malnutrition Diet liberalized  Code Status: NO CODE BLUE Family Communication:  Status is: Inpatient  Remains inpatient appropriate because:Inpatient level of care appropriate due to severity of illness   Dispo: The patient is from: Home              Anticipated d/c is to: Unclear  Anticipated d/c date is: 3 days              Patient currently is not medically stable to d/c.   Consultants:  none  Objective: Blood pressure (!) 116/55, pulse 98, temperature 98.7 F (37.1 C), temperature source Oral, resp. rate 17, SpO2 100 %.  Intake/Output Summary (Last 24 hours) at 11/07/2019 1030 Last data  filed at 11/07/2019 0934 Gross per 24 hour  Intake 1959.45 ml  Output 1200 ml  Net 759.45 ml   There were no vitals filed for this visit.  Examination: General: No acute respiratory distress Lungs: CTA B Cardiovascular: irreg irreg -no rub or murmur Abdomen: NT/ND, soft, BS+, no rebound Extremities: No edema bilateral lower extremities   CBC: Recent Labs  Lab 11/04/19 1720 11/04/19 1720 11/05/19 0231 11/06/19 0422 11/07/19 0121  WBC 17.1*   < > 21.2* 15.6* 14.0*  NEUTROABS 14.3*  --  18.0*  --   --   HGB 13.6   < > 12.8 11.4* 10.8*  HCT 43.5   < > 42.9 35.7* 35.0*  MCV 87.2   < > 91.7 87.1 87.7  PLT 334   < > 269 277 276   < > = values in this interval not displayed.   Basic Metabolic Panel: Recent Labs  Lab 11/05/19 0231 11/05/19 0411 11/05/19 1259 11/06/19 0422 11/07/19 0121  NA 136  --   --  140 140  K 6.4*   < > 4.3 3.6 3.5  CL 108  --   --  105 104  CO2 14*  --   --  21* 23  GLUCOSE 321*  --   --  213* 301*  BUN 140*  --   --  125* 126*  CREATININE 3.07*  --   --  2.71* 2.69*  CALCIUM 9.3  --   --  8.7* 8.2*  MG  --   --   --  2.2  --    < > = values in this interval not displayed.   GFR: Estimated Creatinine Clearance: 14.6 mL/min (A) (by C-G formula based on SCr of 2.69 mg/dL (H)).  Liver Function Tests: Recent Labs  Lab 11/04/19 1720 11/05/19 0231 11/06/19 0422 11/07/19 0121  AST 25 29 21 18   ALT 18 14 18 18   ALKPHOS 52 47 43 42  BILITOT 1.2 1.3* 0.6 0.7  PROT 6.8 5.8* 5.3* 5.3*  ALBUMIN 2.6* 2.1* 2.0* 1.8*    Coagulation Profile: Recent Labs  Lab 11/03/19 0000 11/04/19 1720 11/05/19 0827 11/06/19 0422 11/07/19 0121  INR 4.9* 8.3* >10.0* 9.6* 6.1*    HbA1C: Hemoglobin A1C  Date/Time Value Ref Range Status  10/06/2019 12:00 AM 7.4  Final  09/13/2019 11:04 AM 7.0 (A) 4.0 - 5.6 % Final  06/13/2019 09:00 AM 6.4 (A) 4.0 - 5.6 % Final   Hgb A1c MFr Bld  Date/Time Value Ref Range Status  07/18/2016 10:05 AM 6.4 4.6 - 6.5 % Final     Comment:    Glycemic Control Guidelines for People with Diabetes:Non Diabetic:  <6%Goal of Therapy: <7%Additional Action Suggested:  >8%   12/11/2015 11:43 AM 8.0 (H) 4.6 - 6.5 % Final    Comment:    Glycemic Control Guidelines for People with Diabetes:Non Diabetic:  <6%Goal of Therapy: <7%Additional Action Suggested:  >8%     CBG: Recent Labs  Lab 11/06/19 1707 11/06/19 1917 11/06/19 2351 11/07/19 0245 11/07/19 0834  GLUCAP 387* 388* 256* 241* 266*    Recent Results (  from the past 240 hour(s))  Blood culture (routine single)     Status: Abnormal (Preliminary result)   Collection Time: 11/04/19  5:15 PM   Specimen: BLOOD  Result Value Ref Range Status   Specimen Description BLOOD LEFT ANTECUBITAL  Final   Special Requests   Final    BOTTLES DRAWN AEROBIC AND ANAEROBIC Blood Culture adequate volume   Culture  Setup Time   Final    GRAM NEGATIVE RODS IN BOTH AEROBIC AND ANAEROBIC BOTTLES CRITICAL VALUE NOTED.  VALUE IS CONSISTENT WITH PREVIOUSLY REPORTED AND CALLED VALUE. Performed at [facility], [place] [address], [place], [place] [address]    Culture PROTEUS MIRABILIS (A)  Final   Report Status PENDING  Incomplete  Culture, blood (Routine X 2) w Reflex to ID Panel     Status: Abnormal (Preliminary result)   Collection Time: 11/04/19  5:34 PM   Specimen: BLOOD  Result Value Ref Range Status   Specimen Description BLOOD RIGHT ANTECUBITAL  Final   Special Requests   Final    BOTTLES DRAWN AEROBIC AND ANAEROBIC Blood Culture results may not be optimal due to an inadequate volume of blood received in culture bottles   Culture  Setup Time   Final    Organism ID to follow IN BOTH AEROBIC AND ANAEROBIC BOTTLES GRAM NEGATIVE RODS CRITICAL RESULT CALLED TO, READ BACK BY AND VERIFIED WITH: PHARMD M [place] [id number] MLM    Culture (A)  Final    PROTEUS MIRABILIS SUSCEPTIBILITIES TO FOLLOW Performed at [facility], [place] [address] [place], [place] [address]     Report Status PENDING  Incomplete  Blood Culture ID Panel (Reflexed)     Status: Abnormal   Collection Time: 11/04/19  5:34 PM  Result Value Ref Range Status   Enterococcus faecalis NOT DETECTED NOT DETECTED Final   Enterococcus Faecium NOT DETECTED NOT DETECTED Final   Listeria monocytogenes NOT DETECTED NOT DETECTED Final   Staphylococcus species NOT DETECTED NOT DETECTED Final   Staphylococcus aureus (BCID) NOT DETECTED NOT DETECTED Final   Staphylococcus epidermidis NOT DETECTED NOT DETECTED Final   Staphylococcus lugdunensis NOT DETECTED NOT DETECTED Final   Streptococcus species NOT DETECTED NOT DETECTED Final   Streptococcus agalactiae NOT DETECTED NOT DETECTED Final   Streptococcus pneumoniae NOT DETECTED NOT DETECTED Final   Streptococcus pyogenes NOT DETECTED NOT DETECTED Final   A.calcoaceticus-baumannii NOT DETECTED NOT DETECTED Final   Bacteroides fragilis NOT DETECTED NOT DETECTED Final   Enterobacterales DETECTED (A) NOT DETECTED Final    Comment: Enterobacterales represent a large order of gram negative bacteria, not a single organism. CRITICAL RESULT CALLED TO, READ BACK BY AND VERIFIED WITH: PHARMD M MACIIA [id number] MLM    Enterobacter cloacae complex NOT DETECTED NOT DETECTED Final   Escherichia coli NOT DETECTED NOT DETECTED Final   Klebsiella aerogenes NOT DETECTED NOT DETECTED Final   Klebsiella oxytoca NOT DETECTED NOT DETECTED Final   Klebsiella pneumoniae NOT DETECTED NOT DETECTED Final   Proteus species DETECTED (A) NOT DETECTED Final    Comment: CRITICAL RESULT CALLED TO, READ BACK BY AND VERIFIED WITH: PHARMD M MACIIA [id number] MLM    Salmonella species NOT DETECTED NOT DETECTED Final   Serratia marcescens NOT DETECTED NOT DETECTED Final   Haemophilus influenzae NOT DETECTED NOT DETECTED Final   Neisseria meningitidis NOT DETECTED NOT DETECTED Final   Pseudomonas aeruginosa NOT DETECTED NOT DETECTED Final   Stenotrophomonas maltophilia NOT DETECTED  NOT DETECTED Final  Candida albicans NOT DETECTED NOT DETECTED Final   Candida auris NOT DETECTED NOT DETECTED Final   Candida glabrata NOT DETECTED NOT DETECTED Final   Candida krusei NOT DETECTED NOT DETECTED Final   Candida parapsilosis NOT DETECTED NOT DETECTED Final   Candida tropicalis NOT DETECTED NOT DETECTED Final   Cryptococcus neoformans/gattii NOT DETECTED NOT DETECTED Final   CTX-M ESBL NOT DETECTED NOT DETECTED Final   Carbapenem resistance IMP NOT DETECTED NOT DETECTED Final   Carbapenem resistance KPC NOT DETECTED NOT DETECTED Final   Carbapenem resistance NDM NOT DETECTED NOT DETECTED Final   Carbapenem resist OXA 48 LIKE NOT DETECTED NOT DETECTED Final   Carbapenem resistance VIM NOT DETECTED NOT DETECTED Final    Comment: Performed at [facility], [address], [place], [place] [address]  SARS Coronavirus 2 by RT PCR (hospital order, performed in [facility] hospital lab) Nasopharyngeal Nasopharyngeal Swab     Status: None   Collection Time: 11/04/19  9:17 PM   Specimen: Nasopharyngeal Swab  Result Value Ref Range Status   SARS Coronavirus 2 NEGATIVE NEGATIVE Final    Comment: (NOTE) SARS-CoV-2 target nucleic acids are NOT DETECTED.  The SARS-CoV-2 RNA is generally detectable in upper and lower respiratory specimens during the acute phase of infection. The lowest concentration of SARS-CoV-2 viral copies this assay can detect is 250 copies / mL. A negative result does not preclude SARS-CoV-2 infection and should not be used as the sole basis for treatment or other patient management decisions.  A negative result may occur with improper specimen collection / handling, submission of specimen other than nasopharyngeal swab, presence of viral mutation(s) within the areas targeted by this assay, and inadequate number of viral copies (<250 copies / mL). A negative result must be combined with clinical observations, patient history, and epidemiological  information.  Fact Sheet for Patients:   StrictlyIdeas.no  Fact Sheet for Healthcare Providers: BankingDealers.co.za  This test is not yet approved or  cleared by the [place] FDA and has been authorized for detection and/or diagnosis of SARS-CoV-2 by FDA under an Emergency Use Authorization (EUA).  This EUA will remain in effect (meaning this test can be used) for the duration of the COVID-19 declaration under Section 564(b)(1) of the Act, 21 U.S.C. section 360bbb-3(b)(1), unless the authorization is terminated or revoked sooner.  Performed at [facility], [place] [address] [place],  [address]      Scheduled Meds: . acetaminophen  1,000 mg Oral TID  . Chlorhexidine Gluconate Cloth  6 each Topical Daily  . diltiazem  30 mg Oral Q8H  . feeding supplement (ENSURE ENLIVE)  237 mL Oral BID BM  . insulin aspart  0-9 Units Subcutaneous TID WC  . polyethylene glycol  17 g Oral BID   Continuous Infusions: . cefTRIAXone (ROCEPHIN)  IV 2 g (11/06/19 1351)     LOS: 3 days   Cherene Altes, MD [facility] Office  [phone] Pager - Text Page per Shea Evans  If 7PM-7AM, please contact night-coverage per Amion 11/07/2019, 10:30 AM

## 2019-11-08 LAB — CBC
HCT: 34.1 % — ABNORMAL LOW (ref 36.0–46.0)
Hemoglobin: 10.6 g/dL — ABNORMAL LOW (ref 12.0–15.0)
MCH: 27.1 pg (ref 26.0–34.0)
MCHC: 31.1 g/dL (ref 30.0–36.0)
MCV: 87.2 fL (ref 80.0–100.0)
Platelets: 262 10*3/uL (ref 150–400)
RBC: 3.91 MIL/uL (ref 3.87–5.11)
RDW: 15.9 % — ABNORMAL HIGH (ref 11.5–15.5)
WBC: 13.6 10*3/uL — ABNORMAL HIGH (ref 4.0–10.5)
nRBC: 0 % (ref 0.0–0.2)

## 2019-11-08 LAB — COMPREHENSIVE METABOLIC PANEL
ALT: 18 U/L (ref 0–44)
AST: 18 U/L (ref 15–41)
Albumin: 1.7 g/dL — ABNORMAL LOW (ref 3.5–5.0)
Alkaline Phosphatase: 48 U/L (ref 38–126)
Anion gap: 11 (ref 5–15)
BUN: 110 mg/dL — ABNORMAL HIGH (ref 8–23)
CO2: 21 mmol/L — ABNORMAL LOW (ref 22–32)
Calcium: 7.8 mg/dL — ABNORMAL LOW (ref 8.9–10.3)
Chloride: 105 mmol/L (ref 98–111)
Creatinine, Ser: 2.33 mg/dL — ABNORMAL HIGH (ref 0.44–1.00)
GFR calc Af Amer: 22 mL/min — ABNORMAL LOW (ref >60)
GFR calc non Af Amer: 19 mL/min — ABNORMAL LOW (ref >60)
Glucose, Bld: 408 mg/dL — ABNORMAL HIGH (ref 70–99)
Potassium: 3.2 mmol/L — ABNORMAL LOW (ref 3.5–5.1)
Sodium: 137 mmol/L (ref 135–145)
Total Bilirubin: 0.3 mg/dL (ref 0.3–1.2)
Total Protein: 4.9 g/dL — ABNORMAL LOW (ref 6.5–8.1)

## 2019-11-08 LAB — GLUCOSE, CAPILLARY
Glucose-Capillary: 228 mg/dL — ABNORMAL HIGH (ref 70–99)
Glucose-Capillary: 251 mg/dL — ABNORMAL HIGH (ref 70–99)
Glucose-Capillary: 265 mg/dL — ABNORMAL HIGH (ref 70–99)
Glucose-Capillary: 277 mg/dL — ABNORMAL HIGH (ref 70–99)
Glucose-Capillary: 351 mg/dL — ABNORMAL HIGH (ref 70–99)
Glucose-Capillary: 371 mg/dL — ABNORMAL HIGH (ref 70–99)

## 2019-11-08 MED ORDER — INSULIN ASPART 100 UNIT/ML ~~LOC~~ SOLN
0.0000 [IU] | Freq: Every day | SUBCUTANEOUS | Status: DC
  Administered 2019-11-08: 2 [IU] via SUBCUTANEOUS
  Administered 2019-11-09: 5 [IU] via SUBCUTANEOUS
  Administered 2019-11-09: 2 [IU] via SUBCUTANEOUS
  Administered 2019-11-09: 3 [IU] via SUBCUTANEOUS

## 2019-11-08 MED ORDER — INSULIN ASPART 100 UNIT/ML ~~LOC~~ SOLN
0.0000 [IU] | Freq: Three times a day (TID) | SUBCUTANEOUS | Status: DC
  Administered 2019-11-09: 3 [IU] via SUBCUTANEOUS
  Administered 2019-11-10 (×2): 8 [IU] via SUBCUTANEOUS
  Administered 2019-11-10: 5 [IU] via SUBCUTANEOUS

## 2019-11-08 MED ORDER — REPAGLINIDE 1 MG PO TABS: 0.5000 mg | ORAL_TABLET | ORAL | Status: DC

## 2019-11-08 MED ORDER — CEFAZOLIN SODIUM-DEXTROSE 2-4 GM/100ML-% IV SOLN
2.0000 g | Freq: Two times a day (BID) | INTRAVENOUS | Status: DC
  Administered 2019-11-08 – 2019-11-10 (×5): 2 g via INTRAVENOUS
  Filled 2019-11-08 (×5): qty 100

## 2019-11-08 MED ORDER — REPAGLINIDE 1 MG PO TABS
0.5000 mg | ORAL_TABLET | Freq: Every day | ORAL | Status: DC
  Administered 2019-11-09 – 2019-11-10 (×2): 0.5 mg via ORAL
  Filled 2019-11-08 (×3): qty 0.5

## 2019-11-08 MED ORDER — WHITE PETROLATUM EX OINT
TOPICAL_OINTMENT | CUTANEOUS | Status: AC
  Administered 2019-11-08: 1
  Filled 2019-11-08: qty 28.35

## 2019-11-08 MED ORDER — REPAGLINIDE 1 MG PO TABS
1.0000 mg | ORAL_TABLET | Freq: Every day | ORAL | Status: DC
  Administered 2019-11-09 – 2019-11-10 (×2): 1 mg via ORAL
  Filled 2019-11-08 (×2): qty 1

## 2019-11-08 NOTE — Progress Notes (Signed)
[name]  MVH:[id number] DOB: June 05, 1933 DOA: 11/04/2019 PCP: Aretta Nip, MD    Brief Narrative:  86 year old with a history of chronic atrial fibrillation, CKD stage IV, chronic diastolic CHF, and DM 2 who reported very poor appetite and severe generalized weakness with intermittent confusion. She had been seen by Dr. Pernell Dupre for routine follow-up at which time Aldactone was discontinued when he noted worsening renal function. Dr. Tamala Julian had arranged for hospice care at home and when hospice visited the patient they found her to be exceedingly weak and advised her to present to the ER. In the ED she was found to have lower extremity edema with calor. She was found to be in A. fib with RVR. Acute kidney failure was also noted with creatinine of 3.3.  Significant Events:  8/13 admit via [facility]  Subjective: Blood pressure now stable.  Heart rate reasonably controlled.  Oxygen saturations 98% on room air. Resting comfortably at the time of my visit.   Antimicrobials:  Flagyl 8/13  Zosyn 8/13  Vancomycin 8/13  Rocephin 8/14 > 8/16 Cefazolin 8/17 >  DVT prophylaxis: Coagulopathic due to warfarin  Assessment & Plan:  Sepsis POA - Proteus bacteremia of unclear source (presumed UTI POA) Empiric antibiotic initiated in ED - no clear evidence of active cellulitis on physical exam -UA at presentation not actually suggestive of UTI -source of Proteus bacteremia not entirely clear at this time but most likely source is urinary tract - sepsis physiology resolved at this time - abx spectrum narrowed w/ assistance from Pharmacy - as pt has improved clinically, and goal is for ultimate d/c home w/ hospice, will consider changing to oral abx at time of d/c to complete a 10 day course of abx for bacteremia   Severe azotemia BUN profoundly elevated at presentation - slowly trending downward - encouraging oral intake   Recent Labs  Lab 11/04/19 1720 11/05/19 0231 11/06/19 0422  11/07/19 0121 11/08/19 0101  BUN 143* 140* 125* 126* 110*     ?  Left lower lobe pneumonia CXR at presentation raised question of possible left lower lobe infiltrate with a possible small effusion - persists on follow-up CXR - no hypoxia and not presently febrile - suspect this is atx instead of infection   Acute kidney failure on CKD stage IV Patient was found to have obstructive uropathy at presentation and a Foley catheter has been placed -creatinine slowly improving with volume resuscitation and relief of urinary obstruction -plan to keep Foley in place for comfort and to avoid recurrent retention at home  Recent Labs  Lab 11/04/19 1720 11/05/19 0231 11/06/19 0422 11/07/19 0121 11/08/19 0101  CREATININE 3.36* 3.07* 2.71* 2.69* 2.33*    Hyperkalemia Resolved - due to acute kidney failure in the setting of bladder outlet obstruction  Obstructive uropathy - urinary retention Foley catheter now in place  Abdominal hernia Chronic but enlarged in size lately per daughter - CT abdomen reveals no acute intra-abdominal findings and actually suggest that her hernia is no longer present  Chronic diastolic CHF No present exacerbation with patient dehydrated at presentation  Chronic atrial fibrillation with acute RVR Rate reasonably well controlled  Coumadin induced coagulopathy Likely due to ongoing use of Coumadin in setting of very poor intake and dehydration and acute renal failure -no active bleeding -gently dosed with vitamin K x1 - given transition towards hospice care ongoing anticoagulation not felt to benefit the patient  DM2 goal is simply to avoid extremes of  CBG but no need to aim for very strict control - liberalized diet in setting of very poor intake - w/ CBG approaching 400 today will adjust insulin dosing   Severe protein calorie malnutrition Diet liberalized  Code Status: NO CODE BLUE Family Communication:  Status is: Inpatient  Remains inpatient  appropriate because:Inpatient level of care appropriate due to severity of illness   Dispo: The patient is from: Home              Anticipated d/c is to: Unclear              Anticipated d/c date is: 1 day              Patient currently is not medically stable to d/c.   Consultants:  none  Objective: Blood pressure 128/68, pulse (!) 103, temperature 98.5 F (36.9 C), temperature source Oral, resp. rate 16, SpO2 100 %.  Intake/Output Summary (Last 24 hours) at 11/08/2019 0945 Last data filed at 11/08/2019 0500 Gross per 24 hour  Intake 1192.33 ml  Output 1050 ml  Net 142.33 ml   There were no vitals filed for this visit.  Examination: General: No acute respiratory distress Lungs: CTA B - no wheezing  Cardiovascular: irreg irreg  Abdomen: NT/ND, soft Extremities: No edema B LE    CBC: Recent Labs  Lab 11/04/19 1720 11/04/19 1720 11/05/19 0231 11/05/19 0231 11/06/19 0422 11/07/19 0121 11/08/19 0101  WBC 17.1*   < > 21.2*   < > 15.6* 14.0* 13.6*  NEUTROABS 14.3*  --  18.0*  --   --   --   --   HGB 13.6   < > 12.8   < > 11.4* 10.8* 10.6*  HCT 43.5   < > 42.9   < > 35.7* 35.0* 34.1*  MCV 87.2   < > 91.7   < > 87.1 87.7 87.2  PLT 334   < > 269   < > 277 276 262   < > = values in this interval not displayed.   Basic Metabolic Panel: Recent Labs  Lab 11/06/19 0422 11/07/19 0121 11/08/19 0101  NA 140 140 137  K 3.6 3.5 3.2*  CL 105 104 105  CO2 21* 23 21*  GLUCOSE 213* 301* 408*  BUN 125* 126* 110*  CREATININE 2.71* 2.69* 2.33*  CALCIUM 8.7* 8.2* 7.8*  MG 2.2  --   --    GFR: Estimated Creatinine Clearance: 16.8 mL/min (A) (by C-G formula based on SCr of 2.33 mg/dL (H)).  Liver Function Tests: Recent Labs  Lab 11/05/19 0231 11/06/19 0422 11/07/19 0121 11/08/19 0101  AST 29 21 18 18   ALT 14 18 18 18   ALKPHOS 47 43 42 48  BILITOT 1.3* 0.6 0.7 0.3  PROT 5.8* 5.3* 5.3* 4.9*  ALBUMIN 2.1* 2.0* 1.8* 1.7*    Coagulation Profile: Recent Labs  Lab  11/03/19 0000 11/04/19 1720 11/05/19 0827 11/06/19 0422 11/07/19 0121  INR 4.9* 8.3* >10.0* 9.6* 6.1*    HbA1C: Hemoglobin A1C  Date/Time Value Ref Range Status  10/06/2019 12:00 AM 7.4  Final  09/13/2019 11:04 AM 7.0 (A) 4.0 - 5.6 % Final  06/13/2019 09:00 AM 6.4 (A) 4.0 - 5.6 % Final   Hgb A1c MFr Bld  Date/Time Value Ref Range Status  07/18/2016 10:05 AM 6.4 4.6 - 6.5 % Final    Comment:    Glycemic Control Guidelines for People with Diabetes:Non Diabetic:  <6%Goal of Therapy: <7%Additional Action Suggested:  >8%  12/11/2015 11:43 AM 8.0 (H) 4.6 - 6.5 % Final    Comment:    Glycemic Control Guidelines for People with Diabetes:Non Diabetic:  <6%Goal of Therapy: <7%Additional Action Suggested:  >8%     CBG: Recent Labs  Lab 11/07/19 0834 11/07/19 1132 11/07/19 1658 11/08/19 0617 11/08/19 0835  GLUCAP 266* 251* 214* 371* 265*    Recent Results (from the past 240 hour(s))  Blood culture (routine single)     Status: Abnormal   Collection Time: 11/04/19  5:15 PM   Specimen: BLOOD  Result Value Ref Range Status   Specimen Description BLOOD LEFT ANTECUBITAL  Final   Special Requests   Final    BOTTLES DRAWN AEROBIC AND ANAEROBIC Blood Culture adequate volume   Culture  Setup Time   Final    GRAM NEGATIVE RODS IN BOTH AEROBIC AND ANAEROBIC BOTTLES CRITICAL VALUE NOTED.  VALUE IS CONSISTENT WITH PREVIOUSLY REPORTED AND CALLED VALUE.    Culture (A)  Final    PROTEUS MIRABILIS SUSCEPTIBILITIES PERFORMED ON PREVIOUS CULTURE WITHIN THE LAST 5 DAYS. Performed at [facility], [place] [address], [place], [place] [address]    Report Status 11/07/2019 FINAL  Final  Culture, blood (Routine X 2) w Reflex to ID Panel     Status: Abnormal   Collection Time: 11/04/19  5:34 PM   Specimen: BLOOD  Result Value Ref Range Status   Specimen Description BLOOD RIGHT ANTECUBITAL  Final   Special Requests   Final    BOTTLES DRAWN AEROBIC AND ANAEROBIC Blood Culture results may not  be optimal due to an inadequate volume of blood received in culture bottles   Culture  Setup Time   Final    Organism ID to follow IN BOTH AEROBIC AND ANAEROBIC BOTTLES GRAM NEGATIVE RODS CRITICAL RESULT CALLED TO, READ BACK BY AND VERIFIED WITH: Marcelino Freestone [id number] MLM Performed at [facility], [place] [address] [place], [place] [address]    Culture PROTEUS MIRABILIS (A)  Final   Report Status 11/07/2019 FINAL  Final   Organism ID, Bacteria PROTEUS MIRABILIS  Final      Susceptibility   Proteus mirabilis - MIC*    AMPICILLIN <=2 SENSITIVE Sensitive     CEFAZOLIN <=4 SENSITIVE Sensitive     CEFEPIME <=0.12 SENSITIVE Sensitive     CEFTAZIDIME <=1 SENSITIVE Sensitive     CEFTRIAXONE <=0.25 SENSITIVE Sensitive     CIPROFLOXACIN <=0.25 SENSITIVE Sensitive     GENTAMICIN <=1 SENSITIVE Sensitive     IMIPENEM 2 SENSITIVE Sensitive     TRIMETH/SULFA <=20 SENSITIVE Sensitive     AMPICILLIN/SULBACTAM <=2 SENSITIVE Sensitive     PIP/TAZO <=4 SENSITIVE Sensitive     * PROTEUS MIRABILIS  Blood Culture ID Panel (Reflexed)     Status: Abnormal   Collection Time: 11/04/19  5:34 PM  Result Value Ref Range Status   Enterococcus faecalis NOT DETECTED NOT DETECTED Final   Enterococcus Faecium NOT DETECTED NOT DETECTED Final   Listeria monocytogenes NOT DETECTED NOT DETECTED Final   Staphylococcus species NOT DETECTED NOT DETECTED Final   Staphylococcus aureus (BCID) NOT DETECTED NOT DETECTED Final   Staphylococcus epidermidis NOT DETECTED NOT DETECTED Final   Staphylococcus lugdunensis NOT DETECTED NOT DETECTED Final   Streptococcus species NOT DETECTED NOT DETECTED Final   Streptococcus agalactiae NOT DETECTED NOT DETECTED Final   Streptococcus pneumoniae NOT DETECTED NOT DETECTED Final   Streptococcus pyogenes NOT DETECTED NOT DETECTED Final   A.calcoaceticus-baumannii NOT DETECTED  NOT DETECTED Final   Bacteroides fragilis NOT DETECTED NOT DETECTED Final   Enterobacterales  DETECTED (A) NOT DETECTED Final    Comment: Enterobacterales represent a large order of gram negative bacteria, not a single organism. CRITICAL RESULT CALLED TO, READ BACK BY AND VERIFIED WITH: PHARMD M MACIIA [id number] MLM    Enterobacter cloacae complex NOT DETECTED NOT DETECTED Final   Escherichia coli NOT DETECTED NOT DETECTED Final   Klebsiella aerogenes NOT DETECTED NOT DETECTED Final   Klebsiella oxytoca NOT DETECTED NOT DETECTED Final   Klebsiella pneumoniae NOT DETECTED NOT DETECTED Final   Proteus species DETECTED (A) NOT DETECTED Final    Comment: CRITICAL RESULT CALLED TO, READ BACK BY AND VERIFIED WITH: PHARMD M MACIIA [id number] MLM    Salmonella species NOT DETECTED NOT DETECTED Final   Serratia marcescens NOT DETECTED NOT DETECTED Final   Haemophilus influenzae NOT DETECTED NOT DETECTED Final   Neisseria meningitidis NOT DETECTED NOT DETECTED Final   Pseudomonas aeruginosa NOT DETECTED NOT DETECTED Final   Stenotrophomonas maltophilia NOT DETECTED NOT DETECTED Final   Candida albicans NOT DETECTED NOT DETECTED Final   Candida auris NOT DETECTED NOT DETECTED Final   Candida glabrata NOT DETECTED NOT DETECTED Final   Candida krusei NOT DETECTED NOT DETECTED Final   Candida parapsilosis NOT DETECTED NOT DETECTED Final   Candida tropicalis NOT DETECTED NOT DETECTED Final   Cryptococcus neoformans/gattii NOT DETECTED NOT DETECTED Final   CTX-M ESBL NOT DETECTED NOT DETECTED Final   Carbapenem resistance IMP NOT DETECTED NOT DETECTED Final   Carbapenem resistance KPC NOT DETECTED NOT DETECTED Final   Carbapenem resistance NDM NOT DETECTED NOT DETECTED Final   Carbapenem resist OXA 48 LIKE NOT DETECTED NOT DETECTED Final   Carbapenem resistance VIM NOT DETECTED NOT DETECTED Final    Comment: Performed at [facility], [address], [place], [place] [address]  SARS Coronavirus 2 by RT PCR (hospital order, performed in [facility] hospital lab) Nasopharyngeal  Nasopharyngeal Swab     Status: None   Collection Time: 11/04/19  9:17 PM   Specimen: Nasopharyngeal Swab  Result Value Ref Range Status   SARS Coronavirus 2 NEGATIVE NEGATIVE Final    Comment: (NOTE) SARS-CoV-2 target nucleic acids are NOT DETECTED.  The SARS-CoV-2 RNA is generally detectable in upper and lower respiratory specimens during the acute phase of infection. The lowest concentration of SARS-CoV-2 viral copies this assay can detect is 250 copies / mL. A negative result does not preclude SARS-CoV-2 infection and should not be used as the sole basis for treatment or other patient management decisions.  A negative result may occur with improper specimen collection / handling, submission of specimen other than nasopharyngeal swab, presence of viral mutation(s) within the areas targeted by this assay, and inadequate number of viral copies (<250 copies / mL). A negative result must be combined with clinical observations, patient history, and epidemiological information.  Fact Sheet for Patients:   StrictlyIdeas.no  Fact Sheet for Healthcare Providers: BankingDealers.co.za  This test is not yet approved or  cleared by the [place] FDA and has been authorized for detection and/or diagnosis of SARS-CoV-2 by FDA under an Emergency Use Authorization (EUA).  This EUA will remain in effect (meaning this test can be used) for the duration of the COVID-19 declaration under Section 564(b)(1) of the Act, 21 U.S.C. section 360bbb-3(b)(1), unless the authorization is terminated or revoked sooner.  Performed at [facility], [place] [address],  [place], [place] [address]      Scheduled Meds: . acetaminophen  1,000 mg Oral TID  . Chlorhexidine Gluconate Cloth  6 each Topical Daily  . colchicine  0.6 mg Oral Daily  . diltiazem  60 mg Oral Q8H  . feeding supplement (ENSURE ENLIVE)  237 mL Oral BID BM  . insulin aspart  0-9 Units  Subcutaneous TID WC  . polyethylene glycol  17 g Oral BID   Continuous Infusions: . sodium chloride 50 mL/hr at 11/08/19 0550  .  ceFAZolin (ANCEF) IV 2 g (11/08/19 0944)     LOS: 4 days   Cherene Altes, MD [facility] Office  [phone] Pager - Text Page per Amion  If 7PM-7AM, please contact night-coverage per Amion 11/08/2019, 9:45 AM

## 2019-11-08 NOTE — Progress Notes (Signed)
Physical Therapy Treatment Patient Details Name: [name] MRN: [id number] DOB: 01-Jan-1934 Today's Date: 11/08/2019    History of Present Illness 86 y.o. female with history of A. fib, gout, chronic kidney disease stage IV, chronic diastolic CHF, diabetes mellitus type 2 has been recently doing poorly with poor appetite eating very less becoming more weak and confused. Caridology recommend hospice consult. Pt preparing to transition home on hospice.    PT Comments    Patient was asleep in bed at start of physical therapy session. Stated that she had a bad day and wasn't feeling great today, but agreed to participate in bed-level therapeutic exercises today. Noted hemosiderin staining/wound care dressings on LLE with LE's elevated with pillow underneath. TE: heel slides, ankle pumps, hip adduction with pillow, resisted hip abduction x 10 bilaterally. Stated arms feel "tight" and hands hurt because of her gout. Passive stretching of biceps and finger flexors x 30 sec bilaterally. Patient was left laying in bed with all needs within reach and bed alarm set.     Follow Up Recommendations  Supervision/Assistance - 24 hour     Equipment Recommendations  Wheelchair (measurements PT);Wheelchair cushion (measurements PT);Hospital bed    Recommendations for Other Services       Precautions / Restrictions Precautions Precautions: Fall Restrictions Weight Bearing Restrictions: No    Mobility  Bed Mobility               General bed mobility comments: bed level therapeutic exercises today-- not assessed  Transfers                 General transfer comment: not assessed  Ambulation/Gait             General Gait Details: not assessed   Stairs             Wheelchair Mobility    Modified Rankin (Stroke Patients Only)       Balance       Sitting balance - Comments: not assessed       Standing balance comment: not assessed                             Cognition Arousal/Alertness: Awake/alert Behavior During Therapy: WFL for tasks assessed/performed;Anxious Overall Cognitive Status: No family/caregiver present to determine baseline cognitive functioning                                        Exercises      General Comments        Pertinent Vitals/Pain Faces Pain Scale: Hurts even more Pain Location: LLE (hemosiderin staining, wound), pain in fingers (gout) Pain Descriptors / Indicators: Sore;Discomfort;Grimacing;Guarding Pain Intervention(s): Limited activity within patient's tolerance;Monitored during session    Home Living                      Prior Function            PT Goals (current goals can now be found in the care plan section) Acute Rehab PT Goals Patient Stated Goal: To go home PT Goal Formulation: With patient Time For Goal Achievement: 11/19/19 Potential to Achieve Goals: Fair Progress towards PT goals: Progressing toward goals    Frequency    Min 2X/week      PT Plan Current plan remains appropriate    Co-evaluation  AM-PAC PT "6 Clicks" Mobility   Outcome Measure  Help needed turning from your back to your side while in a flat bed without using bedrails?: Total Help needed moving from lying on your back to sitting on the side of a flat bed without using bedrails?: Total Help needed moving to and from a bed to a chair (including a wheelchair)?: Total Help needed standing up from a chair using your arms (e.g., wheelchair or bedside chair)?: Total Help needed to walk in hospital room?: Total Help needed climbing 3-5 steps with a railing? : Total 6 Click Score: 6    End of Session   Activity Tolerance: Patient limited by pain;Patient limited by fatigue Patient left: in bed;with call bell/phone within reach;with bed alarm set Nurse Communication: Mobility status PT Visit Diagnosis: Other abnormalities of gait and mobility  (R26.89);Muscle weakness (generalized) (M62.81);Adult, failure to thrive (R62.7)     Time:  -     Charges:               Livingston Diones, SPT, ATC

## 2019-11-08 NOTE — Progress Notes (Signed)
Inpatient Diabetes Program Recommendations  AACE/ADA: New Consensus Statement on Inpatient Glycemic Control  Target Ranges:  Prepandial:   less than 140 mg/dL      Peak postprandial:   less than 180 mg/dL (1-2 hours)      Critically ill patients:  140 - 180 mg/dL   Results for SHAILY, LIBRIZZI (MRN [id number]) as of 11/08/2019 09:32  Ref. Range 11/07/2019 08:34 11/07/2019 11:32 11/07/2019 16:58 11/08/2019 06:17 11/08/2019 08:35  Glucose-Capillary Latest Ref Range: 70 - 99 mg/dL 266 (H)  Novolog 5 units 251 (H)  Novolog 5 units 214 (H)  Novolog 3 units 371 (H)  Novolog 9 units 265 (H)   Review of Glycemic Control  Diabetes history: DM2 Outpatient Diabetes medications: Prandin 0.5 mg with resident, Prandin 1 mg with supper Current orders for Inpatient glycemic control: Novolog 0-9 units TID with meals  Inpatient Diabetes Program Recommendations:    Insulin-Please consider ordering Levemir 6 units Q24H (based on 61.2 kg x 0.1 units).  Thanks, Barnie Alderman, RN, MSN, CDE Diabetes Coordinator Inpatient Diabetes Program [phone] (Team Pager from 8am to 5pm)

## 2019-11-08 NOTE — TOC Initial Note (Signed)
Transition of Care [facility]) - Initial/Assessment Note    Patient Details  Name: [name] MRN: [id number] Date of Birth: [birth year]  [facility] CM/SW Contact:    Joanne Chars, LCSW Phone Number: 11/08/2019, 9:59 AM  Clinical Narrative:          CSW met with pt to discuss plan for discharge.  Pt states she has been living alone but her daughter Elmyra Ricks is moving in to assist.  Pt has been vaccinated. She has also met with Hospice.   Permission granted to speak to daughter Elmyra Ricks and to Hospice.  CSW spoke with pt daughter Elmyra Ricks who confirms this plan and asked CSW to speak with pt sister, [name], who was contacted and had questions about additional nurse aid services that would be available on days that hospice is not in the home.        Expected Discharge Plan: Home w Hospice Care Barriers to Discharge: No Barriers Identified   Patient Goals and CMS Choice Patient states their goals for this hospitalization and ongoing recovery are:: get home soon      Expected Discharge Plan and Services Expected Discharge Plan: [place] Acute Care Choice: Hospice Living arrangements for the past 2 months: Single Family Home                                      Prior Living Arrangements/Services Living arrangements for the past 2 months: Single Family Home Lives with:: Adult Children Patient language and need for interpreter reviewed:: Yes Do you feel safe going back to the place where you live?: Yes      Need for Family Participation in Patient Care: Yes (Comment) Care giver support system in place?: Yes (comment) Current home services: Hospice Criminal Activity/Legal Involvement Pertinent to Current Situation/Hospitalization: No - Comment as needed  Activities of Daily Living      Permission Sought/Granted Permission sought to share information with : Customer service manager, Family Supports    Share Information with  NAME: Elmyra Ricks, daughter  Permission granted to share info w AGENCY: Hospice        Emotional Assessment Appearance:: Appears stated age Attitude/Demeanor/Rapport: Engaged Affect (typically observed): Pleasant Orientation: : Oriented to Self, Oriented to Place Alcohol / Substance Use: Not Applicable Psych Involvement: No (comment)  Admission diagnosis:  Hyperkalemia [E87.5] Uremia [N19] Protein-calorie malnutrition, severe ([place]) [E43] Warfarin-induced coagulopathy (H[facility]) [B15.17, T45.515A] Generalized weakness [R53.1] AKI (acute kidney injury) ([place]) [N17.9] Cellulitis of left lower leg [O16.073] Sepsis ([place]) [A41.9] Patient Active Problem List   Diagnosis Date Noted  . Palliative care by specialist   . Goals of care, counseling/discussion   . DNR (do not resuscitate)   . Other chronic pain   . Sepsis ([place]) 11/04/2019  . AKI (acute kidney injury) ([place]) 11/04/2019  . Pressure injury of skin 05/21/2016  . Cellulitis of great toe of right foot 05/15/2016  . Intractable pain 12/03/2014  . Type II diabetes mellitus, uncontrolled ([place]) 09/06/2013  . Chronic diastolic heart failure ([place]) 08/23/2013  . Anemia due to blood loss, chronic 08/23/2013  . Peptic esophagitis 08/23/2013  . Encounter for therapeutic drug monitoring 04/29/2013  . On amiodarone therapy 02/22/2013  . Long term current use of anticoagulant therapy 02/22/2013  . Candidal skin infection 12/23/2012  . Hyperlipidemia 12/23/2012  . GERD (gastroesophageal reflux disease) 12/23/2012  . Diabetes mellitus  with renal complications ([place]) 69/45/0388  . Umbilical hernia, incarcerated - reducible 12/10/2012  . HTN (hypertension) 12/09/2012  . Dyslipidemia 12/09/2012  . Atrial fibrillation ([place]) 12/09/2012  . Chronic kidney disease, stage IV (severe) ([place]) 12/09/2012   PCP:  Aretta Nip, MD Pharmacy:   [id number] [address] [address]Phone: [phone] Fax: [address]Phone: [phone]: [phone]    Social Determinants of Health (SDOH) Interventions    Readmission Risk Interventions No flowsheet data found.

## 2019-11-09 LAB — BASIC METABOLIC PANEL
Anion gap: 9 (ref 5–15)
BUN: 99 mg/dL — ABNORMAL HIGH (ref 8–23)
CO2: 23 mmol/L (ref 22–32)
Calcium: 8.3 mg/dL — ABNORMAL LOW (ref 8.9–10.3)
Chloride: 104 mmol/L (ref 98–111)
Creatinine, Ser: 1.94 mg/dL — ABNORMAL HIGH (ref 0.44–1.00)
GFR calc Af Amer: 27 mL/min — ABNORMAL LOW (ref >60)
GFR calc non Af Amer: 23 mL/min — ABNORMAL LOW (ref >60)
Glucose, Bld: 227 mg/dL — ABNORMAL HIGH (ref 70–99)
Potassium: 2.8 mmol/L — ABNORMAL LOW (ref 3.5–5.1)
Sodium: 136 mmol/L (ref 135–145)

## 2019-11-09 LAB — CBC
HCT: 34.5 % — ABNORMAL LOW (ref 36.0–46.0)
Hemoglobin: 10.7 g/dL — ABNORMAL LOW (ref 12.0–15.0)
MCH: 26.8 pg (ref 26.0–34.0)
MCHC: 31 g/dL (ref 30.0–36.0)
MCV: 86.5 fL (ref 80.0–100.0)
Platelets: 251 10*3/uL (ref 150–400)
RBC: 3.99 MIL/uL (ref 3.87–5.11)
RDW: 15.7 % — ABNORMAL HIGH (ref 11.5–15.5)
WBC: 14.1 10*3/uL — ABNORMAL HIGH (ref 4.0–10.5)
nRBC: 0 % (ref 0.0–0.2)

## 2019-11-09 LAB — GLUCOSE, CAPILLARY
Glucose-Capillary: 215 mg/dL — ABNORMAL HIGH (ref 70–99)
Glucose-Capillary: 229 mg/dL — ABNORMAL HIGH (ref 70–99)
Glucose-Capillary: 405 mg/dL — ABNORMAL HIGH (ref 70–99)
Glucose-Capillary: 268 mg/dL — ABNORMAL HIGH (ref 70–99)

## 2019-11-09 MED ORDER — POTASSIUM CHLORIDE CRYS ER 20 MEQ PO TBCR
40.0000 meq | EXTENDED_RELEASE_TABLET | Freq: Once | ORAL | Status: AC
  Administered 2019-11-09: 40 meq via ORAL
  Filled 2019-11-09: qty 2

## 2019-11-09 MED ORDER — POTASSIUM CHLORIDE 10 MEQ/100ML IV SOLN
10.0000 meq | INTRAVENOUS | Status: AC
  Administered 2019-11-09 – 2019-11-10 (×5): 10 meq via INTRAVENOUS
  Filled 2019-11-09 (×4): qty 100

## 2019-11-09 NOTE — Progress Notes (Signed)
Occupational Therapy Treatment Patient Details Name: [name] MRN: [id number] DOB: 19-Dec-1933 Today's Date: 11/09/2019    History of present illness 86 y.o. female with history of A. fib, gout, chronic kidney disease stage IV, chronic diastolic CHF, diabetes mellitus type 2 has been recently doing poorly with poor appetite eating very less becoming more weak and confused. Caridology recommend hospice consult. Pt preparing to transition home on hospice.   OT comments  Pt received in bed with R lateral lean today, assisted in repositioning for comfort with Max A. Pt required Max A for washing face today due to pain in R UE and bandage on elbow impacting ROM. Communicated recommendations for ADLs, transfers, DME, and skin integrity preservation (handout left in room) with pt and daughter via phone. Pt's daughter not arriving to hospital until after 3PM today and OT unavailable for session at that time. Daughter reports pt having difficulty grasping utensils for self feeding due to gout pain. Recommend trial of built up foam grips for utenstils to maximize independence and dignity with feeding tasks. Plan to leave built up grips in pt's room. Pt and daughter verbalized understanding of all education. OT to sign off at this time.    Follow Up Recommendations  Supervision/Assistance - 24 hour;Other (comment) (HHOT if in collaboration with goals of hospice care)    Equipment Recommendations  Wheelchair (measurements OT);Wheelchair cushion (measurements OT);Hospital bed;Other (comment) Product manager lift)    Recommendations for Other Services      Precautions / Restrictions Precautions Precautions: Fall Restrictions Weight Bearing Restrictions: No       Mobility Bed Mobility               General bed mobility comments: Max A to reposition in bed for comfort with R lateral lean and pillow under hip for pressure relief  Transfers                      Balance                                            ADL either performed or assessed with clinical judgement   ADL Overall ADL's : Needs assistance/impaired   Eating/Feeding Details (indicate cue type and reason): Pt's daughter reports pt with gout pain and difficulty grasping items. recommend trial of built up red foam grips to minimize need to grasp  Grooming: Maximal assistance;Wash/dry face;Bed level Grooming Details (indicate cue type and reason): Max A to wash face bed level, difficulty reaching face due to R UE pain and large bandage on elbow                               General ADL Comments: Discussed ADL recommendations for maximize independence and safety in the home. Recommend trial of red grips.     Vision   Vision Assessment?: No apparent visual deficits   Perception     Praxis      Cognition Arousal/Alertness: Awake/alert Behavior During Therapy: WFL for tasks assessed/performed;Anxious Overall Cognitive Status: No family/caregiver present to determine baseline cognitive functioning                                 General Comments: pt is oriented to person, place, month/year, and to  situation. Pt has some short term memory impairments, follows commands with increased time.        Exercises     Shoulder Instructions       General Comments Provided pressure relief/wound prevention handout, discussed maintaining skin integrity with pt and daughter via phone. Educated on need for [employer] lift as pt with significant weakness    Pertinent Vitals/ Pain       Pain Assessment: Faces Faces Pain Scale: Hurts little more Pain Location: R UE from side lying Pain Descriptors / Indicators: Sore;Discomfort;Grimacing;Guarding Pain Intervention(s): Monitored during session;Limited activity within patient's tolerance  Home Living                                          Prior Functioning/Environment              Frequency  Min  1X/week        Progress Toward Goals  OT Goals(current goals can now be found in the care plan section)  Progress towards OT goals: Progressing toward goals  Acute Rehab OT Goals Patient Stated Goal: To go home OT Goal Formulation: With patient Time For Goal Achievement: 11/19/19 Potential to Achieve Goals: Fair ADL Goals Pt Will Perform Eating: with set-up;sitting Additional ADL Goal #1: Pt/family to demonstrate appropriate positioning to maintain skin integrity and comfort Additional ADL Goal #2: Pt/family to verbalize/demonstrate safest and most appropriate way to demonstrate ADLs  Plan Discharge plan remains appropriate    Co-evaluation                 AM-PAC OT "6 Clicks" Daily Activity     Outcome Measure   Help from another person eating meals?: A Little Help from another person taking care of personal grooming?: A Lot Help from another person toileting, which includes using toliet, bedpan, or urinal?: Total Help from another person bathing (including washing, rinsing, drying)?: Total Help from another person to put on and taking off regular upper body clothing?: A Lot Help from another person to put on and taking off regular lower body clothing?: Total 6 Click Score: 10    End of Session    OT Visit Diagnosis: Unsteadiness on feet (R26.81);Other abnormalities of gait and mobility (R26.89);Muscle weakness (generalized) (M62.81)   Activity Tolerance Patient limited by fatigue   Patient Left in bed;with call bell/phone within reach;with bed alarm set   Nurse Communication Other (comment) (daughter questions about medication)        Time: 1142-1201 OT Time Calculation (min): 19 min  Charges: OT General Charges $OT Visit: 1 Visit OT Treatments $Therapeutic Activity: 8-22 mins  Layla Maw, OTR/L   Layla Maw 11/09/2019, 12:18 PM

## 2019-11-09 NOTE — Progress Notes (Signed)
PROGRESS NOTE    [name]  YHC:[id number] DOB: Jan 15, 1934 DOA: 11/04/2019 PCP: Aretta Nip, MD   Brief Narrative: [name] is a 86 y.o. female with a history of chronic atrial fibrillation, CKD stage IV, chronic diastolic CHF, and DM 2 who reported very poor appetite and severe generalized weakness with intermittent confusion. She had been seen by Dr. Pernell Dupre for routine follow-up at which time Aldactone was discontinued when he noted worsening renal function. Dr. Tamala Julian had arranged for hospice care at home and when hospice visited the patient they found her to be exceedingly weak and advised her to present to the ER. In the ED she was found to have lower extremity edema with calor. She was found to be in A. fib with RVR. Acute kidney failure was also noted with creatinine of 3.3.   Assessment & Plan:   Principal Problem:   Sepsis ([place]) Active Problems:   Atrial fibrillation (HCC)   Chronic kidney disease, stage IV (severe) (HCC)   Diabetes mellitus with renal complications ([place])   Long term current use of anticoagulant therapy   Chronic diastolic heart failure (HCC)   Anemia due to blood loss, chronic   AKI (acute kidney injury) ([place])   Palliative care by specialist   Goals of care, counseling/discussion   DNR (do not resuscitate)   Other chronic pain   Sepsis Present on admission. Secondary to proteus bacteremia of unknown source. Currently on Cefazolin. -Continue Cefazolin  Severe azotemia Has improved with IV fluids and improvement of obstructive uropathy. Does not appear to be causing mental status change at this time. -Continue IV fluids  AKI on CKD stage IV In setting of obstructive uropathy. Improving with foley catheter  Possible left lower lobe pneumonia Ruled out as unlikely.  Hyperkalemia Secondary to AKI which is now resolved  Hypokalemia Replete as needed. -AM BMP  Obstructive oropathy Foley catheter in place.  Chronic  diastolic heart failure No exacerbation with initial dehydration -Watch fluid status with IV fluids  Chronic atrial fibrillation with RVR RVR resolved. -Continue Cardizem  Diabetes mellitus, type 2 -Continue SSI  Severe malnutrition Diet has been liberalized to allow greater variety of food.  Coumadin induced coagulopathy No bleeding. Anticoagulation discontinued secondary to hospice status.   DVT prophylaxis: SCDs Code Status:   Code Status: DNR Family Communication: None at bedside Disposition Plan: Discharge home with hospice likely in 1-2 days   Consultants:   Palliative care  Procedures:   None  Antimicrobials:  Vancomycin  Zosyn  Flagyl  Ceftriaxone  Cefazolin    Subjective: Feels anxious. No other concerns.  Objective: Vitals:   11/08/19 1430 11/08/19 1450 11/08/19 2058 11/09/19 0901  BP:   130/65   Pulse: (!) 102 (!) 109 92   Resp: 19 19 20    Temp:   98 F (36.7 C) 98.4 F (36.9 C)  TempSrc:   Oral Oral  SpO2: 100% 99% 98%     Intake/Output Summary (Last 24 hours) at 11/09/2019 1033 Last data filed at 11/09/2019 0153 Gross per 24 hour  Intake 1081.98 ml  Output 1800 ml  Net -718.02 ml   There were no vitals filed for this visit.  Examination:  General exam: Appears calm and comfortable. Respiratory system: Clear to auscultation. Respiratory effort normal. Cardiovascular system: S1 & S2 heard, RRR. No murmurs, rubs, gallops or clicks. Gastrointestinal system: Abdomen is nondistended, soft and nontender. No organomegaly or masses felt. Normal bowel sounds heard. Central nervous system: Alert  and oriented. No focal neurological deficits. Musculoskeletal: BLE edema. Significant tenderness of bilateral LE Skin: No cyanosis. Bruising of LE noted. Psychiatry: Judgement and insight appear normal. Mood & affect appropriate.     Data Reviewed: I have personally reviewed following labs and imaging studies  CBC Lab Results  Component  Value Date   WBC 14.1 (H) 11/09/2019   RBC 3.99 11/09/2019   HGB 10.7 (L) 11/09/2019   HCT 34.5 (L) 11/09/2019   MCV 86.5 11/09/2019   MCH 26.8 11/09/2019   PLT 251 11/09/2019   MCHC 31.0 11/09/2019   RDW 15.7 (H) 11/09/2019   LYMPHSABS 1.0 11/05/2019   MONOABS 2.0 (H) 11/05/2019   EOSABS 0.0 11/05/2019   BASOSABS 0.0 58/85/0277     Last metabolic panel Lab Results  Component Value Date   NA 136 11/09/2019   K 2.8 (L) 11/09/2019   CL 104 11/09/2019   CO2 23 11/09/2019   BUN 99 (H) 11/09/2019   CREATININE 1.94 (H) 11/09/2019   GLUCOSE 227 (H) 11/09/2019   GFRNONAA 23 (L) 11/09/2019   GFRAA 27 (L) 11/09/2019   CALCIUM 8.3 (L) 11/09/2019   PROT 4.9 (L) 11/08/2019   ALBUMIN 1.7 (L) 11/08/2019   BILITOT 0.3 11/08/2019   ALKPHOS 48 11/08/2019   AST 18 11/08/2019   ALT 18 11/08/2019   ANIONGAP 9 11/09/2019    CBG (last 3)  Recent Labs    11/08/19 2357 11/09/19 0322 11/09/19 0903  GLUCAP 228* 215* 229*     GFR: Estimated Creatinine Clearance: 20.2 mL/min (A) (by C-G formula based on SCr of 1.94 mg/dL (H)).  Coagulation Profile: Recent Labs  Lab 11/03/19 0000 11/04/19 1720 11/05/19 0827 11/06/19 0422 11/07/19 0121  INR 4.9* 8.3* >10.0* 9.6* 6.1*    Recent Results (from the past 240 hour(s))  Blood culture (routine single)     Status: Abnormal   Collection Time: 11/04/19  5:15 PM   Specimen: BLOOD  Result Value Ref Range Status   Specimen Description BLOOD LEFT ANTECUBITAL  Final   Special Requests   Final    BOTTLES DRAWN AEROBIC AND ANAEROBIC Blood Culture adequate volume   Culture  Setup Time   Final    GRAM NEGATIVE RODS IN BOTH AEROBIC AND ANAEROBIC BOTTLES CRITICAL VALUE NOTED.  VALUE IS CONSISTENT WITH PREVIOUSLY REPORTED AND CALLED VALUE.    Culture (A)  Final    PROTEUS MIRABILIS SUSCEPTIBILITIES PERFORMED ON PREVIOUS CULTURE WITHIN THE LAST 5 DAYS. Performed at [facility], [place] [address] [place], [place] [address]    Report  Status 11/07/2019 FINAL  Final  Culture, blood (Routine X 2) w Reflex to ID Panel     Status: Abnormal   Collection Time: 11/04/19  5:34 PM   Specimen: BLOOD  Result Value Ref Range Status   Specimen Description BLOOD RIGHT ANTECUBITAL  Final   Special Requests   Final    BOTTLES DRAWN AEROBIC AND ANAEROBIC Blood Culture results may not be optimal due to an inadequate volume of blood received in culture bottles   Culture  Setup Time   Final    Organism ID to follow IN BOTH AEROBIC AND ANAEROBIC BOTTLES GRAM NEGATIVE RODS CRITICAL RESULT CALLED TO, READ BACK BY AND VERIFIED WITH: Marcelino Freestone [id number] MLM Performed at [facility], [place] [address], [place], [place] [address]    Culture PROTEUS MIRABILIS (A)  Final   Report Status 11/07/2019 FINAL  Final   Organism ID, Bacteria PROTEUS  MIRABILIS  Final      Susceptibility   Proteus mirabilis - MIC*    AMPICILLIN <=2 SENSITIVE Sensitive     CEFAZOLIN <=4 SENSITIVE Sensitive     CEFEPIME <=0.12 SENSITIVE Sensitive     CEFTAZIDIME <=1 SENSITIVE Sensitive     CEFTRIAXONE <=0.25 SENSITIVE Sensitive     CIPROFLOXACIN <=0.25 SENSITIVE Sensitive     GENTAMICIN <=1 SENSITIVE Sensitive     IMIPENEM 2 SENSITIVE Sensitive     TRIMETH/SULFA <=20 SENSITIVE Sensitive     AMPICILLIN/SULBACTAM <=2 SENSITIVE Sensitive     PIP/TAZO <=4 SENSITIVE Sensitive     * PROTEUS MIRABILIS  Blood Culture ID Panel (Reflexed)     Status: Abnormal   Collection Time: 11/04/19  5:34 PM  Result Value Ref Range Status   Enterococcus faecalis NOT DETECTED NOT DETECTED Final   Enterococcus Faecium NOT DETECTED NOT DETECTED Final   Listeria monocytogenes NOT DETECTED NOT DETECTED Final   Staphylococcus species NOT DETECTED NOT DETECTED Final   Staphylococcus aureus (BCID) NOT DETECTED NOT DETECTED Final   Staphylococcus epidermidis NOT DETECTED NOT DETECTED Final   Staphylococcus lugdunensis NOT DETECTED NOT DETECTED Final   Streptococcus species NOT  DETECTED NOT DETECTED Final   Streptococcus agalactiae NOT DETECTED NOT DETECTED Final   Streptococcus pneumoniae NOT DETECTED NOT DETECTED Final   Streptococcus pyogenes NOT DETECTED NOT DETECTED Final   A.calcoaceticus-baumannii NOT DETECTED NOT DETECTED Final   Bacteroides fragilis NOT DETECTED NOT DETECTED Final   Enterobacterales DETECTED (A) NOT DETECTED Final    Comment: Enterobacterales represent a large order of gram negative bacteria, not a single organism. CRITICAL RESULT CALLED TO, READ BACK BY AND VERIFIED WITH: PHARMD M MACIIA [id number] MLM    Enterobacter cloacae complex NOT DETECTED NOT DETECTED Final   Escherichia coli NOT DETECTED NOT DETECTED Final   Klebsiella aerogenes NOT DETECTED NOT DETECTED Final   Klebsiella oxytoca NOT DETECTED NOT DETECTED Final   Klebsiella pneumoniae NOT DETECTED NOT DETECTED Final   Proteus species DETECTED (A) NOT DETECTED Final    Comment: CRITICAL RESULT CALLED TO, READ BACK BY AND VERIFIED WITH: PHARMD M MACIIA [phone] MLM    Salmonella species NOT DETECTED NOT DETECTED Final   Serratia marcescens NOT DETECTED NOT DETECTED Final   Haemophilus influenzae NOT DETECTED NOT DETECTED Final   Neisseria meningitidis NOT DETECTED NOT DETECTED Final   Pseudomonas aeruginosa NOT DETECTED NOT DETECTED Final   Stenotrophomonas maltophilia NOT DETECTED NOT DETECTED Final   Candida albicans NOT DETECTED NOT DETECTED Final   Candida auris NOT DETECTED NOT DETECTED Final   Candida glabrata NOT DETECTED NOT DETECTED Final   Candida krusei NOT DETECTED NOT DETECTED Final   Candida parapsilosis NOT DETECTED NOT DETECTED Final   Candida tropicalis NOT DETECTED NOT DETECTED Final   Cryptococcus neoformans/gattii NOT DETECTED NOT DETECTED Final   CTX-M ESBL NOT DETECTED NOT DETECTED Final   Carbapenem resistance IMP NOT DETECTED NOT DETECTED Final   Carbapenem resistance KPC NOT DETECTED NOT DETECTED Final   Carbapenem resistance NDM NOT  DETECTED NOT DETECTED Final   Carbapenem resist OXA 48 LIKE NOT DETECTED NOT DETECTED Final   Carbapenem resistance VIM NOT DETECTED NOT DETECTED Final    Comment: Performed at [facility], [address] [place], [place] [address]  SARS Coronavirus 2 by RT PCR (hospital order, performed in [facility] hospital lab) Nasopharyngeal Nasopharyngeal Swab     Status: None   Collection Time: 11/04/19  9:17 PM  Specimen: Nasopharyngeal Swab  Result Value Ref Range Status   SARS Coronavirus 2 NEGATIVE NEGATIVE Final    Comment: (NOTE) SARS-CoV-2 target nucleic acids are NOT DETECTED.  The SARS-CoV-2 RNA is generally detectable in upper and lower respiratory specimens during the acute phase of infection. The lowest concentration of SARS-CoV-2 viral copies this assay can detect is 250 copies / mL. A negative result does not preclude SARS-CoV-2 infection and should not be used as the sole basis for treatment or other patient management decisions.  A negative result may occur with improper specimen collection / handling, submission of specimen other than nasopharyngeal swab, presence of viral mutation(s) within the areas targeted by this assay, and inadequate number of viral copies (<250 copies / mL). A negative result must be combined with clinical observations, patient history, and epidemiological information.  Fact Sheet for Patients:   StrictlyIdeas.no  Fact Sheet for Healthcare Providers: BankingDealers.co.za  This test is not yet approved or  cleared by the [place] FDA and has been authorized for detection and/or diagnosis of SARS-CoV-2 by FDA under an Emergency Use Authorization (EUA).  This EUA will remain in effect (meaning this test can be used) for the duration of the COVID-19 declaration under Section 564(b)(1) of the Act, 21 U.S.C. section 360bbb-3(b)(1), unless the authorization is terminated or revoked  sooner.  Performed at [facility], [place] [address], [place], [place] [address]         Radiology Studies: No results found.      Scheduled Meds: . acetaminophen  1,000 mg Oral TID  . Chlorhexidine Gluconate Cloth  6 each Topical Daily  . colchicine  0.6 mg Oral Daily  . diltiazem  60 mg Oral Q8H  . feeding supplement (ENSURE ENLIVE)  237 mL Oral BID BM  . insulin aspart  0-15 Units Subcutaneous TID WC  . insulin aspart  0-5 Units Subcutaneous QHS  . polyethylene glycol  17 g Oral BID  . potassium chloride  40 mEq Oral Once  . repaglinide  0.5 mg Oral QAC breakfast  . repaglinide  1 mg Oral QAC supper   Continuous Infusions: . sodium chloride 50 mL/hr at 11/08/19 2223  .  ceFAZolin (ANCEF) IV 2 g (11/08/19 2224)  . potassium chloride       LOS: 5 days     Cordelia Poche, MD [facility] 11/09/2019, 10:33 AM  If 7PM-7AM, please contact night-coverage [email or web address]

## 2019-11-09 NOTE — Plan of Care (Signed)

## 2019-11-09 NOTE — Progress Notes (Signed)
[facility]) Hospital Liaison Note:  Spoke with TOC Leone Brand) - Plan is to possibly discharge patient back home with hospice in 1-2 days. Unsuccessfully Reached out to daughter to confirm and support leaving [facility] contact information and request for callback. Will continue to reach out to family.   Patient has a hospital bed, OBT, and wheelchair in the home and per [facility] will also need a hoyer lift upon discharge.  [facility] has ordered the new equipment with patient daughter to be contact person for delivery.    An [facility] HLT member will follow this patient daily during hospitalization.  Please feel free to reach out with any hospice related questions or concerns.  Thank you,  Gar Ponto, RN [facility] [place] (on [place]) [phone]

## 2019-11-10 ENCOUNTER — Other Ambulatory Visit: Payer: Self-pay | Admitting: Endocrinology

## 2019-11-10 DIAGNOSIS — D500 Iron deficiency anemia secondary to blood loss (chronic): Secondary | ICD-10-CM

## 2019-11-10 DIAGNOSIS — E440 Moderate protein-calorie malnutrition: Secondary | ICD-10-CM | POA: Insufficient documentation

## 2019-11-10 LAB — GLUCOSE, CAPILLARY
Glucose-Capillary: 255 mg/dL — ABNORMAL HIGH (ref 70–99)
Glucose-Capillary: 269 mg/dL — ABNORMAL HIGH (ref 70–99)
Glucose-Capillary: 211 mg/dL — ABNORMAL HIGH (ref 70–99)

## 2019-11-10 LAB — BASIC METABOLIC PANEL
Anion gap: 12 (ref 5–15)
BUN: 90 mg/dL — ABNORMAL HIGH (ref 8–23)
CO2: 19 mmol/L — ABNORMAL LOW (ref 22–32)
Calcium: 8.4 mg/dL — ABNORMAL LOW (ref 8.9–10.3)
Chloride: 103 mmol/L (ref 98–111)
Creatinine, Ser: 1.85 mg/dL — ABNORMAL HIGH (ref 0.44–1.00)
GFR calc Af Amer: 28 mL/min — ABNORMAL LOW (ref >60)
GFR calc non Af Amer: 25 mL/min — ABNORMAL LOW (ref >60)
Glucose, Bld: 299 mg/dL — ABNORMAL HIGH (ref 70–99)
Potassium: 4.1 mmol/L (ref 3.5–5.1)
Sodium: 134 mmol/L — ABNORMAL LOW (ref 135–145)

## 2019-11-10 MED ORDER — FUROSEMIDE 80 MG PO TABS: 80.0000 mg | ORAL_TABLET | Freq: Every day | ORAL | Status: AC | PRN

## 2019-11-10 MED ORDER — CEPHALEXIN 500 MG PO CAPS: 500.0000 mg | ORAL_CAPSULE | Freq: Two times a day (BID) | ORAL | 0 refills | Status: AC

## 2019-11-10 MED ORDER — ENSURE ENLIVE PO LIQD: 237.0000 mL | Freq: Two times a day (BID) | ORAL | 12 refills | Status: AC

## 2019-11-10 NOTE — Discharge Summary (Addendum)
Physician Discharge Summary  BIRDA DIDONATO GGE:[id number] DOB: 11-28-1933 DOA: 11/04/2019  PCP: Aretta Nip, MD  Admit date: 11/04/2019 Discharge date: 11/10/2019  Admitted From: Home with hospice Disposition: Home with hospice  Recommendations for Outpatient Follow-up:  1. None. Per hospice.  Home Health: None Equipment/Devices: None  Discharge Condition: Guarded CODE STATUS: DNR Diet recommendation: Heart healthy/low sodium   Brief/Interim Summary:  Admission HPI written by Rise Patience, MD   Chief Complaint: Increasing weakness poor appetite.  HPI: [name] is a 86 y.o. female with history of A. fib, chronic kidney disease stage IV, chronic diastolic CHF, diabetes mellitus type 2 has been recently doing poorly with poor appetite eating very less becoming more weak and confused.  Recently was seen by cardiologist Dr. Tamala Julian at that time patient's spironolactone was discontinued because of worsening renal function.  Dr. Tamala Julian also recommended hospice consult and hospice team had visited patient today and was found to be very weak and was advised to come to the ER.  ED Course: In the ER patient appears generally weak with increasing lower extremity swelling and warmth which has been ongoing for last few days and also was present when cardiology was evaluating the patient last week.  In the ER patient was in A. fib with RVR for which patient was started on Cardizem infusion.  There was concern for sepsis and empiric antibiotic was started source could be from left lower extremity cellulitis.  Patient labs significant for hyperkalemia of 7.5 with worsening renal function with creatinine of 3.3 mild leukocytosis albumin was 2.6 Covid test was negative.  Patient was started on Cardizem infusion empiric antibiotics admitted for possible sepsis with worsening renal function and dehydration.  For the hyperkalemia patient was given Lokelma IV bicarbonate calcium  gluconate D50 and insulin.  INR was 8.3.   Hospital course:  Sepsis Present on admission. Secondary to proteus bacteremia of unknown source. Treated with Cefazolin and discharged on Keflex to complete 14 days of antibiotics.  Proteus bacteremia As mentioned above.  Severe azotemia Has improved with IV fluids and improvement of obstructive uropathy. Does not appear to be causing mental status change at this time. Trended down prior to discharge  AKI on CKD stage IV In setting of obstructive uropathy. Improving with foley catheter  Possible left lower lobe pneumonia Ruled out as unlikely.  Hyperkalemia Secondary to AKI which is now resolved  Hypokalemia Repleted as needed.  Obstructive oropathy Foley catheter in place.  Chronic diastolic heart failure No exacerbation with initial dehydration. Resume home Lasix but changed to prn dosing.  Chronic atrial fibrillation with RVR RVR resolved. Continue Cardizem  Diabetes mellitus, type 2 SSI while inpatient. Continue Prandin on discharge.  Severe malnutrition Diet has been liberalized to allow greater variety of food.  Left foot wound Wash left foot wound with soap and water. Pat dry. Place a hydrocolloid dressing Kellie Simmering [phone]) over the wound.  Change daily. Monitor the wound area(s) for worsening of condition such as: Signs/symptoms of infection,  Increase in size,  Development of or worsening of odor, Development of pain, or increased pain at the affected locations.  Notify the medical team if any of these develop.  Coumadin induced coagulopathy No bleeding. Anticoagulation discontinued secondary to hospice status. INR trending down prior to discharge.   Discharge Diagnoses:  Principal Problem:   Sepsis ([place]) Active Problems:   Atrial fibrillation ([facility])   Chronic kidney disease, stage IV (severe) ([place])   Diabetes  mellitus with renal complications ([place])   Long term current use of anticoagulant  therapy   Chronic diastolic heart failure (H[facility])   Anemia due to blood loss, chronic   AKI (acute kidney injury) ([place])   Palliative care by specialist   Goals of care, counseling/discussion   DNR (do not resuscitate)   Other chronic pain   Malnutrition of moderate degree    Discharge Instructions  Discharge Instructions    Diet - low sodium heart healthy   Complete by: As directed    Discharge wound care:   Complete by: As directed    Wash left foot wound with soap and water. Pat dry. Place a hydrocolloid dressing Kellie Simmering [phone]) over the wound.  Change daily.     Allergies as of 11/10/2019      Reactions   Lotensin [benazepril Hcl] Anaphylaxis   Zantac [ranitidine Hcl] Anaphylaxis   Tape Itching, Rash   Please use paper tape      Medication List    STOP taking these medications   AZO-CRANBERRY PO   metolazone 2.5 MG tablet Commonly known as: ZAROXOLYN   potassium chloride 10 MEQ tablet Commonly known as: KLOR-CON   warfarin 5 MG tablet Commonly known as: COUMADIN     TAKE these medications   acetaminophen 500 MG tablet Commonly known as: TYLENOL Take 1,000 mg by mouth every 6 (six) hours as needed (pain).   ALIGN PO Take 1 capsule by mouth daily as needed (GI issues).   atorvastatin 40 MG tablet Commonly known as: LIPITOR Take 40 mg by mouth daily.   B-D UF III MINI PEN NEEDLES 31G X 5 MM Misc Generic drug: Insulin Pen Needle USE 3 PEN NEEDLES PER DAY   cephALEXin 500 MG capsule Commonly known as: KEFLEX Take 1 capsule (500 mg total) by mouth 2 (two) times daily for 7 days.   cetirizine 10 MG tablet Commonly known as: ZYRTEC Take 10 mg by mouth daily as needed (seasonal allergies).   diltiazem 180 MG 24 hr capsule Commonly known as: CARDIZEM CD TAKE 1 CAPSULE BY MOUTH EVERY DAY What changed: how much to take   econazole nitrate 1 % cream Apply 1 application topically daily as needed (groin rash).   esomeprazole 20 MG capsule Commonly  known as: NEXIUM Take 20 mg by mouth daily as needed (indigestion/heartburn).   feeding supplement (ENSURE ENLIVE) Liqd Take 237 mLs by mouth 2 (two) times daily between meals.   ferrous sulfate 325 (65 FE) MG tablet Take 325 mg by mouth 2 (two) times daily with a meal.   fluticasone 50 MCG/ACT nasal spray Commonly known as: FLONASE Place 2 sprays into both nostrils daily as needed (seasonal allergies).   furosemide 80 MG tablet Commonly known as: LASIX Take 1 tablet (80 mg total) by mouth daily as needed (weight gain of 5lbs). What changed:   how much to take  when to take this  reasons to take this   metoprolol tartrate 100 MG tablet Commonly known as: LOPRESSOR TAKE 1 TABLET BY MOUTH TWICE A DAY   multivitamin with minerals Tabs tablet Take 1 tablet by mouth daily. Centrum Silver   OneTouch Verio test strip Generic drug: glucose blood USE 1 STRIP TO CHECK GLUCOSE TWICE DAILY AS DIRECTED   polyethylene glycol 17 g packet Commonly known as: MIRALAX / GLYCOLAX Take 17 g by mouth daily as needed for mild constipation.   repaglinide 0.5 MG tablet Commonly known as: PRANDIN TAKE 1 TABLET BY MOUTH  BEFORE BREAKFAST AND 2 BEFORE SUPPER What changed: additional instructions            Discharge Care Instructions  (From admission, onward)         Start     Ordered   11/10/19 0000  Discharge wound care:       Comments: Wash left foot wound with soap and water. Pat dry. Place a hydrocolloid dressing Kellie Simmering [phone]) over the wound.  Change daily.   11/10/19 1517          Allergies  Allergen Reactions  . Lotensin [Benazepril Hcl] Anaphylaxis  . Zantac [Ranitidine Hcl] Anaphylaxis  . Tape Itching and Rash    Please use paper tape    Consultations:  Palliative care medicine   Procedures/Studies: US Renal  Result Date: 11/04/2019 CLINICAL DATA:  Acute kidney injury EXAM: RENAL / URINARY TRACT ULTRASOUND COMPLETE COMPARISON:  None. FINDINGS: Right  Kidney: Renal measurements: 9.8 x 5.8 x 4 6 cm = volume: 137 mL . Echogenicity within normal limits. Mild pelvic fullness is seen. No shadowing stones are noted. Left Kidney: Renal measurements: 9.7 x 6.5 x 9.7 cm = volume: 137 mL. Echogenicity within normal limits. Anechoic cyst seen within the lower pole measuring 2.5 x 2 5 x 2.6 cm. Mild pelvic fullness is seen. Bladder: Dilated fluid-filled bladder is noted. The prevoid volume is 1518 mL. The patient is unable to void Other: None. IMPRESSION: Mild bilateral pelvic fullness with a dilated fluid-filled bladder. No shadowing stones. Patient is unable to void. This could be due to neurogenic bladder or bladder outlet obstruction. Electronically Signed   By: Prudencio Pair M.D.   On: 11/04/2019 20:35   DG Chest Port 1 View  Result Date: 11/07/2019 CLINICAL DATA:  Pneumonia EXAM: PORTABLE CHEST 1 VIEW COMPARISON:  November 04, 2019 FINDINGS: Left lower lobe airspace opacity remains without appreciable change. Small left pleural effusion is stable. Lungs elsewhere are clear. Heart is mildly enlarged with pulmonary vascularity normal. No adenopathy. There is aortic atherosclerosis. No bone lesions IMPRESSION: Persistent left lower lobe airspace opacity with small left pleural effusion. Suspect pneumonia left base. Stable cardiac enlargement. No new opacity evident. Aortic Atherosclerosis (ICD10-I70.0). Electronically Signed   By: Lowella Grip III M.D.   On: 11/07/2019 07:56   DG Chest Port 1 View  Result Date: 11/04/2019 CLINICAL DATA:  Question sepsis EXAM: PORTABLE CHEST 1 VIEW COMPARISON:  08/13/2018 FINDINGS: Cardiomegaly. Mild vascular congestion. Left lower lobe airspace opacity with small left effusion. No confluent opacity on the right. No acute bony abnormality. IMPRESSION: Small left pleural effusion with left lower lobe airspace opacity concerning for pneumonia. Cardiomegaly, vascular congestion. Electronically Signed   By: Rolm Baptise M.D.   On:  11/04/2019 18:10   CT RENAL STONE STUDY  Result Date: 11/05/2019 CLINICAL DATA:  Flank pain EXAM: CT ABDOMEN AND PELVIS WITHOUT CONTRAST TECHNIQUE: Multidetector CT imaging of the abdomen and pelvis was performed following the standard protocol without IV contrast. COMPARISON:  Ultrasound of the kidneys from the previous day, chest x-ray from the previous day, CT from 12/09/2012. FINDINGS: Lower chest: Bilateral small effusions are noted. Left lower lobe consolidation is noted stable when compared with a chest x-ray from the previous day. Heavy coronary calcifications are noted. Stable cardiomegaly is noted. Hepatobiliary: Gallbladder is well distended with dependent gallstones. Small cyst is noted within the inferior right lobe of the liver stable from the prior exam. Mild periportal edema is noted. This may represent some underlying hepatitis.  Correlation with laboratory values is recommended. Pancreas: Unremarkable. No pancreatic ductal dilatation or surrounding inflammatory changes. Spleen: Normal in size without focal abnormality. Adrenals/Urinary Tract: Adrenal glands are stable in appearance. Renal cystic changes noted. No renal calculi or obstructive changes are noted. The bladder is well distended. Stomach/Bowel: Mild retained fecal material is identified. No obstructive or inflammatory changes of the colon are seen. The appendix is not well visualized although no inflammatory changes to suggest appendicitis are seen. No obstructive or inflammatory changes of the small bowel or stomach are noted. Some laxity of the anterior abdominal wall is noted without evidence of herniation. Previously seen hernia in 2014 as resolved likely related to prior surgery. Vascular/Lymphatic: Aortic atherosclerosis. No enlarged abdominal or pelvic lymph nodes. Reproductive: Multiple calcified uterine fibroids are noted. No other focal abnormality in the uterus is seen. No adnexal abnormality is noted. Other: No abdominal  wall hernia or abnormality. No abdominopelvic ascites. Musculoskeletal: Degenerative changes of lumbar spine are noted. Chronic L2 compression deformity is noted. IMPRESSION: Left lower lobe consolidation and bilateral small effusions. Periportal edema which may represent some underlying hepatic inflammatory change. Correlation with laboratory values is recommended. No renal stones are identified. Previously seen fullness of the collecting systems has resolved in the interval. The bladder is well distended. Chronic changes as described above. Electronically Signed   By: Inez Catalina M.D.   On: 11/05/2019 06:10      Subjective: No issues.  Discharge Exam: Vitals:   11/10/19 0736 11/10/19 1418  BP: 127/71 128/81  Pulse: (!) 128 (!) 118  Resp: (!) 23 (!) 24  Temp: 98.6 F (37 C) 98.6 F (37 C)  SpO2: 99% 99%   Vitals:   11/09/19 1651 11/09/19 2111 11/10/19 0736 11/10/19 1418  BP: 121/61 123/60 127/71 128/81  Pulse:  (!) 104 (!) 128 (!) 118  Resp:  19 (!) 23 (!) 24  Temp: 99 F (37.2 C) 98.3 F (36.8 C) 98.6 F (37 C) 98.6 F (37 C)  TempSrc: Oral Oral Oral Oral  SpO2:  97% 99% 99%    General: Pt is alert, awake, not in acute distress Cardiovascular: RRR, S1/S2 +, no rubs, no gallops Respiratory: CTA bilaterally, no wheezing, no rhonchi Abdominal: Soft, NT, ND, bowel sounds + Extremities: BLE edema, no cyanosis    The results of significant diagnostics from this hospitalization (including imaging, microbiology, ancillary and laboratory) are listed below for reference.     Microbiology: Recent Results (from the past 240 hour(s))  Blood culture (routine single)     Status: Abnormal   Collection Time: 11/04/19  5:15 PM   Specimen: BLOOD  Result Value Ref Range Status   Specimen Description BLOOD LEFT ANTECUBITAL  Final   Special Requests   Final    BOTTLES DRAWN AEROBIC AND ANAEROBIC Blood Culture adequate volume   Culture  Setup Time   Final    GRAM NEGATIVE RODS IN  BOTH AEROBIC AND ANAEROBIC BOTTLES CRITICAL VALUE NOTED.  VALUE IS CONSISTENT WITH PREVIOUSLY REPORTED AND CALLED VALUE.    Culture (A)  Final    PROTEUS MIRABILIS SUSCEPTIBILITIES PERFORMED ON PREVIOUS CULTURE WITHIN THE LAST 5 DAYS. Performed at [facility], [place] [address] [place], [place] [address]    Report Status 11/07/2019 FINAL  Final  Culture, blood (Routine X 2) w Reflex to ID Panel     Status: Abnormal   Collection Time: 11/04/19  5:34 PM   Specimen: BLOOD  Result Value Ref Range Status  Specimen Description BLOOD RIGHT ANTECUBITAL  Final   Special Requests   Final    BOTTLES DRAWN AEROBIC AND ANAEROBIC Blood Culture results may not be optimal due to an inadequate volume of blood received in culture bottles   Culture  Setup Time   Final    Organism ID to follow IN BOTH AEROBIC AND ANAEROBIC BOTTLES GRAM NEGATIVE RODS CRITICAL RESULT CALLED TO, READ BACK BY AND VERIFIED WITH: Marcelino Freestone [id number] MLM Performed at [facility], [place] [address], [place], [place] [address]    Culture PROTEUS MIRABILIS (A)  Final   Report Status 11/07/2019 FINAL  Final   Organism ID, Bacteria PROTEUS MIRABILIS  Final      Susceptibility   Proteus mirabilis - MIC*    AMPICILLIN <=2 SENSITIVE Sensitive     CEFAZOLIN <=4 SENSITIVE Sensitive     CEFEPIME <=0.12 SENSITIVE Sensitive     CEFTAZIDIME <=1 SENSITIVE Sensitive     CEFTRIAXONE <=0.25 SENSITIVE Sensitive     CIPROFLOXACIN <=0.25 SENSITIVE Sensitive     GENTAMICIN <=1 SENSITIVE Sensitive     IMIPENEM 2 SENSITIVE Sensitive     TRIMETH/SULFA <=20 SENSITIVE Sensitive     AMPICILLIN/SULBACTAM <=2 SENSITIVE Sensitive     PIP/TAZO <=4 SENSITIVE Sensitive     * PROTEUS MIRABILIS  Blood Culture ID Panel (Reflexed)     Status: Abnormal   Collection Time: 11/04/19  5:34 PM  Result Value Ref Range Status   Enterococcus faecalis NOT DETECTED NOT DETECTED Final   Enterococcus Faecium NOT DETECTED NOT DETECTED Final    Listeria monocytogenes NOT DETECTED NOT DETECTED Final   Staphylococcus species NOT DETECTED NOT DETECTED Final   Staphylococcus aureus (BCID) NOT DETECTED NOT DETECTED Final   Staphylococcus epidermidis NOT DETECTED NOT DETECTED Final   Staphylococcus lugdunensis NOT DETECTED NOT DETECTED Final   Streptococcus species NOT DETECTED NOT DETECTED Final   Streptococcus agalactiae NOT DETECTED NOT DETECTED Final   Streptococcus pneumoniae NOT DETECTED NOT DETECTED Final   Streptococcus pyogenes NOT DETECTED NOT DETECTED Final   A.calcoaceticus-baumannii NOT DETECTED NOT DETECTED Final   Bacteroides fragilis NOT DETECTED NOT DETECTED Final   Enterobacterales DETECTED (A) NOT DETECTED Final    Comment: Enterobacterales represent a large order of gram negative bacteria, not a single organism. CRITICAL RESULT CALLED TO, READ BACK BY AND VERIFIED WITH: PHARMD M MACIIA [phone] MLM    Enterobacter cloacae complex NOT DETECTED NOT DETECTED Final   Escherichia coli NOT DETECTED NOT DETECTED Final   Klebsiella aerogenes NOT DETECTED NOT DETECTED Final   Klebsiella oxytoca NOT DETECTED NOT DETECTED Final   Klebsiella pneumoniae NOT DETECTED NOT DETECTED Final   Proteus species DETECTED (A) NOT DETECTED Final    Comment: CRITICAL RESULT CALLED TO, READ BACK BY AND VERIFIED WITH: PHARMD M MACIIA [phone] MLM    Salmonella species NOT DETECTED NOT DETECTED Final   Serratia marcescens NOT DETECTED NOT DETECTED Final   Haemophilus influenzae NOT DETECTED NOT DETECTED Final   Neisseria meningitidis NOT DETECTED NOT DETECTED Final   Pseudomonas aeruginosa NOT DETECTED NOT DETECTED Final   Stenotrophomonas maltophilia NOT DETECTED NOT DETECTED Final   Candida albicans NOT DETECTED NOT DETECTED Final   Candida auris NOT DETECTED NOT DETECTED Final   Candida glabrata NOT DETECTED NOT DETECTED Final   Candida krusei NOT DETECTED NOT DETECTED Final   Candida parapsilosis NOT DETECTED NOT DETECTED  Final   Candida tropicalis NOT DETECTED NOT DETECTED Final   Cryptococcus  neoformans/gattii NOT DETECTED NOT DETECTED Final   CTX-M ESBL NOT DETECTED NOT DETECTED Final   Carbapenem resistance IMP NOT DETECTED NOT DETECTED Final   Carbapenem resistance KPC NOT DETECTED NOT DETECTED Final   Carbapenem resistance NDM NOT DETECTED NOT DETECTED Final   Carbapenem resist OXA 48 LIKE NOT DETECTED NOT DETECTED Final   Carbapenem resistance VIM NOT DETECTED NOT DETECTED Final    Comment: Performed at [facility], [place] [address], [place], [place] [address]  SARS Coronavirus 2 by RT PCR (hospital order, performed in [facility] hospital lab) Nasopharyngeal Nasopharyngeal Swab     Status: None   Collection Time: 11/04/19  9:17 PM   Specimen: Nasopharyngeal Swab  Result Value Ref Range Status   SARS Coronavirus 2 NEGATIVE NEGATIVE Final    Comment: (NOTE) SARS-CoV-2 target nucleic acids are NOT DETECTED.  The SARS-CoV-2 RNA is generally detectable in upper and lower respiratory specimens during the acute phase of infection. The lowest concentration of SARS-CoV-2 viral copies this assay can detect is 250 copies / mL. A negative result does not preclude SARS-CoV-2 infection and should not be used as the sole basis for treatment or other patient management decisions.  A negative result may occur with improper specimen collection / handling, submission of specimen other than nasopharyngeal swab, presence of viral mutation(s) within the areas targeted by this assay, and inadequate number of viral copies (<250 copies / mL). A negative result must be combined with clinical observations, patient history, and epidemiological information.  Fact Sheet for Patients:   StrictlyIdeas.no  Fact Sheet for Healthcare Providers: BankingDealers.co.za  This test is not yet approved or  cleared by the [place] FDA and has been authorized for detection  and/or diagnosis of SARS-CoV-2 by FDA under an Emergency Use Authorization (EUA).  This EUA will remain in effect (meaning this test can be used) for the duration of the COVID-19 declaration under Section 564(b)(1) of the Act, 21 U.S.C. section 360bbb-3(b)(1), unless the authorization is terminated or revoked sooner.  Performed at [facility], [place] [address] [place], [place] [address]      Labs: BNP (last 3 results) No results for input(s): BNP in the last 8760 hours. Basic Metabolic Panel: Recent Labs  Lab 11/06/19 0422 11/07/19 0121 11/08/19 0101 11/09/19 0531 11/10/19 1324  NA 140 140 137 136 134*  K 3.6 3.5 3.2* 2.8* 4.1  CL 105 104 105 104 103  CO2 21* 23 21* 23 19*  GLUCOSE 213* 301* 408* 227* 299*  BUN 125* 126* 110* 99* 90*  CREATININE 2.71* 2.69* 2.33* 1.94* 1.85*  CALCIUM 8.7* 8.2* 7.8* 8.3* 8.4*  MG 2.2  --   --   --   --    Liver Function Tests: Recent Labs  Lab 11/04/19 1720 11/05/19 0231 11/06/19 0422 11/07/19 0121 11/08/19 0101  AST 25 29 21 18 18   ALT 18 14 18 18 18   ALKPHOS 52 47 43 42 48  BILITOT 1.2 1.3* 0.6 0.7 0.3  PROT 6.8 5.8* 5.3* 5.3* 4.9*  ALBUMIN 2.6* 2.1* 2.0* 1.8* 1.7*   No results for input(s): LIPASE, AMYLASE in the last 168 hours. No results for input(s): AMMONIA in the last 168 hours. CBC: Recent Labs  Lab 11/04/19 1720 11/04/19 1720 11/05/19 0231 11/06/19 0422 11/07/19 0121 11/08/19 0101 11/09/19 0531  WBC 17.1*   < > 21.2* 15.6* 14.0* 13.6* 14.1*  NEUTROABS 14.3*  --  18.0*  --   --   --   --  HGB 13.6   < > 12.8 11.4* 10.8* 10.6* 10.7*  HCT 43.5   < > 42.9 35.7* 35.0* 34.1* 34.5*  MCV 87.2   < > 91.7 87.1 87.7 87.2 86.5  PLT 334   < > 269 277 276 262 251   < > = values in this interval not displayed.   Cardiac Enzymes: No results for input(s): CKTOTAL, CKMB, CKMBINDEX, TROPONINI in the last 168 hours. BNP: Invalid input(s): POCBNP CBG: Recent Labs  Lab 11/09/19 1516 11/09/19 2119 11/10/19 0733  11/10/19 1138 11/10/19 1651  GLUCAP 405* 268* 255* 269* 211*   D-Dimer No results for input(s): DDIMER in the last 72 hours. Hgb A1c No results for input(s): HGBA1C in the last 72 hours. Lipid Profile No results for input(s): CHOL, HDL, LDLCALC, TRIG, CHOLHDL, LDLDIRECT in the last 72 hours. Thyroid function studies No results for input(s): TSH, T4TOTAL, T3FREE, THYROIDAB in the last 72 hours.  Invalid input(s): FREET3 Anemia work up No results for input(s): VITAMINB12, FOLATE, FERRITIN, TIBC, IRON, RETICCTPCT in the last 72 hours. Urinalysis    Component Value Date/Time   COLORURINE YELLOW 11/04/2019 2201   APPEARANCEUR CLEAR 11/04/2019 2201   LABSPEC 1.011 11/04/2019 2201   PHURINE 5.0 11/04/2019 2201   GLUCOSEU 50 (A) 11/04/2019 2201   GLUCOSEU NEGATIVE 09/11/2015 1041   HGBUR SMALL (A) 11/04/2019 2201   BILIRUBINUR NEGATIVE 11/04/2019 2201   BILIRUBINUR Neg 07/27/2014 1106   KETONESUR NEGATIVE 11/04/2019 2201   PROTEINUR 30 (A) 11/04/2019 2201   UROBILINOGEN 0.2 09/11/2015 1041   NITRITE NEGATIVE 11/04/2019 2201   LEUKOCYTESUR NEGATIVE 11/04/2019 2201   Sepsis Labs Invalid input(s): PROCALCITONIN,  WBC,  LACTICIDVEN Microbiology Recent Results (from the past 240 hour(s))  Blood culture (routine single)     Status: Abnormal   Collection Time: 11/04/19  5:15 PM   Specimen: BLOOD  Result Value Ref Range Status   Specimen Description BLOOD LEFT ANTECUBITAL  Final   Special Requests   Final    BOTTLES DRAWN AEROBIC AND ANAEROBIC Blood Culture adequate volume   Culture  Setup Time   Final    GRAM NEGATIVE RODS IN BOTH AEROBIC AND ANAEROBIC BOTTLES CRITICAL VALUE NOTED.  VALUE IS CONSISTENT WITH PREVIOUSLY REPORTED AND CALLED VALUE.    Culture (A)  Final    PROTEUS MIRABILIS SUSCEPTIBILITIES PERFORMED ON PREVIOUS CULTURE WITHIN THE LAST 5 DAYS. Performed at [facility], [place] [address] [place], [place] [address]    Report Status 11/07/2019 FINAL  Final   Culture, blood (Routine X 2) w Reflex to ID Panel     Status: Abnormal   Collection Time: 11/04/19  5:34 PM   Specimen: BLOOD  Result Value Ref Range Status   Specimen Description BLOOD RIGHT ANTECUBITAL  Final   Special Requests   Final    BOTTLES DRAWN AEROBIC AND ANAEROBIC Blood Culture results may not be optimal due to an inadequate volume of blood received in culture bottles   Culture  Setup Time   Final    Organism ID to follow IN BOTH AEROBIC AND ANAEROBIC BOTTLES GRAM NEGATIVE RODS CRITICAL RESULT CALLED TO, READ BACK BY AND VERIFIED WITH: Marcelino Freestone [phone] Performed at [facility], [place] [address] [place], [place] [address]    Culture PROTEUS MIRABILIS (A)  Final   Report Status 11/07/2019 FINAL  Final   Organism ID, Bacteria PROTEUS MIRABILIS  Final      Susceptibility   Proteus mirabilis - MIC*  AMPICILLIN <=2 SENSITIVE Sensitive     CEFAZOLIN <=4 SENSITIVE Sensitive     CEFEPIME <=0.12 SENSITIVE Sensitive     CEFTAZIDIME <=1 SENSITIVE Sensitive     CEFTRIAXONE <=0.25 SENSITIVE Sensitive     CIPROFLOXACIN <=0.25 SENSITIVE Sensitive     GENTAMICIN <=1 SENSITIVE Sensitive     IMIPENEM 2 SENSITIVE Sensitive     TRIMETH/SULFA <=20 SENSITIVE Sensitive     AMPICILLIN/SULBACTAM <=2 SENSITIVE Sensitive     PIP/TAZO <=4 SENSITIVE Sensitive     * PROTEUS MIRABILIS  Blood Culture ID Panel (Reflexed)     Status: Abnormal   Collection Time: 11/04/19  5:34 PM  Result Value Ref Range Status   Enterococcus faecalis NOT DETECTED NOT DETECTED Final   Enterococcus Faecium NOT DETECTED NOT DETECTED Final   Listeria monocytogenes NOT DETECTED NOT DETECTED Final   Staphylococcus species NOT DETECTED NOT DETECTED Final   Staphylococcus aureus (BCID) NOT DETECTED NOT DETECTED Final   Staphylococcus epidermidis NOT DETECTED NOT DETECTED Final   Staphylococcus lugdunensis NOT DETECTED NOT DETECTED Final   Streptococcus species NOT DETECTED NOT DETECTED Final    Streptococcus agalactiae NOT DETECTED NOT DETECTED Final   Streptococcus pneumoniae NOT DETECTED NOT DETECTED Final   Streptococcus pyogenes NOT DETECTED NOT DETECTED Final   A.calcoaceticus-baumannii NOT DETECTED NOT DETECTED Final   Bacteroides fragilis NOT DETECTED NOT DETECTED Final   Enterobacterales DETECTED (A) NOT DETECTED Final    Comment: Enterobacterales represent a large order of gram negative bacteria, not a single organism. CRITICAL RESULT CALLED TO, READ BACK BY AND VERIFIED WITH: PHARMD M MACIIA [phone] MLM    Enterobacter cloacae complex NOT DETECTED NOT DETECTED Final   Escherichia coli NOT DETECTED NOT DETECTED Final   Klebsiella aerogenes NOT DETECTED NOT DETECTED Final   Klebsiella oxytoca NOT DETECTED NOT DETECTED Final   Klebsiella pneumoniae NOT DETECTED NOT DETECTED Final   Proteus species DETECTED (A) NOT DETECTED Final    Comment: CRITICAL RESULT CALLED TO, READ BACK BY AND VERIFIED WITH: PHARMD M MACIIA [phone] MLM    Salmonella species NOT DETECTED NOT DETECTED Final   Serratia marcescens NOT DETECTED NOT DETECTED Final   Haemophilus influenzae NOT DETECTED NOT DETECTED Final   Neisseria meningitidis NOT DETECTED NOT DETECTED Final   Pseudomonas aeruginosa NOT DETECTED NOT DETECTED Final   Stenotrophomonas maltophilia NOT DETECTED NOT DETECTED Final   Candida albicans NOT DETECTED NOT DETECTED Final   Candida auris NOT DETECTED NOT DETECTED Final   Candida glabrata NOT DETECTED NOT DETECTED Final   Candida krusei NOT DETECTED NOT DETECTED Final   Candida parapsilosis NOT DETECTED NOT DETECTED Final   Candida tropicalis NOT DETECTED NOT DETECTED Final   Cryptococcus neoformans/gattii NOT DETECTED NOT DETECTED Final   CTX-M ESBL NOT DETECTED NOT DETECTED Final   Carbapenem resistance IMP NOT DETECTED NOT DETECTED Final   Carbapenem resistance KPC NOT DETECTED NOT DETECTED Final   Carbapenem resistance NDM NOT DETECTED NOT DETECTED Final    Carbapenem resist OXA 48 LIKE NOT DETECTED NOT DETECTED Final   Carbapenem resistance VIM NOT DETECTED NOT DETECTED Final    Comment: Performed at [facility], [address], [place], [place] [address]  SARS Coronavirus 2 by RT PCR (hospital order, performed in [facility] hospital lab) Nasopharyngeal Nasopharyngeal Swab     Status: None   Collection Time: 11/04/19  9:17 PM   Specimen: Nasopharyngeal Swab  Result Value Ref Range Status   SARS Coronavirus 2 NEGATIVE NEGATIVE Final  Comment: (NOTE) SARS-CoV-2 target nucleic acids are NOT DETECTED.  The SARS-CoV-2 RNA is generally detectable in upper and lower respiratory specimens during the acute phase of infection. The lowest concentration of SARS-CoV-2 viral copies this assay can detect is 250 copies / mL. A negative result does not preclude SARS-CoV-2 infection and should not be used as the sole basis for treatment or other patient management decisions.  A negative result may occur with improper specimen collection / handling, submission of specimen other than nasopharyngeal swab, presence of viral mutation(s) within the areas targeted by this assay, and inadequate number of viral copies (<250 copies / mL). A negative result must be combined with clinical observations, patient history, and epidemiological information.  Fact Sheet for Patients:   StrictlyIdeas.no  Fact Sheet for Healthcare Providers: BankingDealers.co.za  This test is not yet approved or  cleared by the [place] FDA and has been authorized for detection and/or diagnosis of SARS-CoV-2 by FDA under an Emergency Use Authorization (EUA).  This EUA will remain in effect (meaning this test can be used) for the duration of the COVID-19 declaration under Section 564(b)(1) of the Act, 21 U.S.C. section 360bbb-3(b)(1), unless the authorization is terminated or revoked sooner.  Performed at [facility], [place] [address] [place], [place] [address]      Time coordinating discharge: 35 minutes  SIGNED:   Cordelia Poche, MD [facility] 11/10/2019, 5:29 PM

## 2019-11-10 NOTE — Plan of Care (Signed)

## 2019-11-10 NOTE — Consult Note (Signed)
[place] Nurse Consult Note: Patient receiving care in [facility] 2W18. Reason for Consult: left foot wound Wound type: likely a ruptured bulla Pressure Injury POA: Yes/No/NA Measurement: 8 cm x 2 cm x no measureable depth Wound bed: pink and brown Drainage (amount, consistency, odor) serosanginous Periwound: intact Dressing procedure/placement/frequency: Wash left foot wound with soap and water. Pat dry. Place a hydrocolloid dressing Kellie Simmering [phone]) over the wound.  Change daily. Monitor the wound area(s) for worsening of condition such as: Signs/symptoms of infection,  Increase in size,  Development of or worsening of odor, Development of pain, or increased pain at the affected locations.  Notify the medical team if any of these develop.  Thank you for the consult.  Discussed plan of care with the patient.  [place] nurse will not follow at this time.  Please re-consult the [facility] team if needed.  Val Riles, RN, MSN, CWOCN, CNS-BC, pager [phone]

## 2019-11-10 NOTE — Progress Notes (Signed)
Inpatient Diabetes Program Recommendations  AACE/ADA: New Consensus Statement on Inpatient Glycemic Control   Target Ranges:  Prepandial:   less than 140 mg/dL      Peak postprandial:   less than 180 mg/dL (1-2 hours)      Critically ill patients:  140 - 180 mg/dL   Results for AYBREE, LANYON (MRN [id number]) as of 11/10/2019 10:51  Ref. Range 11/09/2019 03:22 11/09/2019 09:03 11/09/2019 15:16 11/09/2019 21:19  Glucose-Capillary Latest Ref Range: 70 - 99 mg/dL 215 (H) 229 (H) 405 (H) 268 (H)   Review of Glycemic Control  Diabetes history:DM2 Outpatient Diabetes medications:Prandin 0.5 mg with breakfast, Prandin 1 mg with supper Current orders for Inpatient glycemic control:Novolog 0-15 units TID with meals, Novolog 0-5 units QHS, Prandin 0.5 mg with breakfast, Prandin 1 mg with supper  Inpatient Diabetes Program Recommendations:  Insulin-Please consider ordering Levemir 6 units Q24H (based on 61.2 kg x 0.1 units).  Thanks, Barnie Alderman, RN, MSN, CDE Diabetes Coordinator Inpatient Diabetes Program [phone] (Team Pager from 8am to 5pm)

## 2019-11-10 NOTE — TOC Progression Note (Signed)
Transition of Care [facility]) - Progression Note    Patient Details  Name: [name] MRN: [id number] Date of Birth: [birth year]  [facility] CM/SW Contact  Joanne Chars, LCSW Phone Number: 11/10/2019, 3:01 PM  Clinical Narrative:   Pt stable for discharge.  [facility] at [facility] informed and hospice services and needed equipment in place.  Spoke with Elmyra Ricks, daughter, who is aware as well.    Expected Discharge Plan: Home w Hospice Care Barriers to Discharge: Barriers Resolved  Expected Discharge Plan and Services Expected Discharge Plan: [place] Acute Care Choice: Hospice Living arrangements for the past 2 months: Single Family Home                 DME Arranged:  (by Hospice)         HH Arranged: RN [place] Agency: Hospice and [place] Date [place]: 11/10/19 Time [place]: 1450 Representative spoke with at [place]: [place] ([place]) Interventions    Readmission Risk Interventions No flowsheet data found.

## 2019-11-10 NOTE — Progress Notes (Signed)
Nutrition Follow-up  DOCUMENTATION CODES:   Non-severe (moderate) malnutrition in context of chronic illness  INTERVENTION:  Continue regular diet  Continue Ensure Enlive po BID, each supplement provides 350 kcal and 20 grams of protein  Continue Magic cup TID with meals, each supplement provides 290 kcal and 9 grams of protein  NUTRITION DIAGNOSIS:   Moderate Malnutrition related to chronic illness as evidenced by mild fat depletion, mild muscle depletion, moderate fat depletion, moderate muscle depletion, energy intake < 75% for > or equal to 1 month.  (New diagnosis)  GOAL:   Patient will meet greater than or equal to 90% of their needs  Progressing  MONITOR:   PO intake, Supplement acceptance, Labs, Weight trends, Skin, I & O's  REASON FOR ASSESSMENT:   Consult Poor PO  ASSESSMENT:   [name] is a 86 y.o. female with history of A. fib, chronic kidney disease stage IV, chronic diastolic CHF, diabetes mellitus type 2 has been recently doing poorly with poor appetite eating very less becoming more weak and confused.  Recently was seen by cardiologist Dr. Tamala Julian at that time patient's spironolactone was discontinued because of worsening renal function.  Dr. Tamala Julian also recommended hospice consult and hospice team had visited patient today and was found to be very weak and was advised to come to the ER.  Pt admitted with possible sepsis and acute on chronic stage IV kidney disease.  Pt reports poor appetite and states that her appetite has been extremely poor for over 1 month. Pt states that she has been trying to force herself to eat because she knows it is good for her. Prior to the last month, pt ate 2 large balanced meals per day. Pt is consuming her supplements and would like to continue receiving them.   PO Intake: 5-100% x 5 recorded meals (37% average meal intake)  Labs: K+ 2.8 (L), CBGs [phone] (Diabetes Coordinator following) Medications: Novolog,  Miralax, Prandin  NUTRITION - FOCUSED PHYSICAL EXAM:    Most Recent Value  Orbital Region No depletion  Upper Arm Region Moderate depletion  Thoracic and Lumbar Region Mild depletion  Buccal Region Moderate depletion  Temple Region Moderate depletion  Clavicle Bone Region Mild depletion  Clavicle and Acromion Bone Region Moderate depletion  Scapular Bone Region Moderate depletion  Dorsal Hand Moderate depletion  Patellar Region Moderate depletion  Anterior Thigh Region Moderate depletion  Posterior Calf Region Moderate depletion  Edema (RD Assessment) Moderate  Hair Reviewed  Eyes Reviewed  Mouth Reviewed  Skin Other (Comment)  [skin is dry]  Nails Reviewed       Diet Order:   Diet Order            Diet regular Room service appropriate? Yes with Assist; Fluid consistency: Thin  Diet effective now                 EDUCATION NEEDS:   No education needs have been identified at this time  Skin:  Skin Assessment: Skin Integrity Issues: Skin Integrity Issues:: Other (Comment) Stage II: sacrum Other: blister L foot  Last BM:  8/18 type 7  Height:   Ht Readings from Last 1 Encounters:  10/31/19 5\' 6"  (1.676 m)    Weight:   Wt Readings from Last 1 Encounters:  10/31/19 61.2 kg    Ideal Body Weight:  59.1 kg  BMI:  There is no height or weight on file to calculate BMI.  Estimated Nutritional Needs:   Kcal:  6301-6010  Protein:  75-90 grams  Fluid:  > 1.6 L    Larkin Ina, MS, RD, LDN RD pager number and weekend/on-call pager number located in [place].

## 2019-11-11 ENCOUNTER — Telehealth: Payer: Self-pay | Admitting: *Deleted

## 2019-11-11 NOTE — Telephone Encounter (Signed)
Pt was in the coumadin clinic follow up book. Reviewed chart and saw that pt was recently in the hospital. Per Dr. Theodoro Grist note 8/18, warfarin D/C. Called and spoke to pt's daughter, Elmyra Ricks, who confirmed pt is no longer taking warfarin and is being followed by hospice.  Informed her that pt will not need to have INR checked since pt is no longer taking warfarin. Called and spoke to [facility] who was suppose to check pt's INR on 8/23 and informed them that pt is no longer taking warfarin and will note need to have INR checked on that day.

## 2019-12-09 ENCOUNTER — Other Ambulatory Visit: Payer: Self-pay | Admitting: Interventional Cardiology

## 2019-12-12 ENCOUNTER — Telehealth: Payer: Self-pay | Admitting: Family Medicine

## 2019-12-12 NOTE — Telephone Encounter (Signed)
Patient's sister called and informed our office that this patient has deceased. Patient passed on 2019-12-26 according to her sister Antionette.

## 2019-12-23 DEATH — deceased

## 2019-12-27 ENCOUNTER — Ambulatory Visit: Payer: Medicare Other | Admitting: Podiatry

## 2020-01-10 ENCOUNTER — Other Ambulatory Visit: Payer: Self-pay | Admitting: Interventional Cardiology

## 2020-03-02 ENCOUNTER — Ambulatory Visit: Payer: Medicare Other | Admitting: Interventional Cardiology
